# Patient Record
Sex: Female | Born: 1945 | Race: White | Hispanic: No | Marital: Married | Smoking: Former smoker
Health system: Southern US, Community
[De-identification: ages and names within clinical notes are randomized; demographics above are authoritative.]

## PROBLEM LIST (undated history)

## (undated) DIAGNOSIS — J449 Chronic obstructive pulmonary disease, unspecified: Secondary | ICD-10-CM

## (undated) DIAGNOSIS — E7800 Pure hypercholesterolemia, unspecified: Secondary | ICD-10-CM

## (undated) DIAGNOSIS — Z8719 Personal history of other diseases of the digestive system: Secondary | ICD-10-CM

## (undated) DIAGNOSIS — J302 Other seasonal allergic rhinitis: Secondary | ICD-10-CM

## (undated) DIAGNOSIS — H9319 Tinnitus, unspecified ear: Secondary | ICD-10-CM

## (undated) DIAGNOSIS — E039 Hypothyroidism, unspecified: Secondary | ICD-10-CM

## (undated) DIAGNOSIS — R069 Unspecified abnormalities of breathing: Secondary | ICD-10-CM

## (undated) DIAGNOSIS — I10 Essential (primary) hypertension: Secondary | ICD-10-CM

## (undated) DIAGNOSIS — R42 Dizziness and giddiness: Secondary | ICD-10-CM

## (undated) DIAGNOSIS — F419 Anxiety disorder, unspecified: Secondary | ICD-10-CM

## (undated) DIAGNOSIS — Z972 Presence of dental prosthetic device (complete) (partial): Secondary | ICD-10-CM

## (undated) DIAGNOSIS — J381 Polyp of vocal cord and larynx: Secondary | ICD-10-CM

## (undated) DIAGNOSIS — J439 Emphysema, unspecified: Secondary | ICD-10-CM

## (undated) DIAGNOSIS — K649 Unspecified hemorrhoids: Secondary | ICD-10-CM

## (undated) DIAGNOSIS — C3490 Malignant neoplasm of unspecified part of unspecified bronchus or lung: Secondary | ICD-10-CM

## (undated) DIAGNOSIS — I499 Cardiac arrhythmia, unspecified: Secondary | ICD-10-CM

## (undated) DIAGNOSIS — R002 Palpitations: Secondary | ICD-10-CM

## (undated) HISTORY — DX: Unspecified abnormalities of breathing: R06.9

## (undated) HISTORY — PX: OTHER SURGICAL HISTORY: SHX169

## (undated) HISTORY — DX: Malignant neoplasm of unspecified part of unspecified bronchus or lung: C34.90

## (undated) HISTORY — DX: Other seasonal allergic rhinitis: J30.2

## (undated) HISTORY — DX: Emphysema, unspecified: J43.9

## (undated) HISTORY — PX: ABDOMINAL HYSTERECTOMY: SHX81

---

## 2004-09-21 ENCOUNTER — Ambulatory Visit: Payer: Self-pay | Admitting: *Deleted

## 2005-11-04 ENCOUNTER — Ambulatory Visit: Payer: Self-pay | Admitting: Nurse Practitioner

## 2006-12-16 ENCOUNTER — Ambulatory Visit: Payer: Self-pay | Admitting: Nurse Practitioner

## 2008-01-09 ENCOUNTER — Ambulatory Visit: Payer: Self-pay | Admitting: Family Medicine

## 2008-01-23 ENCOUNTER — Ambulatory Visit: Payer: Self-pay | Admitting: Family Medicine

## 2009-01-21 ENCOUNTER — Ambulatory Visit: Payer: Self-pay | Admitting: Family Medicine

## 2010-02-05 ENCOUNTER — Ambulatory Visit: Payer: Self-pay | Admitting: Family Medicine

## 2011-02-08 ENCOUNTER — Other Ambulatory Visit: Payer: Self-pay | Admitting: Internal Medicine

## 2011-03-15 ENCOUNTER — Ambulatory Visit: Payer: Self-pay | Admitting: Internal Medicine

## 2012-03-15 ENCOUNTER — Ambulatory Visit: Payer: Self-pay | Admitting: Internal Medicine

## 2013-01-31 DIAGNOSIS — D485 Neoplasm of uncertain behavior of skin: Secondary | ICD-10-CM | POA: Diagnosis not present

## 2013-01-31 DIAGNOSIS — D239 Other benign neoplasm of skin, unspecified: Secondary | ICD-10-CM | POA: Diagnosis not present

## 2013-01-31 DIAGNOSIS — Z0189 Encounter for other specified special examinations: Secondary | ICD-10-CM | POA: Diagnosis not present

## 2013-01-31 DIAGNOSIS — L821 Other seborrheic keratosis: Secondary | ICD-10-CM | POA: Diagnosis not present

## 2013-01-31 DIAGNOSIS — D235 Other benign neoplasm of skin of trunk: Secondary | ICD-10-CM | POA: Diagnosis not present

## 2013-01-31 DIAGNOSIS — L819 Disorder of pigmentation, unspecified: Secondary | ICD-10-CM | POA: Diagnosis not present

## 2013-02-12 DIAGNOSIS — Z01419 Encounter for gynecological examination (general) (routine) without abnormal findings: Secondary | ICD-10-CM | POA: Diagnosis not present

## 2013-02-12 DIAGNOSIS — E785 Hyperlipidemia, unspecified: Secondary | ICD-10-CM | POA: Diagnosis not present

## 2013-02-12 DIAGNOSIS — I10 Essential (primary) hypertension: Secondary | ICD-10-CM | POA: Diagnosis not present

## 2013-03-16 ENCOUNTER — Ambulatory Visit: Payer: Self-pay | Admitting: Internal Medicine

## 2013-03-16 DIAGNOSIS — Z1231 Encounter for screening mammogram for malignant neoplasm of breast: Secondary | ICD-10-CM | POA: Diagnosis not present

## 2013-06-05 ENCOUNTER — Emergency Department: Payer: Self-pay | Admitting: Emergency Medicine

## 2013-06-05 DIAGNOSIS — S41109A Unspecified open wound of unspecified upper arm, initial encounter: Secondary | ICD-10-CM | POA: Diagnosis not present

## 2013-06-05 DIAGNOSIS — R079 Chest pain, unspecified: Secondary | ICD-10-CM | POA: Diagnosis not present

## 2013-06-05 DIAGNOSIS — Z043 Encounter for examination and observation following other accident: Secondary | ICD-10-CM | POA: Diagnosis not present

## 2013-06-05 DIAGNOSIS — S61409A Unspecified open wound of unspecified hand, initial encounter: Secondary | ICD-10-CM | POA: Diagnosis not present

## 2013-06-05 DIAGNOSIS — M7989 Other specified soft tissue disorders: Secondary | ICD-10-CM | POA: Diagnosis not present

## 2013-06-05 DIAGNOSIS — M79609 Pain in unspecified limb: Secondary | ICD-10-CM | POA: Diagnosis not present

## 2013-06-05 LAB — BASIC METABOLIC PANEL
Anion Gap: 9 (ref 7–16)
Co2: 26 mmol/L (ref 21–32)
EGFR (African American): >60
Osmolality: 263 (ref 275–301)
Potassium: 3 mmol/L — ABNORMAL LOW (ref 3.5–5.1)
Sodium: 131 mmol/L — ABNORMAL LOW (ref 136–145)

## 2013-06-05 LAB — CBC
HCT: 40 % (ref 35.0–47.0)
MCH: 32 pg (ref 26.0–34.0)
MCHC: 34.6 g/dL (ref 32.0–36.0)
Platelet: 303 10*3/uL (ref 150–440)
RBC: 4.33 10*6/uL (ref 3.80–5.20)
RDW: 14 % (ref 11.5–14.5)

## 2013-06-07 DIAGNOSIS — M25529 Pain in unspecified elbow: Secondary | ICD-10-CM | POA: Diagnosis not present

## 2013-06-14 DIAGNOSIS — S43499A Other sprain of unspecified shoulder joint, initial encounter: Secondary | ICD-10-CM | POA: Diagnosis not present

## 2013-06-14 DIAGNOSIS — Z23 Encounter for immunization: Secondary | ICD-10-CM | POA: Diagnosis not present

## 2013-06-14 DIAGNOSIS — M25519 Pain in unspecified shoulder: Secondary | ICD-10-CM | POA: Diagnosis not present

## 2013-06-14 DIAGNOSIS — IMO0002 Reserved for concepts with insufficient information to code with codable children: Secondary | ICD-10-CM | POA: Diagnosis not present

## 2013-07-13 DIAGNOSIS — IMO0002 Reserved for concepts with insufficient information to code with codable children: Secondary | ICD-10-CM | POA: Diagnosis not present

## 2014-01-24 DIAGNOSIS — R350 Frequency of micturition: Secondary | ICD-10-CM | POA: Diagnosis not present

## 2014-01-24 DIAGNOSIS — N3000 Acute cystitis without hematuria: Secondary | ICD-10-CM | POA: Diagnosis not present

## 2014-03-12 DIAGNOSIS — E785 Hyperlipidemia, unspecified: Secondary | ICD-10-CM | POA: Diagnosis not present

## 2014-03-12 DIAGNOSIS — I10 Essential (primary) hypertension: Secondary | ICD-10-CM | POA: Diagnosis not present

## 2014-03-12 DIAGNOSIS — J019 Acute sinusitis, unspecified: Secondary | ICD-10-CM | POA: Diagnosis not present

## 2014-04-19 ENCOUNTER — Ambulatory Visit: Payer: Self-pay | Admitting: Internal Medicine

## 2014-04-19 DIAGNOSIS — Z1231 Encounter for screening mammogram for malignant neoplasm of breast: Secondary | ICD-10-CM | POA: Diagnosis not present

## 2014-04-22 DIAGNOSIS — H9319 Tinnitus, unspecified ear: Secondary | ICD-10-CM | POA: Diagnosis not present

## 2014-04-22 DIAGNOSIS — H9120 Sudden idiopathic hearing loss, unspecified ear: Secondary | ICD-10-CM | POA: Diagnosis not present

## 2014-04-22 DIAGNOSIS — T65291A Toxic effect of other tobacco and nicotine, accidental (unintentional), initial encounter: Secondary | ICD-10-CM | POA: Diagnosis not present

## 2014-04-22 DIAGNOSIS — R490 Dysphonia: Secondary | ICD-10-CM | POA: Diagnosis not present

## 2014-04-22 DIAGNOSIS — H905 Unspecified sensorineural hearing loss: Secondary | ICD-10-CM | POA: Diagnosis not present

## 2014-04-22 DIAGNOSIS — J381 Polyp of vocal cord and larynx: Secondary | ICD-10-CM | POA: Diagnosis not present

## 2014-04-22 DIAGNOSIS — H6120 Impacted cerumen, unspecified ear: Secondary | ICD-10-CM | POA: Diagnosis not present

## 2014-04-23 DIAGNOSIS — F341 Dysthymic disorder: Secondary | ICD-10-CM | POA: Diagnosis not present

## 2014-04-23 DIAGNOSIS — E785 Hyperlipidemia, unspecified: Secondary | ICD-10-CM | POA: Diagnosis not present

## 2014-04-23 DIAGNOSIS — I10 Essential (primary) hypertension: Secondary | ICD-10-CM | POA: Diagnosis not present

## 2014-05-07 DIAGNOSIS — I10 Essential (primary) hypertension: Secondary | ICD-10-CM | POA: Diagnosis not present

## 2014-05-07 DIAGNOSIS — E785 Hyperlipidemia, unspecified: Secondary | ICD-10-CM | POA: Diagnosis not present

## 2014-05-07 DIAGNOSIS — F17200 Nicotine dependence, unspecified, uncomplicated: Secondary | ICD-10-CM | POA: Diagnosis not present

## 2014-05-13 DIAGNOSIS — R490 Dysphonia: Secondary | ICD-10-CM | POA: Diagnosis not present

## 2014-05-13 DIAGNOSIS — H905 Unspecified sensorineural hearing loss: Secondary | ICD-10-CM | POA: Diagnosis not present

## 2014-05-13 DIAGNOSIS — T65291A Toxic effect of other tobacco and nicotine, accidental (unintentional), initial encounter: Secondary | ICD-10-CM | POA: Diagnosis not present

## 2014-05-13 DIAGNOSIS — H9120 Sudden idiopathic hearing loss, unspecified ear: Secondary | ICD-10-CM | POA: Diagnosis not present

## 2014-05-30 DIAGNOSIS — H66003 Acute suppurative otitis media without spontaneous rupture of ear drum, bilateral: Secondary | ICD-10-CM | POA: Diagnosis not present

## 2014-05-30 DIAGNOSIS — F17200 Nicotine dependence, unspecified, uncomplicated: Secondary | ICD-10-CM | POA: Diagnosis not present

## 2014-05-30 DIAGNOSIS — J302 Other seasonal allergic rhinitis: Secondary | ICD-10-CM | POA: Diagnosis not present

## 2014-06-05 DIAGNOSIS — H903 Sensorineural hearing loss, bilateral: Secondary | ICD-10-CM | POA: Diagnosis not present

## 2014-06-05 DIAGNOSIS — R490 Dysphonia: Secondary | ICD-10-CM | POA: Diagnosis not present

## 2014-06-05 DIAGNOSIS — J309 Allergic rhinitis, unspecified: Secondary | ICD-10-CM | POA: Diagnosis not present

## 2014-06-05 DIAGNOSIS — H6982 Other specified disorders of Eustachian tube, left ear: Secondary | ICD-10-CM | POA: Diagnosis not present

## 2014-06-13 ENCOUNTER — Emergency Department: Payer: Self-pay | Admitting: Emergency Medicine

## 2014-06-13 DIAGNOSIS — Z720 Tobacco use: Secondary | ICD-10-CM | POA: Diagnosis not present

## 2014-06-13 DIAGNOSIS — I10 Essential (primary) hypertension: Secondary | ICD-10-CM | POA: Diagnosis not present

## 2014-06-13 DIAGNOSIS — H9312 Tinnitus, left ear: Secondary | ICD-10-CM | POA: Diagnosis not present

## 2014-06-13 DIAGNOSIS — H6502 Acute serous otitis media, left ear: Secondary | ICD-10-CM | POA: Diagnosis not present

## 2014-06-16 DIAGNOSIS — Z5181 Encounter for therapeutic drug level monitoring: Secondary | ICD-10-CM | POA: Diagnosis not present

## 2014-06-16 DIAGNOSIS — R450 Nervousness: Secondary | ICD-10-CM | POA: Diagnosis not present

## 2014-06-16 DIAGNOSIS — Z79899 Other long term (current) drug therapy: Secondary | ICD-10-CM | POA: Diagnosis not present

## 2014-06-16 DIAGNOSIS — H9313 Tinnitus, bilateral: Secondary | ICD-10-CM | POA: Diagnosis not present

## 2014-06-16 DIAGNOSIS — R000 Tachycardia, unspecified: Secondary | ICD-10-CM | POA: Diagnosis not present

## 2014-06-16 DIAGNOSIS — F419 Anxiety disorder, unspecified: Secondary | ICD-10-CM | POA: Diagnosis not present

## 2014-06-16 DIAGNOSIS — F17200 Nicotine dependence, unspecified, uncomplicated: Secondary | ICD-10-CM | POA: Diagnosis not present

## 2014-06-18 DIAGNOSIS — F17210 Nicotine dependence, cigarettes, uncomplicated: Secondary | ICD-10-CM | POA: Diagnosis not present

## 2014-06-18 DIAGNOSIS — R079 Chest pain, unspecified: Secondary | ICD-10-CM | POA: Diagnosis not present

## 2014-06-18 DIAGNOSIS — I10 Essential (primary) hypertension: Secondary | ICD-10-CM | POA: Diagnosis not present

## 2014-06-18 DIAGNOSIS — H9313 Tinnitus, bilateral: Secondary | ICD-10-CM | POA: Diagnosis not present

## 2014-06-18 DIAGNOSIS — H9319 Tinnitus, unspecified ear: Secondary | ICD-10-CM | POA: Diagnosis not present

## 2014-06-18 DIAGNOSIS — R0789 Other chest pain: Secondary | ICD-10-CM | POA: Diagnosis not present

## 2014-06-24 DIAGNOSIS — H9319 Tinnitus, unspecified ear: Secondary | ICD-10-CM | POA: Diagnosis not present

## 2014-07-01 DIAGNOSIS — H9319 Tinnitus, unspecified ear: Secondary | ICD-10-CM | POA: Diagnosis not present

## 2014-07-01 DIAGNOSIS — R079 Chest pain, unspecified: Secondary | ICD-10-CM | POA: Diagnosis not present

## 2014-07-01 DIAGNOSIS — I10 Essential (primary) hypertension: Secondary | ICD-10-CM | POA: Diagnosis not present

## 2014-07-01 DIAGNOSIS — I471 Supraventricular tachycardia: Secondary | ICD-10-CM | POA: Diagnosis not present

## 2014-07-02 DIAGNOSIS — Z23 Encounter for immunization: Secondary | ICD-10-CM | POA: Diagnosis not present

## 2014-07-15 DIAGNOSIS — F411 Generalized anxiety disorder: Secondary | ICD-10-CM | POA: Diagnosis not present

## 2014-07-15 DIAGNOSIS — H9312 Tinnitus, left ear: Secondary | ICD-10-CM | POA: Diagnosis not present

## 2014-07-24 DIAGNOSIS — I471 Supraventricular tachycardia: Secondary | ICD-10-CM | POA: Diagnosis not present

## 2014-07-24 DIAGNOSIS — I10 Essential (primary) hypertension: Secondary | ICD-10-CM | POA: Diagnosis not present

## 2014-07-24 DIAGNOSIS — H9319 Tinnitus, unspecified ear: Secondary | ICD-10-CM | POA: Diagnosis not present

## 2014-08-28 DIAGNOSIS — I471 Supraventricular tachycardia: Secondary | ICD-10-CM | POA: Diagnosis not present

## 2014-08-28 DIAGNOSIS — I10 Essential (primary) hypertension: Secondary | ICD-10-CM | POA: Diagnosis not present

## 2014-08-28 DIAGNOSIS — H9319 Tinnitus, unspecified ear: Secondary | ICD-10-CM | POA: Diagnosis not present

## 2014-09-11 DIAGNOSIS — F17200 Nicotine dependence, unspecified, uncomplicated: Secondary | ICD-10-CM | POA: Diagnosis not present

## 2014-09-11 DIAGNOSIS — R490 Dysphonia: Secondary | ICD-10-CM | POA: Diagnosis not present

## 2014-09-11 DIAGNOSIS — H903 Sensorineural hearing loss, bilateral: Secondary | ICD-10-CM | POA: Diagnosis not present

## 2015-04-02 DIAGNOSIS — F17210 Nicotine dependence, cigarettes, uncomplicated: Secondary | ICD-10-CM | POA: Diagnosis not present

## 2015-04-02 DIAGNOSIS — R490 Dysphonia: Secondary | ICD-10-CM | POA: Diagnosis not present

## 2015-04-07 DIAGNOSIS — I471 Supraventricular tachycardia: Secondary | ICD-10-CM | POA: Diagnosis not present

## 2015-04-07 DIAGNOSIS — H81319 Aural vertigo, unspecified ear: Secondary | ICD-10-CM | POA: Diagnosis not present

## 2015-04-07 DIAGNOSIS — E784 Other hyperlipidemia: Secondary | ICD-10-CM | POA: Diagnosis not present

## 2015-04-09 ENCOUNTER — Other Ambulatory Visit: Payer: Self-pay | Admitting: Internal Medicine

## 2015-04-09 DIAGNOSIS — Z1231 Encounter for screening mammogram for malignant neoplasm of breast: Secondary | ICD-10-CM

## 2015-04-21 ENCOUNTER — Other Ambulatory Visit: Payer: Self-pay | Admitting: Internal Medicine

## 2015-04-21 ENCOUNTER — Ambulatory Visit
Admission: RE | Admit: 2015-04-21 | Discharge: 2015-04-21 | Disposition: A | Discharge status: Home / Self Care | Payer: Medicare Other | Source: Ambulatory Visit | Attending: Internal Medicine | Admitting: Internal Medicine

## 2015-04-21 DIAGNOSIS — Z1231 Encounter for screening mammogram for malignant neoplasm of breast: Secondary | ICD-10-CM | POA: Insufficient documentation

## 2015-06-13 DIAGNOSIS — Z23 Encounter for immunization: Secondary | ICD-10-CM | POA: Diagnosis not present

## 2015-07-24 DIAGNOSIS — I10 Essential (primary) hypertension: Secondary | ICD-10-CM | POA: Diagnosis not present

## 2015-07-24 DIAGNOSIS — E784 Other hyperlipidemia: Secondary | ICD-10-CM | POA: Diagnosis not present

## 2015-07-24 DIAGNOSIS — R5381 Other malaise: Secondary | ICD-10-CM | POA: Diagnosis not present

## 2015-08-01 DIAGNOSIS — I471 Supraventricular tachycardia: Secondary | ICD-10-CM | POA: Diagnosis not present

## 2015-08-01 DIAGNOSIS — E784 Other hyperlipidemia: Secondary | ICD-10-CM | POA: Diagnosis not present

## 2015-08-01 DIAGNOSIS — I10 Essential (primary) hypertension: Secondary | ICD-10-CM | POA: Diagnosis not present

## 2015-10-13 DIAGNOSIS — R1310 Dysphagia, unspecified: Secondary | ICD-10-CM | POA: Diagnosis not present

## 2015-10-13 DIAGNOSIS — T65222A Toxic effect of tobacco cigarettes, intentional self-harm, initial encounter: Secondary | ICD-10-CM | POA: Diagnosis not present

## 2015-12-31 DIAGNOSIS — I471 Supraventricular tachycardia: Secondary | ICD-10-CM | POA: Diagnosis not present

## 2015-12-31 DIAGNOSIS — E784 Other hyperlipidemia: Secondary | ICD-10-CM | POA: Diagnosis not present

## 2016-01-01 ENCOUNTER — Telehealth: Payer: Self-pay | Admitting: Gastroenterology

## 2016-01-01 NOTE — Telephone Encounter (Signed)
colonoscopy

## 2016-01-08 ENCOUNTER — Other Ambulatory Visit: Payer: Self-pay

## 2016-01-08 MED ORDER — PEG 3350-KCL-NABCB-NACL-NASULF 236 G PO SOLR: 4000.0000 mL | Freq: Once | ORAL | Status: DC

## 2016-01-08 NOTE — Telephone Encounter (Signed)
Pt scheduled for a screening colonoscopy at [facility] on 01/22/16. Instructs/rx mailed. Please precert.

## 2016-01-08 NOTE — Telephone Encounter (Signed)
Gastroenterology Pre-Procedure Review  Request Date: 01/22/16 Requesting Physician: Dr. Lavera Guise  PATIENT REVIEW QUESTIONS: The patient responded to the following health history questions as indicated:    1. Are you having any GI issues? no 2. Do you have a personal history of Polyps? no 3. Do you have a family history of Colon Cancer or Polyps? no 4. Diabetes Mellitus? no 5. Joint replacements in the past 12 months?no 6. Major health problems in the past 3 months?no 7. Any artificial heart valves, MVP, or defibrillator?no    MEDICATIONS & ALLERGIES:    Patient reports the following regarding taking any anticoagulation/antiplatelet therapy:   Plavix, Coumadin, Eliquis, Xarelto, Lovenox, Pradaxa, Brilinta, or Effient? no Aspirin? no  Patient confirms/reports the following medications:  No current outpatient prescriptions on file.   No current facility-administered medications for this visit.    Patient confirms/reports the following allergies:  Allergies not on file  No orders of the defined types were placed in this encounter.    AUTHORIZATION INFORMATION Primary Insurance: 1D#: Group #:  Secondary Insurance: 1D#: Group #:  SCHEDULE INFORMATION: Date: 01/22/16 Time: Location: [facility]

## 2016-01-14 ENCOUNTER — Encounter: Payer: Self-pay | Admitting: *Deleted

## 2016-01-21 ENCOUNTER — Telehealth: Payer: Self-pay | Admitting: Gastroenterology

## 2016-01-21 NOTE — Telephone Encounter (Signed)
Patient has a question regarding taking medication the day of her colonoscopy which is tomorrow. Please call

## 2016-01-21 NOTE — Discharge Instructions (Signed)

## 2016-01-22 ENCOUNTER — Encounter
Admission: RE | Disposition: A | Discharge status: Home / Self Care | Payer: Self-pay | Source: Ambulatory Visit | Attending: Gastroenterology

## 2016-01-22 ENCOUNTER — Ambulatory Visit: Payer: Medicare Other | Admitting: Anesthesiology

## 2016-01-22 ENCOUNTER — Ambulatory Visit
Admission: RE | Admit: 2016-01-22 | Discharge: 2016-01-22 | Disposition: A | Discharge status: Home / Self Care | Payer: Medicare Other | Source: Ambulatory Visit | Attending: Gastroenterology | Admitting: Gastroenterology

## 2016-01-22 DIAGNOSIS — E039 Hypothyroidism, unspecified: Secondary | ICD-10-CM | POA: Insufficient documentation

## 2016-01-22 DIAGNOSIS — E7800 Pure hypercholesterolemia, unspecified: Secondary | ICD-10-CM | POA: Insufficient documentation

## 2016-01-22 DIAGNOSIS — Z79899 Other long term (current) drug therapy: Secondary | ICD-10-CM | POA: Insufficient documentation

## 2016-01-22 DIAGNOSIS — K648 Other hemorrhoids: Secondary | ICD-10-CM | POA: Insufficient documentation

## 2016-01-22 DIAGNOSIS — D120 Benign neoplasm of cecum: Secondary | ICD-10-CM | POA: Diagnosis not present

## 2016-01-22 DIAGNOSIS — K641 Second degree hemorrhoids: Secondary | ICD-10-CM | POA: Insufficient documentation

## 2016-01-22 DIAGNOSIS — D125 Benign neoplasm of sigmoid colon: Secondary | ICD-10-CM | POA: Diagnosis not present

## 2016-01-22 DIAGNOSIS — D122 Benign neoplasm of ascending colon: Secondary | ICD-10-CM | POA: Diagnosis not present

## 2016-01-22 DIAGNOSIS — I499 Cardiac arrhythmia, unspecified: Secondary | ICD-10-CM | POA: Insufficient documentation

## 2016-01-22 DIAGNOSIS — R002 Palpitations: Secondary | ICD-10-CM | POA: Diagnosis not present

## 2016-01-22 DIAGNOSIS — F419 Anxiety disorder, unspecified: Secondary | ICD-10-CM | POA: Diagnosis not present

## 2016-01-22 DIAGNOSIS — Z90710 Acquired absence of both cervix and uterus: Secondary | ICD-10-CM | POA: Diagnosis not present

## 2016-01-22 DIAGNOSIS — I10 Essential (primary) hypertension: Secondary | ICD-10-CM | POA: Diagnosis not present

## 2016-01-22 DIAGNOSIS — K635 Polyp of colon: Secondary | ICD-10-CM | POA: Diagnosis not present

## 2016-01-22 DIAGNOSIS — Z885 Allergy status to narcotic agent status: Secondary | ICD-10-CM | POA: Insufficient documentation

## 2016-01-22 DIAGNOSIS — K5730 Diverticulosis of large intestine without perforation or abscess without bleeding: Secondary | ICD-10-CM | POA: Diagnosis not present

## 2016-01-22 DIAGNOSIS — Z803 Family history of malignant neoplasm of breast: Secondary | ICD-10-CM | POA: Insufficient documentation

## 2016-01-22 DIAGNOSIS — Z1211 Encounter for screening for malignant neoplasm of colon: Secondary | ICD-10-CM | POA: Insufficient documentation

## 2016-01-22 DIAGNOSIS — F17210 Nicotine dependence, cigarettes, uncomplicated: Secondary | ICD-10-CM | POA: Insufficient documentation

## 2016-01-22 HISTORY — PX: POLYPECTOMY: SHX5525

## 2016-01-22 HISTORY — DX: Cardiac arrhythmia, unspecified: I49.9

## 2016-01-22 HISTORY — PX: COLONOSCOPY WITH PROPOFOL: SHX5780

## 2016-01-22 HISTORY — DX: Unspecified hemorrhoids: K64.9

## 2016-01-22 HISTORY — DX: Essential (primary) hypertension: I10

## 2016-01-22 HISTORY — DX: Dizziness and giddiness: R42

## 2016-01-22 HISTORY — DX: Presence of dental prosthetic device (complete) (partial): Z97.2

## 2016-01-22 HISTORY — DX: Pure hypercholesterolemia, unspecified: E78.00

## 2016-01-22 HISTORY — DX: Palpitations: R00.2

## 2016-01-22 HISTORY — DX: Hypothyroidism, unspecified: E03.9

## 2016-01-22 HISTORY — DX: Anxiety disorder, unspecified: F41.9

## 2016-01-22 SURGERY — COLONOSCOPY WITH PROPOFOL
Anesthesia: Monitor Anesthesia Care | Wound class: Contaminated

## 2016-01-22 MED ORDER — LIDOCAINE HCL (CARDIAC) 20 MG/ML IV SOLN
INTRAVENOUS | Status: DC | PRN
  Administered 2016-01-22: 40 mg via INTRAVENOUS

## 2016-01-22 MED ORDER — PROPOFOL 10 MG/ML IV BOLUS
INTRAVENOUS | Status: DC | PRN
  Administered 2016-01-22: 20 mg via INTRAVENOUS
  Administered 2016-01-22: 10 mg via INTRAVENOUS
  Administered 2016-01-22: 20 mg via INTRAVENOUS
  Administered 2016-01-22 (×2): 10 mg via INTRAVENOUS
  Administered 2016-01-22: 20 mg via INTRAVENOUS
  Administered 2016-01-22 (×3): 10 mg via INTRAVENOUS
  Administered 2016-01-22 (×3): 20 mg via INTRAVENOUS
  Administered 2016-01-22 (×2): 10 mg via INTRAVENOUS
  Administered 2016-01-22: 60 mg via INTRAVENOUS
  Administered 2016-01-22 (×2): 20 mg via INTRAVENOUS

## 2016-01-22 MED ORDER — LACTATED RINGERS IV SOLN
INTRAVENOUS | Status: DC
  Administered 2016-01-22 (×2): via INTRAVENOUS

## 2016-01-22 MED ORDER — STERILE WATER FOR IRRIGATION IR SOLN
Status: DC | PRN
  Administered 2016-01-22: 10:00:00

## 2016-01-22 SURGICAL SUPPLY — 23 items
CANISTER SUCT 1200ML W/VALVE (MISCELLANEOUS) ×4 IMPLANT
CLIP HMST 235XBRD CATH ROT (MISCELLANEOUS) ×6 IMPLANT
CLIP RESOLUTION 360 11X235 (MISCELLANEOUS) ×6
FCP ESCP3.2XJMB 240X2.8X (MISCELLANEOUS)
FORCEPS BIOP RAD 4 LRG CAP 4 (CUTTING FORCEPS) IMPLANT
FORCEPS BIOP RJ4 240 W/NDL (MISCELLANEOUS)
FORCEPS ESCP3.2XJMB 240X2.8X (MISCELLANEOUS) IMPLANT
GOWN CVR UNV OPN BCK APRN NK (MISCELLANEOUS) ×4 IMPLANT
GOWN ISOL THUMB LOOP REG UNIV (MISCELLANEOUS) ×4
INJECTOR VARIJECT VIN23 (MISCELLANEOUS) IMPLANT
KIT DEFENDO VALVE AND CONN (KITS) IMPLANT
KIT ENDO PROCEDURE OLY (KITS) ×4 IMPLANT
MARKER SPOT ENDO TATTOO 5ML (MISCELLANEOUS) IMPLANT
PAD GROUND ADULT SPLIT (MISCELLANEOUS) ×4 IMPLANT
PROBE APC STR FIRE (PROBE) IMPLANT
RETRIEVER NET ROTH 2.5X230 LF (MISCELLANEOUS) ×4 IMPLANT
SNARE SHORT THROW 13M SML OVAL (MISCELLANEOUS) ×4 IMPLANT
SNARE SHORT THROW 30M LRG OVAL (MISCELLANEOUS) IMPLANT
SNARE SNG USE RND 15MM (INSTRUMENTS) IMPLANT
SPOT EX ENDOSCOPIC TATTOO (MISCELLANEOUS)
TRAP ETRAP POLY (MISCELLANEOUS) ×4 IMPLANT
VARIJECT INJECTOR VIN23 (MISCELLANEOUS)
WATER STERILE IRR 250ML POUR (IV SOLUTION) ×4 IMPLANT

## 2016-01-22 NOTE — Transfer of Care (Signed)
Immediate Anesthesia Transfer of Care Note  Patient: Beth Mcmillan  Procedure(s) Performed: Procedure(s): COLONOSCOPY WITH PROPOFOL (N/A) POLYPECTOMY  Patient Location: PACU  Anesthesia Type: MAC  Level of Consciousness: awake, alert  and patient cooperative  Airway and Oxygen Therapy: Patient Spontanous Breathing and Patient connected to supplemental oxygen  Post-op Assessment: Post-op Vital signs reviewed, Patient's Cardiovascular Status Stable, Respiratory Function Stable, Patent Airway and No signs of Nausea or vomiting  Post-op Vital Signs: Reviewed and stable  Complications: No apparent anesthesia complications

## 2016-01-22 NOTE — Anesthesia Procedure Notes (Signed)
Procedure Name: MAC Performed by: Yorley Buch Pre-anesthesia Checklist: Patient identified, Emergency Drugs available, Suction available, Patient being monitored and Timeout performed Patient Re-evaluated:Patient Re-evaluated prior to inductionOxygen Delivery Method: Nasal cannula       

## 2016-01-22 NOTE — H&P (Signed)
[facility]  [address] [place], [place] [address] Phone: [phone] Fax : [phone]  Primary Care Physician:  No primary care provider on file. Primary Gastroenterologist:  Dr. Allen Norris  Pre-Procedure History & Physical: HPI:  Beth Mcmillan is a 70 y.o. female is here for a screening colonoscopy.   Past Medical History  Diagnosis Date  . Hypertension   . Dysrhythmia   . Palpitations     with anxiety  . Anxiety   . Hypothyroidism   . Vertigo     no episodes in several years  . Wears dentures     partial lower  . Hypercholesteremia   . Hemorrhoid     Past Surgical History  Procedure Laterality Date  . Abdominal hysterectomy      only had spinal    Prior to Admission medications   Medication Sig Start Date End Date Taking? Authorizing Provider  ALPRAZolam (XANAX) 0.25 MG tablet Take 0.25 mg by mouth 2 (two) times daily as needed for anxiety.   Yes Historical Provider, MD  Ascorbic Acid (VITAMIN C) 500 MG CHEW Chew by mouth.   Yes Historical Provider, MD  atorvastatin (LIPITOR) 10 MG tablet  06/17/14  Yes Historical Provider, MD  benazepril-hydrochlorthiazide (LOTENSIN HCT) 20-12.5 MG tablet Take 1 tablet by mouth daily.   Yes Historical Provider, MD  Cholecalciferol (VITAMIN D PO) Take by mouth daily.   Yes Historical Provider, MD  levothyroxine (SYNTHROID, LEVOTHROID) 50 MCG tablet Take 50 mcg by mouth daily before breakfast.   Yes Historical Provider, MD  metoprolol succinate (TOPROL-XL) 25 MG 24 hr tablet  07/01/14  Yes Historical Provider, MD  VITAMIN E PO Take by mouth daily.   Yes Historical Provider, MD  loratadine (CLARITIN) 10 MG tablet Take by mouth daily as needed. Reported on 01/22/2016    Historical Provider, MD  polyethylene glycol (GOLYTELY) 236 g solution Take 4,000 mLs by mouth once. Drink one 8 oz glass every 30 mins until stools are clear. 01/08/16   Lucilla Lame, MD    Allergies as of 01/08/2016 - Review Complete 01/08/2016    Allergen Reaction Noted  . Codeine Nausea Only 01/08/2016    Family History  Problem Relation Age of Onset  . Breast cancer Neg Hx     Social History   Social History  . Marital Status: Married    Spouse Name: N/A  . Number of Children: N/A  . Years of Education: N/A   Occupational History  . Not on file.   Social History Main Topics  . Smoking status: Current Every Day Smoker -- 0.50 packs/day for 50 years    Types: Cigarettes  . Smokeless tobacco: Not on file  . Alcohol Use: No  . Drug Use: Not on file  . Sexual Activity: Not on file   Other Topics Concern  . Not on file   Social History Narrative    Review of Systems: See HPI, otherwise negative ROS  Physical Exam: BP 148/70 mmHg  Pulse 104  Temp(Src) 98.2 F (36.8 C) (Temporal)  Ht '5\' 7"'$  (1.702 m)  Wt 140 lb (63.504 kg)  BMI 21.92 kg/m2  SpO2 100% General:   Alert,  pleasant and cooperative in NAD Head:  Normocephalic and atraumatic. Neck:  Supple; no masses or thyromegaly. Lungs:  Clear throughout to auscultation.    Heart:  Regular rate and rhythm. Abdomen:  Soft, nontender and nondistended. Normal bowel sounds, without guarding, and without rebound.   Neurologic:  Alert and  oriented x4;  grossly normal neurologically.  Impression/Plan: Beth Mcmillan is now here to undergo a screening colonoscopy.  Risks, benefits, and alternatives regarding colonoscopy have been reviewed with the patient.  Questions have been answered.  All parties agreeable.

## 2016-01-22 NOTE — Anesthesia Preprocedure Evaluation (Signed)
Anesthesia Evaluation  Patient identified by MRN, date of birth, ID band Patient awake    Reviewed: Allergy & Precautions, NPO status , Patient's Chart, lab work & pertinent test results  Airway Mallampati: II  TM Distance: >3 FB Neck ROM: Full    Dental no notable dental hx.    Pulmonary Current Smoker,    Pulmonary exam normal breath sounds clear to auscultation       Cardiovascular hypertension, negative cardio ROS Normal cardiovascular exam Rhythm:Regular Rate:Normal  Anxiety related palpitations.   Neuro/Psych negative neurological ROS  negative psych ROS   GI/Hepatic negative GI ROS, Neg liver ROS,   Endo/Other  Hypothyroidism   Renal/GU negative Renal ROS  negative genitourinary   Musculoskeletal negative musculoskeletal ROS (+)   Abdominal   Peds negative pediatric ROS (+)  Hematology negative hematology ROS (+)   Anesthesia Other Findings   Reproductive/Obstetrics negative OB ROS                             Anesthesia Physical Anesthesia Plan  ASA: II  Anesthesia Plan: MAC   Post-op Pain Management:    Induction: Intravenous  Airway Management Planned:   Additional Equipment:   Intra-op Plan:   Post-operative Plan: Extubation in OR  Informed Consent: I have reviewed the patients History and Physical, chart, labs and discussed the procedure including the risks, benefits and alternatives for the proposed anesthesia with the patient or authorized representative who has indicated his/her understanding and acceptance.   Dental advisory given  Plan Discussed with: CRNA  Anesthesia Plan Comments:         Anesthesia Quick Evaluation

## 2016-01-22 NOTE — Op Note (Signed)
[facility] Gastroenterology Patient Name: Beth Mcmillan Procedure Date: 01/22/2016 8:44 AM MRN: [id number] Account #: [id number] Date of Birth: [birth year] Admit Type: Outpatient Age: 70 Room: [facility] OR ROOM 01 Gender: Female Note Status: Finalized Procedure:            Colonoscopy Indications:          Screening for colorectal malignant neoplasm Providers:            Lucilla Lame, MD Referring MD:         Cletis Athens, MD (Referring MD) Medicines:            Propofol per Anesthesia Complications:        No immediate complications. Procedure:            Pre-Anesthesia Assessment:                       - Prior to the procedure, a History and Physical was                        performed, and patient medications and allergies were                        reviewed. The patient's tolerance of previous                        anesthesia was also reviewed. The risks and benefits of                        the procedure and the sedation options and risks were                        discussed with the patient. All questions were                        answered, and informed consent was obtained. Prior                        Anticoagulants: The patient has taken no previous                        anticoagulant or antiplatelet agents. ASA Grade                        Assessment: II - A patient with mild systemic disease.                        After reviewing the risks and benefits, the patient was                        deemed in satisfactory condition to undergo the                        procedure.                       After obtaining informed consent, the colonoscope was                        passed under direct vision. Throughout the procedure,  the patient's blood pressure, pulse, and oxygen                        saturations were monitored continuously. The Olympus                        CF-HQ190L Colonoscope (S#. [id number]) was introduced               through the anus and advanced to the the cecum,                        identified by appendiceal orifice and ileocecal valve.                        The colonoscopy was performed without difficulty. The                        patient tolerated the procedure well. The quality of                        the bowel preparation was excellent. Findings:      The perianal and digital rectal examinations were normal.      A 4 mm polyp was found in the cecum. The polyp was sessile. The polyp       was removed with a cold snare. Resection and retrieval were complete.      A 10 mm polyp was found in the ascending colon. The polyp was sessile.       The polyp was removed with a hot snare. Resection and retrieval were       complete. To prevent bleeding post-intervention, three hemostatic clips       were successfully placed (MR conditional). There was no bleeding at the       end of the procedure.      A 5 mm polyp was found in the sigmoid colon. The polyp was sessile. The       polyp was removed with a cold snare. Resection and retrieval were       complete.      Three sessile polyps were found in the sigmoid colon. The polyps were 4       to 7 mm in size. These polyps were removed with a cold snare. Resection       and retrieval were complete.      Non-bleeding internal hemorrhoids were found during retroflexion. The       hemorrhoids were Grade II (internal hemorrhoids that prolapse but reduce       spontaneously).      Multiple small-mouthed diverticula were found in the sigmoid colon. Impression:           - One 4 mm polyp in the cecum, removed with a cold                        snare. Resected and retrieved.                       - One 10 mm polyp in the ascending colon, removed with                        a hot snare. Resected and retrieved. Clips (MR  conditional) were placed.                       - One 5 mm polyp in the sigmoid colon, removed with a                         cold snare. Resected and retrieved.                       - Three 4 to 7 mm polyps in the sigmoid colon, removed                        with a cold snare. Resected and retrieved.                       - Non-bleeding internal hemorrhoids.                       - Diverticulosis in the sigmoid colon. Recommendation:       - Await pathology results.                       - Repeat colonoscopy in 3 years if polyp adenoma and 10                        years if hyperplastic Procedure Code(s):    --- Professional ---                       [phone], Colonoscopy, flexible; with removal of tumor(s),                        polyp(s), or other lesion(s) by snare technique Diagnosis Code(s):    --- Professional ---                       Z12.11, Encounter for screening for malignant neoplasm                        of colon                       D12.0, Benign neoplasm of cecum                       D12.2, Benign neoplasm of ascending colon CPT copyright 2016 American Medical Association. All rights reserved. The codes documented in this report are preliminary and upon coder review may  be revised to meet current compliance requirements. Lucilla Lame, MD 01/22/2016 10:11:00 AM This report has been signed electronically. Number of Addenda: 0 Note Initiated On: 01/22/2016 8:44 AM Scope Withdrawal Time: 0 hours 18 minutes 1 second  Total Procedure Duration: 0 hours 25 minutes 47 seconds       [facility]

## 2016-01-22 NOTE — Anesthesia Postprocedure Evaluation (Signed)
Anesthesia Post Note  Patient: Beth Mcmillan  Procedure(s) Performed: Procedure(s) (LRB): COLONOSCOPY WITH PROPOFOL (N/A) POLYPECTOMY  Patient location during evaluation: PACU Anesthesia Type: MAC Level of consciousness: awake and alert Pain management: pain level controlled Vital Signs Assessment: post-procedure vital signs reviewed and stable Respiratory status: spontaneous breathing, nonlabored ventilation, respiratory function stable and patient connected to nasal cannula oxygen Cardiovascular status: stable and blood pressure returned to baseline Anesthetic complications: no    Starasia Sinko C

## 2016-01-23 ENCOUNTER — Encounter: Payer: Self-pay | Admitting: Gastroenterology

## 2016-01-27 ENCOUNTER — Encounter: Payer: Self-pay | Admitting: Gastroenterology

## 2016-01-28 ENCOUNTER — Telehealth: Payer: Self-pay | Admitting: Gastroenterology

## 2016-01-28 NOTE — Telephone Encounter (Signed)
Patient waiting to get colonoscopy results

## 2016-01-28 NOTE — Telephone Encounter (Signed)
Pt notified of colonoscopy results.  

## 2016-03-01 DIAGNOSIS — I471 Supraventricular tachycardia: Secondary | ICD-10-CM | POA: Diagnosis not present

## 2016-03-01 DIAGNOSIS — I10 Essential (primary) hypertension: Secondary | ICD-10-CM | POA: Diagnosis not present

## 2016-03-01 DIAGNOSIS — E784 Other hyperlipidemia: Secondary | ICD-10-CM | POA: Diagnosis not present

## 2016-04-02 ENCOUNTER — Other Ambulatory Visit: Payer: Self-pay | Admitting: Internal Medicine

## 2016-04-02 DIAGNOSIS — Z1231 Encounter for screening mammogram for malignant neoplasm of breast: Secondary | ICD-10-CM

## 2016-04-12 DIAGNOSIS — R1310 Dysphagia, unspecified: Secondary | ICD-10-CM | POA: Diagnosis not present

## 2016-04-12 DIAGNOSIS — T65222A Toxic effect of tobacco cigarettes, intentional self-harm, initial encounter: Secondary | ICD-10-CM | POA: Diagnosis not present

## 2016-04-14 DIAGNOSIS — L821 Other seborrheic keratosis: Secondary | ICD-10-CM | POA: Diagnosis not present

## 2016-04-14 DIAGNOSIS — D485 Neoplasm of uncertain behavior of skin: Secondary | ICD-10-CM | POA: Diagnosis not present

## 2016-04-14 DIAGNOSIS — L280 Lichen simplex chronicus: Secondary | ICD-10-CM | POA: Diagnosis not present

## 2016-04-19 DIAGNOSIS — Z1283 Encounter for screening for malignant neoplasm of skin: Secondary | ICD-10-CM | POA: Diagnosis not present

## 2016-04-19 DIAGNOSIS — L819 Disorder of pigmentation, unspecified: Secondary | ICD-10-CM | POA: Diagnosis not present

## 2016-04-19 DIAGNOSIS — L280 Lichen simplex chronicus: Secondary | ICD-10-CM | POA: Diagnosis not present

## 2016-04-21 DIAGNOSIS — Z23 Encounter for immunization: Secondary | ICD-10-CM | POA: Diagnosis not present

## 2016-04-23 ENCOUNTER — Other Ambulatory Visit: Payer: Self-pay | Admitting: Internal Medicine

## 2016-04-23 ENCOUNTER — Ambulatory Visit
Admission: RE | Admit: 2016-04-23 | Discharge: 2016-04-23 | Disposition: A | Discharge status: Home / Self Care | Payer: Medicare Other | Source: Ambulatory Visit | Attending: Internal Medicine | Admitting: Internal Medicine

## 2016-04-23 DIAGNOSIS — Z1231 Encounter for screening mammogram for malignant neoplasm of breast: Secondary | ICD-10-CM

## 2016-05-19 DIAGNOSIS — L280 Lichen simplex chronicus: Secondary | ICD-10-CM | POA: Diagnosis not present

## 2016-08-26 DIAGNOSIS — I10 Essential (primary) hypertension: Secondary | ICD-10-CM | POA: Diagnosis not present

## 2016-08-26 DIAGNOSIS — I471 Supraventricular tachycardia: Secondary | ICD-10-CM | POA: Diagnosis not present

## 2016-08-26 DIAGNOSIS — E784 Other hyperlipidemia: Secondary | ICD-10-CM | POA: Diagnosis not present

## 2016-08-26 DIAGNOSIS — F17210 Nicotine dependence, cigarettes, uncomplicated: Secondary | ICD-10-CM | POA: Diagnosis not present

## 2016-10-13 DIAGNOSIS — F17200 Nicotine dependence, unspecified, uncomplicated: Secondary | ICD-10-CM | POA: Diagnosis not present

## 2016-10-13 DIAGNOSIS — R1310 Dysphagia, unspecified: Secondary | ICD-10-CM | POA: Diagnosis not present

## 2016-10-26 DIAGNOSIS — R5381 Other malaise: Secondary | ICD-10-CM | POA: Diagnosis not present

## 2016-10-26 DIAGNOSIS — E784 Other hyperlipidemia: Secondary | ICD-10-CM | POA: Diagnosis not present

## 2016-10-26 DIAGNOSIS — I10 Essential (primary) hypertension: Secondary | ICD-10-CM | POA: Diagnosis not present

## 2017-01-26 DIAGNOSIS — R309 Painful micturition, unspecified: Secondary | ICD-10-CM | POA: Diagnosis not present

## 2017-01-26 DIAGNOSIS — I10 Essential (primary) hypertension: Secondary | ICD-10-CM | POA: Diagnosis not present

## 2017-01-26 DIAGNOSIS — I471 Supraventricular tachycardia: Secondary | ICD-10-CM | POA: Diagnosis not present

## 2017-01-26 DIAGNOSIS — N3000 Acute cystitis without hematuria: Secondary | ICD-10-CM | POA: Diagnosis not present

## 2017-04-13 DIAGNOSIS — F17200 Nicotine dependence, unspecified, uncomplicated: Secondary | ICD-10-CM | POA: Diagnosis not present

## 2017-04-13 DIAGNOSIS — R1310 Dysphagia, unspecified: Secondary | ICD-10-CM | POA: Diagnosis not present

## 2017-04-18 ENCOUNTER — Other Ambulatory Visit: Payer: Self-pay | Admitting: Internal Medicine

## 2017-04-18 DIAGNOSIS — Z1231 Encounter for screening mammogram for malignant neoplasm of breast: Secondary | ICD-10-CM

## 2017-04-20 DIAGNOSIS — Z808 Family history of malignant neoplasm of other organs or systems: Secondary | ICD-10-CM | POA: Diagnosis not present

## 2017-04-20 DIAGNOSIS — L565 Disseminated superficial actinic porokeratosis (DSAP): Secondary | ICD-10-CM | POA: Diagnosis not present

## 2017-05-03 ENCOUNTER — Telehealth: Payer: Self-pay | Admitting: *Deleted

## 2017-05-03 NOTE — Telephone Encounter (Signed)
Received referral for low dose lung cancer screening CT scan. Message left at phone number listed in EMR for patient to call me back to facilitate scheduling scan.  

## 2017-05-04 ENCOUNTER — Ambulatory Visit
Admission: RE | Admit: 2017-05-04 | Discharge: 2017-05-04 | Disposition: A | Discharge status: Home / Self Care | Payer: Medicare Other | Source: Ambulatory Visit | Attending: Internal Medicine | Admitting: Internal Medicine

## 2017-05-04 DIAGNOSIS — Z1231 Encounter for screening mammogram for malignant neoplasm of breast: Secondary | ICD-10-CM | POA: Diagnosis not present

## 2017-05-18 ENCOUNTER — Telehealth: Payer: Self-pay | Admitting: *Deleted

## 2017-05-18 NOTE — Telephone Encounter (Signed)
Received referral for low dose lung cancer screening CT scan. Message left at phone number listed in EMR for patient to call me back to facilitate scheduling scan.  

## 2017-07-12 ENCOUNTER — Encounter: Payer: Self-pay | Admitting: *Deleted

## 2017-10-17 DIAGNOSIS — R1310 Dysphagia, unspecified: Secondary | ICD-10-CM | POA: Diagnosis not present

## 2017-10-17 DIAGNOSIS — F17200 Nicotine dependence, unspecified, uncomplicated: Secondary | ICD-10-CM | POA: Diagnosis not present

## 2017-11-22 DIAGNOSIS — R5381 Other malaise: Secondary | ICD-10-CM | POA: Diagnosis not present

## 2017-11-22 DIAGNOSIS — I10 Essential (primary) hypertension: Secondary | ICD-10-CM | POA: Diagnosis not present

## 2017-11-22 DIAGNOSIS — E7849 Other hyperlipidemia: Secondary | ICD-10-CM | POA: Diagnosis not present

## 2017-11-29 DIAGNOSIS — I10 Essential (primary) hypertension: Secondary | ICD-10-CM | POA: Diagnosis not present

## 2017-11-29 DIAGNOSIS — R309 Painful micturition, unspecified: Secondary | ICD-10-CM | POA: Diagnosis not present

## 2017-11-29 DIAGNOSIS — N390 Urinary tract infection, site not specified: Secondary | ICD-10-CM | POA: Diagnosis not present

## 2017-11-29 DIAGNOSIS — E785 Hyperlipidemia, unspecified: Secondary | ICD-10-CM | POA: Diagnosis not present

## 2017-12-16 DIAGNOSIS — R002 Palpitations: Secondary | ICD-10-CM | POA: Diagnosis not present

## 2017-12-16 DIAGNOSIS — I10 Essential (primary) hypertension: Secondary | ICD-10-CM | POA: Diagnosis not present

## 2017-12-16 DIAGNOSIS — I471 Supraventricular tachycardia: Secondary | ICD-10-CM | POA: Diagnosis not present

## 2017-12-16 DIAGNOSIS — E785 Hyperlipidemia, unspecified: Secondary | ICD-10-CM | POA: Diagnosis not present

## 2017-12-19 DIAGNOSIS — R002 Palpitations: Secondary | ICD-10-CM | POA: Diagnosis not present

## 2017-12-19 DIAGNOSIS — I10 Essential (primary) hypertension: Secondary | ICD-10-CM | POA: Diagnosis not present

## 2017-12-19 DIAGNOSIS — I471 Supraventricular tachycardia: Secondary | ICD-10-CM | POA: Diagnosis not present

## 2017-12-19 DIAGNOSIS — E785 Hyperlipidemia, unspecified: Secondary | ICD-10-CM | POA: Diagnosis not present

## 2017-12-20 DIAGNOSIS — I10 Essential (primary) hypertension: Secondary | ICD-10-CM | POA: Diagnosis not present

## 2017-12-20 DIAGNOSIS — R002 Palpitations: Secondary | ICD-10-CM | POA: Diagnosis not present

## 2017-12-20 DIAGNOSIS — I471 Supraventricular tachycardia: Secondary | ICD-10-CM | POA: Diagnosis not present

## 2017-12-20 DIAGNOSIS — E785 Hyperlipidemia, unspecified: Secondary | ICD-10-CM | POA: Diagnosis not present

## 2018-02-20 DIAGNOSIS — I471 Supraventricular tachycardia: Secondary | ICD-10-CM | POA: Diagnosis not present

## 2018-02-20 DIAGNOSIS — E785 Hyperlipidemia, unspecified: Secondary | ICD-10-CM | POA: Diagnosis not present

## 2018-02-20 DIAGNOSIS — R002 Palpitations: Secondary | ICD-10-CM | POA: Diagnosis not present

## 2018-02-20 DIAGNOSIS — I10 Essential (primary) hypertension: Secondary | ICD-10-CM | POA: Diagnosis not present

## 2018-03-27 DIAGNOSIS — I10 Essential (primary) hypertension: Secondary | ICD-10-CM | POA: Diagnosis not present

## 2018-03-27 DIAGNOSIS — R002 Palpitations: Secondary | ICD-10-CM | POA: Diagnosis not present

## 2018-03-27 DIAGNOSIS — I471 Supraventricular tachycardia: Secondary | ICD-10-CM | POA: Diagnosis not present

## 2018-03-27 DIAGNOSIS — E785 Hyperlipidemia, unspecified: Secondary | ICD-10-CM | POA: Diagnosis not present

## 2018-04-19 DIAGNOSIS — R490 Dysphonia: Secondary | ICD-10-CM | POA: Diagnosis not present

## 2018-04-20 ENCOUNTER — Other Ambulatory Visit: Payer: Self-pay | Admitting: Internal Medicine

## 2018-04-20 DIAGNOSIS — Z1231 Encounter for screening mammogram for malignant neoplasm of breast: Secondary | ICD-10-CM

## 2018-04-26 DIAGNOSIS — L578 Other skin changes due to chronic exposure to nonionizing radiation: Secondary | ICD-10-CM | POA: Diagnosis not present

## 2018-04-26 DIAGNOSIS — L821 Other seborrheic keratosis: Secondary | ICD-10-CM | POA: Diagnosis not present

## 2018-04-26 DIAGNOSIS — Z1283 Encounter for screening for malignant neoplasm of skin: Secondary | ICD-10-CM | POA: Diagnosis not present

## 2018-04-27 DIAGNOSIS — E785 Hyperlipidemia, unspecified: Secondary | ICD-10-CM | POA: Diagnosis not present

## 2018-04-27 DIAGNOSIS — I471 Supraventricular tachycardia: Secondary | ICD-10-CM | POA: Diagnosis not present

## 2018-04-27 DIAGNOSIS — R002 Palpitations: Secondary | ICD-10-CM | POA: Diagnosis not present

## 2018-04-27 DIAGNOSIS — I10 Essential (primary) hypertension: Secondary | ICD-10-CM | POA: Diagnosis not present

## 2018-05-08 ENCOUNTER — Ambulatory Visit
Admission: RE | Admit: 2018-05-08 | Discharge: 2018-05-08 | Disposition: A | Discharge status: Home / Self Care | Payer: Medicare Other | Source: Ambulatory Visit | Attending: Internal Medicine | Admitting: Internal Medicine

## 2018-05-08 DIAGNOSIS — Z1231 Encounter for screening mammogram for malignant neoplasm of breast: Secondary | ICD-10-CM | POA: Diagnosis not present

## 2018-06-17 DIAGNOSIS — Z23 Encounter for immunization: Secondary | ICD-10-CM | POA: Diagnosis not present

## 2018-07-27 ENCOUNTER — Other Ambulatory Visit: Payer: Self-pay | Admitting: Internal Medicine

## 2018-07-27 ENCOUNTER — Other Ambulatory Visit: Payer: Self-pay | Admitting: Nephrology

## 2018-07-27 DIAGNOSIS — I10 Essential (primary) hypertension: Secondary | ICD-10-CM | POA: Diagnosis not present

## 2018-07-27 DIAGNOSIS — E785 Hyperlipidemia, unspecified: Secondary | ICD-10-CM | POA: Diagnosis not present

## 2018-07-27 DIAGNOSIS — R002 Palpitations: Secondary | ICD-10-CM | POA: Diagnosis not present

## 2018-07-27 DIAGNOSIS — N3000 Acute cystitis without hematuria: Secondary | ICD-10-CM | POA: Diagnosis not present

## 2018-07-27 DIAGNOSIS — R102 Pelvic and perineal pain: Secondary | ICD-10-CM

## 2018-07-27 DIAGNOSIS — I471 Supraventricular tachycardia: Secondary | ICD-10-CM | POA: Diagnosis not present

## 2018-08-01 ENCOUNTER — Ambulatory Visit
Admission: RE | Admit: 2018-08-01 | Discharge: 2018-08-01 | Disposition: A | Discharge status: Home / Self Care | Payer: Medicare Other | Source: Ambulatory Visit | Attending: Internal Medicine | Admitting: Internal Medicine

## 2018-08-01 DIAGNOSIS — R102 Pelvic and perineal pain: Secondary | ICD-10-CM

## 2018-08-01 DIAGNOSIS — Z90710 Acquired absence of both cervix and uterus: Secondary | ICD-10-CM | POA: Diagnosis not present

## 2018-08-23 DIAGNOSIS — D696 Thrombocytopenia, unspecified: Secondary | ICD-10-CM

## 2018-08-23 HISTORY — DX: Thrombocytopenia, unspecified: D69.6

## 2018-10-18 DIAGNOSIS — R490 Dysphonia: Secondary | ICD-10-CM | POA: Diagnosis not present

## 2019-01-18 ENCOUNTER — Telehealth: Payer: Self-pay | Admitting: Gastroenterology

## 2019-01-18 NOTE — Telephone Encounter (Signed)
Patient called & wanted to schedule her colonoscopy then had second thoughts. Her las one was done on 01-22-2016 by Dr Allen Norris. She would  like to talk with the nurse before scheduling

## 2019-03-19 ENCOUNTER — Other Ambulatory Visit: Payer: Self-pay | Admitting: Internal Medicine

## 2019-03-19 ENCOUNTER — Telehealth: Payer: Self-pay | Admitting: Gastroenterology

## 2019-03-19 DIAGNOSIS — Z1231 Encounter for screening mammogram for malignant neoplasm of breast: Secondary | ICD-10-CM

## 2019-03-19 NOTE — Telephone Encounter (Signed)
Patient called & would like to schedule her 3 yr  f/u colonoscopy with Dr Allen Norris. She was due in June 2020.

## 2019-03-20 ENCOUNTER — Other Ambulatory Visit: Payer: Self-pay

## 2019-03-20 DIAGNOSIS — Z86010 Personal history of colonic polyps: Secondary | ICD-10-CM

## 2019-03-20 NOTE — Telephone Encounter (Signed)
Left vm for pt to return my call to schedule colonoscopy.

## 2019-03-20 NOTE — Telephone Encounter (Signed)
Pt scheduled for a colonoscopy at [facility] on 04/05/19.

## 2019-03-22 DIAGNOSIS — I10 Essential (primary) hypertension: Secondary | ICD-10-CM | POA: Diagnosis not present

## 2019-03-22 DIAGNOSIS — E7849 Other hyperlipidemia: Secondary | ICD-10-CM | POA: Diagnosis not present

## 2019-03-22 DIAGNOSIS — R5381 Other malaise: Secondary | ICD-10-CM | POA: Diagnosis not present

## 2019-03-26 DIAGNOSIS — I471 Supraventricular tachycardia: Secondary | ICD-10-CM | POA: Diagnosis not present

## 2019-03-26 DIAGNOSIS — R002 Palpitations: Secondary | ICD-10-CM | POA: Diagnosis not present

## 2019-03-26 DIAGNOSIS — N3000 Acute cystitis without hematuria: Secondary | ICD-10-CM | POA: Diagnosis not present

## 2019-03-26 DIAGNOSIS — I10 Essential (primary) hypertension: Secondary | ICD-10-CM | POA: Diagnosis not present

## 2019-03-29 ENCOUNTER — Other Ambulatory Visit: Payer: Self-pay

## 2019-03-29 ENCOUNTER — Other Ambulatory Visit
Admission: RE | Admit: 2019-03-29 | Discharge: 2019-03-29 | Disposition: A | Discharge status: Home / Self Care | Payer: Medicare Other | Source: Ambulatory Visit | Attending: Gastroenterology | Admitting: Gastroenterology

## 2019-03-29 DIAGNOSIS — D691 Qualitative platelet defects: Secondary | ICD-10-CM | POA: Diagnosis not present

## 2019-03-29 LAB — CBC WITH DIFFERENTIAL/PLATELET
Abs Immature Granulocytes: 0.03 10*3/uL (ref 0.00–0.07)
Basophils Absolute: 0 10*3/uL (ref 0.0–0.1)
Basophils Relative: 0 %
Eosinophils Absolute: 0.1 10*3/uL (ref 0.0–0.5)
Eosinophils Relative: 1 %
HCT: 42 % (ref 36.0–46.0)
Hemoglobin: 14.6 g/dL (ref 12.0–15.0)
Immature Granulocytes: 0 %
Lymphocytes Relative: 34 %
Lymphs Abs: 3 10*3/uL (ref 0.7–4.0)
MCH: 32.2 pg (ref 26.0–34.0)
MCHC: 34.8 g/dL (ref 30.0–36.0)
MCV: 92.5 fL (ref 80.0–100.0)
Monocytes Absolute: 0.7 10*3/uL (ref 0.1–1.0)
Monocytes Relative: 8 %
Neutro Abs: 5 10*3/uL (ref 1.7–7.7)
Neutrophils Relative %: 57 %
Platelets: 237 10*3/uL (ref 150–400)
RBC: 4.54 MIL/uL (ref 3.87–5.11)
RDW: 13 % (ref 11.5–15.5)
Smear Review: NORMAL
WBC Morphology: >10
WBC: 8.9 10*3/uL (ref 4.0–10.5)
nRBC: 0 % (ref 0.0–0.2)

## 2019-03-30 ENCOUNTER — Encounter: Payer: Self-pay | Admitting: Internal Medicine

## 2019-04-02 ENCOUNTER — Other Ambulatory Visit: Admission: RE | Admit: 2019-04-02 | Payer: Medicare Other | Source: Ambulatory Visit

## 2019-04-02 ENCOUNTER — Other Ambulatory Visit: Payer: Self-pay

## 2019-04-02 ENCOUNTER — Telehealth: Payer: Self-pay | Admitting: Gastroenterology

## 2019-04-02 DIAGNOSIS — D691 Qualitative platelet defects: Secondary | ICD-10-CM

## 2019-04-02 NOTE — Telephone Encounter (Signed)
Patient called & l/m on v/m asking for Ginger to call her 1st thing. She is scheduled for a colonoscopy for Thursday.

## 2019-04-02 NOTE — Telephone Encounter (Signed)
Pt is calling she has  a Colonoscopy scheduled for 04/05/19 and she is having her Covid testing this morning  And has no ride for the procedure due to her daughter being a Pharmacist, hospital . She would like to see if we can reschedule to next Thursday. She also had Labs drawn and ours came back normal but the ones her Family doctor orders did not she has to redraw them again and is unsure of where to have them redrawn. She would like a call soon

## 2019-04-02 NOTE — Telephone Encounter (Signed)
Spoke with pt regarding her colonoscopy scheduled for 04/05/19. Pt had to reschedule due to a ride issue. Rescheduled to 04/12/19.   Pt also has been requested by Dr. Lavera Guise to recheck CBC as their test showed her platelets to be 42 and [facility] lab showed them to be 237 on 03/29/19. Pt will either repeat at his office or go back to [facility] on 04/05/19 to retest.

## 2019-04-05 ENCOUNTER — Other Ambulatory Visit: Payer: Self-pay

## 2019-04-05 ENCOUNTER — Telehealth: Payer: Self-pay | Admitting: Gastroenterology

## 2019-04-05 ENCOUNTER — Other Ambulatory Visit
Admission: RE | Admit: 2019-04-05 | Discharge: 2019-04-05 | Disposition: A | Discharge status: Home / Self Care | Payer: Medicare Other | Source: Ambulatory Visit | Attending: Gastroenterology | Admitting: Gastroenterology

## 2019-04-05 DIAGNOSIS — D691 Qualitative platelet defects: Secondary | ICD-10-CM | POA: Insufficient documentation

## 2019-04-05 LAB — CBC WITH DIFFERENTIAL/PLATELET
Abs Immature Granulocytes: 0.03 10*3/uL (ref 0.00–0.07)
Basophils Absolute: 0.1 10*3/uL (ref 0.0–0.1)
Basophils Relative: 1 %
Eosinophils Absolute: 0.1 10*3/uL (ref 0.0–0.5)
Eosinophils Relative: 1 %
HCT: 41.2 % (ref 36.0–46.0)
Hemoglobin: 14.2 g/dL (ref 12.0–15.0)
Immature Granulocytes: 0 %
Lymphocytes Relative: 40 %
Lymphs Abs: 3.2 10*3/uL (ref 0.7–4.0)
MCH: 32.3 pg (ref 26.0–34.0)
MCHC: 34.5 g/dL (ref 30.0–36.0)
MCV: 93.8 fL (ref 80.0–100.0)
Monocytes Absolute: 0.7 10*3/uL (ref 0.1–1.0)
Monocytes Relative: 8 %
Neutro Abs: 4 10*3/uL (ref 1.7–7.7)
Neutrophils Relative %: 50 %
Platelets: 202 10*3/uL (ref 150–400)
RBC: 4.39 MIL/uL (ref 3.87–5.11)
RDW: 13 % (ref 11.5–15.5)
Smear Review: NORMAL
WBC Morphology: ABNORMAL
WBC: 8 10*3/uL (ref 4.0–10.5)
nRBC: 0 % (ref 0.0–0.2)

## 2019-04-05 NOTE — Telephone Encounter (Signed)
PATIENT CALLED & WOULD LIKE HER RESULTS.

## 2019-04-06 NOTE — Telephone Encounter (Signed)
Pt is calling again about her Lab results she has to do her Covid test Monday and really is wanting her results today please call pt

## 2019-04-09 ENCOUNTER — Other Ambulatory Visit
Admission: RE | Admit: 2019-04-09 | Discharge: 2019-04-09 | Disposition: A | Discharge status: Home / Self Care | Payer: Medicare Other | Source: Ambulatory Visit | Attending: Gastroenterology | Admitting: Gastroenterology

## 2019-04-09 ENCOUNTER — Other Ambulatory Visit: Payer: Self-pay

## 2019-04-09 DIAGNOSIS — Z20828 Contact with and (suspected) exposure to other viral communicable diseases: Secondary | ICD-10-CM | POA: Insufficient documentation

## 2019-04-09 DIAGNOSIS — Z01812 Encounter for preprocedural laboratory examination: Secondary | ICD-10-CM | POA: Diagnosis not present

## 2019-04-09 LAB — SARS CORONAVIRUS 2 (TAT 6-24 HRS): SARS Coronavirus 2: NEGATIVE

## 2019-04-09 NOTE — Telephone Encounter (Signed)
Pt notified of lab results

## 2019-04-10 NOTE — Anesthesia Preprocedure Evaluation (Addendum)
Anesthesia Evaluation  Patient identified by MRN, date of birth, ID band  Reviewed: reviewed documented beta blocker date and time   History of Anesthesia Complications Negative for: history of anesthetic complications  Airway Mallampati: III  TM Distance: >3 FB Neck ROM: Full    Dental  (+) Partial Lower   Pulmonary Current Smoker (1/2 ppd, smoked 2 cigarettes this AM)Patient did not abstain from smoking.,    breath sounds clear to auscultation       Cardiovascular hypertension, Pt. on medications and Pt. on home beta blockers + dysrhythmias (Palpitations related to anxiety)  Rhythm:Regular Rate:Normal   HLD   Neuro/Psych Anxiety    GI/Hepatic   Endo/Other  Hypothyroidism   Renal/GU      Musculoskeletal   Abdominal   Peds  Hematology   Anesthesia Other Findings   Reproductive/Obstetrics                           Anesthesia Physical Anesthesia Plan  ASA: III  Anesthesia Plan: General   Post-op Pain Management:    Induction: Intravenous  PONV Risk Score and Plan: 2 and TIVA  Airway Management Planned: Natural Airway  Additional Equipment:   Intra-op Plan:   Post-operative Plan:   Informed Consent: I have reviewed the patients History and Physical, chart, labs and discussed the procedure including the risks, benefits and alternatives for the proposed anesthesia with the patient or authorized representative who has indicated his/her understanding and acceptance.       Plan Discussed with: CRNA and Anesthesiologist  Anesthesia Plan Comments: (General anesthesia with propofol infusion and native airway PIV, standard monitors, recovery in PACU)       Anesthesia Quick Evaluation  Active Ambulatory Problems    Diagnosis Date Noted  . Special screening for malignant neoplasms, colon   . Benign neoplasm of cecum   . Benign neoplasm of ascending colon    Resolved  Ambulatory Problems    Diagnosis Date Noted  . No Resolved Ambulatory Problems   Past Medical History:  Diagnosis Date  . Anxiety   . Dysrhythmia   . Hemorrhoid   . Hypercholesteremia   . Hypertension   . Hypothyroidism   . Palpitations   . Vertigo   . Wears dentures    CBC    Component Value Date/Time   WBC 8.0 04/05/2019 1137   RBC 4.39 04/05/2019 1137   HGB 14.2 04/05/2019 1137   HGB 13.8 06/05/2013 1127   HCT 41.2 04/05/2019 1137   HCT 40.0 06/05/2013 1127   PLT 202 04/05/2019 1137   PLT 303 06/05/2013 1127   MCV 93.8 04/05/2019 1137   MCV 92 06/05/2013 1127   MCH 32.3 04/05/2019 1137   MCHC 34.5 04/05/2019 1137   RDW 13.0 04/05/2019 1137   RDW 14.0 06/05/2013 1127   LYMPHSABS 3.2 04/05/2019 1137   MONOABS 0.7 04/05/2019 1137   EOSABS 0.1 04/05/2019 1137   BASOSABS 0.1 04/05/2019 1137    CMP     Component Value Date/Time   NA 131 (L) 06/05/2013 1127   K 3.0 (L) 06/05/2013 1127   CL 96 (L) 06/05/2013 1127   CO2 26 06/05/2013 1127   GLUCOSE 127 (H) 06/05/2013 1127   BUN 10 06/05/2013 1127   CREATININE 0.94 06/05/2013 1127   CALCIUM 9.1 06/05/2013 1127   GFRNONAA >60 06/05/2013 1127   GFRAA >60 06/05/2013 1127

## 2019-04-11 ENCOUNTER — Telehealth: Payer: Self-pay | Admitting: Gastroenterology

## 2019-04-11 NOTE — Telephone Encounter (Signed)
Pt left vm to speak to Ginger

## 2019-04-11 NOTE — Telephone Encounter (Signed)
Spoke with pt and answered questions regarding her medications to take for tomorrow.

## 2019-04-11 NOTE — Discharge Instructions (Signed)

## 2019-04-12 ENCOUNTER — Ambulatory Visit: Payer: Medicare Other | Admitting: Anesthesiology

## 2019-04-12 ENCOUNTER — Ambulatory Visit
Admission: RE | Disposition: A | Discharge status: Home / Self Care | Payer: Self-pay | Source: Home / Self Care | Attending: Gastroenterology

## 2019-04-12 ENCOUNTER — Other Ambulatory Visit: Payer: Self-pay

## 2019-04-12 ENCOUNTER — Telehealth: Payer: Self-pay | Admitting: Gastroenterology

## 2019-04-12 ENCOUNTER — Ambulatory Visit
Admission: RE | Admit: 2019-04-12 | Discharge: 2019-04-12 | Disposition: A | Discharge status: Home / Self Care | Payer: Medicare Other | Attending: Gastroenterology | Admitting: Gastroenterology

## 2019-04-12 DIAGNOSIS — D123 Benign neoplasm of transverse colon: Secondary | ICD-10-CM | POA: Diagnosis not present

## 2019-04-12 DIAGNOSIS — F419 Anxiety disorder, unspecified: Secondary | ICD-10-CM | POA: Diagnosis not present

## 2019-04-12 DIAGNOSIS — E7800 Pure hypercholesterolemia, unspecified: Secondary | ICD-10-CM | POA: Diagnosis not present

## 2019-04-12 DIAGNOSIS — D125 Benign neoplasm of sigmoid colon: Secondary | ICD-10-CM | POA: Insufficient documentation

## 2019-04-12 DIAGNOSIS — Z09 Encounter for follow-up examination after completed treatment for conditions other than malignant neoplasm: Secondary | ICD-10-CM | POA: Diagnosis not present

## 2019-04-12 DIAGNOSIS — D124 Benign neoplasm of descending colon: Secondary | ICD-10-CM | POA: Insufficient documentation

## 2019-04-12 DIAGNOSIS — F17210 Nicotine dependence, cigarettes, uncomplicated: Secondary | ICD-10-CM | POA: Insufficient documentation

## 2019-04-12 DIAGNOSIS — Z860100 Personal history of colon polyps, unspecified: Secondary | ICD-10-CM

## 2019-04-12 DIAGNOSIS — K640 First degree hemorrhoids: Secondary | ICD-10-CM | POA: Diagnosis not present

## 2019-04-12 DIAGNOSIS — E785 Hyperlipidemia, unspecified: Secondary | ICD-10-CM | POA: Insufficient documentation

## 2019-04-12 DIAGNOSIS — Z79890 Hormone replacement therapy (postmenopausal): Secondary | ICD-10-CM | POA: Diagnosis not present

## 2019-04-12 DIAGNOSIS — E039 Hypothyroidism, unspecified: Secondary | ICD-10-CM | POA: Insufficient documentation

## 2019-04-12 DIAGNOSIS — D122 Benign neoplasm of ascending colon: Secondary | ICD-10-CM | POA: Insufficient documentation

## 2019-04-12 DIAGNOSIS — D120 Benign neoplasm of cecum: Secondary | ICD-10-CM | POA: Diagnosis not present

## 2019-04-12 DIAGNOSIS — Z8719 Personal history of other diseases of the digestive system: Secondary | ICD-10-CM | POA: Diagnosis not present

## 2019-04-12 DIAGNOSIS — K635 Polyp of colon: Secondary | ICD-10-CM | POA: Diagnosis not present

## 2019-04-12 DIAGNOSIS — I10 Essential (primary) hypertension: Secondary | ICD-10-CM | POA: Diagnosis not present

## 2019-04-12 DIAGNOSIS — Z79899 Other long term (current) drug therapy: Secondary | ICD-10-CM | POA: Insufficient documentation

## 2019-04-12 HISTORY — PX: COLONOSCOPY WITH PROPOFOL: SHX5780

## 2019-04-12 HISTORY — PX: POLYPECTOMY: SHX5525

## 2019-04-12 SURGERY — COLONOSCOPY WITH PROPOFOL
Anesthesia: General | Site: Rectum

## 2019-04-12 MED ORDER — OXYCODONE HCL 5 MG/5ML PO SOLN: 5.0000 mg | Freq: Once | ORAL | Status: DC | PRN

## 2019-04-12 MED ORDER — OXYCODONE HCL 5 MG PO TABS: 5.0000 mg | ORAL_TABLET | Freq: Once | ORAL | Status: DC | PRN

## 2019-04-12 MED ORDER — PROPOFOL 10 MG/ML IV BOLUS
INTRAVENOUS | Status: DC | PRN
  Administered 2019-04-12 (×2): 20 mg via INTRAVENOUS
  Administered 2019-04-12: 100 mg via INTRAVENOUS
  Administered 2019-04-12 (×6): 30 mg via INTRAVENOUS

## 2019-04-12 MED ORDER — STERILE WATER FOR IRRIGATION IR SOLN
Status: DC | PRN
  Administered 2019-04-12: 30 mL

## 2019-04-12 MED ORDER — GLYCOPYRROLATE 0.2 MG/ML IJ SOLN
INTRAMUSCULAR | Status: DC | PRN
  Administered 2019-04-12 (×2): 0.1 mg via INTRAVENOUS

## 2019-04-12 MED ORDER — HYDROMORPHONE HCL 1 MG/ML IJ SOLN: 0.2500 mg | INTRAMUSCULAR | Status: DC | PRN

## 2019-04-12 MED ORDER — LIDOCAINE HCL (CARDIAC) PF 100 MG/5ML IV SOSY
PREFILLED_SYRINGE | INTRAVENOUS | Status: DC | PRN
  Administered 2019-04-12: 30 mg via INTRAVENOUS

## 2019-04-12 MED ORDER — LACTATED RINGERS IV SOLN
INTRAVENOUS | Status: DC
  Administered 2019-04-12: 10:00:00 via INTRAVENOUS

## 2019-04-12 MED ORDER — PROMETHAZINE HCL 25 MG/ML IJ SOLN: 6.2500 mg | INTRAMUSCULAR | Status: DC | PRN

## 2019-04-12 SURGICAL SUPPLY — 7 items
CANISTER SUCT 1200ML W/VALVE (MISCELLANEOUS) ×3 IMPLANT
GOWN CVR UNV OPN BCK APRN NK (MISCELLANEOUS) ×2 IMPLANT
GOWN ISOL THUMB LOOP REG UNIV (MISCELLANEOUS) ×4
KIT ENDO PROCEDURE OLY (KITS) ×3 IMPLANT
SNARE SHORT THROW 13M SML OVAL (MISCELLANEOUS) ×2 IMPLANT
TRAP ETRAP POLY (MISCELLANEOUS) ×2 IMPLANT
WATER STERILE IRR 250ML POUR (IV SOLUTION) ×3 IMPLANT

## 2019-04-12 NOTE — Op Note (Signed)
[facility] Patient Name: Beth Mcmillan Procedure Date: 04/12/2019 9:45 AM MRN: [id number] Account #: [id number] Date of Birth: 06/25/1946 Admit Type: Outpatient Age: 73 Room: [facility] OR ROOM 01 Gender: Female Note Status: Finalized Procedure:            Colonoscopy Indications:          High risk colon cancer surveillance: Personal history                        of colonic polyps Providers:            Lucilla Lame MD, MD Referring MD:         Cletis Athens, MD (Referring MD) Medicines:            Propofol per Anesthesia Complications:        No immediate complications. Procedure:            Pre-Anesthesia Assessment:                       - Prior to the procedure, a History and Physical was                        performed, and patient medications and allergies were                        reviewed. The patient's tolerance of previous                        anesthesia was also reviewed. The risks and benefits of                        the procedure and the sedation options and risks were                        discussed with the patient. All questions were                        answered, and informed consent was obtained. Prior                        Anticoagulants: The patient has taken no previous                        anticoagulant or antiplatelet agents. ASA Grade                        Assessment: II - A patient with mild systemic disease.                        After reviewing the risks and benefits, the patient was                        deemed in satisfactory condition to undergo the                        procedure.                       After obtaining informed consent, the colonoscope was  passed under direct vision. Throughout the procedure,                        the patient's blood pressure, pulse, and oxygen                        saturations were monitored continuously. The was                        introduced  through the anus and advanced to the the                        cecum, identified by appendiceal orifice and ileocecal                        valve. The colonoscopy was performed without                        difficulty. The patient tolerated the procedure well.                        The quality of the bowel preparation was excellent. Findings:      The perianal and digital rectal examinations were normal.      A 4 mm polyp was found in the cecum. The polyp was sessile. The polyp       was removed with a cold snare. Resection and retrieval were complete.      Two sessile polyps were found in the ascending colon. The polyps were 3       to 4 mm in size. These polyps were removed with a cold snare. Resection       and retrieval were complete.      Two sessile polyps were found in the transverse colon. The polyps were 3       to 5 mm in size.      Two sessile polyps were found in the descending colon. The polyps were 3       to 5 mm in size. These polyps were removed with a cold snare. Resection       and retrieval were complete.      Four sessile polyps were found in the sigmoid colon. The polyps were 1       to 4 mm in size. These polyps were removed with a cold snare. Resection       and retrieval were complete.      Non-bleeding internal hemorrhoids were found during retroflexion. The       hemorrhoids were Grade II (internal hemorrhoids that prolapse but reduce       spontaneously). Impression:           - One 4 mm polyp in the cecum, removed with a cold                        snare. Resected and retrieved.                       - Two 3 to 4 mm polyps in the ascending colon, removed                        with a cold snare. Resected and retrieved.                       -  Two 3 to 5 mm polyps in the transverse colon.                       - Two 3 to 5 mm polyps in the descending colon, removed                        with a cold snare. Resected and retrieved.                       -  Four 1 to 4 mm polyps in the sigmoid colon, removed                        with a cold snare. Resected and retrieved.                       - Non-bleeding internal hemorrhoids. Recommendation:       - Discharge patient to home.                       - Resume previous diet.                       - Continue present medications.                       - Await pathology results.                       - Repeat colonoscopy in 3 years for surveillance. Procedure Code(s):    --- Professional ---                       [phone], Colonoscopy, flexible; with removal of tumor(s),                        polyp(s), or other lesion(s) by snare technique Diagnosis Code(s):    --- Professional ---                       Z86.010, Personal history of colonic polyps                       K63.5, Polyp of colon CPT copyright 2019 American Medical Association. All rights reserved. The codes documented in this report are preliminary and upon coder review may  be revised to meet current compliance requirements. Lucilla Lame MD, MD 04/12/2019 10:21:39 AM This report has been signed electronically. Number of Addenda: 0 Note Initiated On: 04/12/2019 9:45 AM Scope Withdrawal Time: 0 hours 7 minutes 13 seconds  Total Procedure Duration: 0 hours 21 minutes 30 seconds  Estimated Blood Loss: Estimated blood loss: none.      [facility]

## 2019-04-12 NOTE — Anesthesia Postprocedure Evaluation (Signed)
Anesthesia Post Note  Patient: Beth Mcmillan  Procedure(s) Performed: COLONOSCOPY WITH BIOPSY (N/A Rectum) POLYPECTOMY (N/A Rectum)  Patient location during evaluation: PACU Anesthesia Type: General Level of consciousness: awake and alert Pain management: pain level controlled Vital Signs Assessment: post-procedure vital signs reviewed and stable Respiratory status: spontaneous breathing, nonlabored ventilation, respiratory function stable and patient connected to nasal cannula oxygen Cardiovascular status: blood pressure returned to baseline and stable Postop Assessment: no apparent nausea or vomiting Anesthetic complications: no    Kason Benak A  Brina Umeda

## 2019-04-12 NOTE — Telephone Encounter (Signed)
Pt left vm for ginger to discuss something that was talked about yesterday

## 2019-04-12 NOTE — Transfer of Care (Signed)
Immediate Anesthesia Transfer of Care Note  Patient: Beth Mcmillan  Procedure(s) Performed: COLONOSCOPY WITH BIOPSY (N/A Rectum) POLYPECTOMY (N/A Rectum)  Patient Location: PACU  Anesthesia Type: General  Level of Consciousness: awake, alert  and patient cooperative  Airway and Oxygen Therapy: Patient Spontanous Breathing and Patient connected to supplemental oxygen  Post-op Assessment: Post-op Vital signs reviewed, Patient's Cardiovascular Status Stable, Respiratory Function Stable, Patent Airway and No signs of Nausea or vomiting  Post-op Vital Signs: Reviewed and stable  Complications: No apparent anesthesia complications

## 2019-04-12 NOTE — Anesthesia Procedure Notes (Signed)
Date/Time: 04/12/2019 9:55 AM Performed by: Cameron Ali, CRNA Pre-anesthesia Checklist: Patient identified, Emergency Drugs available, Suction available, Timeout performed and Patient being monitored Patient Re-evaluated:Patient Re-evaluated prior to induction Oxygen Delivery Method: Nasal cannula Placement Confirmation: positive ETCO2

## 2019-04-12 NOTE — Telephone Encounter (Signed)
Let the patient know that we were sending the test for her history of low platelets.  I am sorry but I am not an expert on blood morphology.  She can contact her primary care provider and may be will need to see a hematologist.

## 2019-04-12 NOTE — Telephone Encounter (Signed)
Pt forgot to ask you this morning about something on her recent CBC. She was wondering about the WBC-MORPHOLOGY- abnormal lymphocytes present. They were normal the week before. Please advise.

## 2019-04-12 NOTE — H&P (Signed)
Lucilla Lame, MD [address] [place], [place] [address] Phone:[phone] Fax : [phone]  Primary Care Physician:  Cletis Athens, MD Primary Gastroenterologist:  Dr. Allen Norris  Pre-Procedure History & Physical: HPI:  Beth Mcmillan is a 73 y.o. female is here for an colonoscopy.   Past Medical History:  Diagnosis Date  . Anxiety   . Dysrhythmia    tachycardia  . Hemorrhoid   . Hypercholesteremia   . Hypertension   . Hypothyroidism   . Palpitations    with anxiety  . Vertigo    no episodes in several years  . Wears dentures    partial lower    Past Surgical History:  Procedure Laterality Date  . ABDOMINAL HYSTERECTOMY     partial  . COLONOSCOPY WITH PROPOFOL N/A 01/22/2016   Procedure: COLONOSCOPY WITH PROPOFOL;  Surgeon: Lucilla Lame, MD;  Location: [facility];  Service: Endoscopy;  Laterality: N/A;  . POLYPECTOMY  01/22/2016   Procedure: POLYPECTOMY;  Surgeon: Lucilla Lame, MD;  Location: [facility];  Service: Endoscopy;;    Prior to Admission medications   Medication Sig Start Date End Date Taking? Authorizing Provider  Ascorbic Acid (VITAMIN C) 500 MG CHEW Chew by mouth.   Yes [provider]  atorvastatin (LIPITOR) 10 MG tablet  06/17/14  Yes [provider]  Cholecalciferol (VITAMIN D PO) Take by mouth daily.   Yes [provider]  Ergocalciferol (VITAMIN D2) 10 MCG (400 UNIT) TABS Take by mouth.   Yes [provider]  Homeopathic Products (FRANKINCENSE UPLIFTING) OIL Inhale into the lungs.   Yes [provider]  levothyroxine (SYNTHROID, LEVOTHROID) 50 MCG tablet Take 50 mcg by mouth daily before breakfast.   Yes [provider]  losartan-hydrochlorothiazide (HYZAAR) 100-25 MG tablet Take 1 tablet by mouth daily.   Yes [provider]  metoprolol succinate (TOPROL-XL) 25 MG 24 hr tablet 50 mg.  07/01/14  Yes [provider]  PARoxetine (PAXIL) 10 MG tablet Take  10 mg by mouth daily.   Yes [provider]  VITAMIN E PO Take by mouth daily.   Yes [provider]  ALPRAZolam (XANAX) 0.25 MG tablet Take 0.25 mg by mouth 2 (two) times daily as needed for anxiety.    [provider]  benazepril-hydrochlorthiazide (LOTENSIN HCT) 20-12.5 MG tablet Take 1 tablet by mouth daily.    [provider]  loratadine (CLARITIN) 10 MG tablet Take by mouth daily as needed. Reported on 01/22/2016    [provider]  polyethylene glycol (GOLYTELY) 236 g solution Take 4,000 mLs by mouth once. Drink one 8 oz glass every 30 mins until stools are clear. 01/08/16   Lucilla Lame, MD    Allergies as of 03/20/2019 - Review Complete 01/22/2016  Allergen Reaction Noted  . Codeine Nausea Only 01/08/2016  . Tape Rash 01/14/2016    Family History  Problem Relation Age of Onset  . Breast cancer Neg Hx     Social History   Socioeconomic History  . Marital status: Married    Spouse name: Not on file  . Number of children: Not on file  . Years of education: Not on file  . Highest education level: Not on file  Occupational History  . Not on file  Social Needs  . Financial resource strain: Not on file  . Food insecurity    Worry: Not on file    Inability: Not on file  . Transportation needs  Medical: Not on file    Non-medical: Not on file  Tobacco Use  . Smoking status: Current Every Day Smoker    Packs/day: 0.50    Years: 50.00    Pack years: 25.00    Types: Cigarettes  Substance and Sexual Activity  . Alcohol use: No  . Drug use: Not on file  . Sexual activity: Not on file  Lifestyle  . Physical activity    Days per week: Not on file    Minutes per session: Not on file  . Stress: Not on file  Relationships  . Social Herbalist on phone: Not on file    Gets together: Not on file    Attends religious service: Not on file    Active member of club or organization: Not on file    Attends meetings of clubs  or organizations: Not on file    Relationship status: Not on file  . Intimate partner violence    Fear of current or ex partner: Not on file    Emotionally abused: Not on file    Physically abused: Not on file    Forced sexual activity: Not on file  Other Topics Concern  . Not on file  Social History Narrative  . Not on file    Review of Systems: See HPI, otherwise negative ROS  Physical Exam: BP (!) 177/82   Pulse 68   Temp (!) 97.3 F (36.3 C) (Temporal)   Ht 5\' 7"  (1.702 m)   Wt 64 kg   SpO2 98%   BMI 22.08 kg/m  General:   Alert,  pleasant and cooperative in NAD Head:  Normocephalic and atraumatic. Neck:  Supple; no masses or thyromegaly. Lungs:  Clear throughout to auscultation.    Heart:  Regular rate and rhythm. Abdomen:  Soft, nontender and nondistended. Normal bowel sounds, without guarding, and without rebound.   Neurologic:  Alert and  oriented x4;  grossly normal neurologically.  Impression/Plan: Beth Mcmillan is here for an colonoscopy to be performed for history of colon polyps 01/22/2016  Risks, benefits, limitations, and alternatives regarding  colonoscopy have been reviewed with the patient.  Questions have been answered.  All parties agreeable.   Lucilla Lame, MD  04/12/2019, 9:48 AM

## 2019-04-13 ENCOUNTER — Encounter: Payer: Self-pay | Admitting: Gastroenterology

## 2019-04-16 ENCOUNTER — Encounter: Payer: Self-pay | Admitting: Gastroenterology

## 2019-04-16 NOTE — Telephone Encounter (Signed)
Pt notified to contact her PCP for this information.

## 2019-04-17 ENCOUNTER — Encounter: Payer: Self-pay | Admitting: Gastroenterology

## 2019-04-18 DIAGNOSIS — R490 Dysphonia: Secondary | ICD-10-CM | POA: Diagnosis not present

## 2019-05-01 DIAGNOSIS — Z808 Family history of malignant neoplasm of other organs or systems: Secondary | ICD-10-CM | POA: Diagnosis not present

## 2019-05-01 DIAGNOSIS — L578 Other skin changes due to chronic exposure to nonionizing radiation: Secondary | ICD-10-CM | POA: Diagnosis not present

## 2019-05-11 ENCOUNTER — Ambulatory Visit
Admission: RE | Admit: 2019-05-11 | Discharge: 2019-05-11 | Disposition: A | Discharge status: Home / Self Care | Payer: Medicare Other | Source: Ambulatory Visit | Attending: Internal Medicine | Admitting: Internal Medicine

## 2019-05-11 DIAGNOSIS — Z1231 Encounter for screening mammogram for malignant neoplasm of breast: Secondary | ICD-10-CM

## 2019-06-15 DIAGNOSIS — Z23 Encounter for immunization: Secondary | ICD-10-CM | POA: Diagnosis not present

## 2019-10-02 DIAGNOSIS — Z79899 Other long term (current) drug therapy: Secondary | ICD-10-CM | POA: Diagnosis not present

## 2019-10-02 DIAGNOSIS — E039 Hypothyroidism, unspecified: Secondary | ICD-10-CM | POA: Diagnosis not present

## 2019-10-02 DIAGNOSIS — F411 Generalized anxiety disorder: Secondary | ICD-10-CM | POA: Diagnosis present

## 2019-10-02 DIAGNOSIS — K635 Polyp of colon: Secondary | ICD-10-CM | POA: Insufficient documentation

## 2019-10-02 DIAGNOSIS — E782 Mixed hyperlipidemia: Secondary | ICD-10-CM | POA: Diagnosis not present

## 2019-10-02 DIAGNOSIS — F17200 Nicotine dependence, unspecified, uncomplicated: Secondary | ICD-10-CM | POA: Diagnosis not present

## 2019-10-02 DIAGNOSIS — J381 Polyp of vocal cord and larynx: Secondary | ICD-10-CM | POA: Insufficient documentation

## 2019-10-02 DIAGNOSIS — R7309 Other abnormal glucose: Secondary | ICD-10-CM | POA: Diagnosis not present

## 2019-10-02 DIAGNOSIS — J449 Chronic obstructive pulmonary disease, unspecified: Secondary | ICD-10-CM | POA: Diagnosis not present

## 2019-10-02 DIAGNOSIS — I10 Essential (primary) hypertension: Secondary | ICD-10-CM | POA: Diagnosis not present

## 2019-10-05 DIAGNOSIS — Z23 Encounter for immunization: Secondary | ICD-10-CM | POA: Diagnosis not present

## 2019-10-12 DIAGNOSIS — E782 Mixed hyperlipidemia: Secondary | ICD-10-CM | POA: Diagnosis not present

## 2019-10-12 DIAGNOSIS — I10 Essential (primary) hypertension: Secondary | ICD-10-CM | POA: Diagnosis not present

## 2019-10-12 DIAGNOSIS — Z79899 Other long term (current) drug therapy: Secondary | ICD-10-CM | POA: Diagnosis not present

## 2019-10-12 DIAGNOSIS — R7309 Other abnormal glucose: Secondary | ICD-10-CM | POA: Diagnosis not present

## 2019-10-12 DIAGNOSIS — E039 Hypothyroidism, unspecified: Secondary | ICD-10-CM | POA: Diagnosis not present

## 2019-10-17 DIAGNOSIS — F17200 Nicotine dependence, unspecified, uncomplicated: Secondary | ICD-10-CM | POA: Diagnosis not present

## 2019-10-17 DIAGNOSIS — R490 Dysphonia: Secondary | ICD-10-CM | POA: Diagnosis not present

## 2019-10-17 DIAGNOSIS — J381 Polyp of vocal cord and larynx: Secondary | ICD-10-CM | POA: Diagnosis not present

## 2019-11-02 DIAGNOSIS — Z23 Encounter for immunization: Secondary | ICD-10-CM | POA: Diagnosis not present

## 2019-11-15 DIAGNOSIS — F17200 Nicotine dependence, unspecified, uncomplicated: Secondary | ICD-10-CM | POA: Diagnosis not present

## 2019-11-15 DIAGNOSIS — I10 Essential (primary) hypertension: Secondary | ICD-10-CM | POA: Diagnosis not present

## 2019-12-27 DIAGNOSIS — Z720 Tobacco use: Secondary | ICD-10-CM | POA: Diagnosis not present

## 2019-12-27 DIAGNOSIS — Z0000 Encounter for general adult medical examination without abnormal findings: Secondary | ICD-10-CM | POA: Diagnosis not present

## 2019-12-27 DIAGNOSIS — E039 Hypothyroidism, unspecified: Secondary | ICD-10-CM | POA: Diagnosis not present

## 2019-12-27 DIAGNOSIS — E782 Mixed hyperlipidemia: Secondary | ICD-10-CM | POA: Diagnosis not present

## 2019-12-27 DIAGNOSIS — I10 Essential (primary) hypertension: Secondary | ICD-10-CM | POA: Diagnosis not present

## 2019-12-27 DIAGNOSIS — Z79899 Other long term (current) drug therapy: Secondary | ICD-10-CM | POA: Diagnosis not present

## 2019-12-27 DIAGNOSIS — R7309 Other abnormal glucose: Secondary | ICD-10-CM | POA: Diagnosis not present

## 2020-01-27 ENCOUNTER — Other Ambulatory Visit: Payer: Self-pay | Admitting: Internal Medicine

## 2020-04-04 ENCOUNTER — Other Ambulatory Visit: Payer: Self-pay | Admitting: Internal Medicine

## 2020-04-04 DIAGNOSIS — Z1231 Encounter for screening mammogram for malignant neoplasm of breast: Secondary | ICD-10-CM

## 2020-04-08 DIAGNOSIS — Z79899 Other long term (current) drug therapy: Secondary | ICD-10-CM | POA: Diagnosis not present

## 2020-04-08 DIAGNOSIS — E782 Mixed hyperlipidemia: Secondary | ICD-10-CM | POA: Diagnosis not present

## 2020-04-08 DIAGNOSIS — I10 Essential (primary) hypertension: Secondary | ICD-10-CM | POA: Diagnosis not present

## 2020-04-08 DIAGNOSIS — E039 Hypothyroidism, unspecified: Secondary | ICD-10-CM | POA: Diagnosis not present

## 2020-04-08 DIAGNOSIS — R7309 Other abnormal glucose: Secondary | ICD-10-CM | POA: Diagnosis not present

## 2020-04-15 DIAGNOSIS — I10 Essential (primary) hypertension: Secondary | ICD-10-CM | POA: Diagnosis not present

## 2020-04-15 DIAGNOSIS — E039 Hypothyroidism, unspecified: Secondary | ICD-10-CM | POA: Diagnosis not present

## 2020-04-15 DIAGNOSIS — E782 Mixed hyperlipidemia: Secondary | ICD-10-CM | POA: Diagnosis not present

## 2020-04-15 DIAGNOSIS — F411 Generalized anxiety disorder: Secondary | ICD-10-CM | POA: Diagnosis not present

## 2020-04-16 DIAGNOSIS — J381 Polyp of vocal cord and larynx: Secondary | ICD-10-CM | POA: Diagnosis not present

## 2020-04-16 DIAGNOSIS — R490 Dysphonia: Secondary | ICD-10-CM | POA: Diagnosis not present

## 2020-04-16 DIAGNOSIS — F17200 Nicotine dependence, unspecified, uncomplicated: Secondary | ICD-10-CM | POA: Diagnosis not present

## 2020-04-30 DIAGNOSIS — L578 Other skin changes due to chronic exposure to nonionizing radiation: Secondary | ICD-10-CM | POA: Diagnosis not present

## 2020-04-30 DIAGNOSIS — Z808 Family history of malignant neoplasm of other organs or systems: Secondary | ICD-10-CM | POA: Diagnosis not present

## 2020-05-12 ENCOUNTER — Ambulatory Visit
Admission: RE | Admit: 2020-05-12 | Discharge: 2020-05-12 | Disposition: A | Discharge status: Home / Self Care | Payer: Medicare Other | Source: Ambulatory Visit | Attending: Internal Medicine | Admitting: Internal Medicine

## 2020-05-12 ENCOUNTER — Other Ambulatory Visit: Payer: Self-pay

## 2020-05-12 DIAGNOSIS — Z1231 Encounter for screening mammogram for malignant neoplasm of breast: Secondary | ICD-10-CM | POA: Diagnosis not present

## 2020-06-17 DIAGNOSIS — Z23 Encounter for immunization: Secondary | ICD-10-CM | POA: Diagnosis not present

## 2020-07-14 DIAGNOSIS — Z23 Encounter for immunization: Secondary | ICD-10-CM | POA: Diagnosis not present

## 2020-07-15 DIAGNOSIS — I10 Essential (primary) hypertension: Secondary | ICD-10-CM | POA: Diagnosis not present

## 2020-07-15 DIAGNOSIS — E039 Hypothyroidism, unspecified: Secondary | ICD-10-CM | POA: Diagnosis not present

## 2020-07-15 DIAGNOSIS — R7309 Other abnormal glucose: Secondary | ICD-10-CM | POA: Diagnosis not present

## 2020-07-15 DIAGNOSIS — E782 Mixed hyperlipidemia: Secondary | ICD-10-CM | POA: Diagnosis not present

## 2020-10-21 ENCOUNTER — Other Ambulatory Visit: Payer: Self-pay | Admitting: Internal Medicine

## 2020-10-21 DIAGNOSIS — R918 Other nonspecific abnormal finding of lung field: Secondary | ICD-10-CM

## 2020-11-04 ENCOUNTER — Ambulatory Visit
Admission: RE | Admit: 2020-11-04 | Discharge: 2020-11-04 | Disposition: A | Discharge status: Home / Self Care | Payer: Medicare Other | Source: Ambulatory Visit | Attending: Internal Medicine | Admitting: Internal Medicine

## 2020-11-04 ENCOUNTER — Other Ambulatory Visit: Payer: Self-pay

## 2020-11-04 DIAGNOSIS — R918 Other nonspecific abnormal finding of lung field: Secondary | ICD-10-CM | POA: Insufficient documentation

## 2020-11-04 MED ORDER — IOHEXOL 300 MG/ML  SOLN
75.0000 mL | Freq: Once | INTRAMUSCULAR | Status: AC | PRN
  Administered 2020-11-04: 75 mL via INTRAVENOUS

## 2020-11-14 ENCOUNTER — Other Ambulatory Visit: Payer: Self-pay | Admitting: Pulmonary Disease

## 2020-11-14 ENCOUNTER — Other Ambulatory Visit (HOSPITAL_COMMUNITY): Payer: Self-pay | Admitting: Pulmonary Disease

## 2020-11-14 DIAGNOSIS — R911 Solitary pulmonary nodule: Secondary | ICD-10-CM

## 2020-11-19 ENCOUNTER — Encounter
Admission: RE | Admit: 2020-11-19 | Discharge: 2020-11-19 | Disposition: A | Discharge status: Home / Self Care | Payer: Medicare Other | Source: Ambulatory Visit | Attending: Pulmonary Disease | Admitting: Pulmonary Disease

## 2020-11-19 ENCOUNTER — Ambulatory Visit
Admission: RE | Admit: 2020-11-19 | Discharge: 2020-11-19 | Disposition: A | Discharge status: Home / Self Care | Payer: Medicare Other | Source: Ambulatory Visit | Attending: Pulmonary Disease | Admitting: Pulmonary Disease

## 2020-11-19 ENCOUNTER — Other Ambulatory Visit: Payer: Medicare Other

## 2020-11-19 ENCOUNTER — Other Ambulatory Visit: Payer: Self-pay

## 2020-11-19 DIAGNOSIS — R911 Solitary pulmonary nodule: Secondary | ICD-10-CM | POA: Insufficient documentation

## 2020-11-19 HISTORY — DX: Chronic obstructive pulmonary disease, unspecified: J44.9

## 2020-11-19 HISTORY — DX: Tinnitus, unspecified ear: H93.19

## 2020-11-19 HISTORY — DX: Polyp of vocal cord and larynx: J38.1

## 2020-11-19 HISTORY — DX: Personal history of other diseases of the digestive system: Z87.19

## 2020-11-19 HISTORY — DX: Essential (primary) hypertension: I10

## 2020-11-19 NOTE — Patient Instructions (Addendum)
Your procedure is scheduled on: Friday November 21, 2020 Report to the Registration Desk on the 1st floor of the [facility]. To find out your arrival time, please call [phone] between 1PM - 3PM on: Thursday November 20, 2020  REMEMBER: Instructions that are not followed completely may result in serious medical risk, up to and including death; or upon the discretion of your surgeon and anesthesiologist your surgery may need to be rescheduled.  Do not eat food after midnight the night before surgery.  No gum chewing, lozengers or hard candies.  You may however, drink CLEAR liquids up to 2 hours before you are scheduled to arrive for your surgery. Do not drink anything within 2 hours of your scheduled arrival time.  Clear liquids include: - water  - apple juice without pulp - black coffee or tea (Do NOT add milk or creamers to the coffee or tea) Do NOT drink anything that is not on this list.  Type 1 and Type 2 diabetics should only drink water.  TAKE THESE MEDICATIONS THE MORNING OF SURGERY WITH A SIP OF WATER: ALPRAZOLAM LEVOTHYROXINE  METOPROLOL LORATADINE  Use inhalers on the day of surgery   One week prior to surgery: Stop Anti-inflammatories (NSAIDS) such as Advil, Aleve, Ibuprofen, Motrin, Naproxen, Naprosyn and ASPIRIN OR Aspirin based products such as Excedrin, Goodys Powder, BC Powder.  USE TYLENOL IF NEEDED Stop ANY OVER THE COUNTER supplements until after surgery.  No Alcohol for 24 hours before or after surgery.  No Smoking including e-cigarettes for 24 hours prior to surgery.  No chewable tobacco products for at least 6 hours prior to surgery.  No nicotine patches on the day of surgery.  Do not use any "recreational" drugs for at least a week prior to your surgery.  Please be advised that the combination of cocaine and anesthesia may have negative outcomes, up to and including death. If you test positive for cocaine, your surgery will be cancelled.  On the  morning of surgery brush your teeth with toothpaste and water, you may rinse your mouth with mouthwash if you wish. Do not swallow any toothpaste or mouthwash.  Do not wear jewelry, make-up, hairpins, clips or nail polish.  Do not wear lotions, powders, or perfumes.   Do not shave body from the neck down 48 hours prior to surgery just in case you cut yourself which could leave a site for infection.  Also, freshly shaved skin may become irritated if using the CHG soap.  Contact lenses, hearing aids and dentures may not be worn into surgery.  Do not bring valuables to the hospital. [facility] is not responsible for any missing/lost belongings or valuables.   SHOWER MORNING OF SURGERY  Notify your doctor if there is any change in your medical condition (cold, fever, infection).  Wear comfortable clothing (specific to your surgery type) to the hospital.  After surgery, you can help prevent lung complications by doing breathing exercises.  Take deep breaths and cough every 1-2 hours. Your doctor may order a device called an Incentive Spirometer to help you take deep breaths. When coughing or sneezing, hold a pillow firmly against your incision with both hands. This is called "splinting." Doing this helps protect your incision. It also decreases belly discomfort.  If you are being discharged the day of surgery, you will not be allowed to drive home. You will need a responsible adult (18 years or older) to drive you home and stay with you that night.  Please call the [facility] at [phone] if you have any questions about these instructions.  Surgery Visitation Policy:  Patients undergoing a surgery or procedure may have one family member or support person with them as long as that person is not COVID-19 positive or experiencing its symptoms.  That person may remain in the waiting area during the procedure.  Inpatient Visitation:    Visiting hours are 7 a.m. to 8  p.m. Inpatients will be allowed two visitors daily. The visitors may change each day during the patient's stay. No visitors under the age of 52. Any visitor under the age of 28 must be accompanied by an adult. The visitor must pass COVID-19 screenings, use hand sanitizer when entering and exiting the patient's room and wear a mask at all times, including in the patient's room. Patients must also wear a mask when staff or their visitor are in the room. Masking is required regardless of vaccination status.

## 2020-11-20 ENCOUNTER — Other Ambulatory Visit
Admission: RE | Admit: 2020-11-20 | Discharge: 2020-11-20 | Disposition: A | Discharge status: Home / Self Care | Payer: Medicare Other | Source: Ambulatory Visit | Attending: Pulmonary Disease | Admitting: Pulmonary Disease

## 2020-11-20 DIAGNOSIS — Z20822 Contact with and (suspected) exposure to covid-19: Secondary | ICD-10-CM | POA: Insufficient documentation

## 2020-11-20 DIAGNOSIS — Z01818 Encounter for other preprocedural examination: Secondary | ICD-10-CM | POA: Insufficient documentation

## 2020-11-20 LAB — CBC
HCT: 39.7 % (ref 36.0–46.0)
Hemoglobin: 13.7 g/dL (ref 12.0–15.0)
MCH: 31.6 pg (ref 26.0–34.0)
MCHC: 34.5 g/dL (ref 30.0–36.0)
MCV: 91.7 fL (ref 80.0–100.0)
Platelets: 242 10*3/uL (ref 150–400)
RBC: 4.33 MIL/uL (ref 3.87–5.11)
RDW: 13 % (ref 11.5–15.5)
WBC: 9.1 10*3/uL (ref 4.0–10.5)
nRBC: 0 % (ref 0.0–0.2)

## 2020-11-20 LAB — PROTIME-INR
INR: 1 (ref 0.8–1.2)
Prothrombin Time: 13.1 seconds (ref 11.4–15.2)

## 2020-11-20 LAB — APTT: aPTT: 35 seconds (ref 24–36)

## 2020-11-20 MED ORDER — ORAL CARE MOUTH RINSE: 15.0000 mL | Freq: Once | OROMUCOSAL | Status: AC

## 2020-11-20 MED ORDER — FAMOTIDINE 20 MG PO TABS
20.0000 mg | ORAL_TABLET | Freq: Once | ORAL | Status: AC
  Administered 2020-11-21: 20 mg via ORAL

## 2020-11-20 MED ORDER — LACTATED RINGERS IV SOLN: INTRAVENOUS | Status: DC

## 2020-11-20 MED ORDER — CHLORHEXIDINE GLUCONATE 0.12 % MT SOLN
15.0000 mL | Freq: Once | OROMUCOSAL | Status: AC
  Administered 2020-11-21: 15 mL via OROMUCOSAL

## 2020-11-21 ENCOUNTER — Ambulatory Visit: Payer: Medicare Other | Admitting: Registered Nurse

## 2020-11-21 ENCOUNTER — Ambulatory Visit: Payer: Medicare Other

## 2020-11-21 ENCOUNTER — Encounter
Admission: RE | Disposition: A | Discharge status: Home / Self Care | Payer: Self-pay | Source: Home / Self Care | Attending: Pulmonary Disease

## 2020-11-21 ENCOUNTER — Encounter: Payer: Self-pay | Admitting: *Deleted

## 2020-11-21 ENCOUNTER — Other Ambulatory Visit: Payer: Self-pay

## 2020-11-21 ENCOUNTER — Ambulatory Visit
Admission: RE | Admit: 2020-11-21 | Discharge: 2020-11-21 | Disposition: A | Discharge status: Home / Self Care | Payer: Medicare Other | Attending: Pulmonary Disease | Admitting: Pulmonary Disease

## 2020-11-21 DIAGNOSIS — R911 Solitary pulmonary nodule: Secondary | ICD-10-CM | POA: Insufficient documentation

## 2020-11-21 DIAGNOSIS — Z881 Allergy status to other antibiotic agents status: Secondary | ICD-10-CM | POA: Diagnosis not present

## 2020-11-21 DIAGNOSIS — F17210 Nicotine dependence, cigarettes, uncomplicated: Secondary | ICD-10-CM | POA: Insufficient documentation

## 2020-11-21 DIAGNOSIS — Z888 Allergy status to other drugs, medicaments and biological substances status: Secondary | ICD-10-CM | POA: Insufficient documentation

## 2020-11-21 DIAGNOSIS — Z885 Allergy status to narcotic agent status: Secondary | ICD-10-CM | POA: Diagnosis not present

## 2020-11-21 DIAGNOSIS — Z880 Allergy status to penicillin: Secondary | ICD-10-CM | POA: Insufficient documentation

## 2020-11-21 HISTORY — PX: VIDEO BRONCHOSCOPY WITH ENDOBRONCHIAL NAVIGATION: SHX6175

## 2020-11-21 HISTORY — PX: VIDEO BRONCHOSCOPY WITH ENDOBRONCHIAL ULTRASOUND: SHX6177

## 2020-11-21 LAB — SARS CORONAVIRUS 2 (TAT 6-24 HRS): SARS Coronavirus 2: NEGATIVE

## 2020-11-21 SURGERY — VIDEO BRONCHOSCOPY WITH ENDOBRONCHIAL NAVIGATION
Anesthesia: General

## 2020-11-21 MED ORDER — FENTANYL CITRATE (PF) 100 MCG/2ML IJ SOLN
INTRAMUSCULAR | Status: DC | PRN
  Administered 2020-11-21 (×2): 50 ug via INTRAVENOUS

## 2020-11-21 MED ORDER — LIDOCAINE HCL (CARDIAC) PF 100 MG/5ML IV SOSY
PREFILLED_SYRINGE | INTRAVENOUS | Status: DC | PRN
  Administered 2020-11-21: 40 mg via INTRAVENOUS

## 2020-11-21 MED ORDER — ONDANSETRON HCL 4 MG/2ML IJ SOLN
INTRAMUSCULAR | Status: DC | PRN
  Administered 2020-11-21: 4 mg via INTRAVENOUS

## 2020-11-21 MED ORDER — SUGAMMADEX SODIUM 200 MG/2ML IV SOLN
INTRAVENOUS | Status: DC | PRN
  Administered 2020-11-21: 200 mg via INTRAVENOUS

## 2020-11-21 MED ORDER — PHENYLEPHRINE HCL 0.25 % NA SOLN
1.0000 | Freq: Four times a day (QID) | NASAL | Status: DC | PRN
  Filled 2020-11-21: qty 15

## 2020-11-21 MED ORDER — DEXAMETHASONE SODIUM PHOSPHATE 10 MG/ML IJ SOLN
INTRAMUSCULAR | Status: AC
  Filled 2020-11-21: qty 1

## 2020-11-21 MED ORDER — PROPOFOL 10 MG/ML IV BOLUS
INTRAVENOUS | Status: AC
  Filled 2020-11-21: qty 20

## 2020-11-21 MED ORDER — EPHEDRINE SULFATE 50 MG/ML IJ SOLN
INTRAMUSCULAR | Status: DC | PRN
  Administered 2020-11-21: 15 mg via INTRAVENOUS
  Administered 2020-11-21 (×2): 10 mg via INTRAVENOUS

## 2020-11-21 MED ORDER — DEXAMETHASONE SODIUM PHOSPHATE 10 MG/ML IJ SOLN
INTRAMUSCULAR | Status: DC | PRN
  Administered 2020-11-21: 10 mg via INTRAVENOUS

## 2020-11-21 MED ORDER — LIDOCAINE HCL URETHRAL/MUCOSAL 2 % EX GEL
1.0000 "application " | Freq: Once | CUTANEOUS | Status: DC
  Filled 2020-11-21: qty 5

## 2020-11-21 MED ORDER — LIDOCAINE HCL (PF) 2 % IJ SOLN
INTRAMUSCULAR | Status: AC
  Filled 2020-11-21: qty 5

## 2020-11-21 MED ORDER — LIDOCAINE HCL (PF) 1 % IJ SOLN
30.0000 mL | Freq: Once | INTRAMUSCULAR | Status: DC
  Filled 2020-11-21: qty 30

## 2020-11-21 MED ORDER — ONDANSETRON HCL 4 MG/2ML IJ SOLN
INTRAMUSCULAR | Status: AC
  Filled 2020-11-21: qty 2

## 2020-11-21 MED ORDER — FENTANYL CITRATE (PF) 100 MCG/2ML IJ SOLN
INTRAMUSCULAR | Status: AC
  Filled 2020-11-21: qty 2

## 2020-11-21 MED ORDER — EPHEDRINE 5 MG/ML INJ
INTRAVENOUS | Status: AC
  Filled 2020-11-21: qty 10

## 2020-11-21 MED ORDER — ROCURONIUM BROMIDE 10 MG/ML (PF) SYRINGE
PREFILLED_SYRINGE | INTRAVENOUS | Status: AC
  Filled 2020-11-21: qty 10

## 2020-11-21 MED ORDER — ROCURONIUM BROMIDE 100 MG/10ML IV SOLN
INTRAVENOUS | Status: DC | PRN
  Administered 2020-11-21: 10 mg via INTRAVENOUS
  Administered 2020-11-21: 40 mg via INTRAVENOUS
  Administered 2020-11-21: 10 mg via INTRAVENOUS

## 2020-11-21 MED ORDER — PROPOFOL 10 MG/ML IV BOLUS
INTRAVENOUS | Status: DC | PRN
  Administered 2020-11-21: 130 mg via INTRAVENOUS

## 2020-11-21 MED ORDER — BUTAMBEN-TETRACAINE-BENZOCAINE 2-2-14 % EX AERO
1.0000 | INHALATION_SPRAY | Freq: Once | CUTANEOUS | Status: DC
  Filled 2020-11-21: qty 20

## 2020-11-21 NOTE — H&P (Signed)
Pulmonary Medicine          Date: 11/21/2020,   MRN# [id number] Beth Mcmillan 06/20/1946     Admission                  Current       CHIEF COMPLAINT:   Left lung nodule suspicious for bronchogenic carcinoma   HISTORY OF PRESENT ILLNESS   Ms. Beth Mcmillan is a very pleasant 75 year old female with a history of dyslipidemia anxiety, essential hypertension, history of lifelong smoking with COPD.  She was seen in outpatient pulmonology and had full pulmonary function test with most recent forced expiratory volume at 1.28 L 53% predicted which was done November 11, 2020.  She does cough phlegm daily and has had significant fatigue with dyspnea on exertion.  Her COPD assessment test score was 18 during my evaluation in the office.  She is currently breathless with ambulation with an mMRC of 3.  Reviewed her CT chest with findings of lung nodule suspicious for bronchogenic carcinoma and made plans for additional tissue sampling for definitive diagnosis via bronchoscopic biopsy including electromagnetic navigational bronchoscopy and potential endobronchial ultrasound assisted lymph node biopsies.  Repeat CT super D with findings ofLobulated left lower lobe pulmonary nodule measures. 2.7 x 2.6 x 2.3 cmLobulated left lower lobe pulmonary nodule.  Patient is here today for tissue sampling via bronchoscopy and is agreeable to proceed.  She has not had any new findings.  She has not been on any anticoagulation, Plavix or aspirin the other antiplatelet agent.  She understands of the complications including bleeding, infection, pneumothorax and rarely death.      PAST MEDICAL HISTORY   Past Medical History:  Diagnosis Date  . Anxiety   . COPD (chronic obstructive pulmonary disease) (HCC)    EMPHYSEMA  . Dysrhythmia    tachycardia  . Hemorrhoid   . History of hiatal hernia    SMALL- 2022  . Hypercholesteremia   . Hypertension   . Hypothyroidism   . Larynx polyp   . Palpitations     with anxiety  . Platelets decreased ([place]) 2020  . Tinnitus   . Vertigo    no episodes in several years  . Wears dentures    partial lower  . White coat syndrome with hypertension      SURGICAL HISTORY   Past Surgical History:  Procedure Laterality Date  . ABDOMINAL HYSTERECTOMY     partial  . COLONOSCOPY WITH PROPOFOL N/A 01/22/2016   Procedure: COLONOSCOPY WITH PROPOFOL;  Surgeon: Lucilla Lame, MD;  Location: [facility];  Service: Endoscopy;  Laterality: N/A;  . COLONOSCOPY WITH PROPOFOL N/A 04/12/2019   Procedure: COLONOSCOPY WITH BIOPSY;  Surgeon: Lucilla Lame, MD;  Location: [facility];  Service: Endoscopy;  Laterality: N/A;  pt would like an early appt  . POLYPECTOMY  01/22/2016   Procedure: POLYPECTOMY;  Surgeon: Lucilla Lame, MD;  Location: [facility];  Service: Endoscopy;;  . POLYPECTOMY N/A 04/12/2019   Procedure: POLYPECTOMY;  Surgeon: Lucilla Lame, MD;  Location: [facility];  Service: Endoscopy;  Laterality: N/A;     FAMILY HISTORY   Family History  Problem Relation Age of Onset  . Breast cancer Neg Hx      SOCIAL HISTORY   Social History   Tobacco Use  . Smoking status: Current Every Day Smoker    Packs/day: 0.50    Years: 50.00    Pack years: 25.00    Types: Cigarettes  .  Smokeless tobacco: Never Used  Vaping Use  . Vaping Use: Some days  Substance Use Topics  . Alcohol use: No  . Drug use: Never     MEDICATIONS    Home Medication:    Current Medication:  Current Facility-Administered Medications:  .  chlorhexidine (PERIDEX) 0.12 % solution 15 mL, 15 mL, Mouth/Throat, Once **OR** MEDLINE mouth rinse, 15 mL, Mouth Rinse, Once, Piscitello, Precious Haws, MD .  famotidine (PEPCID) tablet 20 mg, 20 mg, Oral, Once, Honor Loh E, NP .  lactated ringers infusion, , Intravenous, Continuous, Piscitello, Precious Haws, MD    ALLERGIES   Augmentin [amoxicillin-pot clavulanate], Codeine, Levaquin [levofloxacin], and  Tape     REVIEW OF SYSTEMS    Review of Systems:  Gen:  Denies  fever, sweats, chills weigh loss  HEENT: Denies blurred vision, double vision, ear pain, eye pain, hearing loss, nose bleeds, sore throat Cardiac:  No dizziness, chest pain or heaviness, chest tightness,edema Resp:   Denies cough or sputum porduction, shortness of breath,wheezing, hemoptysis,  Gi: Denies swallowing difficulty, stomach pain, nausea or vomiting, diarrhea, constipation, bowel incontinence Gu:  Denies bladder incontinence, burning urine Ext:   Denies Joint pain, stiffness or swelling Skin: Denies  skin rash, easy bruising or bleeding or hives Endoc:  Denies polyuria, polydipsia , polyphagia or weight change Psych:   Denies depression, insomnia or hallucinations   Other:  All other systems negative   VS: There were no vitals taken for this visit.     PHYSICAL EXAM    GENERAL:NAD, no fevers, chills, no weakness no fatigue HEAD: Normocephalic, atraumatic.  EYES: Pupils equal, round, reactive to light. Extraocular muscles intact. No scleral icterus.  MOUTH: Moist mucosal membrane. Dentition intact. No abscess noted.  EAR, NOSE, THROAT: Clear without exudates. No external lesions.  NECK: Supple. No thyromegaly. No nodules. No JVD.  PULMONARY: Diffuse coarse rhonchi right sided +wheezes CARDIOVASCULAR: S1 and S2. Regular rate and rhythm. No murmurs, rubs, or gallops. No edema. Pedal pulses 2+ bilaterally.  GASTROINTESTINAL: Soft, nontender, nondistended. No masses. Positive bowel sounds. No hepatosplenomegaly.  MUSCULOSKELETAL: No swelling, clubbing, or edema. Range of motion full in all extremities.  NEUROLOGIC: Cranial nerves II through XII are intact. No gross focal neurological deficits. Sensation intact. Reflexes intact.  SKIN: No ulceration, lesions, rashes, or cyanosis. Skin warm and dry. Turgor intact.  PSYCHIATRIC: Mood, affect within normal limits. The patient is awake, alert and oriented x 3.  Insight, judgment intact.       IMAGING    CT CHEST WO CONTRAST  Result Date: 11/19/2020 CLINICAL DATA:  Left lower lobe lung nodule. EXAM: CT CHEST WITHOUT CONTRAST TECHNIQUE: Multidetector CT imaging of the chest was performed following the standard protocol without IV contrast. COMPARISON:  Chest CT 15 days ago 11/04/2020 FINDINGS: Cardiovascular: Aortic atherosclerosis. No aortic aneurysm. Heart is normal in size. There are coronary artery calcifications. No pericardial effusion. Mediastinum/Nodes: Hilar adenopathy is not accurately assessed on this noncontrast exam. There are scattered small mediastinal lymph nodes are not enlarged by size criteria, and stable. Tiny hiatal hernia. No thyroid nodule. Lungs/Pleura: Lobulated left lower lobe pulmonary nodule measures 2.7 x 2.6 x 2.3 cm, not significantly changed allowing for differences in caliper placement. Margins are microlobulated. Nodule is slightly central low-density. No internal air. Minimal adjacent linear opacities likely atelectasis. Additional scattered subsegmental linear atelectasis or scarring in the right middle lobe and lingula. No other pulmonary nodule. Mild emphysema. Mild biapical pleuroparenchymal scarring. Improved retained secretions in the trachea.  No pleural fluid. Upper Abdomen: Minimal adrenal thickening without focal nodule. No acute upper abdominal findings. Musculoskeletal: No focal bone lesion or acute osseous abnormality. No chest wall soft tissue abnormality. IMPRESSION: 1. Left lower lobe pulmonary nodule measuring 2.7 cm, not significantly changed in size over the past 15 days. This is suspicious for primary bronchogenic malignancy. Recommend further evaluation with PET/CT or tissue sampling. 2. No mediastinal adenopathy. Hilar adenopathy is not accurately assessed on this noncontrast exam. No hilar adenopathy is seen on recent contrast-enhanced exam. 3. Emphysema. 4. Coronary artery calcifications. Aortic  Atherosclerosis (ICD10-I70.0) and Emphysema (ICD10-J43.9). Electronically Signed   By: Keith Rake M.D.   On: 11/19/2020 20:19   CT CHEST W CONTRAST  Result Date: 11/05/2020 CLINICAL DATA:  Abnormal chest x-ray EXAM: CT CHEST WITH CONTRAST TECHNIQUE: Multidetector CT imaging of the chest was performed during intravenous contrast administration. CONTRAST:  43mL OMNIPAQUE IOHEXOL 300 MG/ML  SOLN COMPARISON:  None. FINDINGS: Cardiovascular: Normal heart size. No pericardial effusion. Coronary artery calcification. Thoracic aorta is normal in caliber with moderate calcified plaque. Mediastinum/Nodes: Central airways are patent. Focal layering density in the trachea likely reflects retained secretions. No enlarged lymph nodes. Lungs/Pleura: There is a 2.6 x 2.6 x 2.6 cm mass of the left lower lobe. Linear scarring/atelectasis at the lung bases and right middle lobe and lingula. Mild emphysema. Upper Abdomen: Small hiatal hernia.  No acute abnormality. Musculoskeletal: No focal sclerotic or destructive osseous lesion. IMPRESSION: 2.6 cm left lower lobe mass. Biopsy recommended. No enlarged nodes. Small hiatal hernia. Aortic Atherosclerosis (ICD10-I70.0) and Emphysema (ICD10-J43.9). These results will be called to the ordering clinician or representative by the Radiologist Assistant, and communication documented in the PACS or [employer]. Electronically Signed   By: Macy Mis M.D.   On: 11/05/2020 14:43      ASSESSMENT/PLAN   Lobulated left lower lobe pulmonary nodule measures 2.7 x 2.6 x 2.3 cm - patient here today for tissue sampling via bronchoscopy including electromagnetic navigational bronchoscopy and possible EBUS   -Reviewed risks/complications and benefits with patient, risks include infection, pneumothorax/pneumomediastinum which may require chest tube placement as well as overnight/prolonged hospitalization and possible mechanical ventilation. Other risks include bleeding and very  rarely death.  Patient understands risks and wishes to proceed.  Additional questions were answered, and patient is aware that post procedure patient will be going home with family and may experience cough with possible clots on expectoration as well as phlegm which may last few days as well as hoarseness of voice post intubation and mechanical ventilation. -Patient understands and wished to proceed.    Thank you for allowing me to participate in the care of this patient.   Patient/Family are satisfied with care plan and all questions have been answered.  This document was prepared using Dragon voice recognition software and may include unintentional dictation errors.     Ottie Glazier, M.D.  Division of [place]

## 2020-11-21 NOTE — Discharge Instructions (Signed)
Flexible Bronchoscopy  Flexible bronchoscopy is a procedure used to examine the passageways in the lungs. During the procedure, a thin, flexible tool with a camera (bronchoscope) is passed into the mouth or nose, down through the windpipe (trachea), and into the air tubes in the lungs (bronchi). This tool allows the health care provider to look inside the lungs and to take samples for testing, if needed. Tell a health care provider about:  Any allergies you have.  All medicines you are taking, including vitamins, herbs, eye drops, creams, and over-the-counter medicines.  Any problems you or family members have had with anesthetic medicines.  Any blood disorders you have.  Any surgeries you have had.  Any medical conditions you have.  Whether you are pregnant or may be pregnant. What are the risks? Generally, this is a safe procedure. However, problems may occur, including:  Infection.  Bleeding.  Damage to other structures or organs.  Allergic reactions to medicines.  Collapsed lung (pneumothorax).  Increased need for oxygen or difficulty breathing after the procedure. What happens before the procedure? Staying hydrated Follow instructions from your health care provider about hydration, which may include:  Up to 2 hours before the procedure - you may continue to drink clear liquids, such as water, clear fruit juice, black coffee, and plain tea.   Eating and drinking restrictions Follow instructions from your health care provider about eating and drinking, which may include:  8 hours before the procedure - stop eating heavy meals or foods, such as meat, fried foods, or fatty foods.  6 hours before the procedure - stop eating light meals or foods, such as toast or cereal.  6 hours before the procedure - stop drinking milk or drinks that contain milk.  2 hours before the procedure - stop drinking clear liquids. Medicines Ask your health care provider about:  Changing or  stopping your regular medicines. This is especially important if you are taking diabetes medicines or blood thinners.  Taking medicines such as aspirin and ibuprofen. These medicines can thin your blood. Do not take these medicines unless your health care provider tells you to take them.  Taking over-the-counter medicines, vitamins, herbs, and supplements. General instructions  You may be given antibiotic medicine to help lower the risk of infection.  Plan to have a responsible adult take you home from the hospital or clinic.  If you will be going home right after the procedure, plan to have a responsible adult care for you for the time you are told. This is important. What happens during the procedure?  An IV will be inserted into one of your veins.  You will be given a medicine (local anesthetic) to numb your mouth, nose, throat, and voice box (larynx). You may also be given one or more of the following: ? A medicine to help you relax (sedative). ? A medicine to control coughing. ? A medicine to dry up any fluids or secretions in your lungs.  A bronchoscope will be passed into your nose or mouth, and into your lungs. Your health care provider will examine your lungs.  Samples of airway secretions may be collected for testing.  If abnormal areas are seen in your airways, samples of tissue may be removed and checked under a microscope (biopsy).  If tissue samples are needed from the outer parts of the lung, a type of X-ray (fluoroscopy) may be used to guide the bronchoscope to these areas.  If bleeding occurs, you may be given medicine to  stop or decrease the bleeding. The procedure may vary among health care providers and hospitals. What can I expect after the procedure?  Your blood pressure, heart rate, breathing rate, and blood oxygen level will be monitored until you leave the hospital or clinic.  You may have a chest X-ray to check for signs of pneumothorax.  You willnot be  allowed to eat or drink anything for 2 hours after your procedure.  If a biopsy was taken, it is up to you to get the results of the test. Ask your health care provider, or the department that is doing the procedure, when your results will be ready.  You may have the following symptoms for 24-48 hours: ? A cough that is worse than it was before the procedure. ? A low-grade fever. ? A sore throat or hoarse voice. ? Some blood in the mucus from your lungs (sputum), if a biopsy was done. Follow these instructions at home: Eating and drinking  Do not eat or drink anything, including water, for 2 hours after your procedure, or until your numbing medicine has worn off. Having a numb throat increases your risk of burning yourself or choking.  Start eating soft foods and slowly drinking liquids after your numbness is gone and your cough and gag reflexes have returned.  You may return to your normal diet the day after the procedure. Driving  If you were given a sedative during the procedure, it can affect you for several hours. Do not drive or operate machinery until your health care provider says that it is safe.  Ask your health care provider if the medicine prescribed to you requires you to avoid driving or using machinery.  Return to your normal activities as told by your health care provider. Ask your health care provider what activities are safe for you. General instructions  Take over-the-counter and prescription medicines only as told by your health care provider.  Do not use any products that contain nicotine or tobacco. These products include cigarettes, chewing tobacco, and vaping devices, such as e-cigarettes. If you need help quitting, ask your health care provider.  Keep all follow-up visits. This is important.   Get help right away if:  You have shortness of breath that gets worse.  You become light-headed or feel like you might faint.  You have chest pain.  You cough up  more than a small amount of blood. These symptoms may represent a serious problem that is an emergency. Do not wait to see if the symptoms will go away. Get medical help right away. Call your local emergency services (911 in the [place] Do not drive yourself to the hospital. Summary  Flexible bronchoscopy is a procedure that allows your health care provider to look closely inside your lungs and to take testing samples if needed.  Risks of flexible bronchoscopy include bleeding, infection, and collapsed lung (pneumothorax).  Before the procedure, you will be given a medicine to numb your mouth, nose, throat, and voice box. Then, a bronchoscope will be passed into your nose or mouth, and into your lungs.  After the procedure, your blood pressure, heart rate, breathing rate, and blood oxygen level will be monitored until you leave the hospital or clinic. You may have a chest X-ray to check for signs of pneumothorax.  You will not be allowed to eat or drink anything for 2 hours after your procedure. This information is not intended to replace advice given to you by your health care provider.  Make sure you discuss any questions you have with your health care provider. Document Revised: 02/28/2020 Document Reviewed: 02/28/2020 Elsevier Patient Education  2021 [place]   1) The drugs that you were given will stay in your system until tomorrow so for the next 24 hours you should not:  A) Drive an automobile B) Make any legal decisions C) Drink any alcoholic beverage   2) You may resume regular meals tomorrow.  Today it is better to start with liquids and gradually work up to solid foods.  You may eat anything you prefer, but it is better to start with liquids, then soup and crackers, and gradually work up to solid foods.   3) Please notify your doctor immediately if you have any unusual bleeding, trouble breathing, redness and pain at the  surgery site, drainage, fever, or pain not relieved by medication.    4) Additional Instructions:        Please contact your physician with any problems or Same Day Surgery at [phone], Monday through Friday 6 am to 4 pm, or [place] at [facility] number at [phone].

## 2020-11-21 NOTE — Anesthesia Preprocedure Evaluation (Signed)
Anesthesia Evaluation  Patient identified by MRN, date of birth, ID band Patient awake    Reviewed: Allergy & Precautions, NPO status , Patient's Chart, lab work & pertinent test results  History of Anesthesia Complications Negative for: history of anesthetic complications  Airway Mallampati: II  TM Distance: >3 FB Neck ROM: Full    Dental  (+) Partial Lower   Pulmonary COPD, Current Smoker and Patient abstained from smoking.,    breath sounds clear to auscultation- rhonchi (-) wheezing      Cardiovascular hypertension, Pt. on medications (-) CAD, (-) Past MI, (-) Cardiac Stents and (-) CABG  Rhythm:Regular Rate:Normal - Systolic murmurs and - Diastolic murmurs    Neuro/Psych neg Seizures Anxiety negative neurological ROS     GI/Hepatic Neg liver ROS, hiatal hernia,   Endo/Other  neg diabetesHypothyroidism   Renal/GU negative Renal ROS     Musculoskeletal negative musculoskeletal ROS (+)   Abdominal (+) - obese,   Peds  Hematology negative hematology ROS (+)   Anesthesia Other Findings Past Medical History: No date: Anxiety No date: COPD (chronic obstructive pulmonary disease) (HCC)     Comment:  EMPHYSEMA No date: Dysrhythmia     Comment:  tachycardia No date: Hemorrhoid No date: History of hiatal hernia     Comment:  SMALL- 2022 No date: Hypercholesteremia No date: Hypertension No date: Hypothyroidism No date: Larynx polyp No date: Palpitations     Comment:  with anxiety 2020: Platelets decreased (HCC) No date: Tinnitus No date: Vertigo     Comment:  no episodes in several years No date: Wears dentures     Comment:  partial lower No date: White coat syndrome with hypertension   Reproductive/Obstetrics                             Anesthesia Physical Anesthesia Plan  ASA: III  Anesthesia Plan: General   Post-op Pain Management:    Induction: Intravenous  PONV Risk  Score and Plan: 1 and Ondansetron and Dexamethasone  Airway Management Planned: Oral ETT  Additional Equipment:   Intra-op Plan:   Post-operative Plan: Extubation in OR  Informed Consent: I have reviewed the patients History and Physical, chart, labs and discussed the procedure including the risks, benefits and alternatives for the proposed anesthesia with the patient or authorized representative who has indicated his/her understanding and acceptance.     Dental advisory given  Plan Discussed with: CRNA and Anesthesiologist  Anesthesia Plan Comments:        Anesthesia Quick Evaluation

## 2020-11-21 NOTE — Procedures (Signed)
ELECTROMAGNETIC NAVIGATIONAL BRONCHOSCOPY PROCEDURE NOTE  FIBEROPTIC BRONCHOSCOPY WITH BRONCHOALVEOLAR LAVAGE PROCEDURE NOTE     Flexible bronchoscopy was performed  by : Lanney Gins MD  assistance by : 1)Repiratory therapist  and 2)[employer] staff and 3) Anesthesia team and 4) Flouroscopy team and 5) [employer] supporting staff   Indication for the procedure was :  Pre-procedural H&P. The following assessment was performed on the day of the procedure prior to initiating sedation History:  Chest pain n Dyspnea y Hemoptysis n Cough y Fever n Other pertinent items n  Examination Vital signs -reviewed as per nursing documentation today Cardiac    Murmurs: n  Rubs : n  Gallop: n Lungs Wheezing: n Rales : n Rhonchi :y  Other pertinent findings: SOB/hypoxemia due to chronic lung disease   Pre-procedural assessment for Procedural Sedation included: Depth of sedation: As per anesthesia team  ASA Classification:  2 Mallampati airway assessment: 3    Medication list reviewed: y  The patient's interval history was taken and revealed: no new complaints The pre- procedure physical examination revealed: No new findings Refer to prior clinic note for details.  Informed Consent: Informed consent was obtained from:  patient after explanation of procedure and risks, benefits, as well as alternative procedures available.  Explanation of level of sedation and possible transfusion was also provided.    Procedural Preparation: Time out was performed and patient was identified by name and birthdate and procedure to be performed and side for sampling, if any, was specified. Pt was intubated by anesthesia.  The patient was appropriately draped.   Fiberoptic bronchoscopy with airway inspection and BAL Procedure findings:  Bronchoscope was inserted via ETT  without difficulty.  Posterior oropharynx, epiglottis, arytenoids, false cords and vocal cords were not visualized as these  were bypassed by endotracheal tube. The distal trachea was normal in circumference and appearance without mucosal, cartilaginous or branching abnormalities.  The main carina was mildly splayed . All right and left lobar airways were visualized to the Subsegmental level.  Sub- sub segmental carinae were identified in all the distal airways.   Secretions were visible in the following airways and appeared to be clear.  The mucosa was :normal  Airways were notable for:        exophytic lesions :n       extrinsic compression in the following distributions: n.       Friable mucosa: y       Neurosurgeon /pigmentation: n     Post procedure Diagnosis:   Thick phlegm throughout bilaterally consistent with chronic bronchitis     Electromagnetic Navigational Bronchoscopy Procedure Findings:  After appropriate CT-guided planning ENB scope was advanced via endotracheal tube and LG was advanced for registration.  Post appropriate planning and registration peripheral navigation was used to visualize target lesion.    LLL lesion was localized and had brushings x 6 as well as biopsy - this was done 5 times then scope was readjusted to confirm placement then another 5 biopsies then again scope placement reconfirmation with 5 additional biopsies.  All biopsies under direct visual with flouorscopy.   Post procedure diagnosis: -  Necrotic material with atypical features suggestive of malignancy       Endobronchial ultrasound assisted hilar and mediastinal lymph node biopsies procedure findings: The fiberoptic bronchoscope was removed and the EBUS scope was introduced. Examination began to evaluate for pathologically enlarged lymph nodes starting on the R side progressing to the L side.    Lymph node stations 11L  was visualized to be 0.6cm and appeared normal in shape and size this was not biopsied. Station 4L visualized under [place] to be 0.7cm normal shape with healthy appearing fatty core biopsy not  performed on this station, next station.  Subcarinal station 7 was visualized and appeared subcentimeter with normal shape not biopsied.  EBUS assisted LN biopsies not performed after direct measurement of lymph nodes via [place].   Post procedure diagnosis:  Normal Lymph node imaging.   Specimens obtained included:                   Cytology brushes : LLL  Broncho-alveolar lavage site:LLL  sent for cytology                              1m volume infused 319mvolume returned with bloodly  appearance *   Fluoroscopy Used: yes ;        Pictorial documentation attached: none             Immediate sampling complications included:none  Epinephrine none ml was used topically  The bronchoscopy was terminated due to completion of the planned procedure and the bronchoscope was removed.   Total dosage of Lidocaine was none mg Total fluoroscopy time was 1 minutes    Estimated Blood loss: expected 3cc.  Complications included: none   Disposition: home  Follow up with Dr. AlLanney Ginsn 5 days for result discussion.     FuOttie GlazierD  [facility]

## 2020-11-21 NOTE — Anesthesia Procedure Notes (Signed)
Procedure Name: Intubation Date/Time: 11/21/2020 12:27 PM Performed by: Jonna Clark, CRNA Pre-anesthesia Checklist: Patient identified, Patient being monitored, Timeout performed, Emergency Drugs available and Suction available Patient Re-evaluated:Patient Re-evaluated prior to induction Oxygen Delivery Method: Circle system utilized Preoxygenation: Pre-oxygenation with 100% oxygen Induction Type: IV induction Ventilation: Mask ventilation without difficulty Laryngoscope Size: 3 and McGraph Grade View: Grade I Tube type: Oral Tube size: 8.5 mm Number of attempts: 1 Airway Equipment and Method: Stylet Placement Confirmation: ETT inserted through vocal cords under direct vision,  positive ETCO2 and breath sounds checked- equal and bilateral Secured at: 22 cm Tube secured with: Tape Dental Injury: Teeth and Oropharynx as per pre-operative assessment

## 2020-11-21 NOTE — Transfer of Care (Signed)
Immediate Anesthesia Transfer of Care Note  Patient: Beth Mcmillan  Procedure(s) Performed: VIDEO BRONCHOSCOPY WITH ENDOBRONCHIAL NAVIGATION (N/A ) VIDEO BRONCHOSCOPY WITH ENDOBRONCHIAL ULTRASOUND (N/A )  Patient Location: PACU  Anesthesia Type:General  Level of Consciousness: drowsy and patient cooperative  Airway & Oxygen Therapy: Patient Spontanous Breathing and Patient connected to face mask oxygen  Post-op Assessment: Report given to RN and Post -op Vital signs reviewed and stable  Post vital signs: Reviewed and stable  Last Vitals:  Vitals Value Taken Time  BP 139/56 11/21/20 1415  Temp 36.3 C 11/21/20 1415  Pulse 70 11/21/20 1418  Resp 14 11/21/20 1418  SpO2 100 % 11/21/20 1418  Vitals shown include unvalidated device data.  Last Pain:  Vitals:   11/21/20 1415  PainSc: 0-No pain         Complications: No complications documented.

## 2020-11-24 ENCOUNTER — Encounter: Payer: Self-pay | Admitting: Pulmonary Disease

## 2020-11-24 LAB — CYTOLOGY - NON PAP

## 2020-11-24 LAB — SURGICAL PATHOLOGY

## 2020-11-24 NOTE — Anesthesia Postprocedure Evaluation (Signed)
Anesthesia Post Note  Patient: [name]  Procedure(s) Performed: VIDEO BRONCHOSCOPY WITH ENDOBRONCHIAL NAVIGATION (N/A ) VIDEO BRONCHOSCOPY WITH ENDOBRONCHIAL ULTRASOUND (N/A )  Patient location during evaluation: PACU Anesthesia Type: General Level of consciousness: awake and alert Pain management: pain level controlled Vital Signs Assessment: post-procedure vital signs reviewed and stable Respiratory status: spontaneous breathing, nonlabored ventilation, respiratory function stable and patient connected to nasal cannula oxygen Cardiovascular status: blood pressure returned to baseline and stable Postop Assessment: no apparent nausea or vomiting Anesthetic complications: no   No complications documented.   Last Vitals:  Vitals:   11/21/20 1446 11/21/20 1458  BP: (!) 134/54 124/60  Pulse: 77 69  Resp: 18 18  Temp: 36.7 C   SpO2: 93% 96%    Last Pain:  Vitals:   11/24/20 0856  PainSc: 0-No pain                 Martha Clan

## 2020-11-27 ENCOUNTER — Other Ambulatory Visit: Payer: Self-pay

## 2020-11-27 ENCOUNTER — Encounter
Admission: RE | Admit: 2020-11-27 | Discharge: 2020-11-27 | Disposition: A | Discharge status: Home / Self Care | Payer: Medicare Other | Source: Ambulatory Visit | Attending: Pulmonary Disease | Admitting: Pulmonary Disease

## 2020-11-27 DIAGNOSIS — I2510 Atherosclerotic heart disease of native coronary artery without angina pectoris: Secondary | ICD-10-CM | POA: Diagnosis not present

## 2020-11-27 DIAGNOSIS — R911 Solitary pulmonary nodule: Secondary | ICD-10-CM | POA: Diagnosis present

## 2020-11-27 DIAGNOSIS — I700 Atherosclerosis of aorta: Secondary | ICD-10-CM | POA: Diagnosis not present

## 2020-11-27 LAB — GLUCOSE, CAPILLARY: Glucose-Capillary: 85 mg/dL (ref 70–99)

## 2020-11-27 MED ORDER — FLUDEOXYGLUCOSE F - 18 (FDG) INJECTION
7.4100 | Freq: Once | INTRAVENOUS | Status: AC | PRN
  Administered 2020-11-27: 7.41 via INTRAVENOUS

## 2020-11-28 ENCOUNTER — Other Ambulatory Visit: Payer: Self-pay | Admitting: Radiology

## 2020-11-28 ENCOUNTER — Other Ambulatory Visit: Payer: Self-pay | Admitting: Pulmonary Disease

## 2020-11-28 DIAGNOSIS — R918 Other nonspecific abnormal finding of lung field: Secondary | ICD-10-CM

## 2020-11-28 DIAGNOSIS — R911 Solitary pulmonary nodule: Secondary | ICD-10-CM

## 2020-11-28 NOTE — Progress Notes (Signed)
I SPOKE WITH DR Wallene Dales FOR PROCEDURE AND I SPOKE WITH PATIENTS DAUGHTER PER PATIENTS REQUEST.  THEY ARE READY  TO PROCEED WITH CT GUIDED BIOPSY OF LUNG NODULE

## 2020-12-03 ENCOUNTER — Other Ambulatory Visit: Payer: Self-pay

## 2020-12-03 ENCOUNTER — Other Ambulatory Visit
Admission: RE | Admit: 2020-12-03 | Discharge: 2020-12-03 | Disposition: A | Discharge status: Home / Self Care | Payer: Medicare Other | Source: Ambulatory Visit | Attending: Pulmonary Disease | Admitting: Pulmonary Disease

## 2020-12-03 ENCOUNTER — Other Ambulatory Visit: Payer: Self-pay | Admitting: Radiology

## 2020-12-03 DIAGNOSIS — Z20822 Contact with and (suspected) exposure to covid-19: Secondary | ICD-10-CM | POA: Insufficient documentation

## 2020-12-03 DIAGNOSIS — Z01812 Encounter for preprocedural laboratory examination: Secondary | ICD-10-CM | POA: Diagnosis present

## 2020-12-03 LAB — SARS CORONAVIRUS 2 (TAT 6-24 HRS): SARS Coronavirus 2: NEGATIVE

## 2020-12-04 ENCOUNTER — Ambulatory Visit
Admission: RE | Admit: 2020-12-04 | Discharge: 2020-12-04 | Disposition: A | Discharge status: Home / Self Care | Payer: Medicare Other | Source: Ambulatory Visit | Attending: Pulmonary Disease | Admitting: Pulmonary Disease

## 2020-12-04 ENCOUNTER — Ambulatory Visit
Admission: RE | Admit: 2020-12-04 | Discharge: 2020-12-04 | Disposition: A | Discharge status: Home / Self Care | Payer: Medicare Other | Source: Ambulatory Visit | Attending: Interventional Radiology | Admitting: Interventional Radiology

## 2020-12-04 ENCOUNTER — Ambulatory Visit: Admission: RE | Admit: 2020-12-04 | Payer: Medicare Other | Source: Ambulatory Visit

## 2020-12-04 ENCOUNTER — Encounter: Payer: Self-pay | Admitting: *Deleted

## 2020-12-04 ENCOUNTER — Other Ambulatory Visit: Payer: Self-pay

## 2020-12-04 DIAGNOSIS — Z881 Allergy status to other antibiotic agents status: Secondary | ICD-10-CM | POA: Diagnosis not present

## 2020-12-04 DIAGNOSIS — E7800 Pure hypercholesterolemia, unspecified: Secondary | ICD-10-CM | POA: Diagnosis not present

## 2020-12-04 DIAGNOSIS — C3432 Malignant neoplasm of lower lobe, left bronchus or lung: Secondary | ICD-10-CM | POA: Diagnosis not present

## 2020-12-04 DIAGNOSIS — J439 Emphysema, unspecified: Secondary | ICD-10-CM | POA: Insufficient documentation

## 2020-12-04 DIAGNOSIS — R911 Solitary pulmonary nodule: Secondary | ICD-10-CM

## 2020-12-04 DIAGNOSIS — C7A8 Other malignant neuroendocrine tumors: Secondary | ICD-10-CM | POA: Diagnosis not present

## 2020-12-04 DIAGNOSIS — Z8719 Personal history of other diseases of the digestive system: Secondary | ICD-10-CM | POA: Diagnosis not present

## 2020-12-04 DIAGNOSIS — Z885 Allergy status to narcotic agent status: Secondary | ICD-10-CM | POA: Insufficient documentation

## 2020-12-04 DIAGNOSIS — Z90711 Acquired absence of uterus with remaining cervical stump: Secondary | ICD-10-CM | POA: Insufficient documentation

## 2020-12-04 DIAGNOSIS — Z87891 Personal history of nicotine dependence: Secondary | ICD-10-CM | POA: Diagnosis not present

## 2020-12-04 DIAGNOSIS — E039 Hypothyroidism, unspecified: Secondary | ICD-10-CM | POA: Insufficient documentation

## 2020-12-04 DIAGNOSIS — I10 Essential (primary) hypertension: Secondary | ICD-10-CM | POA: Insufficient documentation

## 2020-12-04 DIAGNOSIS — Z9889 Other specified postprocedural states: Secondary | ICD-10-CM

## 2020-12-04 DIAGNOSIS — Z79899 Other long term (current) drug therapy: Secondary | ICD-10-CM | POA: Insufficient documentation

## 2020-12-04 LAB — CBC
HCT: 37.2 % (ref 36.0–46.0)
Hemoglobin: 12.9 g/dL (ref 12.0–15.0)
MCH: 32.1 pg (ref 26.0–34.0)
MCHC: 34.7 g/dL (ref 30.0–36.0)
MCV: 92.5 fL (ref 80.0–100.0)
Platelets: 222 10*3/uL (ref 150–400)
RBC: 4.02 MIL/uL (ref 3.87–5.11)
RDW: 13 % (ref 11.5–15.5)
WBC: 9 10*3/uL (ref 4.0–10.5)
nRBC: 0 % (ref 0.0–0.2)

## 2020-12-04 LAB — PROTIME-INR
INR: 1 (ref 0.8–1.2)
Prothrombin Time: 13.4 seconds (ref 11.4–15.2)

## 2020-12-04 MED ORDER — FENTANYL CITRATE (PF) 100 MCG/2ML IJ SOLN
INTRAMUSCULAR | Status: AC | PRN
  Administered 2020-12-04: 50 ug via INTRAVENOUS

## 2020-12-04 MED ORDER — FENTANYL CITRATE (PF) 100 MCG/2ML IJ SOLN
INTRAMUSCULAR | Status: AC
  Filled 2020-12-04: qty 2

## 2020-12-04 MED ORDER — MIDAZOLAM HCL 2 MG/2ML IJ SOLN
INTRAMUSCULAR | Status: AC | PRN
  Administered 2020-12-04: 1 mg via INTRAVENOUS

## 2020-12-04 MED ORDER — MIDAZOLAM HCL 2 MG/2ML IJ SOLN
INTRAMUSCULAR | Status: AC
  Filled 2020-12-04: qty 2

## 2020-12-04 MED ORDER — SODIUM CHLORIDE 0.9 % IV SOLN: INTRAVENOUS | Status: DC

## 2020-12-04 NOTE — Progress Notes (Signed)
Patient clinically stable post Lung biopsy per DR Mir, tolerated well with only minimal bleeding. Vitals remained stable pre and post procedure. Sinus rhythm per monitor. RA sats 97%, and rr 20. Awake/alert and oriented post procedure. Daughter to bedside post  Procedure. Will be NPO until 1330, CXR then reevaluate prior to eating per Dr Dwaine Gale. Report given to [facility] in specials post procedure.

## 2020-12-04 NOTE — Consult Note (Signed)
Chief Complaint: Left lung mass  Referring Physician(s): Aleskerov,Fuad  Patient Status: [facility] - Out-pt  History of Present Illness: Beth Mcmillan is a 75 y.o. female with a history of dyslipidemia, anxiety, hypertension, lifelong smoking, COPD presents to interventional radiology for biopsy of left lower lobe pulmonary mass.  She feels well today and denies cough, SOB, chest pain, abdominal pain.  Past Medical History:  Diagnosis Date  . Anxiety   . COPD (chronic obstructive pulmonary disease) (HCC)    EMPHYSEMA  . Dysrhythmia    tachycardia  . Hemorrhoid   . History of hiatal hernia    SMALL- 2022  . Hypercholesteremia   . Hypertension   . Hypothyroidism   . Larynx polyp   . Palpitations    with anxiety  . Platelets decreased ([place]) 2020  . Tinnitus   . Vertigo    no episodes in several years  . Wears dentures    partial lower  . White coat syndrome with hypertension     Past Surgical History:  Procedure Laterality Date  . ABDOMINAL HYSTERECTOMY     partial  . COLONOSCOPY WITH PROPOFOL N/A 01/22/2016   Procedure: COLONOSCOPY WITH PROPOFOL;  Surgeon: Lucilla Lame, MD;  Location: [place];  Service: Endoscopy;  Laterality: N/A;  . COLONOSCOPY WITH PROPOFOL N/A 04/12/2019   Procedure: COLONOSCOPY WITH BIOPSY;  Surgeon: Lucilla Lame, MD;  Location: [place];  Service: Endoscopy;  Laterality: N/A;  pt would like an early appt  . POLYPECTOMY  01/22/2016   Procedure: POLYPECTOMY;  Surgeon: Lucilla Lame, MD;  Location: [place];  Service: Endoscopy;;  . POLYPECTOMY N/A 04/12/2019   Procedure: POLYPECTOMY;  Surgeon: Lucilla Lame, MD;  Location: [place];  Service: Endoscopy;  Laterality: N/A;  . VIDEO BRONCHOSCOPY WITH ENDOBRONCHIAL NAVIGATION N/A 11/21/2020   Procedure: VIDEO BRONCHOSCOPY WITH ENDOBRONCHIAL NAVIGATION;  Surgeon: Ottie Glazier, MD;  Location: [facility];  Service: Thoracic;  Laterality: N/A;  . VIDEO BRONCHOSCOPY  WITH ENDOBRONCHIAL ULTRASOUND N/A 11/21/2020   Procedure: VIDEO BRONCHOSCOPY WITH ENDOBRONCHIAL ULTRASOUND;  Surgeon: Ottie Glazier, MD;  Location: [facility];  Service: Thoracic;  Laterality: N/A;    Allergies: Augmentin [amoxicillin-pot clavulanate], Codeine, Levaquin [levofloxacin], and Tape  Medications: Prior to Admission medications   Medication Sig Start Date End Date Taking? Authorizing Provider  ALPRAZolam Duanne Moron) 0.5 MG tablet Take 0.5 mg by mouth 2 (two) times daily as needed for anxiety (before procedures).   Yes [provider]  amLODipine (NORVASC) 5 MG tablet Take 5 mg by mouth at bedtime.   Yes [provider]  Ascorbic Acid (VITAMIN C) 500 MG CHEW Chew 500 mg by mouth daily.   Yes [provider]  atorvastatin (LIPITOR) 10 MG tablet TAKE 1 TABLET BY MOUTH DAILY Patient taking differently: Take 10 mg by mouth at bedtime. 01/28/20  Yes Masoud, Viann Shove, MD  cholecalciferol (VITAMIN D3) 25 MCG (1000 UNIT) tablet Take 1,000 Units by mouth daily.   Yes [provider]  Fluticasone-Umeclidin-Vilant (TRELEGY ELLIPTA) 100-62.5-25 MCG/INH AEPB Inhale 1 puff into the lungs every morning.   Yes [provider]  Homeopathic Products (FRANKINCENSE UPLIFTING) OIL Take 2 drops by mouth at bedtime.   Yes [provider]  levothyroxine (SYNTHROID, LEVOTHROID) 50 MCG tablet Take 50 mcg by mouth daily before breakfast.   Yes [provider]  loratadine (CLARITIN) 10 MG tablet Take 10 mg by mouth daily. Reported on 01/22/2016   Yes [provider]  losartan-hydrochlorothiazide (HYZAAR) 100-25 MG tablet  Take 1 tablet by mouth daily.   Yes [provider]  metoprolol succinate (TOPROL-XL) 50 MG 24 hr tablet Take 50 mg by mouth daily. 07/01/14  Yes [provider]  PARoxetine (PAXIL) 10 MG tablet Take 5 mg by mouth every evening.   Yes [provider]  vitamin E 180 MG (400 UNITS) capsule Take 400 Units by mouth  daily.   Yes [provider]     Family History  Problem Relation Age of Onset  . Breast cancer Neg Hx     Social History   Socioeconomic History  . Marital status: Married    Spouse name: Not on file  . Number of children: Not on file  . Years of education: Not on file  . Highest education level: Not on file  Occupational History  . Not on file  Tobacco Use  . Smoking status: Former Smoker    Packs/day: 0.50    Years: 50.00    Pack years: 25.00    Types: Cigarettes    Quit date: 11/03/2020    Years since quitting: 0.0  . Smokeless tobacco: Never Used  Vaping Use  . Vaping Use: Some days  Substance and Sexual Activity  . Alcohol use: No  . Drug use: Never  . Sexual activity: Not on file  Other Topics Concern  . Not on file  Social History Narrative  . Not on file   Social Determinants of Health   Financial Resource Strain: Not on file  Food Insecurity: Not on file  Transportation Needs: Not on file  Physical Activity: Not on file  Stress: Not on file  Social Connections: Not on file    Review of Systems: A 12 point ROS discussed and pertinent positives are indicated in the HPI above.  All other systems are negative.  Review of Systems  Vital Signs: BP 139/64   Pulse (!) 59   Temp 98 F (36.7 C) (Oral)   Ht 5\' 7"  (1.702 m)   Wt 65.3 kg   SpO2 100%   BMI 22.55 kg/m   Physical Exam Constitutional:      Appearance: Normal appearance.  HENT:     Mouth/Throat:     Mouth: Mucous membranes are moist.     Pharynx: Oropharynx is clear.  Cardiovascular:     Rate and Rhythm: Normal rate and regular rhythm.     Heart sounds: Normal heart sounds.  Pulmonary:     Effort: Pulmonary effort is normal.     Breath sounds: Normal breath sounds.  Abdominal:     General: Bowel sounds are normal.     Palpations: Abdomen is soft.  Skin:    General: Skin is warm and dry.  Neurological:     Mental Status: She is alert and oriented to person, place, and  time.  Psychiatric:        Mood and Affect: Mood normal.     Imaging: CT CHEST WO CONTRAST  Result Date: 11/19/2020 CLINICAL DATA:  Left lower lobe lung nodule. EXAM: CT CHEST WITHOUT CONTRAST TECHNIQUE: Multidetector CT imaging of the chest was performed following the standard protocol without IV contrast. COMPARISON:  Chest CT 15 days ago 11/04/2020 FINDINGS: Cardiovascular: Aortic atherosclerosis. No aortic aneurysm. Heart is normal in size. There are coronary artery calcifications. No pericardial effusion. Mediastinum/Nodes: Hilar adenopathy is not accurately assessed on this noncontrast exam. There are scattered small mediastinal lymph nodes are not enlarged by size criteria, and stable. Tiny hiatal hernia. No thyroid  nodule. Lungs/Pleura: Lobulated left lower lobe pulmonary nodule measures 2.7 x 2.6 x 2.3 cm, not significantly changed allowing for differences in caliper placement. Margins are microlobulated. Nodule is slightly central low-density. No internal air. Minimal adjacent linear opacities likely atelectasis. Additional scattered subsegmental linear atelectasis or scarring in the right middle lobe and lingula. No other pulmonary nodule. Mild emphysema. Mild biapical pleuroparenchymal scarring. Improved retained secretions in the trachea. No pleural fluid. Upper Abdomen: Minimal adrenal thickening without focal nodule. No acute upper abdominal findings. Musculoskeletal: No focal bone lesion or acute osseous abnormality. No chest wall soft tissue abnormality. IMPRESSION: 1. Left lower lobe pulmonary nodule measuring 2.7 cm, not significantly changed in size over the past 15 days. This is suspicious for primary bronchogenic malignancy. Recommend further evaluation with PET/CT or tissue sampling. 2. No mediastinal adenopathy. Hilar adenopathy is not accurately assessed on this noncontrast exam. No hilar adenopathy is seen on recent contrast-enhanced exam. 3. Emphysema. 4. Coronary artery  calcifications. Aortic Atherosclerosis (ICD10-I70.0) and Emphysema (ICD10-J43.9). Electronically Signed   By: Keith Rake M.D.   On: 11/19/2020 20:19   CT CHEST W CONTRAST  Result Date: 11/05/2020 CLINICAL DATA:  Abnormal chest x-ray EXAM: CT CHEST WITH CONTRAST TECHNIQUE: Multidetector CT imaging of the chest was performed during intravenous contrast administration. CONTRAST:  44mL OMNIPAQUE IOHEXOL 300 MG/ML  SOLN COMPARISON:  None. FINDINGS: Cardiovascular: Normal heart size. No pericardial effusion. Coronary artery calcification. Thoracic aorta is normal in caliber with moderate calcified plaque. Mediastinum/Nodes: Central airways are patent. Focal layering density in the trachea likely reflects retained secretions. No enlarged lymph nodes. Lungs/Pleura: There is a 2.6 x 2.6 x 2.6 cm mass of the left lower lobe. Linear scarring/atelectasis at the lung bases and right middle lobe and lingula. Mild emphysema. Upper Abdomen: Small hiatal hernia.  No acute abnormality. Musculoskeletal: No focal sclerotic or destructive osseous lesion. IMPRESSION: 2.6 cm left lower lobe mass. Biopsy recommended. No enlarged nodes. Small hiatal hernia. Aortic Atherosclerosis (ICD10-I70.0) and Emphysema (ICD10-J43.9). These results will be called to the ordering clinician or representative by the Radiologist Assistant, and communication documented in the PACS or [employer]. Electronically Signed   By: Macy Mis M.D.   On: 11/05/2020 14:43   NM PET Image Initial (PI) Skull Base To Thigh  Result Date: 11/28/2020 CLINICAL DATA:  Initial treatment strategy for LEFT lower lobe nodule status post bronchoscopic biopsy in a 75 year old female. EXAM: NUCLEAR MEDICINE PET SKULL BASE TO THIGH TECHNIQUE: 7.41 mCi F-18 FDG was injected intravenously. Full-ring PET imaging was performed from the skull base to thigh after the radiotracer. CT data was obtained and used for attenuation correction and anatomic localization.  Fasting blood glucose: 85 mg/dl COMPARISON:  Prior chest CT dated November 19, 2020 FINDINGS: Mediastinal blood pool activity: SUV max 2.37 Liver activity: SUV max NA NECK: No hypermetabolic lymph nodes in the neck. Incidental CT findings: none CHEST: LEFT lung mass in the LEFT lower lobe measuring 3.0 x 2.0 cm on image 109 of series 3 with a maximum SUV of 9.7. Interval development of either bronchial dilation or cystic change since the prior study with small amount of surrounding stranding no mediastinal adenopathy. Basilar atelectasis. Incidental CT findings: Densely calcified atheromatous plaque in the thoracic aorta without aneurysmal dilation. Normal caliber central pulmonary vasculature. Normal heart size without substantial pericardial effusion. Signs of coronary artery calcification as before. Esophagus mildly patulous. ABDOMEN/PELVIS: No abnormal hypermetabolic activity within the liver, pancreas, adrenal glands, or spleen. No hypermetabolic lymph nodes in the  abdomen or pelvis. Incidental CT findings: Liver, gallbladder, spleen, pancreas, adrenal glands and kidneys without acute process. Stomach and small bowel unremarkable on noncontrast CT. Normal appendix. Stool fills much of the colon. Aortic atherosclerosis without aneurysm. Post hysterectomy. SKELETON: RIGHT iliac with hypermetabolic focus on image 094 of series 3 with a maximum SUV of 11.1. Only subtle variable sclerosis without definable lesion in this location. Area measures approximately 2.5 cm greatest dimension no additional hypermetabolic focus in the visualized skeleton. Incidental CT findings: none IMPRESSION: LEFT lower lobe mass slightly larger than on recent imaging, potentially related to post biopsy changes or technical factors, with hypermetabolic features, suspicious for bronchogenic neoplasm. Small cystic focus adjacent to this mass increased bronchial dilation or post biopsy changes. Superimposed inflammation is also considered.  Correlate with any respiratory symptoms. RIGHT iliac lesion suspicious for metastasis with marked hypermetabolic activity. No additional signs of increased metabolic activity to suggest additional sites of disease. Aortic atherosclerosis. Calcified coronary artery disease. Aortic Atherosclerosis (ICD10-I70.0). Electronically Signed   By: Zetta Bills M.D.   On: 11/28/2020 17:08   DG C-Arm 1-60 Min-No Report  Result Date: 11/21/2020 Fluoroscopy was utilized by the requesting physician.  No radiographic interpretation.    Labs:  CBC: Recent Labs    11/20/20 1249 12/04/20 1029  WBC 9.1 9.0  HGB 13.7 12.9  HCT 39.7 37.2  PLT 242 222    COAGS: Recent Labs    11/20/20 1249 12/04/20 1029  INR 1.0 1.0  APTT 35  --     BMP: No results for input(s): NA, K, CL, CO2, GLUCOSE, BUN, CALCIUM, CREATININE, GFRNONAA, GFRAA in the last 8760 hours.  Invalid input(s): CMP  LIVER FUNCTION TESTS: No results for input(s): BILITOT, AST, ALT, ALKPHOS, PROT, ALBUMIN in the last 8760 hours.  TUMOR MARKERS: No results for input(s): AFPTM, CEA, CA199, CHROMGRNA in the last 8760 hours.  Assessment and Plan:  76 year old woman with history of COPD and LLL lung mass presents to IR for CT guided biopsy.    Risks and benefits of CT guided lung nodule biopsy was discussed with the patient including, but not limited to bleeding, hemoptysis, respiratory failure requiring intubation, infection, pneumothorax requiring chest tube placement, stroke from air embolism or even death.  We also discussed that she may have to spend one more nights in the hospital if she does need a chest tube for treatment of pneumothorax.  All of the patient's questions were answered and the patient is agreeable to proceed.  Consent signed and in chart.  Thank you for this interesting consult.  I greatly enjoyed meeting Beth Mcmillan and look forward to participating in their care.  A copy of this report was sent to the  requesting provider on this date.  Electronically Signed: Paula Libra Ruford Dudzinski, MD 12/04/2020, 11:25 AM   I spent a total of  30 Minutes   in face to face in clinical consultation, greater than 50% of which was counseling/coordinating care for biopsy of LLL lung mass.

## 2020-12-04 NOTE — OR Nursing (Signed)
MD made aware of BP. Order to increase fluid to 150mL/hr.

## 2020-12-04 NOTE — Discharge Instructions (Signed)
Moderate Conscious Sedation, Adult, Care After This sheet gives you information about how to care for yourself after your procedure. Your health care provider may also give you more specific instructions. If you have problems or questions, contact your health care provider. What can I expect after the procedure? After the procedure, it is common to have:  Sleepiness for several hours.  Impaired judgment for several hours.  Difficulty with balance.  Vomiting if you eat too soon. Follow these instructions at home: For the time period you were told by your health care provider:  Rest.  Do not participate in activities where you could fall or become injured.  Do not drive or use machinery.  Do not drink alcohol.  Do not take sleeping pills or medicines that cause drowsiness.  Do not make important decisions or sign legal documents.  Do not take care of children on your own.      Eating and drinking  Follow the diet recommended by your health care provider.  Drink enough fluid to keep your urine pale yellow.  If you vomit: ? Drink water, juice, or soup when you can drink without vomiting. ? Make sure you have little or no nausea before eating solid foods.   General instructions  Take over-the-counter and prescription medicines only as told by your health care provider.  Have a responsible adult stay with you for the time you are told. It is important to have someone help care for you until you are awake and alert.  Do not smoke.  Keep all follow-up visits as told by your health care provider. This is important. Contact a health care provider if:  You are still sleepy or having trouble with balance after 24 hours.  You feel light-headed.  You keep feeling nauseous or you keep vomiting.  You develop a rash.  You have a fever.  You have redness or swelling around the IV site. Get help right away if:  You have trouble breathing.  You have new-onset confusion at  home. Summary  After the procedure, it is common to feel sleepy, have impaired judgment, or feel nauseous if you eat too soon.  Rest after you get home. Know the things you should not do after the procedure.  Follow the diet recommended by your health care provider and drink enough fluid to keep your urine pale yellow.  Get help right away if you have trouble breathing or new-onset confusion at home. This information is not intended to replace advice given to you by your health care provider. Make sure you discuss any questions you have with your health care provider. Document Revised: 12/07/2019 Document Reviewed: 07/05/2019 Elsevier Patient Education  2021 [employer]. Lung Biopsy, Care After This information will help you take care of yourself after your procedure. Your health care provider may also give you more specific instructions depending on the type of biopsy you had. If you have problems or questions, contact your health care provider. What can I expect after the procedure? After the procedure, it is common to have:  A cough.  A sore throat.  Pain where a needle or incision was used to collect a biopsy sample (biopsy site). Follow these instructions at home: Medicines  Take over-the-counter and prescription medicines only as told by your health care provider.  Talk to your health care provider before you take any medicines that contain aspirin or NSAIDS, such as ibuprofen. These medicines can increase your risk of bleeding.  Ask your health care  provider if the medicine prescribed to you: ? Requires you to avoid driving or using machinery. ? Can cause constipation. You may need to take these actions to prevent or treat constipation:  Drink enough fluid to keep your urine pale yellow.  Take over-the-counter or prescription medicines.  Eat foods that are high in fiber, such as beans, whole grains, fresh fruits and vegetables.  Limit foods that are high in fat and  processed sugars, such as fried or sweet foods. Biopsy site care  If you had a needle or open biopsy, follow instructions from your health care provider about how to take care of your biopsy site. Make sure you: ? Wash your hands with soap and water for at least 20 seconds before and after you change your bandage (dressing). If soap and water are not available, use hand sanitizer. ? Change your dressing as told by your health care provider. ? Leave stitches (sutures), skin glue, or adhesive strips in place for as long as you are told. If adhesive strip edges start to loosen and curl up, you may trim the loose edges. Do not remove adhesive strips completely unless your health care provider tells you to do that.  Do not take baths, swim, or use a hot tub until your health care provider approves. Ask your health care provider if you may take showers. You may only be allowed to take sponge baths.  Check your biopsy site every day for signs of infection. Check for: ? Redness, swelling, or more pain. ? Fluid or blood. ? Warmth. ? Pus or a bad smell.   General instructions  Do not drink alcohol if your health care provider tells you not to drink.  If you were given a sedative during the procedure, it can affect you for several hours. Do not drive or operate machinery until your health care provider says that it is safe.  Return to your normal activities as told by your health care provider. Ask your health care provider what activities are safe for you.  It is up to you to get the results of your procedure. Ask your health care provider, or the department that is doing the procedure, when your results will be ready.  Keep all follow-up visits as told by your health care provider. This is important. Contact a health care provider if:  You have a fever.  You have redness, swelling, or more pain around your biopsy site.  You have fluid or blood coming from your biopsy site.  Your biopsy site  feels warm to the touch.  You have pus or a bad smell coming from your biopsy site.  You have pain that does not get better with medicine. Get help right away if:  You cough up blood.  You have trouble breathing.  You have chest pain.  You lose consciousness. These symptoms may represent a serious problem that is an emergency. Do not wait to see if the symptoms will go away. Get medical help right away. Call your local emergency services (911 in the [place] Do not drive yourself to the hospital. Summary  It is common to have some pain where a needle or incision was used to collect a biopsy sample (biopsy site).  Return to your normal activities as told by your health care provider. Ask your health care provider what activities are safe for you.  Take over-the-counter and prescription medicines only as told by your health care provider.  Report any unusual symptoms to your health  care provider. This information is not intended to replace advice given to you by your health care provider. Make sure you discuss any questions you have with your health care provider. Document Revised: 07/21/2019 Document Reviewed: 07/21/2019 Elsevier Patient Education  2021 [employer].

## 2020-12-04 NOTE — Progress Notes (Signed)
  Oncology Nurse Navigator Documentation  Navigator Location: [facility] (12/04/20 1500) Referral Date to RadOnc/MedOnc: 12/04/20 (12/04/20 1500) )Navigator Encounter Type: Telephone (12/04/20 1500) Telephone: Appt Confirmation/Clarification;Outgoing Call (12/04/20 1500) Abnormal Finding Date: 11/05/20 (12/04/20 1500)                   Treatment Phase: Abnormal Scans (12/04/20 1500) Barriers/Navigation Needs: Coordination of Care (12/04/20 1500)   Interventions: Coordination of Care (12/04/20 1500)   Coordination of Care: Appts (12/04/20 1500)       phone call made to pt's daughter to review upcoming new pt appts. All appts reviewed with pt's daughter. Contact info given and instructed to call with any questions or needs. Pt's daughter verbalized understanding. Nothing further needed at this time.           Time Spent with Patient: 30 (12/04/20 1500)

## 2020-12-04 NOTE — Procedures (Signed)
Interventional Radiology Procedure Note  Procedure: Lung mass biopsy  Indication: LLL lung mass  Findings: Please refer to procedural dictation for full description.  Complications: None  EBL: < 10 mL  Miachel Roux, MD [phone]

## 2020-12-08 ENCOUNTER — Other Ambulatory Visit: Payer: Self-pay | Admitting: Anatomic Pathology & Clinical Pathology

## 2020-12-08 LAB — SURGICAL PATHOLOGY

## 2020-12-08 NOTE — Progress Notes (Signed)
dt ?

## 2020-12-11 ENCOUNTER — Other Ambulatory Visit: Payer: Medicare Other

## 2020-12-11 ENCOUNTER — Other Ambulatory Visit: Payer: Self-pay | Admitting: *Deleted

## 2020-12-11 DIAGNOSIS — C3490 Malignant neoplasm of unspecified part of unspecified bronchus or lung: Secondary | ICD-10-CM

## 2020-12-11 DIAGNOSIS — R948 Abnormal results of function studies of other organs and systems: Secondary | ICD-10-CM

## 2020-12-11 NOTE — Progress Notes (Signed)
Tumor Board Documentation  Beth Mcmillan was presented by Dr Janese Banks at our Tumor Board on 12/11/2020, which included representatives from medical oncology,radiation oncology,internal medicine,navigation,pathology,radiology,surgical,pharmacy,genetics,research,pulmonology.  Beth Mcmillan currently presents as a new patient,for [facility],for new positive pathology with history of the following treatments: surgical intervention(s).  Additionally, we reviewed previous medical and familial history, history of present illness, and recent lab results along with all available histopathologic and imaging studies. The tumor board considered available treatment options and made the following recommendations: Additional screening,Biopsy (Brain scan) Surgery vs Chemo radiation  depending on biopsy results  The following procedures/referrals were also placed: No orders of the defined types were placed in this encounter.   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  Tumor board is a meeting of clinicians from various specialty areas who evaluate and discuss patients for whom a multidisciplinary approach is being considered. Final determinations in the plan of care are those of the provider(s). The responsibility for follow up of recommendations given during tumor board is that of the provider.   Today's extended care, comprehensive team conference, Beth Mcmillan was not present for the discussion and was not examined.   Multidisciplinary Tumor Board is a multidisciplinary case peer review process.  Decisions discussed in the Multidisciplinary Tumor Board reflect the opinions of the specialists present at the conference without having examined the patient.  Ultimately, treatment and diagnostic decisions rest with the primary provider(s) and the  patient.

## 2020-12-12 ENCOUNTER — Inpatient Hospital Stay: Payer: Medicare Other | Attending: Oncology | Admitting: Oncology

## 2020-12-12 ENCOUNTER — Ambulatory Visit: Payer: Medicare Other | Admitting: Radiation Oncology

## 2020-12-12 ENCOUNTER — Inpatient Hospital Stay: Payer: Medicare Other

## 2020-12-12 ENCOUNTER — Other Ambulatory Visit: Payer: Self-pay

## 2020-12-12 ENCOUNTER — Other Ambulatory Visit: Payer: Self-pay | Admitting: *Deleted

## 2020-12-12 ENCOUNTER — Encounter: Payer: Self-pay | Admitting: Oncology

## 2020-12-12 ENCOUNTER — Telehealth: Payer: Self-pay | Admitting: *Deleted

## 2020-12-12 DIAGNOSIS — C3490 Malignant neoplasm of unspecified part of unspecified bronchus or lung: Secondary | ICD-10-CM

## 2020-12-12 DIAGNOSIS — D120 Benign neoplasm of cecum: Secondary | ICD-10-CM | POA: Insufficient documentation

## 2020-12-12 DIAGNOSIS — J479 Bronchiectasis, uncomplicated: Secondary | ICD-10-CM | POA: Insufficient documentation

## 2020-12-12 DIAGNOSIS — Z87891 Personal history of nicotine dependence: Secondary | ICD-10-CM | POA: Insufficient documentation

## 2020-12-12 DIAGNOSIS — R5383 Other fatigue: Secondary | ICD-10-CM | POA: Diagnosis not present

## 2020-12-12 DIAGNOSIS — R0989 Other specified symptoms and signs involving the circulatory and respiratory systems: Secondary | ICD-10-CM | POA: Diagnosis not present

## 2020-12-12 DIAGNOSIS — Z881 Allergy status to other antibiotic agents status: Secondary | ICD-10-CM | POA: Diagnosis not present

## 2020-12-12 DIAGNOSIS — Z79899 Other long term (current) drug therapy: Secondary | ICD-10-CM | POA: Insufficient documentation

## 2020-12-12 DIAGNOSIS — Z7189 Other specified counseling: Secondary | ICD-10-CM

## 2020-12-12 DIAGNOSIS — Z8719 Personal history of other diseases of the digestive system: Secondary | ICD-10-CM | POA: Diagnosis not present

## 2020-12-12 DIAGNOSIS — Z880 Allergy status to penicillin: Secondary | ICD-10-CM | POA: Diagnosis not present

## 2020-12-12 DIAGNOSIS — C3432 Malignant neoplasm of lower lobe, left bronchus or lung: Secondary | ICD-10-CM | POA: Insufficient documentation

## 2020-12-12 DIAGNOSIS — J439 Emphysema, unspecified: Secondary | ICD-10-CM | POA: Insufficient documentation

## 2020-12-12 DIAGNOSIS — Z885 Allergy status to narcotic agent status: Secondary | ICD-10-CM | POA: Diagnosis not present

## 2020-12-12 DIAGNOSIS — I700 Atherosclerosis of aorta: Secondary | ICD-10-CM | POA: Insufficient documentation

## 2020-12-12 DIAGNOSIS — Z888 Allergy status to other drugs, medicaments and biological substances status: Secondary | ICD-10-CM | POA: Diagnosis not present

## 2020-12-12 DIAGNOSIS — I2510 Atherosclerotic heart disease of native coronary artery without angina pectoris: Secondary | ICD-10-CM | POA: Insufficient documentation

## 2020-12-12 DIAGNOSIS — D122 Benign neoplasm of ascending colon: Secondary | ICD-10-CM | POA: Diagnosis not present

## 2020-12-12 DIAGNOSIS — K449 Diaphragmatic hernia without obstruction or gangrene: Secondary | ICD-10-CM | POA: Insufficient documentation

## 2020-12-12 NOTE — Patient Instructions (Signed)
Atezolizumab injection What is this medicine? ATEZOLIZUMAB (a te zoe LIZ ue mab) is a monoclonal antibody. It is used to treat bladder cancer (urothelial cancer), liver cancer, lung cancer, and melanoma. This medicine may be used for other purposes; ask your health care provider or pharmacist if you have questions. COMMON BRAND NAME(S): Tecentriq What should I tell my health care provider before I take this medicine? They need to know if you have any of these conditions:  autoimmune diseases like Crohn's disease, ulcerative colitis, or lupus  have had or planning to have an allogeneic stem cell transplant (uses someone else's stem cells)  history of organ transplant  history of radiation to the chest  nervous system problems like myasthenia gravis or Guillain-Barre syndrome  an unusual or allergic reaction to atezolizumab, other medicines, foods, dyes, or preservatives  pregnant or trying to get pregnant  breast-feeding How should I use this medicine? This medicine is for infusion into a vein. It is given by a health care professional in a hospital or clinic setting. A special MedGuide will be given to you before each treatment. Be sure to read this information carefully each time. Talk to your pediatrician regarding the use of this medicine in children. Special care may be needed. Overdosage: If you think you have taken too much of this medicine contact a poison control center or emergency room at once. NOTE: This medicine is only for you. Do not share this medicine with others. What if I miss a dose? It is important not to miss your dose. Call your doctor or health care professional if you are unable to keep an appointment. What may interact with this medicine? Interactions have not been studied. This list may not describe all possible interactions. Give your health care provider a list of all the medicines, herbs, non-prescription drugs, or dietary supplements you use. Also tell  them if you smoke, drink alcohol, or use illegal drugs. Some items may interact with your medicine. What should I watch for while using this medicine? Your condition will be monitored carefully while you are receiving this medicine. You may need blood work done while you are taking this medicine. Do not become pregnant while taking this medicine or for at least 5 months after stopping it. Women should inform their doctor if they wish to become pregnant or think they might be pregnant. There is a potential for serious side effects to an unborn child. Talk to your health care professional or pharmacist for more information. Do not breast-feed an infant while taking this medicine or for at least 5 months after the last dose. What side effects may I notice from receiving this medicine? Side effects that you should report to your doctor or health care professional as soon as possible:  allergic reactions like skin rash, itching or hives, swelling of the face, lips, or tongue  black, tarry stools  bloody or watery diarrhea  breathing problems  changes in vision  chest pain or chest tightness  chills  facial flushing  fever  headache  signs and symptoms of high blood sugar such as dizziness; dry mouth; dry skin; fruity breath; nausea; stomach pain; increased hunger or thirst; increased urination  signs and symptoms of liver injury like dark yellow or brown urine; general ill feeling or flu-like symptoms; light-colored stools; loss of appetite; nausea; right upper belly pain; unusually weak or tired; yellowing of the eyes or skin  stomach pain  trouble passing urine or change in the amount of   urine Side effects that usually do not require medical attention (report to your doctor or health care professional if they continue or are bothersome):  bone pain  cough  diarrhea  joint pain  muscle pain  muscle weakness  swelling of arms or legs  tiredness  weight loss This list  may not describe all possible side effects. Call your doctor for medical advice about side effects. You may report side effects to FDA at 1-800-FDA-1088. Where should I keep my medicine? This drug is given in a hospital or clinic and will not be stored at home. NOTE: This sheet is a summary. It may not cover all possible information. If you have questions about this medicine, talk to your doctor, pharmacist, or health care provider.  2021 [employer]/Gold Standard (2020-05-08 13:59:34) Etoposide, VP-16 injection What is this medicine? ETOPOSIDE, VP-16 (e toe POE side) is a chemotherapy drug. It is used to treat testicular cancer, lung cancer, and other cancers. This medicine may be used for other purposes; ask your health care provider or pharmacist if you have questions. COMMON BRAND NAME(S): Etopophos, Toposar, VePesid What should I tell my health care provider before I take this medicine? They need to know if you have any of these conditions:  infection  kidney disease  liver disease  low blood counts, like low white cell, platelet, or red cell counts  an unusual or allergic reaction to etoposide, other medicines, foods, dyes, or preservatives  pregnant or trying to get pregnant  breast-feeding How should I use this medicine? This medicine is for infusion into a vein. It is administered in a hospital or clinic by a specially trained health care professional. Talk to your pediatrician regarding the use of this medicine in children. Special care may be needed. Overdosage: If you think you have taken too much of this medicine contact a poison control center or emergency room at once. NOTE: This medicine is only for you. Do not share this medicine with others. What if I miss a dose? It is important not to miss your dose. Call your doctor or health care professional if you are unable to keep an appointment. What may interact with this medicine? This medicine may interact with the  following medications:  warfarin This list may not describe all possible interactions. Give your health care provider a list of all the medicines, herbs, non-prescription drugs, or dietary supplements you use. Also tell them if you smoke, drink alcohol, or use illegal drugs. Some items may interact with your medicine. What should I watch for while using this medicine? Visit your doctor for checks on your progress. This drug may make you feel generally unwell. This is not uncommon, as chemotherapy can affect healthy cells as well as cancer cells. Report any side effects. Continue your course of treatment even though you feel ill unless your doctor tells you to stop. In some cases, you may be given additional medicines to help with side effects. Follow all directions for their use. Call your doctor or health care professional for advice if you get a fever, chills or sore throat, or other symptoms of a cold or flu. Do not treat yourself. This drug decreases your body's ability to fight infections. Try to avoid being around people who are sick. This medicine may increase your risk to bruise or bleed. Call your doctor or health care professional if you notice any unusual bleeding. Talk to your doctor about your risk of cancer. You may be more at risk for   certain types of cancers if you take this medicine. Do not become pregnant while taking this medicine or for at least 6 months after stopping it. Women should inform their doctor if they wish to become pregnant or think they might be pregnant. Women of child-bearing potential will need to have a negative pregnancy test before starting this medicine. There is a potential for serious side effects to an unborn child. Talk to your health care professional or pharmacist for more information. Do not breast-feed an infant while taking this medicine. Men must use a latex condom during sexual contact with a woman while taking this medicine and for at least 4 months  after stopping it. A latex condom is needed even if you have had a vasectomy. Contact your doctor right away if your partner becomes pregnant. Do not donate sperm while taking this medicine and for at least 4 months after you stop taking this medicine. Men should inform their doctors if they wish to father a child. This medicine may lower sperm counts. What side effects may I notice from receiving this medicine? Side effects that you should report to your doctor or health care professional as soon as possible:  allergic reactions like skin rash, itching or hives, swelling of the face, lips, or tongue  low blood counts - this medicine may decrease the number of white blood cells, red blood cells, and platelets. You may be at increased risk for infections and bleeding  nausea, vomiting  redness, blistering, peeling or loosening of the skin, including inside the mouth  signs and symptoms of infection like fever; chills; cough; sore throat; pain or trouble passing urine  signs and symptoms of low red blood cells or anemia such as unusually weak or tired; feeling faint or lightheaded; falls; breathing problems  unusual bruising or bleeding Side effects that usually do not require medical attention (report to your doctor or health care professional if they continue or are bothersome):  changes in taste  diarrhea  hair loss  loss of appetite  mouth sores This list may not describe all possible side effects. Call your doctor for medical advice about side effects. You may report side effects to FDA at 1-800-FDA-1088. Where should I keep my medicine? This drug is given in a hospital or clinic and will not be stored at home. NOTE: This sheet is a summary. It may not cover all possible information. If you have questions about this medicine, talk to your doctor, pharmacist, or health care provider.  2021 [employer]/Gold Standard (2018-10-04 16:57:15) Carboplatin injection What is this  medicine? CARBOPLATIN (KAR boe pla tin) is a chemotherapy drug. It targets fast dividing cells, like cancer cells, and causes these cells to die. This medicine is used to treat ovarian cancer and many other cancers. This medicine may be used for other purposes; ask your health care provider or pharmacist if you have questions. COMMON BRAND NAME(S): Paraplatin What should I tell my health care provider before I take this medicine? They need to know if you have any of these conditions:  blood disorders  hearing problems  kidney disease  recent or ongoing radiation therapy  an unusual or allergic reaction to carboplatin, cisplatin, other chemotherapy, other medicines, foods, dyes, or preservatives  pregnant or trying to get pregnant  breast-feeding How should I use this medicine? This drug is usually given as an infusion into a vein. It is administered in a hospital or clinic by a specially trained health care professional. Talk to your   pediatrician regarding the use of this medicine in children. Special care may be needed. Overdosage: If you think you have taken too much of this medicine contact a poison control center or emergency room at once. NOTE: This medicine is only for you. Do not share this medicine with others. What if I miss a dose? It is important not to miss a dose. Call your doctor or health care professional if you are unable to keep an appointment. What may interact with this medicine?  medicines for seizures  medicines to increase blood counts like filgrastim, pegfilgrastim, sargramostim  some antibiotics like amikacin, gentamicin, neomycin, streptomycin, tobramycin  vaccines Talk to your doctor or health care professional before taking any of these medicines:  acetaminophen  aspirin  ibuprofen  ketoprofen  naproxen This list may not describe all possible interactions. Give your health care provider a list of all the medicines, herbs, non-prescription  drugs, or dietary supplements you use. Also tell them if you smoke, drink alcohol, or use illegal drugs. Some items may interact with your medicine. What should I watch for while using this medicine? Your condition will be monitored carefully while you are receiving this medicine. You will need important blood work done while you are taking this medicine. This drug may make you feel generally unwell. This is not uncommon, as chemotherapy can affect healthy cells as well as cancer cells. Report any side effects. Continue your course of treatment even though you feel ill unless your doctor tells you to stop. In some cases, you may be given additional medicines to help with side effects. Follow all directions for their use. Call your doctor or health care professional for advice if you get a fever, chills or sore throat, or other symptoms of a cold or flu. Do not treat yourself. This drug decreases your body's ability to fight infections. Try to avoid being around people who are sick. This medicine may increase your risk to bruise or bleed. Call your doctor or health care professional if you notice any unusual bleeding. Be careful brushing and flossing your teeth or using a toothpick because you may get an infection or bleed more easily. If you have any dental work done, tell your dentist you are receiving this medicine. Avoid taking products that contain aspirin, acetaminophen, ibuprofen, naproxen, or ketoprofen unless instructed by your doctor. These medicines may hide a fever. Do not become pregnant while taking this medicine. Women should inform their doctor if they wish to become pregnant or think they might be pregnant. There is a potential for serious side effects to an unborn child. Talk to your health care professional or pharmacist for more information. Do not breast-feed an infant while taking this medicine. What side effects may I notice from receiving this medicine? Side effects that you should  report to your doctor or health care professional as soon as possible:  allergic reactions like skin rash, itching or hives, swelling of the face, lips, or tongue  signs of infection - fever or chills, cough, sore throat, pain or difficulty passing urine  signs of decreased platelets or bleeding - bruising, pinpoint red spots on the skin, black, tarry stools, nosebleeds  signs of decreased red blood cells - unusually weak or tired, fainting spells, lightheadedness  breathing problems  changes in hearing  changes in vision  chest pain  high blood pressure  low blood counts - This drug may decrease the number of white blood cells, red blood cells and platelets. You may be   at increased risk for infections and bleeding.  nausea and vomiting  pain, swelling, redness or irritation at the injection site  pain, tingling, numbness in the hands or feet  problems with balance, talking, walking  trouble passing urine or change in the amount of urine Side effects that usually do not require medical attention (report to your doctor or health care professional if they continue or are bothersome):  hair loss  loss of appetite  metallic taste in the mouth or changes in taste This list may not describe all possible side effects. Call your doctor for medical advice about side effects. You may report side effects to FDA at 1-800-FDA-1088. Where should I keep my medicine? This drug is given in a hospital or clinic and will not be stored at home. NOTE: This sheet is a summary. It may not cover all possible information. If you have questions about this medicine, talk to your doctor, pharmacist, or health care provider.  2021 [employer]/Gold Standard (2007-11-14 14:38:05)  

## 2020-12-12 NOTE — Progress Notes (Signed)
Pt here to go over lung cancer and make plans for how to take care of pt. Pt has white coat syndrome and nervous today . She is with her daughter

## 2020-12-13 ENCOUNTER — Encounter: Payer: Self-pay | Admitting: Oncology

## 2020-12-13 DIAGNOSIS — Z7189 Other specified counseling: Secondary | ICD-10-CM | POA: Insufficient documentation

## 2020-12-13 DIAGNOSIS — C3490 Malignant neoplasm of unspecified part of unspecified bronchus or lung: Secondary | ICD-10-CM | POA: Insufficient documentation

## 2020-12-13 NOTE — Progress Notes (Signed)
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 Lanney Gins  Date of visit: 12/13/20   History of presenting illness- Patient is a 75 year old female with extensive smoking history and currently smokes half a pack per day.  She underwent CT chest with contrast in March 2022 following an abnormal x-ray which showed a 2.6 cm mass in the left lower lobe.  This was followed by a PET CT scan which showed a 3.0 x 2.0 cm mass in the left lower lobe of the lung with an SUV of 9.7.  No evidence of locoregional adenopathy.  No evidence of intra-abdominal disease with patient was noted to have a hypermetabolic focus in the right iliac bone with an SUV of 11.1 which was suspicious for metastatic disease.  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  Cells were positive for TTF-1, CD56 and chromogranin.  Patient is here with her daughter today.  She lives with her husband who was also been recently diagnosed with prostate cancer.  She is active and independent of her ADLs.  She denies any specific complaints at this time.  She has mild baseline fatigue.  Denies any unintentional weight loss  ECOG PS- 1  Pain scale- 0   Review of systems- Review of Systems  Constitutional: Positive for malaise/fatigue. Negative for chills, fever and weight loss.  HENT: Negative for congestion, ear discharge and nosebleeds.   Eyes: Negative for blurred vision.  Respiratory: Negative for cough, hemoptysis, sputum production, shortness of breath and wheezing.    Cardiovascular: Negative for chest pain, palpitations, orthopnea and claudication.  Gastrointestinal: Negative for abdominal pain, blood in stool, constipation, diarrhea, heartburn, melena, nausea and vomiting.  Genitourinary: Negative for dysuria, flank pain, frequency, hematuria and urgency.  Musculoskeletal: Negative for back pain, joint pain and myalgias.  Skin: Negative for rash.  Neurological: Negative for dizziness, tingling, focal weakness, seizures, weakness and headaches.  Endo/Heme/Allergies: Does not bruise/bleed easily.  Psychiatric/Behavioral: Negative for depression and suicidal ideas. The patient does not have insomnia.     Allergies  Allergen Reactions  . Augmentin [Amoxicillin-Pot Clavulanate]     "messes up my blood cells"  . Codeine Nausea Only  . Levaquin [Levofloxacin]     Affected lab results   . Tape Rash    Some bandaids cause rash sometimes    Patient Active Problem List   Diagnosis Date Noted  . Goals of care, counseling/discussion 12/13/2020  . Small cell lung cancer in adult [facility]) 12/13/2020  . Personal history of colonic polyps   . Polyp of transverse colon   . Special screening for malignant neoplasms, colon   . Benign neoplasm of cecum   . Benign neoplasm of ascending colon      Past Medical History:  Diagnosis Date  . Anxiety   . Breathing problem    low breathing function 53%  . COPD (chronic obstructive pulmonary disease) (HCC)    EMPHYSEMA  . Dysrhythmia    tachycardia  . Emphysema of lung ([place])   . Hemorrhoid   . History of hiatal hernia    SMALL- 2022  . Hypercholesteremia   . Hypertension   .  Hypothyroidism   . Larynx polyp   . Palpitations    with anxiety  . Platelets decreased ([place]) 2020  . Seasonal allergies   . Tinnitus   . Vertigo    no episodes in several years  . Wears dentures    partial lower  . White coat syndrome with hypertension      Past Surgical History:  Procedure Laterality Date  . ABDOMINAL  HYSTERECTOMY     partial  . COLONOSCOPY WITH PROPOFOL N/A 01/22/2016   Procedure: COLONOSCOPY WITH PROPOFOL;  Surgeon: Lucilla Lame, MD;  Location: [place];  Service: Endoscopy;  Laterality: N/A;  . COLONOSCOPY WITH PROPOFOL N/A 04/12/2019   Procedure: COLONOSCOPY WITH BIOPSY;  Surgeon: Lucilla Lame, MD;  Location: [place];  Service: Endoscopy;  Laterality: N/A;  pt would like an early appt  . ct guided lung bx    . POLYPECTOMY  01/22/2016   Procedure: POLYPECTOMY;  Surgeon: Lucilla Lame, MD;  Location: [place];  Service: Endoscopy;;  . POLYPECTOMY N/A 04/12/2019   Procedure: POLYPECTOMY;  Surgeon: Lucilla Lame, MD;  Location: [place];  Service: Endoscopy;  Laterality: N/A;  . VIDEO BRONCHOSCOPY WITH ENDOBRONCHIAL NAVIGATION N/A 11/21/2020   Procedure: VIDEO BRONCHOSCOPY WITH ENDOBRONCHIAL NAVIGATION;  Surgeon: Ottie Glazier, MD;  Location: [facility];  Service: Thoracic;  Laterality: N/A;  . VIDEO BRONCHOSCOPY WITH ENDOBRONCHIAL ULTRASOUND N/A 11/21/2020   Procedure: VIDEO BRONCHOSCOPY WITH ENDOBRONCHIAL ULTRASOUND;  Surgeon: Ottie Glazier, MD;  Location: [facility];  Service: Thoracic;  Laterality: N/A;    Social History   Socioeconomic History  . Marital status: Married    Spouse name: Not on file  . Number of children: Not on file  . Years of education: Not on file  . Highest education level: Not on file  Occupational History  . Not on file  Tobacco Use  . Smoking status: Former Smoker    Packs/day: 0.50    Years: 50.00    Pack years: 25.00    Types: Cigarettes    Quit date: 11/03/2020    Years since quitting: 0.1  . Smokeless tobacco: Never Used  Vaping Use  . Vaping Use: Some days  Substance and Sexual Activity  . Alcohol use: No  . Drug use: Never  . Sexual activity: Not on file  Other Topics Concern  . Not on file  Social History Narrative  . Not on file   Social Determinants of Health   Financial Resource Strain: Not on file   Food Insecurity: Not on file  Transportation Needs: Not on file  Physical Activity: Not on file  Stress: Not on file  Social Connections: Not on file  Intimate Partner Violence: Not on file     Family History  Problem Relation Age of Onset  . Breast cancer Neg Hx      Current Outpatient Medications:  .  ALPRAZolam (XANAX) 0.5 MG tablet, Take 0.5 mg by mouth 2 (two) times daily as needed for anxiety (before procedures)., Disp: , Rfl:  .  amLODipine (NORVASC) 5 MG tablet, Take 5 mg by mouth at bedtime., Disp: , Rfl:  .  Ascorbic Acid (VITAMIN C) 500 MG CHEW, Chew 500 mg by mouth daily., Disp: , Rfl:  .  atorvastatin (LIPITOR) 10 MG tablet, TAKE 1 TABLET BY MOUTH DAILY (Patient taking differently: Take 10 mg by mouth at bedtime.), Disp: 90 tablet, Rfl: 0 .  cholecalciferol (VITAMIN D3) 25 MCG (1000 UNIT) tablet, Take 1,000 Units by mouth daily.,  Disp: , Rfl:  .  Homeopathic Products (FRANKINCENSE UPLIFTING) OIL, Take 2 drops by mouth at bedtime., Disp: , Rfl:  .  levothyroxine (SYNTHROID, LEVOTHROID) 50 MCG tablet, Take 50 mcg by mouth daily before breakfast., Disp: , Rfl:  .  loratadine (CLARITIN) 10 MG tablet, Take 10 mg by mouth daily. Reported on 01/22/2016, Disp: , Rfl:  .  losartan-hydrochlorothiazide (HYZAAR) 100-25 MG tablet, Take 1 tablet by mouth daily., Disp: , Rfl:  .  metoprolol succinate (TOPROL-XL) 50 MG 24 hr tablet, Take 50 mg by mouth daily., Disp: , Rfl:  .  PARoxetine (PAXIL) 10 MG tablet, Take 5 mg by mouth every evening., Disp: , Rfl:  .  vitamin E 180 MG (400 UNITS) capsule, Take 400 Units by mouth daily., Disp: , Rfl:    Physical exam:  Physical Exam Constitutional:      General: She is not in acute distress. Cardiovascular:     Rate and Rhythm: Normal rate and regular rhythm.     Heart sounds: Normal heart sounds.  Pulmonary:     Effort: Pulmonary effort is normal.     Breath sounds: Normal breath sounds.  Abdominal:     General: Bowel sounds are normal.      Palpations: Abdomen is soft.  Skin:    General: Skin is warm and dry.  Neurological:     Mental Status: She is alert and oriented to person, place, and time.        CMP Latest Ref Rng & Units 06/05/2013  Glucose 65 - 99 mg/dL 127(H)  BUN 7 - 18 mg/dL 10  Creatinine 0.60 - 1.30 mg/dL 0.94  Sodium 136 - 145 mmol/L 131(L)  Potassium 3.5 - 5.1 mmol/L 3.0(L)  Chloride 98 - 107 mmol/L 96(L)  CO2 21 - 32 mmol/L 26  Calcium 8.5 - 10.1 mg/dL 9.1   CBC Latest Ref Rng & Units 12/04/2020  WBC 4.0 - 10.5 K/uL 9.0  Hemoglobin 12.0 - 15.0 g/dL 12.9  Hematocrit 36.0 - 46.0 % 37.2  Platelets 150 - 400 K/uL 222    No images are attached to the encounter.  CT CHEST WO CONTRAST  Result Date: 11/19/2020 CLINICAL DATA:  Left lower lobe lung nodule. EXAM: CT CHEST WITHOUT CONTRAST TECHNIQUE: Multidetector CT imaging of the chest was performed following the standard protocol without IV contrast. COMPARISON:  Chest CT 15 days ago 11/04/2020 FINDINGS: Cardiovascular: Aortic atherosclerosis. No aortic aneurysm. Heart is normal in size. There are coronary artery calcifications. No pericardial effusion. Mediastinum/Nodes: Hilar adenopathy is not accurately assessed on this noncontrast exam. There are scattered small mediastinal lymph nodes are not enlarged by size criteria, and stable. Tiny hiatal hernia. No thyroid nodule. Lungs/Pleura: Lobulated left lower lobe pulmonary nodule measures 2.7 x 2.6 x 2.3 cm, not significantly changed allowing for differences in caliper placement. Margins are microlobulated. Nodule is slightly central low-density. No internal air. Minimal adjacent linear opacities likely atelectasis. Additional scattered subsegmental linear atelectasis or scarring in the right middle lobe and lingula. No other pulmonary nodule. Mild emphysema. Mild biapical pleuroparenchymal scarring. Improved retained secretions in the trachea. No pleural fluid. Upper Abdomen: Minimal adrenal thickening  without focal nodule. No acute upper abdominal findings. Musculoskeletal: No focal bone lesion or acute osseous abnormality. No chest wall soft tissue abnormality. IMPRESSION: 1. Left lower lobe pulmonary nodule measuring 2.7 cm, not significantly changed in size over the past 15 days. This is suspicious for primary bronchogenic malignancy. Recommend further evaluation with PET/CT or tissue sampling. 2.  No mediastinal adenopathy. Hilar adenopathy is not accurately assessed on this noncontrast exam. No hilar adenopathy is seen on recent contrast-enhanced exam. 3. Emphysema. 4. Coronary artery calcifications. Aortic Atherosclerosis (ICD10-I70.0) and Emphysema (ICD10-J43.9). Electronically Signed   By: Keith Rake M.D.   On: 11/19/2020 20:19   NM PET Image Initial (PI) Skull Base To Thigh  Result Date: 11/28/2020 CLINICAL DATA:  Initial treatment strategy for LEFT lower lobe nodule status post bronchoscopic biopsy in a 75 year old female. EXAM: NUCLEAR MEDICINE PET SKULL BASE TO THIGH TECHNIQUE: 7.41 mCi F-18 FDG was injected intravenously. Full-ring PET imaging was performed from the skull base to thigh after the radiotracer. CT data was obtained and used for attenuation correction and anatomic localization. Fasting blood glucose: 85 mg/dl COMPARISON:  Prior chest CT dated November 19, 2020 FINDINGS: Mediastinal blood pool activity: SUV max 2.37 Liver activity: SUV max NA NECK: No hypermetabolic lymph nodes in the neck. Incidental CT findings: none CHEST: LEFT lung mass in the LEFT lower lobe measuring 3.0 x 2.0 cm on image 109 of series 3 with a maximum SUV of 9.7. Interval development of either bronchial dilation or cystic change since the prior study with small amount of surrounding stranding no mediastinal adenopathy. Basilar atelectasis. Incidental CT findings: Densely calcified atheromatous plaque in the thoracic aorta without aneurysmal dilation. Normal caliber central pulmonary vasculature. Normal heart  size without substantial pericardial effusion. Signs of coronary artery calcification as before. Esophagus mildly patulous. ABDOMEN/PELVIS: No abnormal hypermetabolic activity within the liver, pancreas, adrenal glands, or spleen. No hypermetabolic lymph nodes in the abdomen or pelvis. Incidental CT findings: Liver, gallbladder, spleen, pancreas, adrenal glands and kidneys without acute process. Stomach and small bowel unremarkable on noncontrast CT. Normal appendix. Stool fills much of the colon. Aortic atherosclerosis without aneurysm. Post hysterectomy. SKELETON: RIGHT iliac with hypermetabolic focus on image 381 of series 3 with a maximum SUV of 11.1. Only subtle variable sclerosis without definable lesion in this location. Area measures approximately 2.5 cm greatest dimension no additional hypermetabolic focus in the visualized skeleton. Incidental CT findings: none IMPRESSION: LEFT lower lobe mass slightly larger than on recent imaging, potentially related to post biopsy changes or technical factors, with hypermetabolic features, suspicious for bronchogenic neoplasm. Small cystic focus adjacent to this mass increased bronchial dilation or post biopsy changes. Superimposed inflammation is also considered. Correlate with any respiratory symptoms. RIGHT iliac lesion suspicious for metastasis with marked hypermetabolic activity. No additional signs of increased metabolic activity to suggest additional sites of disease. Aortic atherosclerosis. Calcified coronary artery disease. Aortic Atherosclerosis (ICD10-I70.0). Electronically Signed   By: Zetta Bills M.D.   On: 11/28/2020 17:08   CT BIOPSY  Result Date: 12/04/2020 INDICATION: 75 year old woman with FDG avid left lower lobe lung mass presents to interventional radiology for CT-guided biopsy. EXAM: CT-guided biopsy of left lower lobe mass MEDICATIONS: None. ANESTHESIA/SEDATION: Moderate (conscious) sedation was employed during this procedure. A total of  Versed 1 mg and Fentanyl 50 mcg was administered intravenously. Moderate Sedation Time: 19 minutes. The patient's level of consciousness and vital signs were monitored continuously by radiology nursing throughout the procedure under my direct supervision. COMPLICATIONS: None immediate. PROCEDURE: Informed written consent was obtained from the patient after a thorough discussion of the procedural risks, benefits and alternatives. All questions were addressed. Maximal Sterile Barrier Technique was utilized including caps, mask, sterile gowns, sterile gloves, sterile drape, hand hygiene and skin antiseptic. A timeout was performed prior to the initiation of the procedure. Patient position supine on the  CT table. Left lateral chest wall skin prepped and draped in usual sterile fashion. Following local lidocaine administration, 17 gauge introducer needle was advanced into the left lower lobe lung mass utilizing CT guidance. Three cores were obtained from the left lower lobe lung mass and sent to pathology in formalin. Needle removed and hemostasis achieved with manual compression. IMPRESSION: CT-guided biopsy of left lower lobe lung mass as above. Electronically Signed   By: Miachel Roux M.D.   On: 12/04/2020 14:10   DG Chest Port 1 View  Result Date: 12/04/2020 CLINICAL DATA:  75 year old woman status post left lower lobe lung mass biopsy. EXAM: PORTABLE CHEST 1 VIEW COMPARISON:  Chest radiograph 06/05/2013 FINDINGS: Cardiomediastinal silhouette and pulmonary vasculature are within normal limits. Left lower lobe lung mass again seen. Haziness around the mass likely due to prior lesional hemorrhage. No pneumothorax identified. Atherosclerotic changes of the thoracic aorta again seen. IMPRESSION: No pneumothorax status post left lower lobe lung mass biopsy. Electronically Signed   By: Miachel Roux M.D.   On: 12/04/2020 14:12   DG C-Arm 1-60 Min-No Report  Result Date: 11/21/2020 Fluoroscopy was utilized by the  requesting physician.  No radiographic interpretation.    Assessment and plan- Patient is a 75 y.o. 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  We have discussed her case at tumor board.  I also shown the images to the patient and her daughter.  Patient noted to have a 3 cm left lower lobe lung mass which was biopsy-proven small cell lung cancer.  There was no evidence of local regional adenopathy.  However she was noted to have a hypermetabolic right iliac bone lesion which is concerning for metastatic disease.  At this time I would like to proceed with a CT-guided biopsy of the right iliac bone.  PET CT scan findings are highly concerning for metastatic disease.  I explained to the patient and daughter that even if right iliac bone biopsy comes back negative for malignancy given that this is small cell lung cancer I would not recommend upfront surgery at this time.  Patient has not had a pathological mediastinal staging and there is often upstaging noted at the time of surgery for small cell lung cancer.  Patient would require adjuvant chemotherapy after small cell lung cancer surgery anyways and if there is nodal involvement she would need radiation as well.  If right iliac bone biopsy comes back negative for malignancy I would favor concurrent chemoradiation with carbo etoposide and close monitoring of the right iliac bone lesion with palliative radiation to that area as well.  If right iliac bone biopsy comes back positive for small cell lung cancer this makes it extensive stage disease and I would recommend upfront combination chemo immunotherapy with carbo etoposide given IV every [redacted] weeks along with Tecentriq for 4 cycles followed by maintenance Tecentriq until progression or toxicity.  Etoposide will be given on 3 consecutive days.  Discussed risks and benefits of chemotherapy including all but not limited to nausea,  vomiting, low blood counts, risk of infections and hospitalizations.  Risk of infusion reaction associated with carboplatin.  Discussed risks and benefits of immunotherapy including all but not limited to autoimmune side effect such as colitis thyroiditis endocrinopathies, need to monitor renal and kidney functions and pneumonitis.  Treatment will be given with a palliative intent.  Patient understands and agrees to proceed as planned.  Untreated small cell lung cancer has  a poor prognosis of less than 6 months.  Overall survival in stage IV lung cancer despite treatment is ~ 1.5 - 2 years.  Patient is willing to proceed with port placement and chemo teach for [place] etoposide Tecentriq.  I will tentatively see her back in 10 days to start first cycle of chemo immunotherapy.  We will also obtain baseline MRI brain with and without contrast to complete her staging work-up   Thank you for this kind referral and the opportunity to participate in the care of this patient   Visit Diagnosis 1. Small cell lung cancer in adult [facility])   2. Goals of care, counseling/discussion     Dr. Randa Evens, MD, MPH [facility] at [facility] [phone] 12/13/2020 5:08 PM

## 2020-12-15 NOTE — Progress Notes (Signed)
Patient on schedule for Bone Lesion biopsy followed by [facility] placement 12/18/2020, called and spoke with Lexine Baton /daughter on phone with pre procedure instructions given. Made aware to be here @ 0830, NPO after MN prior to procedure as well as driver post procedures/discharge, stated understanding.

## 2020-12-16 ENCOUNTER — Encounter: Payer: Self-pay | Admitting: Oncology

## 2020-12-16 ENCOUNTER — Inpatient Hospital Stay: Payer: Medicare Other

## 2020-12-16 ENCOUNTER — Other Ambulatory Visit: Payer: Self-pay

## 2020-12-16 ENCOUNTER — Other Ambulatory Visit: Payer: Self-pay | Admitting: *Deleted

## 2020-12-16 ENCOUNTER — Ambulatory Visit: Payer: Medicare Other | Admitting: Radiation Oncology

## 2020-12-16 MED ORDER — LIDOCAINE-PRILOCAINE 2.5-2.5 % EX CREA: 1.0000 "application " | TOPICAL_CREAM | CUTANEOUS | 1 refills | Status: DC | PRN

## 2020-12-17 ENCOUNTER — Encounter: Payer: Self-pay | Admitting: Oncology

## 2020-12-17 ENCOUNTER — Other Ambulatory Visit: Payer: Self-pay | Admitting: *Deleted

## 2020-12-17 ENCOUNTER — Other Ambulatory Visit: Payer: Self-pay | Admitting: Oncology

## 2020-12-17 ENCOUNTER — Other Ambulatory Visit: Payer: Self-pay | Admitting: Radiology

## 2020-12-17 MED ORDER — LORAZEPAM 0.5 MG PO TABS: 0.5000 mg | ORAL_TABLET | Freq: Four times a day (QID) | ORAL | 0 refills | Status: DC | PRN

## 2020-12-17 MED ORDER — PROCHLORPERAZINE MALEATE 10 MG PO TABS: 10.0000 mg | ORAL_TABLET | Freq: Four times a day (QID) | ORAL | 1 refills | Status: DC | PRN

## 2020-12-17 NOTE — Telephone Encounter (Signed)
Ok to send prescription for ativan 0.5 mg Q6 prn 120 tab no refills

## 2020-12-17 NOTE — H&P (Signed)
Chief Complaint: Patient was seen in consultation today for right iliac bone lesion biopsy and port-a-catheter placement.   Referring Physician(s): Sindy Guadeloupe  Supervising Physician: Sandi Mariscal  Patient Status: [facility] - Out-pt  History of Present Illness: Beth Mcmillan is a 75 y.o. female with a medical history significant for anxiety, HTN, COPD and long-term tobacco use. She is followed by Pulmonology for COPD and had an abnormal chest x-ray that prompted a CT scan on 11/05/20. This demonstrated a 2.6 cm left lower lobe mass and on 11/21/20 she underwent a bronchoscopy with biopsy. Pathology showed only benign findings and additional imaging was ordered.  NM PET 11/27/20 IMPRESSION: 1. LEFT lower lobe mass slightly larger than on recent imaging, potentially related to post biopsy changes or technical factors, with hypermetabolic features, suspicious for bronchogenic neoplasm. 2. Small cystic focus adjacent to this mass increased bronchial dilation or post biopsy changes. Superimposed inflammation is also considered. Correlate with any respiratory symptoms. 3. RIGHT iliac lesion suspicious for metastasis with marked hypermetabolic activity. 4. No additional signs of increased metabolic activity to suggest additional sites of disease. 5. Aortic atherosclerosis. 6. Calcified coronary artery disease. 7. Aortic Atherosclerosis (ICD10-I70.0).  She was then referred to IR for a CT-guided biopsy of the left lower lobe mass. 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   Interventional Radiology has now been asked to evaluate this patient for an image-guided right iliac bone lesion biopsy to evaluate for metastatic disease (case reviewed and procedure approved by Dr. Serafina Royals) and an image-guided port-a-catheter placement for chemotherapy.  Past Medical History:  Diagnosis Date  . Anxiety   .  Breathing problem    low breathing function 53%  . COPD (chronic obstructive pulmonary disease) (HCC)    EMPHYSEMA  . Dysrhythmia    tachycardia  . Emphysema of lung ([place])   . Hemorrhoid   . History of hiatal hernia    SMALL- 2022  . Hypercholesteremia   . Hypertension   . Hypothyroidism   . Larynx polyp   . Palpitations    with anxiety  . Platelets decreased ([place]) 2020  . Seasonal allergies   . Tinnitus   . Vertigo    no episodes in several years  . Wears dentures    partial lower  . White coat syndrome with hypertension     Past Surgical History:  Procedure Laterality Date  . ABDOMINAL HYSTERECTOMY     partial  . COLONOSCOPY WITH PROPOFOL N/A 01/22/2016   Procedure: COLONOSCOPY WITH PROPOFOL;  Surgeon: Lucilla Lame, MD;  Location: [place];  Service: Endoscopy;  Laterality: N/A;  . COLONOSCOPY WITH PROPOFOL N/A 04/12/2019   Procedure: COLONOSCOPY WITH BIOPSY;  Surgeon: Lucilla Lame, MD;  Location: [place];  Service: Endoscopy;  Laterality: N/A;  pt would like an early appt  . ct guided lung bx    . POLYPECTOMY  01/22/2016   Procedure: POLYPECTOMY;  Surgeon: Lucilla Lame, MD;  Location: [place];  Service: Endoscopy;;  . POLYPECTOMY N/A 04/12/2019   Procedure: POLYPECTOMY;  Surgeon: Lucilla Lame, MD;  Location: [place];  Service: Endoscopy;  Laterality: N/A;  . VIDEO BRONCHOSCOPY WITH ENDOBRONCHIAL NAVIGATION N/A 11/21/2020   Procedure: VIDEO BRONCHOSCOPY WITH ENDOBRONCHIAL NAVIGATION;  Surgeon: Ottie Glazier, MD;  Location: [facility];  Service: Thoracic;  Laterality: N/A;  . VIDEO BRONCHOSCOPY WITH ENDOBRONCHIAL ULTRASOUND N/A 11/21/2020   Procedure: VIDEO BRONCHOSCOPY WITH ENDOBRONCHIAL ULTRASOUND;  Surgeon:  Ottie Glazier, MD;  Location: [facility];  Service: Thoracic;  Laterality: N/A;    Allergies: Augmentin [amoxicillin-pot clavulanate], Codeine, Levaquin [levofloxacin], and Tape  Medications: Prior to Admission medications    Medication Sig Start Date End Date Taking? Authorizing Provider  ALPRAZolam Duanne Moron) 0.5 MG tablet Take 0.5 mg by mouth 2 (two) times daily as needed for anxiety (before procedures).    [provider]  amLODipine (NORVASC) 5 MG tablet Take 5 mg by mouth at bedtime.    [provider]  Ascorbic Acid (VITAMIN C) 500 MG CHEW Chew 500 mg by mouth daily.    [provider]  atorvastatin (LIPITOR) 10 MG tablet TAKE 1 TABLET BY MOUTH DAILY Patient taking differently: Take 10 mg by mouth at bedtime. 01/28/20   Cletis Athens, MD  cholecalciferol (VITAMIN D3) 25 MCG (1000 UNIT) tablet Take 1,000 Units by mouth daily.    [provider]  Homeopathic Products (FRANKINCENSE UPLIFTING) OIL Take 2 drops by mouth at bedtime.    [provider]  levothyroxine (SYNTHROID, LEVOTHROID) 50 MCG tablet Take 50 mcg by mouth daily before breakfast.    [provider]  lidocaine-prilocaine (EMLA) cream Apply 1 application topically as needed. Apply small amount over port site at least 1 hour before each treatment and cover with saran wrap to protect clothing 12/16/20   Sindy Guadeloupe, MD  loratadine (CLARITIN) 10 MG tablet Take 10 mg by mouth daily. Reported on 01/22/2016    [provider]  LORazepam (ATIVAN) 0.5 MG tablet Take 1 tablet (0.5 mg total) by mouth every 6 (six) hours as needed for anxiety. 12/17/20   Sindy Guadeloupe, MD  losartan-hydrochlorothiazide (HYZAAR) 100-25 MG tablet Take 1 tablet by mouth daily.    [provider]  metoprolol succinate (TOPROL-XL) 50 MG 24 hr tablet Take 50 mg by mouth daily. 07/01/14   [provider]  PARoxetine (PAXIL) 10 MG tablet Take 5 mg by mouth every evening.    [provider]  prochlorperazine (COMPAZINE) 10 MG tablet Take 1 tablet (10 mg total) by mouth every 6 (six) hours as needed for nausea or vomiting. 12/17/20   Sindy Guadeloupe, MD  vitamin E 180 MG (400 UNITS) capsule Take 400 Units by  mouth daily.    [provider]     Family History  Problem Relation Age of Onset  . Breast cancer Neg Hx     Social History   Socioeconomic History  . Marital status: Married    Spouse name: Not on file  . Number of children: Not on file  . Years of education: Not on file  . Highest education level: Not on file  Occupational History  . Not on file  Tobacco Use  . Smoking status: Former Smoker    Packs/day: 0.50    Years: 50.00    Pack years: 25.00    Types: Cigarettes    Quit date: 11/03/2020    Years since quitting: 0.1  . Smokeless tobacco: Never Used  Vaping Use  . Vaping Use: Some days  Substance and Sexual Activity  . Alcohol use: No  . Drug use: Never  . Sexual activity: Not on file  Other Topics Concern  . Not on file  Social History Narrative  . Not on file   Social Determinants of Health   Financial Resource Strain: Not on file  Food Insecurity: Not on file  Transportation Needs: Not on file  Physical Activity: Not on file  Stress: Not on file  Social Connections: Not on file    Review of Systems: A 12 point ROS discussed and pertinent positives are indicated in the HPI above.  All other systems are negative.  Review of Systems  Constitutional: Negative for appetite change and fatigue.  Respiratory: Negative for cough and shortness of breath.   Cardiovascular: Negative for chest pain and leg swelling.  Gastrointestinal: Positive for nausea. Negative for abdominal pain, diarrhea and vomiting.  Musculoskeletal: Positive for arthralgias. Negative for back pain.       Right hip/leg pain; intermittent  Neurological: Negative for dizziness and headaches.    Vital Signs: BP (!) 136/59   Pulse (!) 56   Temp 98.6 F (37 C) (Oral)   Resp 17   Ht 5\' 7"  (1.702 m)   Wt 144 lb (65.3 kg)   SpO2 100%   BMI 22.55 kg/m   Physical Exam Constitutional:      General: She is not in acute distress.    Appearance: She is not ill-appearing.  HENT:      Mouth/Throat:     Mouth: Mucous membranes are moist.     Pharynx: Oropharynx is clear.  Cardiovascular:     Rate and Rhythm: Normal rate and regular rhythm.     Pulses: Normal pulses.     Heart sounds: Normal heart sounds.  Pulmonary:     Effort: Pulmonary effort is normal.     Breath sounds: Normal breath sounds.  Abdominal:     General: Bowel sounds are normal.     Palpations: Abdomen is soft.     Tenderness: There is no abdominal tenderness.  Musculoskeletal:     Right lower leg: No edema.     Left lower leg: No edema.  Skin:    General: Skin is warm and dry.  Neurological:     Mental Status: She is alert and oriented to person, place, and time.     Imaging: CT CHEST WO CONTRAST  Result Date: 11/19/2020 CLINICAL DATA:  Left lower lobe lung nodule. EXAM: CT CHEST WITHOUT CONTRAST TECHNIQUE: Multidetector CT imaging of the chest was performed following the standard protocol without IV contrast. COMPARISON:  Chest CT 15 days ago 11/04/2020 FINDINGS: Cardiovascular: Aortic atherosclerosis. No aortic aneurysm. Heart is normal in size. There are coronary artery calcifications. No pericardial effusion. Mediastinum/Nodes: Hilar adenopathy is not accurately assessed on this noncontrast exam. There are scattered small mediastinal lymph nodes are not enlarged by size criteria, and stable. Tiny hiatal hernia. No thyroid nodule. Lungs/Pleura: Lobulated left lower lobe pulmonary nodule measures 2.7 x 2.6 x 2.3 cm, not significantly changed allowing for differences in caliper placement. Margins are microlobulated. Nodule is slightly central low-density. No internal air. Minimal adjacent linear opacities likely atelectasis. Additional scattered subsegmental linear atelectasis or scarring in the right middle lobe and lingula. No other pulmonary nodule. Mild emphysema. Mild biapical pleuroparenchymal scarring. Improved retained secretions in the trachea. No pleural fluid. Upper Abdomen: Minimal  adrenal thickening without focal nodule. No acute upper abdominal findings. Musculoskeletal: No focal bone lesion or acute osseous abnormality. No chest wall soft tissue abnormality. IMPRESSION: 1. Left lower lobe pulmonary nodule measuring 2.7 cm, not significantly changed in size over the past 15 days. This is suspicious for primary bronchogenic malignancy. Recommend further evaluation with PET/CT or tissue sampling. 2. No mediastinal adenopathy. Hilar adenopathy is not accurately assessed on this noncontrast exam. No hilar adenopathy is seen on recent contrast-enhanced exam. 3. Emphysema. 4. Coronary artery calcifications. Aortic  Atherosclerosis (ICD10-I70.0) and Emphysema (ICD10-J43.9). Electronically Signed   By: Keith Rake M.D.   On: 11/19/2020 20:19   NM PET Image Initial (PI) Skull Base To Thigh  Result Date: 11/28/2020 CLINICAL DATA:  Initial treatment strategy for LEFT lower lobe nodule status post bronchoscopic biopsy in a 75 year old female. EXAM: NUCLEAR MEDICINE PET SKULL BASE TO THIGH TECHNIQUE: 7.41 mCi F-18 FDG was injected intravenously. Full-ring PET imaging was performed from the skull base to thigh after the radiotracer. CT data was obtained and used for attenuation correction and anatomic localization. Fasting blood glucose: 85 mg/dl COMPARISON:  Prior chest CT dated November 19, 2020 FINDINGS: Mediastinal blood pool activity: SUV max 2.37 Liver activity: SUV max NA NECK: No hypermetabolic lymph nodes in the neck. Incidental CT findings: none CHEST: LEFT lung mass in the LEFT lower lobe measuring 3.0 x 2.0 cm on image 109 of series 3 with a maximum SUV of 9.7. Interval development of either bronchial dilation or cystic change since the prior study with small amount of surrounding stranding no mediastinal adenopathy. Basilar atelectasis. Incidental CT findings: Densely calcified atheromatous plaque in the thoracic aorta without aneurysmal dilation. Normal caliber central pulmonary  vasculature. Normal heart size without substantial pericardial effusion. Signs of coronary artery calcification as before. Esophagus mildly patulous. ABDOMEN/PELVIS: No abnormal hypermetabolic activity within the liver, pancreas, adrenal glands, or spleen. No hypermetabolic lymph nodes in the abdomen or pelvis. Incidental CT findings: Liver, gallbladder, spleen, pancreas, adrenal glands and kidneys without acute process. Stomach and small bowel unremarkable on noncontrast CT. Normal appendix. Stool fills much of the colon. Aortic atherosclerosis without aneurysm. Post hysterectomy. SKELETON: RIGHT iliac with hypermetabolic focus on image 867 of series 3 with a maximum SUV of 11.1. Only subtle variable sclerosis without definable lesion in this location. Area measures approximately 2.5 cm greatest dimension no additional hypermetabolic focus in the visualized skeleton. Incidental CT findings: none IMPRESSION: LEFT lower lobe mass slightly larger than on recent imaging, potentially related to post biopsy changes or technical factors, with hypermetabolic features, suspicious for bronchogenic neoplasm. Small cystic focus adjacent to this mass increased bronchial dilation or post biopsy changes. Superimposed inflammation is also considered. Correlate with any respiratory symptoms. RIGHT iliac lesion suspicious for metastasis with marked hypermetabolic activity. No additional signs of increased metabolic activity to suggest additional sites of disease. Aortic atherosclerosis. Calcified coronary artery disease. Aortic Atherosclerosis (ICD10-I70.0). Electronically Signed   By: Zetta Bills M.D.   On: 11/28/2020 17:08   CT BIOPSY  Result Date: 12/04/2020 INDICATION: 75 year old woman with FDG avid left lower lobe lung mass presents to interventional radiology for CT-guided biopsy. EXAM: CT-guided biopsy of left lower lobe mass MEDICATIONS: None. ANESTHESIA/SEDATION: Moderate (conscious) sedation was employed during  this procedure. A total of Versed 1 mg and Fentanyl 50 mcg was administered intravenously. Moderate Sedation Time: 19 minutes. The patient's level of consciousness and vital signs were monitored continuously by radiology nursing throughout the procedure under my direct supervision. COMPLICATIONS: None immediate. PROCEDURE: Informed written consent was obtained from the patient after a thorough discussion of the procedural risks, benefits and alternatives. All questions were addressed. Maximal Sterile Barrier Technique was utilized including caps, mask, sterile gowns, sterile gloves, sterile drape, hand hygiene and skin antiseptic. A timeout was performed prior to the initiation of the procedure. Patient position supine on the CT table. Left lateral chest wall skin prepped and draped in usual sterile fashion. Following local lidocaine administration, 17 gauge introducer needle was advanced into the left lower lobe  lung mass utilizing CT guidance. Three cores were obtained from the left lower lobe lung mass and sent to pathology in formalin. Needle removed and hemostasis achieved with manual compression. IMPRESSION: CT-guided biopsy of left lower lobe lung mass as above. Electronically Signed   By: Miachel Roux M.D.   On: 12/04/2020 14:10   DG Chest Port 1 View  Result Date: 12/04/2020 CLINICAL DATA:  75 year old woman status post left lower lobe lung mass biopsy. EXAM: PORTABLE CHEST 1 VIEW COMPARISON:  Chest radiograph 06/05/2013 FINDINGS: Cardiomediastinal silhouette and pulmonary vasculature are within normal limits. Left lower lobe lung mass again seen. Haziness around the mass likely due to prior lesional hemorrhage. No pneumothorax identified. Atherosclerotic changes of the thoracic aorta again seen. IMPRESSION: No pneumothorax status post left lower lobe lung mass biopsy. Electronically Signed   By: Miachel Roux M.D.   On: 12/04/2020 14:12   DG C-Arm 1-60 Min-No Report  Result Date:  11/21/2020 Fluoroscopy was utilized by the requesting physician.  No radiographic interpretation.    Labs:  CBC: Recent Labs    11/20/20 1249 12/04/20 1029 12/18/20 0913  WBC 9.1 9.0 8.3  HGB 13.7 12.9 13.1  HCT 39.7 37.2 36.7  PLT 242 222 274    COAGS: Recent Labs    11/20/20 1249 12/04/20 1029  INR 1.0 1.0  APTT 35  --     BMP: No results for input(s): NA, K, CL, CO2, GLUCOSE, BUN, CALCIUM, CREATININE, GFRNONAA, GFRAA in the last 8760 hours.  Invalid input(s): CMP  LIVER FUNCTION TESTS: No results for input(s): BILITOT, AST, ALT, ALKPHOS, PROT, ALBUMIN in the last 8760 hours.  TUMOR MARKERS: No results for input(s): AFPTM, CEA, CA199, CHROMGRNA in the last 8760 hours.  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  Risks and benefits of a right iliac bone lesion biopsy were discussed with the patient and/or patient's family including, but not limited to bleeding, infection, damage to adjacent structures or low yield requiring additional tests.  Risks and benefits of image-guided port-a-catheter placement were discussed with the patient including, but not limited to bleeding, infection, pneumothorax, or fibrin sheath development and need for additional procedures.  All of the patient's questions were answered, patient is agreeable to proceed.  Consent signed and in chart.   All of the questions were answered and there is agreement to proceed. She has been NPO. Labs and vitals have been reviewed.   Consent signed and in chart.   Thank you for this interesting consult.  I greatly enjoyed meeting Beth Mcmillan and look forward to participating in their care.  A copy of this report was sent to  the requesting provider on this date.  Electronically Signed: Soyla Dryer, AGACNP-BC [phone] 12/18/2020, 9:21 AM   I spent a total of  30 Minutes   in face to face in clinical consultation, greater than 50% of which was counseling/coordinating care for bone lesion biopsy and port-a-catheter placement.

## 2020-12-18 ENCOUNTER — Ambulatory Visit: Payer: Medicare Other

## 2020-12-18 ENCOUNTER — Other Ambulatory Visit: Payer: Self-pay

## 2020-12-18 ENCOUNTER — Other Ambulatory Visit: Payer: Self-pay | Admitting: Oncology

## 2020-12-18 ENCOUNTER — Ambulatory Visit
Admission: RE | Admit: 2020-12-18 | Discharge: 2020-12-18 | Disposition: A | Discharge status: Home / Self Care | Payer: Medicare Other | Source: Ambulatory Visit | Attending: Oncology | Admitting: Oncology

## 2020-12-18 DIAGNOSIS — Z881 Allergy status to other antibiotic agents status: Secondary | ICD-10-CM | POA: Diagnosis not present

## 2020-12-18 DIAGNOSIS — Z79899 Other long term (current) drug therapy: Secondary | ICD-10-CM | POA: Diagnosis not present

## 2020-12-18 DIAGNOSIS — Z888 Allergy status to other drugs, medicaments and biological substances status: Secondary | ICD-10-CM | POA: Insufficient documentation

## 2020-12-18 DIAGNOSIS — Z885 Allergy status to narcotic agent status: Secondary | ICD-10-CM | POA: Diagnosis not present

## 2020-12-18 DIAGNOSIS — C7951 Secondary malignant neoplasm of bone: Secondary | ICD-10-CM | POA: Diagnosis not present

## 2020-12-18 DIAGNOSIS — R948 Abnormal results of function studies of other organs and systems: Secondary | ICD-10-CM

## 2020-12-18 DIAGNOSIS — C3490 Malignant neoplasm of unspecified part of unspecified bronchus or lung: Secondary | ICD-10-CM | POA: Insufficient documentation

## 2020-12-18 DIAGNOSIS — Z87891 Personal history of nicotine dependence: Secondary | ICD-10-CM | POA: Diagnosis not present

## 2020-12-18 DIAGNOSIS — Z79890 Hormone replacement therapy (postmenopausal): Secondary | ICD-10-CM | POA: Diagnosis not present

## 2020-12-18 HISTORY — PX: IR IMAGING GUIDED PORT INSERTION: IMG5740

## 2020-12-18 LAB — CBC
HCT: 36.7 % (ref 36.0–46.0)
Hemoglobin: 13.1 g/dL (ref 12.0–15.0)
MCH: 31.7 pg (ref 26.0–34.0)
MCHC: 35.7 g/dL (ref 30.0–36.0)
MCV: 88.9 fL (ref 80.0–100.0)
Platelets: 274 10*3/uL (ref 150–400)
RBC: 4.13 MIL/uL (ref 3.87–5.11)
RDW: 12.5 % (ref 11.5–15.5)
WBC: 8.3 10*3/uL (ref 4.0–10.5)
nRBC: 0 % (ref 0.0–0.2)

## 2020-12-18 LAB — PROTIME-INR
INR: 0.9 (ref 0.8–1.2)
Prothrombin Time: 12.4 seconds (ref 11.4–15.2)

## 2020-12-18 MED ORDER — ONDANSETRON HCL 8 MG PO TABS: 8.0000 mg | ORAL_TABLET | Freq: Two times a day (BID) | ORAL | 1 refills | Status: DC | PRN

## 2020-12-18 MED ORDER — FENTANYL CITRATE (PF) 100 MCG/2ML IJ SOLN
INTRAMUSCULAR | Status: AC | PRN
  Administered 2020-12-18: 50 ug via INTRAVENOUS

## 2020-12-18 MED ORDER — FENTANYL CITRATE (PF) 100 MCG/2ML IJ SOLN
INTRAMUSCULAR | Status: AC
  Filled 2020-12-18: qty 2

## 2020-12-18 MED ORDER — MIDAZOLAM HCL 5 MG/5ML IJ SOLN
INTRAMUSCULAR | Status: AC
  Filled 2020-12-18: qty 5

## 2020-12-18 MED ORDER — MIDAZOLAM HCL 2 MG/2ML IJ SOLN
INTRAMUSCULAR | Status: AC
  Filled 2020-12-18: qty 2

## 2020-12-18 MED ORDER — MIDAZOLAM HCL 2 MG/2ML IJ SOLN
INTRAMUSCULAR | Status: AC | PRN
  Administered 2020-12-18: 1 mg via INTRAVENOUS

## 2020-12-18 MED ORDER — HEPARIN SOD (PORK) LOCK FLUSH 100 UNIT/ML IV SOLN
INTRAVENOUS | Status: AC
  Filled 2020-12-18: qty 5

## 2020-12-18 MED ORDER — SODIUM CHLORIDE 0.9 % IV SOLN: INTRAVENOUS | Status: DC

## 2020-12-18 MED ORDER — LIDOCAINE-PRILOCAINE 2.5-2.5 % EX CREA: TOPICAL_CREAM | CUTANEOUS | 3 refills | Status: DC

## 2020-12-18 MED ORDER — PROCHLORPERAZINE MALEATE 10 MG PO TABS: 10.0000 mg | ORAL_TABLET | Freq: Four times a day (QID) | ORAL | 1 refills | Status: DC | PRN

## 2020-12-18 MED ORDER — MIDAZOLAM HCL 5 MG/5ML IJ SOLN
INTRAMUSCULAR | Status: AC | PRN
  Administered 2020-12-18: 1 mg via INTRAVENOUS

## 2020-12-18 NOTE — Procedures (Signed)
Pre Procedure Dx: Poor venous access Post Procedural Dx: Same  Successful placement of right IJ approach port-a-cath with tip at the superior caval atrial junction. The catheter is ready for immediate use.  Estimated Blood Loss: Minimal  Complications: None immediate.  Jay Willodene Stallings, MD Pager #: [phone]   

## 2020-12-18 NOTE — Progress Notes (Addendum)
Patient transferred to [place] lab for port placement.

## 2020-12-18 NOTE — Progress Notes (Signed)
START ON PATHWAY REGIMEN - Small Cell Lung     Cycles 1 through 4, every 21 days:     Atezolizumab      Carboplatin      Etoposide    Cycles 5 and beyond, every 21 days:     Atezolizumab   **Always confirm dose/schedule in your pharmacy ordering system**  Patient Characteristics: Newly Diagnosed, Preoperative or Nonsurgical Candidate (Clinical Staging), First Line, Extensive Stage Therapeutic Status: Newly Diagnosed, Preoperative or Nonsurgical Candidate (Clinical Staging) AJCC T Category: cT2 AJCC N Category: cN0 AJCC M Category: cM1 AJCC 8 Stage Grouping: IV Stage Classification: Extensive  Intent of Therapy: Non-Curative / Palliative Intent, Discussed with Patient

## 2020-12-18 NOTE — Procedures (Signed)
Pre-procedure Diagnosis: Hypermetabolic right iliac lesion Post-procedure Diagnosis: Same  Technically successful CT guided biopsy of lytic lesion involving the right ilium.   Complications: None Immediate EBL: None  Signed: Sandi Mariscal Pager: [phone] 12/18/2020, 10:50 AM

## 2020-12-19 ENCOUNTER — Encounter: Payer: Self-pay | Admitting: Oncology

## 2020-12-19 ENCOUNTER — Telehealth: Payer: Self-pay | Admitting: *Deleted

## 2020-12-19 LAB — SURGICAL PATHOLOGY

## 2020-12-19 NOTE — Telephone Encounter (Signed)
Beth Mcmillan called reporting that patient had port inserted yesterday and now she has a broad red band under the insertion site and downward. She is asking what to do. Please advise

## 2020-12-19 NOTE — Telephone Encounter (Signed)
Call returned to patient and advised per NP L Zenia Resides - Please let her know that I suspect the redness is post procedural inflammation. If she develops pain or symptoms worsen, please let us know.

## 2020-12-19 NOTE — Telephone Encounter (Signed)
I called pt to tell her about emla cream and it is under review with insurance and we will not get the decision til  Monday. That is when pt suppose to come in for chemo. I did check with her pahrmacy and they do have lidocaine cream 5% over the counter that will work just as good since she we will not have answer to the approval before Monday. She says that she can pay for it. I told her that if she wanted to try using good [facility]. A couple of out pt's has used it for emla cream and had to pay about 20 dollars for it. She has options. And with her port she is welcome to ice pack for 10-15 min but making sure she has wash cloth with it so the ice pack is not directly on skin.  She says it is not even sure. I told her that is great.

## 2020-12-19 NOTE — Telephone Encounter (Signed)
Can you call her?

## 2020-12-20 ENCOUNTER — Other Ambulatory Visit: Payer: Self-pay | Admitting: *Deleted

## 2020-12-20 DIAGNOSIS — C3490 Malignant neoplasm of unspecified part of unspecified bronchus or lung: Secondary | ICD-10-CM

## 2020-12-22 ENCOUNTER — Other Ambulatory Visit: Payer: Self-pay

## 2020-12-22 ENCOUNTER — Inpatient Hospital Stay (HOSPITAL_BASED_OUTPATIENT_CLINIC_OR_DEPARTMENT_OTHER): Payer: Medicare Other | Admitting: Oncology

## 2020-12-22 ENCOUNTER — Encounter: Payer: Self-pay | Admitting: Oncology

## 2020-12-22 ENCOUNTER — Inpatient Hospital Stay: Payer: Medicare Other

## 2020-12-22 ENCOUNTER — Encounter: Payer: Self-pay | Admitting: *Deleted

## 2020-12-22 VITALS — BP 152/62 | HR 72 | Temp 98.5°F | Resp 16 | Ht 67.0 in | Wt 144.8 lb

## 2020-12-22 DIAGNOSIS — Z8719 Personal history of other diseases of the digestive system: Secondary | ICD-10-CM | POA: Insufficient documentation

## 2020-12-22 DIAGNOSIS — I700 Atherosclerosis of aorta: Secondary | ICD-10-CM | POA: Insufficient documentation

## 2020-12-22 DIAGNOSIS — Z298 Encounter for other specified prophylactic measures: Secondary | ICD-10-CM | POA: Insufficient documentation

## 2020-12-22 DIAGNOSIS — Z8349 Family history of other endocrine, nutritional and metabolic diseases: Secondary | ICD-10-CM | POA: Insufficient documentation

## 2020-12-22 DIAGNOSIS — I2510 Atherosclerotic heart disease of native coronary artery without angina pectoris: Secondary | ICD-10-CM | POA: Insufficient documentation

## 2020-12-22 DIAGNOSIS — R630 Anorexia: Secondary | ICD-10-CM | POA: Insufficient documentation

## 2020-12-22 DIAGNOSIS — E871 Hypo-osmolality and hyponatremia: Secondary | ICD-10-CM | POA: Insufficient documentation

## 2020-12-22 DIAGNOSIS — Z885 Allergy status to narcotic agent status: Secondary | ICD-10-CM | POA: Insufficient documentation

## 2020-12-22 DIAGNOSIS — Z79899 Other long term (current) drug therapy: Secondary | ICD-10-CM | POA: Insufficient documentation

## 2020-12-22 DIAGNOSIS — Z881 Allergy status to other antibiotic agents status: Secondary | ICD-10-CM | POA: Insufficient documentation

## 2020-12-22 DIAGNOSIS — C3490 Malignant neoplasm of unspecified part of unspecified bronchus or lung: Secondary | ICD-10-CM | POA: Diagnosis not present

## 2020-12-22 DIAGNOSIS — E876 Hypokalemia: Secondary | ICD-10-CM | POA: Insufficient documentation

## 2020-12-22 DIAGNOSIS — Z5112 Encounter for antineoplastic immunotherapy: Secondary | ICD-10-CM | POA: Insufficient documentation

## 2020-12-22 DIAGNOSIS — Z87891 Personal history of nicotine dependence: Secondary | ICD-10-CM | POA: Insufficient documentation

## 2020-12-22 DIAGNOSIS — Z801 Family history of malignant neoplasm of trachea, bronchus and lung: Secondary | ICD-10-CM | POA: Insufficient documentation

## 2020-12-22 DIAGNOSIS — C7931 Secondary malignant neoplasm of brain: Secondary | ICD-10-CM | POA: Insufficient documentation

## 2020-12-22 DIAGNOSIS — Z5111 Encounter for antineoplastic chemotherapy: Secondary | ICD-10-CM | POA: Insufficient documentation

## 2020-12-22 DIAGNOSIS — Z818 Family history of other mental and behavioral disorders: Secondary | ICD-10-CM | POA: Insufficient documentation

## 2020-12-22 DIAGNOSIS — Z8049 Family history of malignant neoplasm of other genital organs: Secondary | ICD-10-CM | POA: Insufficient documentation

## 2020-12-22 DIAGNOSIS — D61810 Antineoplastic chemotherapy induced pancytopenia: Secondary | ICD-10-CM | POA: Diagnosis not present

## 2020-12-22 DIAGNOSIS — C3432 Malignant neoplasm of lower lobe, left bronchus or lung: Secondary | ICD-10-CM | POA: Insufficient documentation

## 2020-12-22 DIAGNOSIS — Z95828 Presence of other vascular implants and grafts: Secondary | ICD-10-CM

## 2020-12-22 DIAGNOSIS — R634 Abnormal weight loss: Secondary | ICD-10-CM | POA: Insufficient documentation

## 2020-12-22 DIAGNOSIS — Z880 Allergy status to penicillin: Secondary | ICD-10-CM | POA: Insufficient documentation

## 2020-12-22 DIAGNOSIS — Z888 Allergy status to other drugs, medicaments and biological substances status: Secondary | ICD-10-CM | POA: Insufficient documentation

## 2020-12-22 DIAGNOSIS — C7951 Secondary malignant neoplasm of bone: Secondary | ICD-10-CM | POA: Insufficient documentation

## 2020-12-22 DIAGNOSIS — Z8249 Family history of ischemic heart disease and other diseases of the circulatory system: Secondary | ICD-10-CM | POA: Insufficient documentation

## 2020-12-22 DIAGNOSIS — R5383 Other fatigue: Secondary | ICD-10-CM | POA: Insufficient documentation

## 2020-12-22 DIAGNOSIS — G939 Disorder of brain, unspecified: Secondary | ICD-10-CM | POA: Insufficient documentation

## 2020-12-22 DIAGNOSIS — Z510 Encounter for antineoplastic radiation therapy: Secondary | ICD-10-CM | POA: Diagnosis present

## 2020-12-22 LAB — COMPREHENSIVE METABOLIC PANEL
ALT: 18 U/L (ref 0–44)
AST: 27 U/L (ref 15–41)
Albumin: 4 g/dL (ref 3.5–5.0)
Alkaline Phosphatase: 65 U/L (ref 38–126)
Anion gap: 13 (ref 5–15)
BUN: 10 mg/dL (ref 8–23)
CO2: 28 mmol/L (ref 22–32)
Calcium: 9.6 mg/dL (ref 8.9–10.3)
Chloride: 88 mmol/L — ABNORMAL LOW (ref 98–111)
Creatinine, Ser: 0.78 mg/dL (ref 0.44–1.00)
GFR, Estimated: >60 mL/min (ref >60)
Glucose, Bld: 108 mg/dL — ABNORMAL HIGH (ref 70–99)
Potassium: 3.1 mmol/L — ABNORMAL LOW (ref 3.5–5.1)
Sodium: 129 mmol/L — ABNORMAL LOW (ref 135–145)
Total Bilirubin: 0.9 mg/dL (ref 0.3–1.2)
Total Protein: 6.9 g/dL (ref 6.5–8.1)

## 2020-12-22 LAB — CBC WITH DIFFERENTIAL/PLATELET
Abs Immature Granulocytes: 0.05 10*3/uL (ref 0.00–0.07)
Basophils Absolute: 0.1 10*3/uL (ref 0.0–0.1)
Basophils Relative: 1 %
Eosinophils Absolute: 0.1 10*3/uL (ref 0.0–0.5)
Eosinophils Relative: 1 %
HCT: 34.4 % — ABNORMAL LOW (ref 36.0–46.0)
Hemoglobin: 12.2 g/dL (ref 12.0–15.0)
Immature Granulocytes: 1 %
Lymphocytes Relative: 20 %
Lymphs Abs: 1.8 10*3/uL (ref 0.7–4.0)
MCH: 32.4 pg (ref 26.0–34.0)
MCHC: 35.5 g/dL (ref 30.0–36.0)
MCV: 91.5 fL (ref 80.0–100.0)
Monocytes Absolute: 0.9 10*3/uL (ref 0.1–1.0)
Monocytes Relative: 10 %
Neutro Abs: 6.2 10*3/uL (ref 1.7–7.7)
Neutrophils Relative %: 67 %
Platelets: 246 10*3/uL (ref 150–400)
RBC: 3.76 MIL/uL — ABNORMAL LOW (ref 3.87–5.11)
RDW: 12.9 % (ref 11.5–15.5)
WBC: 9.1 10*3/uL (ref 4.0–10.5)
nRBC: 0 % (ref 0.0–0.2)

## 2020-12-22 LAB — HEPATITIS B CORE ANTIBODY, TOTAL: Hep B Core Total Ab: NONREACTIVE

## 2020-12-22 LAB — TSH: TSH: 1.602 u[IU]/mL (ref 0.350–4.500)

## 2020-12-22 LAB — HEPATITIS B SURFACE ANTIGEN: Hepatitis B Surface Ag: NONREACTIVE

## 2020-12-22 MED ORDER — SODIUM CHLORIDE 0.9 % IV SOLN
10.0000 mg | Freq: Once | INTRAVENOUS | Status: AC
  Administered 2020-12-22: 10 mg via INTRAVENOUS
  Filled 2020-12-22: qty 10

## 2020-12-22 MED ORDER — PALONOSETRON HCL INJECTION 0.25 MG/5ML
0.2500 mg | Freq: Once | INTRAVENOUS | Status: AC
  Administered 2020-12-22: 0.25 mg via INTRAVENOUS
  Filled 2020-12-22: qty 5

## 2020-12-22 MED ORDER — HEPARIN SOD (PORK) LOCK FLUSH 100 UNIT/ML IV SOLN
500.0000 [IU] | Freq: Once | INTRAVENOUS | Status: DC | PRN
  Filled 2020-12-22: qty 5

## 2020-12-22 MED ORDER — SODIUM CHLORIDE 0.9 % IV SOLN
Freq: Once | INTRAVENOUS | Status: AC
  Filled 2020-12-22: qty 250

## 2020-12-22 MED ORDER — SODIUM CHLORIDE 0.9 % IV SOLN
1200.0000 mg | Freq: Once | INTRAVENOUS | Status: AC
  Administered 2020-12-22: 1200 mg via INTRAVENOUS
  Filled 2020-12-22: qty 20

## 2020-12-22 MED ORDER — POTASSIUM CHLORIDE IN NACL 20-0.9 MEQ/L-% IV SOLN
Freq: Once | INTRAVENOUS | Status: AC
  Filled 2020-12-22: qty 1000

## 2020-12-22 MED ORDER — HEPARIN SOD (PORK) LOCK FLUSH 100 UNIT/ML IV SOLN
INTRAVENOUS | Status: AC
  Filled 2020-12-22: qty 5

## 2020-12-22 MED ORDER — SODIUM CHLORIDE 0.9 % IV SOLN
383.5000 mg | Freq: Once | INTRAVENOUS | Status: AC
  Administered 2020-12-22: 380 mg via INTRAVENOUS
  Filled 2020-12-22: qty 38

## 2020-12-22 MED ORDER — SODIUM CHLORIDE 0.9% FLUSH
10.0000 mL | Freq: Once | INTRAVENOUS | Status: AC
Start: 2020-12-22 — End: 2020-12-22
  Administered 2020-12-22: 10 mL via INTRAVENOUS
  Filled 2020-12-22: qty 10

## 2020-12-22 MED ORDER — SODIUM CHLORIDE 0.9 % IV SOLN
150.0000 mg | Freq: Once | INTRAVENOUS | Status: AC
  Administered 2020-12-22: 150 mg via INTRAVENOUS
  Filled 2020-12-22: qty 150

## 2020-12-22 MED ORDER — HEPARIN SOD (PORK) LOCK FLUSH 100 UNIT/ML IV SOLN
500.0000 [IU] | Freq: Once | INTRAVENOUS | Status: AC
  Administered 2020-12-22: 500 [IU] via INTRAVENOUS
  Filled 2020-12-22: qty 5

## 2020-12-22 MED ORDER — SODIUM CHLORIDE 0.9 % IV SOLN
100.0000 mg/m2 | Freq: Once | INTRAVENOUS | Status: AC
  Administered 2020-12-22: 180 mg via INTRAVENOUS
  Filled 2020-12-22: qty 9

## 2020-12-22 NOTE — Progress Notes (Signed)
  Oncology Nurse Navigator Documentation  Navigator Location: [facility] Onc (12/22/20 1000)   )Navigator Encounter Type: Follow-up Appt;Treatment (12/22/20 1000)     Confirmed Diagnosis Date: 12/08/20 (12/22/20 1000)             Treatment Initiated Date: 12/22/20 (12/22/20 1000) Patient Visit Type: MedOnc (12/22/20 1000) Treatment Phase: First Chemo Tx (12/22/20 1000) Barriers/Navigation Needs: Coordination of Care (12/22/20 1000)   Interventions: Coordination of Care (12/22/20 1000)   Coordination of Care: Appts;Chemo (12/22/20 1000)        Acuity: Level 2-Minimal Needs (1-2 Barriers Identified) (12/22/20 1000)    met with patient and her daughter during follow up visit with Dr. Janese Banks to start chemotherapy. All questions answered during visit. Per Dr. Janese Banks, pt will need dental clearance prior to starting biphosphonate treatment. Letter faxed to pt's dentist to obtain clearance. Contact info given to pt and her daughter. Instructed to call with any further questions or needs. Pt verbalized understanding. Nothing further needed at this time.     Time Spent with Patient: 60 (12/22/20 1000)

## 2020-12-22 NOTE — Progress Notes (Signed)
Pt here for new treatment. No concerns at this time

## 2020-12-22 NOTE — Progress Notes (Signed)
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  She underwent CT chest with contrast in March 2022 following an abnormal x-ray which showed a 2.6 cm mass in the left lower lobe.  This was followed by a PET CT scan which showed a 3.0 x 2.0 cm mass in the left lower lobe of the lung with an SUV of 9.7.  No evidence of locoregional adenopathy.  No evidence of intra-abdominal disease with patient was noted to have a hypermetabolic focus in the right iliac bone with an SUV of 11.1 which was suspicious for metastatic disease.  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  Cells were positive for TTF-1, CD56 and chromogranin.  Right iliac bone biopsy was done and results were consistent with small cell lung cancer   Interval history-patient is anxious about starting treatment today but is overall feeling well.  She has baseline fatigue but denies any new complaints at this time  ECOG PS- 1 Pain scale- 0   Review of systems- Review of Systems  Constitutional: Positive for malaise/fatigue. Negative for chills, fever and weight loss.  HENT: Negative for congestion, ear discharge and nosebleeds.   Eyes: Negative for blurred vision.  Respiratory: Negative for cough, hemoptysis, sputum production, shortness of breath  and wheezing.   Cardiovascular: Negative for chest pain, palpitations, orthopnea and claudication.  Gastrointestinal: Negative for abdominal pain, blood in stool, constipation, diarrhea, heartburn, melena, nausea and vomiting.  Genitourinary: Negative for dysuria, flank pain, frequency, hematuria and urgency.  Musculoskeletal: Negative for back pain, joint pain and myalgias.  Skin: Negative for rash.  Neurological: Negative for dizziness, tingling, focal weakness, seizures, weakness and headaches.  Endo/Heme/Allergies: Does not bruise/bleed easily.  Psychiatric/Behavioral: Negative for depression and suicidal ideas. The patient does not have insomnia.       Allergies  Allergen Reactions  . Augmentin [Amoxicillin-Pot Clavulanate]     "messes up my blood cells"  . Codeine Nausea Only  . Levaquin [Levofloxacin]     Affected lab results   . Tape Rash    Some bandaids cause rash sometimes     Past Medical History:  Diagnosis Date  . Anxiety   . Breathing problem    low breathing function 53%  . COPD (chronic obstructive pulmonary disease) (HCC)    EMPHYSEMA  . Dysrhythmia    tachycardia  . Emphysema of lung ([place])   . Hemorrhoid   . History of hiatal hernia    SMALL- 2022  . Hypercholesteremia   . Hypertension   . Hypothyroidism   . Larynx polyp   . Lung cancer ([place])   . Palpitations    with anxiety  . Platelets decreased ([place]) 2020  . Seasonal allergies   . Tinnitus   . Vertigo    no episodes in several years  . Wears dentures    partial lower  . White coat syndrome with hypertension  Past Surgical History:  Procedure Laterality Date  . ABDOMINAL HYSTERECTOMY     partial  . COLONOSCOPY WITH PROPOFOL N/A 01/22/2016   Procedure: COLONOSCOPY WITH PROPOFOL;  Surgeon: Lucilla Lame, MD;  Location: [place];  Service: Endoscopy;  Laterality: N/A;  . COLONOSCOPY WITH PROPOFOL N/A 04/12/2019   Procedure: COLONOSCOPY WITH BIOPSY;  Surgeon: Lucilla Lame, MD;   Location: [place];  Service: Endoscopy;  Laterality: N/A;  pt would like an early appt  . ct guided lung bx    . IR IMAGING GUIDED PORT INSERTION  12/18/2020  . POLYPECTOMY  01/22/2016   Procedure: POLYPECTOMY;  Surgeon: Lucilla Lame, MD;  Location: [place];  Service: Endoscopy;;  . POLYPECTOMY N/A 04/12/2019   Procedure: POLYPECTOMY;  Surgeon: Lucilla Lame, MD;  Location: [place];  Service: Endoscopy;  Laterality: N/A;  . VIDEO BRONCHOSCOPY WITH ENDOBRONCHIAL NAVIGATION N/A 11/21/2020   Procedure: VIDEO BRONCHOSCOPY WITH ENDOBRONCHIAL NAVIGATION;  Surgeon: Ottie Glazier, MD;  Location: [facility];  Service: Thoracic;  Laterality: N/A;  . VIDEO BRONCHOSCOPY WITH ENDOBRONCHIAL ULTRASOUND N/A 11/21/2020   Procedure: VIDEO BRONCHOSCOPY WITH ENDOBRONCHIAL ULTRASOUND;  Surgeon: Ottie Glazier, MD;  Location: [facility];  Service: Thoracic;  Laterality: N/A;    Social History   Socioeconomic History  . Marital status: Married    Spouse name: Not on file  . Number of children: Not on file  . Years of education: Not on file  . Highest education level: Not on file  Occupational History  . Not on file  Tobacco Use  . Smoking status: Former Smoker    Packs/day: 0.50    Years: 50.00    Pack years: 25.00    Types: Cigarettes    Quit date: 11/03/2020    Years since quitting: 0.1  . Smokeless tobacco: Never Used  Vaping Use  . Vaping Use: Some days  Substance and Sexual Activity  . Alcohol use: No  . Drug use: Never  . Sexual activity: Not on file  Other Topics Concern  . Not on file  Social History Narrative  . Not on file   Social Determinants of Health   Financial Resource Strain: Not on file  Food Insecurity: Not on file  Transportation Needs: Not on file  Physical Activity: Not on file  Stress: Not on file  Social Connections: Not on file  Intimate Partner Violence: Not on file    Family History  Problem Relation Age of Onset  . Alzheimer's  disease Mother   . Heart attack Mother   . High Cholesterol Mother   . Hypertension Mother   . Heart block Mother   . Lung cancer Father   . Uterine cancer Sister   . Hypertension Sister   . Heart block Sister   . Anxiety disorder Sister   . Breast cancer Neg Hx      Current Outpatient Medications:  .  amLODipine (NORVASC) 5 MG tablet, Take 5 mg by mouth at bedtime., Disp: , Rfl:  .  atorvastatin (LIPITOR) 10 MG tablet, TAKE 1 TABLET BY MOUTH DAILY (Patient taking differently: Take 10 mg by mouth at bedtime.), Disp: 90 tablet, Rfl: 0 .  levothyroxine (SYNTHROID, LEVOTHROID) 50 MCG tablet, Take 50 mcg by mouth daily before breakfast., Disp: , Rfl:  .  lidocaine-prilocaine (EMLA) cream, Apply to affected area once, Disp: 30 g, Rfl: 3 .  loratadine (CLARITIN) 10 MG tablet, Take 10 mg by mouth daily. Reported on 01/22/2016, Disp: , Rfl:  .  LORazepam (ATIVAN) 0.5 MG tablet, Take 1 tablet (0.5 mg total) by mouth every 6 (six) hours as needed for anxiety., Disp: 120 tablet, Rfl: 0 .  losartan-hydrochlorothiazide (HYZAAR) 100-25 MG tablet, Take 1 tablet by mouth daily., Disp: , Rfl:  .  metoprolol succinate (TOPROL-XL) 50 MG 24 hr tablet, Take 50 mg by mouth daily., Disp: , Rfl:  .  PARoxetine (PAXIL) 10 MG tablet, Take 5 mg by mouth every evening., Disp: , Rfl:  .  ondansetron (ZOFRAN) 8 MG tablet, Take 1 tablet (8 mg total) by mouth 2 (two) times daily as needed for refractory nausea / vomiting. Start on day 3 after carboplatin chemo. (Patient not taking: Reported on 12/22/2020), Disp: 30 tablet, Rfl: 1 .  prochlorperazine (COMPAZINE) 10 MG tablet, Take 1 tablet (10 mg total) by mouth every 6 (six) hours as needed (Nausea or vomiting). (Patient not taking: Reported on 12/22/2020), Disp: 30 tablet, Rfl: 1 No current facility-administered medications for this visit.  Facility-Administered Medications Ordered in Other Visits:  .  heparin lock flush 100 unit/mL, 500 Units, Intracatheter, Once PRN, Sindy Guadeloupe, MD  Physical exam:  Vitals:   12/22/20 0939  BP: (!) 152/62  Pulse: 72  Resp: 16  Temp: 98.5 F (36.9 C)  TempSrc: Oral  Weight: 144 lb 12.8 oz (65.7 kg)  Height: _0  (1.702 m)   Physical Exam Constitutional:      General: She is not in acute distress. Cardiovascular:     Rate and Rhythm: Normal rate and regular rhythm.     Heart sounds: Normal heart sounds.  Pulmonary:     Effort: Pulmonary effort is normal.     Breath sounds: Normal breath sounds.  Abdominal:     General: Bowel sounds are normal.     Palpations: Abdomen is soft.  Skin:    General: Skin is warm and dry.  Neurological:     Mental Status: She is alert and oriented to person, place, and time.      CMP Latest Ref Rng & Units 12/22/2020  Glucose 70 - 99 mg/dL 108(H)  BUN 8 - 23 mg/dL 10  Creatinine 0.44 - 1.00 mg/dL 0.78  Sodium 135 - 145 mmol/L 129(L)  Potassium 3.5 - 5.1 mmol/L 3.1(L)  Chloride 98 - 111 mmol/L 88(L)  CO2 22 - 32 mmol/L 28  Calcium 8.9 - 10.3 mg/dL 9.6  Total Protein 6.5 - 8.1 g/dL 6.9  Total Bilirubin 0.3 - 1.2 mg/dL 0.9  Alkaline Phos 38 - 126 U/L 65  AST 15 - 41 U/L 27  ALT 0 - 44 U/L 18   CBC Latest Ref Rng & Units 12/22/2020  WBC 4.0 - 10.5 K/uL 9.1  Hemoglobin 12.0 - 15.0 g/dL 12.2  Hematocrit 36.0 - 46.0 % 34.4(L)  Platelets 150 - 400 K/uL 246    No images are attached to the encounter.  NM PET Image Initial (PI) Skull Base To Thigh  Result Date: 11/28/2020 CLINICAL DATA:  Initial treatment strategy for LEFT lower lobe nodule status post bronchoscopic biopsy in a 75 year old female. EXAM: NUCLEAR MEDICINE PET SKULL BASE TO THIGH TECHNIQUE: 7.41 mCi F-18 FDG was injected intravenously. Full-ring PET imaging was performed from the skull base to thigh after the radiotracer. CT data was obtained and used for attenuation correction and anatomic localization. Fasting blood glucose: 85 mg/dl COMPARISON:  Prior chest CT dated November 19, 2020 FINDINGS: Mediastinal blood  pool activity: SUV max 2.37 Liver activity: SUV max NA NECK: No hypermetabolic  lymph nodes in the neck. Incidental CT findings: none CHEST: LEFT lung mass in the LEFT lower lobe measuring 3.0 x 2.0 cm on image 109 of series 3 with a maximum SUV of 9.7. Interval development of either bronchial dilation or cystic change since the prior study with small amount of surrounding stranding no mediastinal adenopathy. Basilar atelectasis. Incidental CT findings: Densely calcified atheromatous plaque in the thoracic aorta without aneurysmal dilation. Normal caliber central pulmonary vasculature. Normal heart size without substantial pericardial effusion. Signs of coronary artery calcification as before. Esophagus mildly patulous. ABDOMEN/PELVIS: No abnormal hypermetabolic activity within the liver, pancreas, adrenal glands, or spleen. No hypermetabolic lymph nodes in the abdomen or pelvis. Incidental CT findings: Liver, gallbladder, spleen, pancreas, adrenal glands and kidneys without acute process. Stomach and small bowel unremarkable on noncontrast CT. Normal appendix. Stool fills much of the colon. Aortic atherosclerosis without aneurysm. Post hysterectomy. SKELETON: RIGHT iliac with hypermetabolic focus on image 338 of series 3 with a maximum SUV of 11.1. Only subtle variable sclerosis without definable lesion in this location. Area measures approximately 2.5 cm greatest dimension no additional hypermetabolic focus in the visualized skeleton. Incidental CT findings: none IMPRESSION: LEFT lower lobe mass slightly larger than on recent imaging, potentially related to post biopsy changes or technical factors, with hypermetabolic features, suspicious for bronchogenic neoplasm. Small cystic focus adjacent to this mass increased bronchial dilation or post biopsy changes. Superimposed inflammation is also considered. Correlate with any respiratory symptoms. RIGHT iliac lesion suspicious for metastasis with marked hypermetabolic  activity. No additional signs of increased metabolic activity to suggest additional sites of disease. Aortic atherosclerosis. Calcified coronary artery disease. Aortic Atherosclerosis (ICD10-I70.0). Electronically Signed   By: Zetta Bills M.D.   On: 11/28/2020 17:08   CT Biopsy  Result Date: 12/18/2020 INDICATION: Recent diagnosis of lung cancer, now with hypermetabolic lesion involving the right ilium. Please performed CT-guided biopsy for tissue diagnostic purposes. EXAM: CT GUIDED BIOPSY OF HYPERMETABOLIC RIGHT ILIAC LESION. MEDICATIONS: None COMPARISON:  PET-CT-11/27/2020 ANESTHESIA/SEDATION: Fentanyl 50 mcg IV; Versed 1 mg IV Sedation time: 10 minutes; The patient was continuously monitored during the procedure by the interventional radiology nurse under my direct supervision. COMPLICATIONS: None immediate. PROCEDURE: Informed consent was obtained from the patient following an explanation of the procedure, risks, benefits and alternatives. The patient understands, agrees and consents for the procedure. All questions were addressed. A time out was performed prior to the initiation of the procedure. The patient was positioned prone on the CT table and a limited CT was performed for procedural planning demonstrating an ill-defined approximately 2.5 x 2.0 cm lytic lesion involving the right ilium adjacent to the anterior aspect of the right SI joint (image 18, series 2), correlating with the hypermetabolic lesion seen on preceding PET-CT image 213, series 603. The procedure was planned. The operative site was prepped and draped in the usual sterile fashion. Appropriate trajectory was confirmed with a 22 gauge spinal needle after the adjacent tissues were anesthetized with 1% Lidocaine with epinephrine. Next, an 11 gauge coaxial bone biopsy needle was advanced into the right ilium adjacent to the posterior aspect of the ill-defined lytic lesion. Needle position was confirmed with CT imaging (image 8, series  5). Initially, 2 bone biopsies were obtained with the inner 13 gauge biopsy device. Again, appropriate position was confirmed with CT imaging (image 8, series 6). Next, a final bone biopsy was obtained with the 11 gauge outer bone biopsy device. The needle was removed and superficial hemostasis was obtained with  manual compression. A dressing was applied. The patient tolerated the procedure well without immediate post procedural complication. IMPRESSION: Technically successful CT guided core biopsy of hypermetabolic right iliac bone lesion. Electronically Signed   By: Sandi Mariscal M.D.   On: 12/18/2020 11:07   CT BIOPSY  Result Date: 12/04/2020 INDICATION: 75 year old woman with FDG avid left lower lobe lung mass presents to interventional radiology for CT-guided biopsy. EXAM: CT-guided biopsy of left lower lobe mass MEDICATIONS: None. ANESTHESIA/SEDATION: Moderate (conscious) sedation was employed during this procedure. A total of Versed 1 mg and Fentanyl 50 mcg was administered intravenously. Moderate Sedation Time: 19 minutes. The patient's level of consciousness and vital signs were monitored continuously by radiology nursing throughout the procedure under my direct supervision. COMPLICATIONS: None immediate. PROCEDURE: Informed written consent was obtained from the patient after a thorough discussion of the procedural risks, benefits and alternatives. All questions were addressed. Maximal Sterile Barrier Technique was utilized including caps, mask, sterile gowns, sterile gloves, sterile drape, hand hygiene and skin antiseptic. A timeout was performed prior to the initiation of the procedure. Patient position supine on the CT table. Left lateral chest wall skin prepped and draped in usual sterile fashion. Following local lidocaine administration, 17 gauge introducer needle was advanced into the left lower lobe lung mass utilizing CT guidance. Three cores were obtained from the left lower lobe lung mass and  sent to pathology in formalin. Needle removed and hemostasis achieved with manual compression. IMPRESSION: CT-guided biopsy of left lower lobe lung mass as above. Electronically Signed   By: Miachel Roux M.D.   On: 12/04/2020 14:10   DG Chest Port 1 View  Result Date: 12/04/2020 CLINICAL DATA:  75 year old woman status post left lower lobe lung mass biopsy. EXAM: PORTABLE CHEST 1 VIEW COMPARISON:  Chest radiograph 06/05/2013 FINDINGS: Cardiomediastinal silhouette and pulmonary vasculature are within normal limits. Left lower lobe lung mass again seen. Haziness around the mass likely due to prior lesional hemorrhage. No pneumothorax identified. Atherosclerotic changes of the thoracic aorta again seen. IMPRESSION: No pneumothorax status post left lower lobe lung mass biopsy. Electronically Signed   By: Miachel Roux M.D.   On: 12/04/2020 14:12   IR IMAGING GUIDED PORT INSERTION  Result Date: 12/18/2020 INDICATION: Poor venous access. In need of durable intravenous access for chemotherapy administration. EXAM: IMPLANTED PORT A CATH PLACEMENT WITH ULTRASOUND AND FLUOROSCOPIC GUIDANCE COMPARISON:  PET-CT-11/27/2020 MEDICATIONS: None ANESTHESIA/SEDATION: Moderate (conscious) sedation was employed during this procedure. A total of Versed 1 mg and Fentanyl 50 mcg was administered intravenously. Moderate Sedation Time: 22 minutes. The patient's level of consciousness and vital signs were monitored continuously by radiology nursing throughout the procedure under my direct supervision. CONTRAST:  None FLUOROSCOPY TIME:  1 minute (3.6 mGy) COMPLICATIONS: None immediate. PROCEDURE: The procedure, risks, benefits, and alternatives were explained to the patient. Questions regarding the procedure were encouraged and answered. The patient understands and consents to the procedure. The right neck and chest were prepped with chlorhexidine in a sterile fashion, and a sterile drape was applied covering the operative field.  Maximum barrier sterile technique with sterile gowns and gloves were used for the procedure. A timeout was performed prior to the initiation of the procedure. Local anesthesia was provided with 1% lidocaine with epinephrine. After creating a small venotomy incision, a micropuncture kit was utilized to access the internal jugular vein. Real-time ultrasound guidance was utilized for vascular access including the acquisition of a permanent ultrasound image documenting patency of the accessed vessel. The  microwire was utilized to measure appropriate catheter length. A subcutaneous port pocket was then created along the upper chest wall utilizing a combination of sharp and blunt dissection. The pocket was irrigated with sterile saline. A single lumen "standard sized" power injectable port was chosen for placement. The 8 Fr catheter was tunneled from the port pocket site to the venotomy incision. The port was placed in the pocket. The external catheter was trimmed to appropriate length. At the venotomy, an 8 Fr peel-away sheath was placed over a guidewire under fluoroscopic guidance. The catheter was then placed through the sheath and the sheath was removed. Final catheter positioning was confirmed and documented with a fluoroscopic spot radiograph. The port was accessed with a Huber needle, aspirated and flushed with heparinized saline. The venotomy site was closed with an interrupted 4-0 Vicryl suture. The port pocket incision was closed with interrupted 2-0 Vicryl suture. Dermabond and Steri-strips were applied to both incisions. Dressings were applied. The patient tolerated the procedure well without immediate post procedural complication. FINDINGS: After catheter placement, the tip lies within the superior cavoatrial junction. The catheter aspirates and flushes normally and is ready for immediate use. IMPRESSION: Successful placement of a right internal jugular approach power injectable Port-A-Cath. The catheter is  ready for immediate use. Electronically Signed   By: Sandi Mariscal M.D.   On: 12/18/2020 14:14     Assessment and plan- Patient is a 75 y.o. female with newly diagnosed extensive stage small cell lung cancer with isolated bone metastases to the right iliac bone.  She is here for on treatment assessment prior to cycle 1 of carbo etoposide Tecentriq chemotherapy  Discussed the results of the bone biopsy with the patient which is consistent with small cell lung cancer.  This unfortunately to stage IV disease and surgery would not be recommended at this time.  Plan to proceed with cycle 1 of carbo etoposide Tecentriq chemotherapy today as planned with etoposide tomorrow and day after.  Patient will receive 1 for Neulasta on day 3.  Discussed risks and benefits of chemotherapy including all but not limited to nausea, vomiting, low blood counts, risk of infections and hospitalizations.  Risk of peripheral neuropathy associated with carboplatin.  Patient understands and agrees to proceed as planned.  Hyponatremia: Will receive 1 L of IV fluids today along with 20 mEq of IV potassium.  Repeat labs and see me in 10 days for possible fluids  I will also see her back in 3 weeks for cycle 2.  Plan to repeat scans after 2 cycles.  Bone metastases: Discussed the role for bisphosphonates to reduce the risk of future fractures.  She will ideally need dental clearance given the risk of osteonecrosis of the jaw associated with bisphosphonates.  We will tentatively plan to give her Zometa or Delton See based on what her insurance approves after getting dental clearance.  Cancer Staging Small cell lung cancer in adult [facility]) Staging form: Lung, AJCC 8th Edition - Clinical stage from 12/22/2020: Stage IV (cT2, cN0, cM1) - Signed by Sindy Guadeloupe, MD on 12/22/2020     Visit Diagnosis 1. Small cell lung cancer in adult [facility])   2. Encounter for antineoplastic chemotherapy   3. Encounter for antineoplastic immunotherapy       Dr. Randa Evens, MD, MPH [facility] at [facility] [phone] 12/22/2020 4:36 PM

## 2020-12-22 NOTE — Patient Instructions (Signed)
[facility]  Discharge Instructions: Thank you for choosing [facility] to provide your oncology and hematology care.  If you have a lab appointment with the [facility], please go directly to the [facility] and check in at the registration area.  Wear comfortable clothing and clothing appropriate for easy access to any Portacath or PICC line.    Carboplatin injection What is this medicine? CARBOPLATIN (KAR boe pla tin) is a chemotherapy drug. It targets fast dividing cells, like cancer cells, and causes these cells to die. This medicine is used to treat ovarian cancer and many other cancers. This medicine may be used for other purposes; ask your health care provider or pharmacist if you have questions. COMMON BRAND NAME(S): Paraplatin What should I tell my health care provider before I take this medicine? They need to know if you have any of these conditions:  blood disorders  hearing problems  kidney disease  recent or ongoing radiation therapy  an unusual or allergic reaction to carboplatin, cisplatin, other chemotherapy, other medicines, foods, dyes, or preservatives  pregnant or trying to get pregnant  breast-feeding How should I use this medicine? This drug is usually given as an infusion into a vein. It is administered in a hospital or clinic by a specially trained health care professional. Talk to your pediatrician regarding the use of this medicine in children. Special care may be needed. Overdosage: If you think you have taken too much of this medicine contact a poison control center or emergency room at once. NOTE: This medicine is only for you. Do not share this medicine with others. What if I miss a dose? It is important not to miss a dose. Call your doctor or health care professional if you are unable to keep an appointment. What may interact with this medicine?  medicines for seizures  medicines to increase  blood counts like filgrastim, pegfilgrastim, sargramostim  some antibiotics like amikacin, gentamicin, neomycin, streptomycin, tobramycin  vaccines Talk to your doctor or health care professional before taking any of these medicines:  acetaminophen  aspirin  ibuprofen  ketoprofen  naproxen This list may not describe all possible interactions. Give your health care provider a list of all the medicines, herbs, non-prescription drugs, or dietary supplements you use. Also tell them if you smoke, drink alcohol, or use illegal drugs. Some items may interact with your medicine. What should I watch for while using this medicine? Your condition will be monitored carefully while you are receiving this medicine. You will need important blood work done while you are taking this medicine. This drug may make you feel generally unwell. This is not uncommon, as chemotherapy can affect healthy cells as well as cancer cells. Report any side effects. Continue your course of treatment even though you feel ill unless your doctor tells you to stop. In some cases, you may be given additional medicines to help with side effects. Follow all directions for their use. Call your doctor or health care professional for advice if you get a fever, chills or sore throat, or other symptoms of a cold or flu. Do not treat yourself. This drug decreases your body's ability to fight infections. Try to avoid being around people who are sick. This medicine may increase your risk to bruise or bleed. Call your doctor or health care professional if you notice any unusual bleeding. Be careful brushing and flossing your teeth or using a toothpick because you may get an infection or  bleed more easily. If you have any dental work done, tell your dentist you are receiving this medicine. Avoid taking products that contain aspirin, acetaminophen, ibuprofen, naproxen, or ketoprofen unless instructed by your doctor. These medicines may hide a  fever. Do not become pregnant while taking this medicine. Women should inform their doctor if they wish to become pregnant or think they might be pregnant. There is a potential for serious side effects to an unborn child. Talk to your health care professional or pharmacist for more information. Do not breast-feed an infant while taking this medicine. What side effects may I notice from receiving this medicine? Side effects that you should report to your doctor or health care professional as soon as possible:  allergic reactions like skin rash, itching or hives, swelling of the face, lips, or tongue  signs of infection - fever or chills, cough, sore throat, pain or difficulty passing urine  signs of decreased platelets or bleeding - bruising, pinpoint red spots on the skin, black, tarry stools, nosebleeds  signs of decreased red blood cells - unusually weak or tired, fainting spells, lightheadedness  breathing problems  changes in hearing  changes in vision  chest pain  high blood pressure  low blood counts - This drug may decrease the number of white blood cells, red blood cells and platelets. You may be at increased risk for infections and bleeding.  nausea and vomiting  pain, swelling, redness or irritation at the injection site  pain, tingling, numbness in the hands or feet  problems with balance, talking, walking  trouble passing urine or change in the amount of urine Side effects that usually do not require medical attention (report to your doctor or health care professional if they continue or are bothersome):  hair loss  loss of appetite  metallic taste in the mouth or changes in taste This list may not describe all possible side effects. Call your doctor for medical advice about side effects. You may report side effects to FDA at 1-800-FDA-1088. Where should I keep my medicine? This drug is given in a hospital or clinic and will not be stored at home. NOTE: This  sheet is a summary. It may not cover all possible information. If you have questions about this medicine, talk to your doctor, pharmacist, or health care provider.  2021 Elsevier/Gold Standard (2007-11-14 14:38:05)                                                    Etoposide, VP-16 injection What is this medicine? ETOPOSIDE, VP-16 (e toe POE side) is a chemotherapy drug. It is used to treat testicular cancer, lung cancer, and other cancers. This medicine may be used for other purposes; ask your health care provider or pharmacist if you have questions. COMMON BRAND NAME(S): Etopophos, Toposar, VePesid What should I tell my health care provider before I take this medicine? They need to know if you have any of these conditions:  infection  kidney disease  liver disease  low blood counts, like low white cell, platelet, or red cell counts  an unusual or allergic reaction to etoposide, other medicines, foods, dyes, or preservatives  pregnant or trying to get pregnant  breast-feeding How should I use this medicine? This medicine is for infusion into a vein. It is administered in a hospital or clinic by  a specially trained health care professional. Talk to your pediatrician regarding the use of this medicine in children. Special care may be needed. Overdosage: If you think you have taken too much of this medicine contact a poison control center or emergency room at once. NOTE: This medicine is only for you. Do not share this medicine with others. What if I miss a dose? It is important not to miss your dose. Call your doctor or health care professional if you are unable to keep an appointment. What may interact with this medicine? This medicine may interact with the following medications:  warfarin This list may not describe all possible interactions. Give your health care provider a list of all the medicines, herbs, non-prescription drugs, or dietary supplements you use. Also tell them if  you smoke, drink alcohol, or use illegal drugs. Some items may interact with your medicine. What should I watch for while using this medicine? Visit your doctor for checks on your progress. This drug may make you feel generally unwell. This is not uncommon, as chemotherapy can affect healthy cells as well as cancer cells. Report any side effects. Continue your course of treatment even though you feel ill unless your doctor tells you to stop. In some cases, you may be given additional medicines to help with side effects. Follow all directions for their use. Call your doctor or health care professional for advice if you get a fever, chills or sore throat, or other symptoms of a cold or flu. Do not treat yourself. This drug decreases your body's ability to fight infections. Try to avoid being around people who are sick. This medicine may increase your risk to bruise or bleed. Call your doctor or health care professional if you notice any unusual bleeding. Talk to your doctor about your risk of cancer. You may be more at risk for certain types of cancers if you take this medicine. Do not become pregnant while taking this medicine or for at least 6 months after stopping it. Women should inform their doctor if they wish to become pregnant or think they might be pregnant. Women of child-bearing potential will need to have a negative pregnancy test before starting this medicine. There is a potential for serious side effects to an unborn child. Talk to your health care professional or pharmacist for more information. Do not breast-feed an infant while taking this medicine. Men must use a latex condom during sexual contact with a woman while taking this medicine and for at least 4 months after stopping it. A latex condom is needed even if you have had a vasectomy. Contact your doctor right away if your partner becomes pregnant. Do not donate sperm while taking this medicine and for at least 4 months after you stop  taking this medicine. Men should inform their doctors if they wish to father a child. This medicine may lower sperm counts. What side effects may I notice from receiving this medicine? Side effects that you should report to your doctor or health care professional as soon as possible:  allergic reactions like skin rash, itching or hives, swelling of the face, lips, or tongue  low blood counts - this medicine may decrease the number of white blood cells, red blood cells, and platelets. You may be at increased risk for infections and bleeding  nausea, vomiting  redness, blistering, peeling or loosening of the skin, including inside the mouth  signs and symptoms of infection like fever; chills; cough; sore throat; pain or trouble  passing urine  signs and symptoms of low red blood cells or anemia such as unusually weak or tired; feeling faint or lightheaded; falls; breathing problems  unusual bruising or bleeding Side effects that usually do not require medical attention (report to your doctor or health care professional if they continue or are bothersome):  changes in taste  diarrhea  hair loss  loss of appetite  mouth sores This list may not describe all possible side effects. Call your doctor for medical advice about side effects. You may report side effects to FDA at 1-800-FDA-1088. Where should I keep my medicine? This drug is given in a hospital or clinic and will not be stored at home. NOTE: This sheet is a summary. It may not cover all possible information. If you have questions about this medicine, talk to your doctor, pharmacist, or health care provider.  2021 Elsevier/Gold Standard (2018-10-04 16:57:15)  Atezolizumab injection What is this medicine? ATEZOLIZUMAB (a te zoe LIZ ue mab) is a monoclonal antibody. It is used to treat bladder cancer (urothelial cancer), liver cancer, lung cancer, and melanoma. This medicine may be used for other purposes; ask your health care  provider or pharmacist if you have questions. COMMON BRAND NAME(S): Tecentriq What should I tell my health care provider before I take this medicine? They need to know if you have any of these conditions:  autoimmune diseases like Crohn's disease, ulcerative colitis, or lupus  have had or planning to have an allogeneic stem cell transplant (uses someone else's stem cells)  history of organ transplant  history of radiation to the chest  nervous system problems like myasthenia gravis or Guillain-Barre syndrome  an unusual or allergic reaction to atezolizumab, other medicines, foods, dyes, or preservatives  pregnant or trying to get pregnant  breast-feeding How should I use this medicine? This medicine is for infusion into a vein. It is given by a health care professional in a hospital or clinic setting. A special MedGuide will be given to you before each treatment. Be sure to read this information carefully each time. Talk to your pediatrician regarding the use of this medicine in children. Special care may be needed. Overdosage: If you think you have taken too much of this medicine contact a poison control center or emergency room at once. NOTE: This medicine is only for you. Do not share this medicine with others. What if I miss a dose? It is important not to miss your dose. Call your doctor or health care professional if you are unable to keep an appointment. What may interact with this medicine? Interactions have not been studied. This list may not describe all possible interactions. Give your health care provider a list of all the medicines, herbs, non-prescription drugs, or dietary supplements you use. Also tell them if you smoke, drink alcohol, or use illegal drugs. Some items may interact with your medicine. What should I watch for while using this medicine? Your condition will be monitored carefully while you are receiving this medicine. You may need blood work done while you  are taking this medicine. Do not become pregnant while taking this medicine or for at least 5 months after stopping it. Women should inform their doctor if they wish to become pregnant or think they might be pregnant. There is a potential for serious side effects to an unborn child. Talk to your health care professional or pharmacist for more information. Do not breast-feed an infant while taking this medicine or for at least 5 months  after the last dose. What side effects may I notice from receiving this medicine? Side effects that you should report to your doctor or health care professional as soon as possible:  allergic reactions like skin rash, itching or hives, swelling of the face, lips, or tongue  black, tarry stools  bloody or watery diarrhea  breathing problems  changes in vision  chest pain or chest tightness  chills  facial flushing  fever  headache  signs and symptoms of high blood sugar such as dizziness; dry mouth; dry skin; fruity breath; nausea; stomach pain; increased hunger or thirst; increased urination  signs and symptoms of liver injury like dark yellow or brown urine; general ill feeling or flu-like symptoms; light-colored stools; loss of appetite; nausea; right upper belly pain; unusually weak or tired; yellowing of the eyes or skin  stomach pain  trouble passing urine or change in the amount of urine Side effects that usually do not require medical attention (report to your doctor or health care professional if they continue or are bothersome):  bone pain  cough  diarrhea  joint pain  muscle pain  muscle weakness  swelling of arms or legs  tiredness  weight loss This list may not describe all possible side effects. Call your doctor for medical advice about side effects. You may report side effects to FDA at 1-800-FDA-1088. Where should I keep my medicine? This drug is given in a hospital or clinic and will not be stored at home. NOTE: This  sheet is a summary. It may not cover all possible information. If you have questions about this medicine, talk to your doctor, pharmacist, or health care provider.  2021 Elsevier/Gold Standard (2020-05-08 13:59:34)   We strive to give you quality time with your provider. You may need to reschedule your appointment if you arrive late (15 or more minutes).  Arriving late affects you and other patients whose appointments are after yours.  Also, if you miss three or more appointments without notifying the office, you may be dismissed from the clinic at the provider's discretion.      For prescription refill requests, have your pharmacy contact our office and allow 72 hours for refills to be completed.    Today you received the following chemotherapy and/or immunotherapy agents: Tecentriq, Etoposide, Carboplatin   To help prevent nausea and vomiting after your treatment, we encourage you to take your nausea medication as directed.  BELOW ARE SYMPTOMS THAT SHOULD BE REPORTED IMMEDIATELY: . *FEVER GREATER THAN 100.4 F (38 C) OR HIGHER . *CHILLS OR SWEATING . *NAUSEA AND VOMITING THAT IS NOT CONTROLLED WITH YOUR NAUSEA MEDICATION . *UNUSUAL SHORTNESS OF BREATH . *UNUSUAL BRUISING OR BLEEDING . *URINARY PROBLEMS (pain or burning when urinating, or frequent urination) . *BOWEL PROBLEMS (unusual diarrhea, constipation, pain near the anus) . TENDERNESS IN MOUTH AND THROAT WITH OR WITHOUT PRESENCE OF ULCERS (sore throat, sores in mouth, or a toothache) . UNUSUAL RASH, SWELLING OR PAIN  . UNUSUAL VAGINAL DISCHARGE OR ITCHING   Items with * indicate a potential emergency and should be followed up as soon as possible or go to the Emergency Department if any problems should occur.  Please show the CHEMOTHERAPY ALERT CARD or IMMUNOTHERAPY ALERT CARD at check-in to the Emergency Department and triage nurse.  Should you have questions after your visit or need to cancel or reschedule your appointment,  please contact [facility]  [phone] and follow the prompts.  Office hours are 8:00  a.m. to 4:30 p.m. Monday - Friday. Please note that voicemails left after 4:00 p.m. may not be returned until the following business day.  We are closed weekends and major holidays. You have access to a nurse at all times for urgent questions. Please call the main number to the clinic [phone] and follow the prompts.  For any non-urgent questions, you may also contact your provider using MyChart. We now offer e-Visits for anyone 68 and older to request care online for non-urgent symptoms. For details visit [email or web address].   Also download the MyChart app! Go to the app store, search "MyChart", open the app, select [facility], and log in with your MyChart username and password.  Due to Covid, a mask is required upon entering the hospital/clinic. If you do not have a mask, one will be given to you upon arrival. For doctor visits, patients may have 1 support person aged 63 or older with them. For treatment visits, patients cannot have anyone with them due to current Covid guidelines and our immunocompromised population.

## 2020-12-23 ENCOUNTER — Other Ambulatory Visit: Payer: Self-pay

## 2020-12-23 ENCOUNTER — Telehealth: Payer: Self-pay

## 2020-12-23 ENCOUNTER — Inpatient Hospital Stay: Payer: Medicare Other

## 2020-12-23 ENCOUNTER — Encounter: Payer: Self-pay | Admitting: *Deleted

## 2020-12-23 DIAGNOSIS — Z510 Encounter for antineoplastic radiation therapy: Secondary | ICD-10-CM | POA: Diagnosis not present

## 2020-12-23 DIAGNOSIS — C3490 Malignant neoplasm of unspecified part of unspecified bronchus or lung: Secondary | ICD-10-CM

## 2020-12-23 LAB — T4: T4, Total: 8.8 ug/dL (ref 4.5–12.0)

## 2020-12-23 MED ORDER — HEPARIN SOD (PORK) LOCK FLUSH 100 UNIT/ML IV SOLN
500.0000 [IU] | Freq: Once | INTRAVENOUS | Status: AC | PRN
  Administered 2020-12-23: 500 [IU]
  Filled 2020-12-23: qty 5

## 2020-12-23 MED ORDER — SODIUM CHLORIDE 0.9% FLUSH
10.0000 mL | INTRAVENOUS | Status: DC | PRN
  Filled 2020-12-23: qty 10

## 2020-12-23 MED ORDER — HEPARIN SOD (PORK) LOCK FLUSH 100 UNIT/ML IV SOLN
INTRAVENOUS | Status: AC
  Filled 2020-12-23: qty 5

## 2020-12-23 MED ORDER — SODIUM CHLORIDE 0.9 % IV SOLN
10.0000 mg | Freq: Once | INTRAVENOUS | Status: AC
  Administered 2020-12-23: 10 mg via INTRAVENOUS
  Filled 2020-12-23: qty 10

## 2020-12-23 MED ORDER — SODIUM CHLORIDE 0.9 % IV SOLN
Freq: Once | INTRAVENOUS | Status: AC
  Filled 2020-12-23: qty 250

## 2020-12-23 MED ORDER — SODIUM CHLORIDE 0.9 % IV SOLN
100.0000 mg/m2 | Freq: Once | INTRAVENOUS | Status: AC
  Administered 2020-12-23: 180 mg via INTRAVENOUS
  Filled 2020-12-23: qty 9

## 2020-12-23 NOTE — Patient Instructions (Signed)
[facility]  Discharge Instructions: Thank you for choosing [facility] to provide your oncology and hematology care.  If you have a lab appointment with the [facility], please go directly to the [facility] and check in at the registration area.  Wear comfortable clothing and clothing appropriate for easy access to any Portacath or PICC line.   We strive to give you quality time with your provider. You may need to reschedule your appointment if you arrive late (15 or more minutes).  Arriving late affects you and other patients whose appointments are after yours.  Also, if you miss three or more appointments without notifying the office, you may be dismissed from the clinic at the provider's discretion.      For prescription refill requests, have your pharmacy contact our office and allow 72 hours for refills to be completed.    Today you received the following chemotherapy and/or immunotherapy agents - etoposide      To help prevent nausea and vomiting after your treatment, we encourage you to take your nausea medication as directed.  BELOW ARE SYMPTOMS THAT SHOULD BE REPORTED IMMEDIATELY: . *FEVER GREATER THAN 100.4 F (38 C) OR HIGHER . *CHILLS OR SWEATING . *NAUSEA AND VOMITING THAT IS NOT CONTROLLED WITH YOUR NAUSEA MEDICATION . *UNUSUAL SHORTNESS OF BREATH . *UNUSUAL BRUISING OR BLEEDING . *URINARY PROBLEMS (pain or burning when urinating, or frequent urination) . *BOWEL PROBLEMS (unusual diarrhea, constipation, pain near the anus) . TENDERNESS IN MOUTH AND THROAT WITH OR WITHOUT PRESENCE OF ULCERS (sore throat, sores in mouth, or a toothache) . UNUSUAL RASH, SWELLING OR PAIN  . UNUSUAL VAGINAL DISCHARGE OR ITCHING   Items with * indicate a potential emergency and should be followed up as soon as possible or go to the Emergency Department if any problems should occur.  Please show the CHEMOTHERAPY ALERT CARD or IMMUNOTHERAPY  ALERT CARD at check-in to the Emergency Department and triage nurse.  Should you have questions after your visit or need to cancel or reschedule your appointment, please contact [facility]  [phone] and follow the prompts.  Office hours are 8:00 a.m. to 4:30 p.m. Monday - Friday. Please note that voicemails left after 4:00 p.m. may not be returned until the following business day.  We are closed weekends and major holidays. You have access to a nurse at all times for urgent questions. Please call the main number to the clinic [phone] and follow the prompts.  For any non-urgent questions, you may also contact your provider using MyChart. We now offer e-Visits for anyone 14 and older to request care online for non-urgent symptoms. For details visit [email or web address].   Also download the MyChart app! Go to the app store, search "MyChart", open the app, select , and log in with your MyChart username and password.  Due to Covid, a mask is required upon entering the hospital/clinic. If you do not have a mask, one will be given to you upon arrival. For doctor visits, patients may have 1 support person aged 23 or older with them. For treatment visits, patients cannot have anyone with them due to current Covid guidelines and our immunocompromised population.   Etoposide, VP-16 injection What is this medicine? ETOPOSIDE, VP-16 (e toe POE side) is a chemotherapy drug. It is used to treat testicular cancer, lung cancer, and other cancers. This medicine may be used for other purposes; ask your health care provider or pharmacist  if you have questions. COMMON BRAND NAME(S): Etopophos, Toposar, VePesid What should I tell my health care provider before I take this medicine? They need to know if you have any of these conditions:  infection  kidney disease  liver disease  low blood counts, like low white cell, platelet, or red cell counts  an  unusual or allergic reaction to etoposide, other medicines, foods, dyes, or preservatives  pregnant or trying to get pregnant  breast-feeding How should I use this medicine? This medicine is for infusion into a vein. It is administered in a hospital or clinic by a specially trained health care professional. Talk to your pediatrician regarding the use of this medicine in children. Special care may be needed. Overdosage: If you think you have taken too much of this medicine contact a poison control center or emergency room at once. NOTE: This medicine is only for you. Do not share this medicine with others. What if I miss a dose? It is important not to miss your dose. Call your doctor or health care professional if you are unable to keep an appointment. What may interact with this medicine? This medicine may interact with the following medications:  warfarin This list may not describe all possible interactions. Give your health care provider a list of all the medicines, herbs, non-prescription drugs, or dietary supplements you use. Also tell them if you smoke, drink alcohol, or use illegal drugs. Some items may interact with your medicine. What should I watch for while using this medicine? Visit your doctor for checks on your progress. This drug may make you feel generally unwell. This is not uncommon, as chemotherapy can affect healthy cells as well as cancer cells. Report any side effects. Continue your course of treatment even though you feel ill unless your doctor tells you to stop. In some cases, you may be given additional medicines to help with side effects. Follow all directions for their use. Call your doctor or health care professional for advice if you get a fever, chills or sore throat, or other symptoms of a cold or flu. Do not treat yourself. This drug decreases your body's ability to fight infections. Try to avoid being around people who are sick. This medicine may increase your  risk to bruise or bleed. Call your doctor or health care professional if you notice any unusual bleeding. Talk to your doctor about your risk of cancer. You may be more at risk for certain types of cancers if you take this medicine. Do not become pregnant while taking this medicine or for at least 6 months after stopping it. Women should inform their doctor if they wish to become pregnant or think they might be pregnant. Women of child-bearing potential will need to have a negative pregnancy test before starting this medicine. There is a potential for serious side effects to an unborn child. Talk to your health care professional or pharmacist for more information. Do not breast-feed an infant while taking this medicine. Men must use a latex condom during sexual contact with a woman while taking this medicine and for at least 4 months after stopping it. A latex condom is needed even if you have had a vasectomy. Contact your doctor right away if your partner becomes pregnant. Do not donate sperm while taking this medicine and for at least 4 months after you stop taking this medicine. Men should inform their doctors if they wish to father a child. This medicine may lower sperm counts. What side effects  may I notice from receiving this medicine? Side effects that you should report to your doctor or health care professional as soon as possible:  allergic reactions like skin rash, itching or hives, swelling of the face, lips, or tongue  low blood counts - this medicine may decrease the number of white blood cells, red blood cells, and platelets. You may be at increased risk for infections and bleeding  nausea, vomiting  redness, blistering, peeling or loosening of the skin, including inside the mouth  signs and symptoms of infection like fever; chills; cough; sore throat; pain or trouble passing urine  signs and symptoms of low red blood cells or anemia such as unusually weak or tired; feeling faint or  lightheaded; falls; breathing problems  unusual bruising or bleeding Side effects that usually do not require medical attention (report to your doctor or health care professional if they continue or are bothersome):  changes in taste  diarrhea  hair loss  loss of appetite  mouth sores This list may not describe all possible side effects. Call your doctor for medical advice about side effects. You may report side effects to FDA at 1-800-FDA-1088. Where should I keep my medicine? This drug is given in a hospital or clinic and will not be stored at home. NOTE: This sheet is a summary. It may not cover all possible information. If you have questions about this medicine, talk to your doctor, pharmacist, or health care provider.  2021 Elsevier/Gold Standard (2018-10-04 16:57:15)

## 2020-12-23 NOTE — Telephone Encounter (Signed)
Telephone call to patient for follow up after receiving first infusion.   No answer but left message stating we were calling to check on them.  Encouraged patient to call for any questions or concerns.   

## 2020-12-24 ENCOUNTER — Ambulatory Visit
Admission: RE | Admit: 2020-12-24 | Discharge: 2020-12-24 | Disposition: A | Discharge status: Home / Self Care | Payer: Medicare Other | Source: Ambulatory Visit | Attending: Oncology | Admitting: Oncology

## 2020-12-24 ENCOUNTER — Other Ambulatory Visit: Payer: Self-pay

## 2020-12-24 ENCOUNTER — Inpatient Hospital Stay: Payer: Medicare Other

## 2020-12-24 ENCOUNTER — Encounter: Payer: Self-pay | Admitting: *Deleted

## 2020-12-24 VITALS — BP 146/73 | HR 80 | Temp 98.9°F | Resp 16

## 2020-12-24 DIAGNOSIS — Z510 Encounter for antineoplastic radiation therapy: Secondary | ICD-10-CM | POA: Diagnosis not present

## 2020-12-24 DIAGNOSIS — C3490 Malignant neoplasm of unspecified part of unspecified bronchus or lung: Secondary | ICD-10-CM | POA: Diagnosis present

## 2020-12-24 LAB — HEPATITIS B SURFACE ANTIBODY, QUANTITATIVE: Hep B S AB Quant (Post): <3.1 m[IU]/mL — ABNORMAL LOW (ref >9.9)

## 2020-12-24 MED ORDER — SODIUM CHLORIDE 0.9 % IV SOLN
Freq: Once | INTRAVENOUS | Status: AC
  Filled 2020-12-24: qty 250

## 2020-12-24 MED ORDER — HEPARIN SOD (PORK) LOCK FLUSH 100 UNIT/ML IV SOLN
500.0000 [IU] | Freq: Once | INTRAVENOUS | Status: AC | PRN
  Administered 2020-12-24: 500 [IU]
  Filled 2020-12-24: qty 5

## 2020-12-24 MED ORDER — DEXAMETHASONE SODIUM PHOSPHATE 100 MG/10ML IJ SOLN
10.0000 mg | Freq: Once | INTRAMUSCULAR | Status: AC
  Administered 2020-12-24: 10 mg via INTRAVENOUS
  Filled 2020-12-24: qty 10

## 2020-12-24 MED ORDER — HEPARIN SOD (PORK) LOCK FLUSH 100 UNIT/ML IV SOLN
INTRAVENOUS | Status: AC
  Filled 2020-12-24: qty 5

## 2020-12-24 MED ORDER — SODIUM CHLORIDE 0.9 % IV SOLN
100.0000 mg/m2 | Freq: Once | INTRAVENOUS | Status: AC
  Administered 2020-12-24: 180 mg via INTRAVENOUS
  Filled 2020-12-24: qty 9

## 2020-12-24 MED ORDER — GADOBUTROL 1 MMOL/ML IV SOLN
6.0000 mL | Freq: Once | INTRAVENOUS | Status: AC | PRN
  Administered 2020-12-24: 6 mL via INTRAVENOUS

## 2020-12-24 MED ORDER — PEGFILGRASTIM 6 MG/0.6ML ~~LOC~~ PSKT
6.0000 mg | PREFILLED_SYRINGE | Freq: Once | SUBCUTANEOUS | Status: AC
  Administered 2020-12-24: 6 mg via SUBCUTANEOUS
  Filled 2020-12-24: qty 0.6

## 2020-12-24 MED ORDER — SODIUM CHLORIDE 0.9% FLUSH
10.0000 mL | INTRAVENOUS | Status: DC | PRN
  Administered 2020-12-24: 10 mL
  Filled 2020-12-24: qty 10

## 2020-12-24 NOTE — Patient Instructions (Signed)
[facility]  Discharge Instructions: Thank you for choosing [facility] to provide your oncology and hematology care.  If you have a lab appointment with the [facility], please go directly to the [facility] and check in at the registration area.  Wear comfortable clothing and clothing appropriate for easy access to any Portacath or PICC line.   We strive to give you quality time with your provider. You may need to reschedule your appointment if you arrive late (15 or more minutes).  Arriving late affects you and other patients whose appointments are after yours.  Also, if you miss three or more appointments without notifying the office, you may be dismissed from the clinic at the provider's discretion.      For prescription refill requests, have your pharmacy contact our office and allow 72 hours for refills to be completed.    Today you received the following chemotherapy and/or immunotherapy agents Etoposide  Pegfilgrastim injection What is this medicine? PEGFILGRASTIM (PEG fil gra stim) is a long-acting granulocyte colony-stimulating factor that stimulates the growth of neutrophils, a type of white blood cell important in the body's fight against infection. It is used to reduce the incidence of fever and infection in patients with certain types of cancer who are receiving chemotherapy that affects the bone marrow, and to increase survival after being exposed to high doses of radiation. This medicine may be used for other purposes; ask your health care provider or pharmacist if you have questions. COMMON BRAND NAME(S): Rexene Edison, Ziextenzo What should I tell my health care provider before I take this medicine? They need to know if you have any of these conditions:  kidney disease  latex allergy  ongoing radiation therapy  sickle cell disease  skin reactions to acrylic adhesives (On-Body Injector  only)  an unusual or allergic reaction to pegfilgrastim, filgrastim, other medicines, foods, dyes, or preservatives  pregnant or trying to get pregnant  breast-feeding How should I use this medicine? This medicine is for injection under the skin. If you get this medicine at home, you will be taught how to prepare and give the pre-filled syringe or how to use the On-body Injector. Refer to the patient Instructions for Use for detailed instructions. Use exactly as directed. Tell your healthcare provider immediately if you suspect that the On-body Injector may not have performed as intended or if you suspect the use of the On-body Injector resulted in a missed or partial dose. It is important that you put your used needles and syringes in a special sharps container. Do not put them in a trash can. If you do not have a sharps container, call your pharmacist or healthcare provider to get one. Talk to your pediatrician regarding the use of this medicine in children. While this drug may be prescribed for selected conditions, precautions do apply. Overdosage: If you think you have taken too much of this medicine contact a poison control center or emergency room at once. NOTE: This medicine is only for you. Do not share this medicine with others. What if I miss a dose? It is important not to miss your dose. Call your doctor or health care professional if you miss your dose. If you miss a dose due to an On-body Injector failure or leakage, a new dose should be administered as soon as possible using a single prefilled syringe for manual use. What may interact with this medicine? Interactions have not been studied. This  list may not describe all possible interactions. Give your health care provider a list of all the medicines, herbs, non-prescription drugs, or dietary supplements you use. Also tell them if you smoke, drink alcohol, or use illegal drugs. Some items may interact with your medicine. What should I  watch for while using this medicine? Your condition will be monitored carefully while you are receiving this medicine. You may need blood work done while you are taking this medicine. Talk to your health care provider about your risk of cancer. You may be more at risk for certain types of cancer if you take this medicine. If you are going to need a MRI, CT scan, or other procedure, tell your doctor that you are using this medicine (On-Body Injector only). What side effects may I notice from receiving this medicine? Side effects that you should report to your doctor or health care professional as soon as possible:  allergic reactions (skin rash, itching or hives, swelling of the face, lips, or tongue)  back pain  dizziness  fever  pain, redness, or irritation at site where injected  pinpoint red spots on the skin  red or dark-brown urine  shortness of breath or breathing problems  stomach or side pain, or pain at the shoulder  swelling  tiredness  trouble passing urine or change in the amount of urine  unusual bruising or bleeding Side effects that usually do not require medical attention (report to your doctor or health care professional if they continue or are bothersome):  bone pain  muscle pain This list may not describe all possible side effects. Call your doctor for medical advice about side effects. You may report side effects to FDA at 1-800-FDA-1088. Where should I keep my medicine? Keep out of the reach of children. If you are using this medicine at home, you will be instructed on how to store it. Throw away any unused medicine after the expiration date on the label. NOTE: This sheet is a summary. It may not cover all possible information. If you have questions about this medicine, talk to your doctor, pharmacist, or health care provider.  2021 Elsevier/Gold Standard (2019-08-31 13:20:51)  Etoposide, VP-16 injection What is this medicine? ETOPOSIDE, VP-16 (e  toe POE side) is a chemotherapy drug. It is used to treat testicular cancer, lung cancer, and other cancers. This medicine may be used for other purposes; ask your health care provider or pharmacist if you have questions. COMMON BRAND NAME(S): Etopophos, Toposar, VePesid What should I tell my health care provider before I take this medicine? They need to know if you have any of these conditions:  infection  kidney disease  liver disease  low blood counts, like low white cell, platelet, or red cell counts  an unusual or allergic reaction to etoposide, other medicines, foods, dyes, or preservatives  pregnant or trying to get pregnant  breast-feeding How should I use this medicine? This medicine is for infusion into a vein. It is administered in a hospital or clinic by a specially trained health care professional. Talk to your pediatrician regarding the use of this medicine in children. Special care may be needed. Overdosage: If you think you have taken too much of this medicine contact a poison control center or emergency room at once. NOTE: This medicine is only for you. Do not share this medicine with others. What if I miss a dose? It is important not to miss your dose. Call your doctor or health care professional if you  are unable to keep an appointment. What may interact with this medicine? This medicine may interact with the following medications:  warfarin This list may not describe all possible interactions. Give your health care provider a list of all the medicines, herbs, non-prescription drugs, or dietary supplements you use. Also tell them if you smoke, drink alcohol, or use illegal drugs. Some items may interact with your medicine. What should I watch for while using this medicine? Visit your doctor for checks on your progress. This drug may make you feel generally unwell. This is not uncommon, as chemotherapy can affect healthy cells as well as cancer cells. Report any side  effects. Continue your course of treatment even though you feel ill unless your doctor tells you to stop. In some cases, you may be given additional medicines to help with side effects. Follow all directions for their use. Call your doctor or health care professional for advice if you get a fever, chills or sore throat, or other symptoms of a cold or flu. Do not treat yourself. This drug decreases your body's ability to fight infections. Try to avoid being around people who are sick. This medicine may increase your risk to bruise or bleed. Call your doctor or health care professional if you notice any unusual bleeding. Talk to your doctor about your risk of cancer. You may be more at risk for certain types of cancers if you take this medicine. Do not become pregnant while taking this medicine or for at least 6 months after stopping it. Women should inform their doctor if they wish to become pregnant or think they might be pregnant. Women of child-bearing potential will need to have a negative pregnancy test before starting this medicine. There is a potential for serious side effects to an unborn child. Talk to your health care professional or pharmacist for more information. Do not breast-feed an infant while taking this medicine. Men must use a latex condom during sexual contact with a woman while taking this medicine and for at least 4 months after stopping it. A latex condom is needed even if you have had a vasectomy. Contact your doctor right away if your partner becomes pregnant. Do not donate sperm while taking this medicine and for at least 4 months after you stop taking this medicine. Men should inform their doctors if they wish to father a child. This medicine may lower sperm counts. What side effects may I notice from receiving this medicine? Side effects that you should report to your doctor or health care professional as soon as possible:  allergic reactions like skin rash, itching or hives,  swelling of the face, lips, or tongue  low blood counts - this medicine may decrease the number of white blood cells, red blood cells, and platelets. You may be at increased risk for infections and bleeding  nausea, vomiting  redness, blistering, peeling or loosening of the skin, including inside the mouth  signs and symptoms of infection like fever; chills; cough; sore throat; pain or trouble passing urine  signs and symptoms of low red blood cells or anemia such as unusually weak or tired; feeling faint or lightheaded; falls; breathing problems  unusual bruising or bleeding Side effects that usually do not require medical attention (report to your doctor or health care professional if they continue or are bothersome):  changes in taste  diarrhea  hair loss  loss of appetite  mouth sores This list may not describe all possible side effects. Call your doctor  for medical advice about side effects. You may report side effects to FDA at 1-800-FDA-1088. Where should I keep my medicine? This drug is given in a hospital or clinic and will not be stored at home. NOTE: This sheet is a summary. It may not cover all possible information. If you have questions about this medicine, talk to your doctor, pharmacist, or health care provider.  2021 Elsevier/Gold Standard (2018-10-04 16:57:15)       To help prevent nausea and vomiting after your treatment, we encourage you to take your nausea medication as directed.  BELOW ARE SYMPTOMS THAT SHOULD BE REPORTED IMMEDIATELY: . *FEVER GREATER THAN 100.4 F (38 C) OR HIGHER . *CHILLS OR SWEATING . *NAUSEA AND VOMITING THAT IS NOT CONTROLLED WITH YOUR NAUSEA MEDICATION . *UNUSUAL SHORTNESS OF BREATH . *UNUSUAL BRUISING OR BLEEDING . *URINARY PROBLEMS (pain or burning when urinating, or frequent urination) . *BOWEL PROBLEMS (unusual diarrhea, constipation, pain near the anus) . TENDERNESS IN MOUTH AND THROAT WITH OR WITHOUT PRESENCE OF ULCERS (sore  throat, sores in mouth, or a toothache) . UNUSUAL RASH, SWELLING OR PAIN  . UNUSUAL VAGINAL DISCHARGE OR ITCHING   Items with * indicate a potential emergency and should be followed up as soon as possible or go to the Emergency Department if any problems should occur.  Please show the CHEMOTHERAPY ALERT CARD or IMMUNOTHERAPY ALERT CARD at check-in to the Emergency Department and triage nurse.  Should you have questions after your visit or need to cancel or reschedule your appointment, please contact [facility]  [phone] and follow the prompts.  Office hours are 8:00 a.m. to 4:30 p.m. Monday - Friday. Please note that voicemails left after 4:00 p.m. may not be returned until the following business day.  We are closed weekends and major holidays. You have access to a nurse at all times for urgent questions. Please call the main number to the clinic [phone] and follow the prompts.  For any non-urgent questions, you may also contact your provider using MyChart. We now offer e-Visits for anyone 14 and older to request care online for non-urgent symptoms. For details visit [email or web address].   Also download the MyChart app! Go to the app store, search "MyChart", open the app, select [facility], and log in with your MyChart username and password.  Due to Covid, a mask is required upon entering the hospital/clinic. If you do not have a mask, one will be given to you upon arrival. For doctor visits, patients may have 1 support person aged 13 or older with them. For treatment visits, patients cannot have anyone with them due to current Covid guidelines and our immunocompromised population.

## 2020-12-25 ENCOUNTER — Encounter: Payer: Self-pay | Admitting: Oncology

## 2020-12-25 ENCOUNTER — Ambulatory Visit
Admission: RE | Admit: 2020-12-25 | Discharge: 2020-12-25 | Disposition: A | Discharge status: Home / Self Care | Payer: Medicare Other | Source: Ambulatory Visit | Attending: Radiation Oncology | Admitting: Radiation Oncology

## 2020-12-25 ENCOUNTER — Encounter: Payer: Self-pay | Admitting: Radiation Oncology

## 2020-12-25 ENCOUNTER — Other Ambulatory Visit: Payer: Self-pay | Admitting: *Deleted

## 2020-12-25 DIAGNOSIS — C7951 Secondary malignant neoplasm of bone: Secondary | ICD-10-CM | POA: Insufficient documentation

## 2020-12-25 DIAGNOSIS — C7931 Secondary malignant neoplasm of brain: Secondary | ICD-10-CM | POA: Diagnosis not present

## 2020-12-25 DIAGNOSIS — C3490 Malignant neoplasm of unspecified part of unspecified bronchus or lung: Secondary | ICD-10-CM | POA: Diagnosis not present

## 2020-12-25 DIAGNOSIS — Z510 Encounter for antineoplastic radiation therapy: Secondary | ICD-10-CM | POA: Insufficient documentation

## 2020-12-25 DIAGNOSIS — C3432 Malignant neoplasm of lower lobe, left bronchus or lung: Secondary | ICD-10-CM | POA: Insufficient documentation

## 2020-12-25 DIAGNOSIS — Z79899 Other long term (current) drug therapy: Secondary | ICD-10-CM | POA: Diagnosis not present

## 2020-12-25 MED ORDER — LANSOPRAZOLE 30 MG PO CPDR: 30.0000 mg | DELAYED_RELEASE_CAPSULE | Freq: Every day | ORAL | 0 refills | Status: DC

## 2020-12-25 MED ORDER — DEXAMETHASONE 4 MG PO TABS: 4.0000 mg | ORAL_TABLET | Freq: Every day | ORAL | 0 refills | Status: DC

## 2020-12-25 NOTE — Consult Note (Signed)
NEW PATIENT EVALUATION  Name: Beth Mcmillan  MRN: [id number]  Date:   12/25/2020     DOB: 1946/02/11   This 75 y.o. 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  REFERRING PHYSICIAN: Idelle Crouch, MD  CHIEF COMPLAINT:  Chief Complaint  Patient presents with  . Cancer    Initial consultation    DIAGNOSIS: The encounter diagnosis was Brain metastasis ([place]).   PREVIOUS INVESTIGATIONS:  PET/CT and CT scans reviewed MRI of brain reviewed Clinical notes reviewed Pathology report reviewed  HPI: Patient is a 75 year old female who presents with an abnormal chest x-ray.  CT scan confirmed a 2.6 cm mass in the left lower lobe.  This area was hypermetabolic on PET CT scan and there was evidence of hypomanic focus in the right iliac bone consistent with malignancy.  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  CT-guided biopsy of her lung mass was also positive for small cell undifferentiated carcinoma.  She had a brain MRI scan which was positive for infratentorial and supratentorial met metastatic disease with mild edema.  She is scheduled to startcarbo etoposide Tecentriq chemotherapy.  She is fairly asymptomatic does complain of right hip pain on ambulation.  She specifically [place] cough hemoptysis or chest tightness or any neurologic complaints.  Visual fields within normal range no focal neurologic deficits are reported.  She is now referred to ration collagen for consideration of treatment.  PLANNED TREATMENT REGIMEN: Whole brain and right iliac bone palliative treatment  PAST MEDICAL HISTORY:  has a past medical history of Anxiety, Breathing problem, COPD (chronic obstructive pulmonary disease) ([place]), Dysrhythmia, Emphysema of lung ([place]), Hemorrhoid, History of hiatal hernia, Hypercholesteremia, Hypertension,  Hypothyroidism, Larynx polyp, Lung cancer ([place]), Palpitations, Platelets decreased ([place]) (2020), Seasonal allergies, Tinnitus, Vertigo, Wears dentures, and White coat syndrome with hypertension.    PAST SURGICAL HISTORY:  Past Surgical History:  Procedure Laterality Date  . ABDOMINAL HYSTERECTOMY     partial  . COLONOSCOPY WITH PROPOFOL N/A 01/22/2016   Procedure: COLONOSCOPY WITH PROPOFOL;  Surgeon: Lucilla Lame, MD;  Location: [facility];  Service: Endoscopy;  Laterality: N/A;  . COLONOSCOPY WITH PROPOFOL N/A 04/12/2019   Procedure: COLONOSCOPY WITH BIOPSY;  Surgeon: Lucilla Lame, MD;  Location: [facility];  Service: Endoscopy;  Laterality: N/A;  pt would like an early appt  . ct guided lung bx    . IR IMAGING GUIDED PORT INSERTION  12/18/2020  . POLYPECTOMY  01/22/2016   Procedure: POLYPECTOMY;  Surgeon: Lucilla Lame, MD;  Location: [facility];  Service: Endoscopy;;  . POLYPECTOMY N/A 04/12/2019   Procedure: POLYPECTOMY;  Surgeon: Lucilla Lame, MD;  Location: [facility];  Service: Endoscopy;  Laterality: N/A;  . VIDEO BRONCHOSCOPY WITH ENDOBRONCHIAL NAVIGATION N/A 11/21/2020   Procedure: VIDEO BRONCHOSCOPY WITH ENDOBRONCHIAL NAVIGATION;  Surgeon: Ottie Glazier, MD;  Location: [facility];  Service: Thoracic;  Laterality: N/A;  . VIDEO BRONCHOSCOPY WITH ENDOBRONCHIAL ULTRASOUND N/A 11/21/2020   Procedure: VIDEO BRONCHOSCOPY WITH ENDOBRONCHIAL ULTRASOUND;  Surgeon: Ottie Glazier, MD;  Location: [facility];  Service: Thoracic;  Laterality: N/A;    FAMILY HISTORY: family history includes Alzheimer's disease in her mother; Anxiety disorder in her sister; Heart attack in her mother; Heart block in her mother and sister; High Cholesterol in her mother; Hypertension in her mother and sister; Lung cancer in her father; Uterine  cancer in her sister.  SOCIAL HISTORY:  reports that she quit smoking about 7 weeks ago. Her smoking use included cigarettes. She has a 25.00 pack-year  smoking history. She has never used smokeless tobacco. She reports that she does not drink alcohol and does not use drugs.  ALLERGIES: Augmentin [amoxicillin-pot clavulanate], Codeine, Levaquin [levofloxacin], and Tape  MEDICATIONS:  Current Outpatient Medications  Medication Sig Dispense Refill  . albuterol (VENTOLIN HFA) 108 (90 Base) MCG/ACT inhaler Inhale 2 puffs into the lungs 2 (two) times daily.    Marland Kitchen amLODipine (NORVASC) 5 MG tablet Take 5 mg by mouth at bedtime.    Marland Kitchen atorvastatin (LIPITOR) 10 MG tablet TAKE 1 TABLET BY MOUTH DAILY (Patient taking differently: Take 10 mg by mouth at bedtime.) 90 tablet 0  . levothyroxine (SYNTHROID, LEVOTHROID) 50 MCG tablet Take 50 mcg by mouth daily before breakfast.    . lidocaine-prilocaine (EMLA) cream Apply to affected area once 30 g 3  . loratadine (CLARITIN) 10 MG tablet Take 10 mg by mouth daily. Reported on 01/22/2016    . LORazepam (ATIVAN) 0.5 MG tablet Take 1 tablet (0.5 mg total) by mouth every 6 (six) hours as needed for anxiety. 120 tablet 0  . losartan-hydrochlorothiazide (HYZAAR) 100-25 MG tablet Take 1 tablet by mouth daily.    . metoprolol succinate (TOPROL-XL) 50 MG 24 hr tablet Take 50 mg by mouth daily.    Marland Kitchen PARoxetine (PAXIL) 10 MG tablet Take 5 mg by mouth every evening.    Marland Kitchen dexamethasone (DECADRON) 4 MG tablet Take 1 tablet (4 mg total) by mouth daily. 30 tablet 0  . lansoprazole (PREVACID) 30 MG capsule Take 1 capsule (30 mg total) by mouth daily. 30 capsule 0  . ondansetron (ZOFRAN) 8 MG tablet Take 1 tablet (8 mg total) by mouth 2 (two) times daily as needed for refractory nausea / vomiting. Start on day 3 after carboplatin chemo. (Patient not taking: No sig reported) 30 tablet 1  . prochlorperazine (COMPAZINE) 10 MG tablet Take 1 tablet (10 mg total) by mouth every 6 (six) hours as needed (Nausea or vomiting). (Patient not taking: No sig reported) 30 tablet 1   No current facility-administered medications for this encounter.     ECOG PERFORMANCE STATUS:  0 - Asymptomatic  REVIEW OF SYSTEMS: Patient denies any weight loss, fatigue, weakness, fever, chills or night sweats. Patient denies any loss of vision, blurred vision. Patient denies any ringing  of the ears or hearing loss. No irregular heartbeat. Patient denies heart murmur or history of fainting. Patient denies any chest pain or pain radiating to her upper extremities. Patient denies any shortness of breath, difficulty breathing at night, cough or hemoptysis. Patient denies any swelling in the lower legs. Patient denies any nausea vomiting, vomiting of blood, or coffee ground material in the vomitus. Patient denies any stomach pain. Patient states has had normal bowel movements no significant constipation or diarrhea. Patient denies any dysuria, hematuria or significant nocturia. Patient denies any problems walking, swelling in the joints or loss of balance. Patient denies any skin changes, loss of hair or loss of weight. Patient denies any excessive worrying or anxiety or significant depression. Patient denies any problems with insomnia. Patient denies excessive thirst, polyuria, polydipsia. Patient denies any swollen glands, patient denies easy bruising or easy bleeding. Patient denies any recent infections, allergies or URI. Patient "s visual fields have not changed significantly in recent time.   PHYSICAL EXAM: BP (!) (P) 160/81 (BP Location: Left Arm,  Patient Position: Sitting)   Pulse (!) (P) 102   Temp (!) (P) 96.5 F (35.8 C) (Tympanic)   Resp (P) 16   Wt (P) 147 lb 11.2 oz (67 kg)   BMI (P) 23.13 kg/m  Well-developed well-nourished patient in NAD. HEENT reveals PERLA, EOMI, discs not visualized.  Oral cavity is clear. No oral mucosal lesions are identified. Neck is clear without evidence of cervical or supraclavicular adenopathy. Lungs are clear to A&P. Cardiac examination is essentially unremarkable with regular rate and rhythm without murmur rub or  thrill. Abdomen is benign with no organomegaly or masses noted. Motor sensory and DTR levels are equal and symmetric in the upper and lower extremities. Cranial nerves II through XII are grossly intact. Proprioception is intact. No peripheral adenopathy or edema is identified. No motor or sensory levels are noted. Crude visual fields are within normal range.  LABORATORY DATA: Pathology report reviewed compatible with above-stated findings    RADIOLOGY RESULTS: MRI of brain CT and PET CT scans all reviewed compatible with above-stated findings   IMPRESSION: Extensive stage small cell lung cancer with brain and bone metastasis in 75 year old female  PLAN: At this time I would like to go ahead with radiation therapy to her whole brain.  Would plan on delivering 30 Gray in 10 fractions.  I believe the same time without more undue side effects can treat her right iliac bone lesion again 30 Gray in 10 fractions for palliation.  Risks and benefits of both treatments including skin reaction fatigue loss of hair possible some slight diarrhea from her pelvic field all were described in detail to the patient and her daughter.  I am starting the patient on 4 mg of Decadron once a day.  I am also putting her on Prevacid to protect her stomach.  I have simulated her today and will start her treatments first thing next week.  Anticipate 2 weeks of treatment and then will turn her back over to medical oncologist to initiate for systemic treatment.  Patient and daughter both comprehend my recommendations well.  I would like to take this opportunity to thank you for allowing me to participate in the care of your patient.Noreene Filbert, MD

## 2020-12-26 ENCOUNTER — Telehealth: Payer: Self-pay | Admitting: Adult Health

## 2020-12-26 DIAGNOSIS — C3490 Malignant neoplasm of unspecified part of unspecified bronchus or lung: Secondary | ICD-10-CM | POA: Diagnosis present

## 2020-12-26 DIAGNOSIS — Z510 Encounter for antineoplastic radiation therapy: Secondary | ICD-10-CM | POA: Diagnosis not present

## 2020-12-26 NOTE — Telephone Encounter (Signed)
Attempted to call Lexine Baton to review Evusheld as requested by patient.  I called [phone].  Nikki did not answer and I could not leave a voice mail because her voice mail was full.  Mychart message sent to patient.    Wilber Bihari, NP

## 2020-12-29 ENCOUNTER — Other Ambulatory Visit: Payer: Self-pay | Admitting: *Deleted

## 2020-12-29 ENCOUNTER — Ambulatory Visit
Admission: RE | Admit: 2020-12-29 | Discharge: 2020-12-29 | Disposition: A | Discharge status: Home / Self Care | Payer: Medicare Other | Source: Ambulatory Visit | Attending: Radiation Oncology | Admitting: Radiation Oncology

## 2020-12-29 DIAGNOSIS — C3490 Malignant neoplasm of unspecified part of unspecified bronchus or lung: Secondary | ICD-10-CM

## 2020-12-29 DIAGNOSIS — Z510 Encounter for antineoplastic radiation therapy: Secondary | ICD-10-CM | POA: Diagnosis not present

## 2020-12-30 ENCOUNTER — Telehealth: Payer: Self-pay | Admitting: Oncology

## 2020-12-30 ENCOUNTER — Ambulatory Visit
Admission: RE | Admit: 2020-12-30 | Discharge: 2020-12-30 | Disposition: A | Discharge status: Home / Self Care | Payer: Medicare Other | Source: Ambulatory Visit | Attending: Radiation Oncology | Admitting: Radiation Oncology

## 2020-12-30 ENCOUNTER — Telehealth: Payer: Self-pay | Admitting: *Deleted

## 2020-12-30 DIAGNOSIS — Z510 Encounter for antineoplastic radiation therapy: Secondary | ICD-10-CM | POA: Diagnosis not present

## 2020-12-30 NOTE — Telephone Encounter (Signed)
Push it out by 2 weeks and have her see covering NP/ MD at that time for cycle 2

## 2020-12-30 NOTE — Telephone Encounter (Signed)
Pt's daughter is asking if chemo dates on 5/23, 5/24, and 5/25 need to be rescheduled due to pt taking oral steroids for brain radiation. Pt will receive last radiation on 5/20 but will be on steroid taper at the time of her next chemo.   Please advise.

## 2020-12-30 NOTE — Telephone Encounter (Signed)
Left VM with patient's daughter to notify her of appointments for cycle 2 of chemo rescheduled to 2 weeks out.

## 2020-12-31 ENCOUNTER — Ambulatory Visit
Admission: RE | Admit: 2020-12-31 | Discharge: 2020-12-31 | Disposition: A | Discharge status: Home / Self Care | Payer: Medicare Other | Source: Ambulatory Visit | Attending: Radiation Oncology | Admitting: Radiation Oncology

## 2020-12-31 ENCOUNTER — Other Ambulatory Visit: Payer: Self-pay | Admitting: Adult Health

## 2020-12-31 DIAGNOSIS — C3490 Malignant neoplasm of unspecified part of unspecified bronchus or lung: Secondary | ICD-10-CM

## 2020-12-31 DIAGNOSIS — Z510 Encounter for antineoplastic radiation therapy: Secondary | ICD-10-CM | POA: Diagnosis not present

## 2020-12-31 NOTE — Progress Notes (Signed)
I connected by phone with Beth Mcmillan on 12/31/2020, 12:42 PM to discuss the potential use of a new treatment, tixagevimab/cilgavimab, for pre-exposure prophylaxis for prevention of coronavirus disease 2019 (COVID-19) caused by the SARS-CoV-2 virus.  This patient is a 75 y.o. female that meets the FDA criteria for Emergency Use Authorization of tixagevimab/cilgavimab for pre-exposure prophylaxis of COVID-19 disease. Pt meets following criteria:  Age >12 yr and weight > 40kg  Not currently infected with SARS-CoV-2 and has no known recent exposure to an individual infected with SARS-CoV-2 AND o Who has moderate to severe immune compromise due to a medical condition or receipt of immunosuppressive medications or treatments and may not mount an adequate immune response to COVID-19 vaccination or  o Vaccination with any available COVID-19 vaccine, according to the approved or authorized schedule, is not recommended due to a history of severe adverse reaction (e.g., severe allergic reaction) to a COVID-19 vaccine(s) and/or COVID-19 vaccine component(s).  o Patient meets the following definition of mod-severe immune compromised status: 6. Other actively treated hematologic malignancies or severe congenital immunodeficiency syndromes  I have spoken and communicated the following to the patient or parent/caregiver regarding COVID monoclonal antibody treatment:  1. FDA has authorized the emergency use of tixagevimab/cilgavimab for the pre-exposure prophylaxis of COVID-19 in patients with moderate-severe immunocompromised status, who meet above EUA criteria.  2. The significant known and potential risks and benefits of COVID monoclonal antibody, and the extent to which such potential risks and benefits are unknown.  3. Information on available alternative treatments and the risks and benefits of those alternatives, including clinical trials.  4. The patient or parent/caregiver has the option to accept or  refuse COVID monoclonal antibody treatment.  After reviewing this information with the patient, agree to receive tixagevimab/cilgavimab.  This is already scheduled for tomorrow.Scot Dock, NP, 12/31/2020, 12:42 PM

## 2021-01-01 ENCOUNTER — Inpatient Hospital Stay: Payer: Medicare Other

## 2021-01-01 ENCOUNTER — Other Ambulatory Visit: Payer: Self-pay

## 2021-01-01 ENCOUNTER — Encounter: Payer: Self-pay | Admitting: Oncology

## 2021-01-01 ENCOUNTER — Encounter: Payer: Self-pay | Admitting: *Deleted

## 2021-01-01 ENCOUNTER — Other Ambulatory Visit: Payer: Self-pay | Admitting: *Deleted

## 2021-01-01 ENCOUNTER — Inpatient Hospital Stay (HOSPITAL_BASED_OUTPATIENT_CLINIC_OR_DEPARTMENT_OTHER): Payer: Medicare Other | Admitting: Oncology

## 2021-01-01 ENCOUNTER — Ambulatory Visit
Admission: RE | Admit: 2021-01-01 | Discharge: 2021-01-01 | Disposition: A | Discharge status: Home / Self Care | Payer: Medicare Other | Source: Ambulatory Visit | Attending: Radiation Oncology | Admitting: Radiation Oncology

## 2021-01-01 ENCOUNTER — Inpatient Hospital Stay: Payer: Medicare Other | Admitting: Oncology

## 2021-01-01 VITALS — BP 146/69 | HR 78 | Temp 97.2°F | Resp 18 | Wt 145.8 lb

## 2021-01-01 VITALS — BP 125/61 | HR 71 | Resp 18

## 2021-01-01 DIAGNOSIS — C7951 Secondary malignant neoplasm of bone: Secondary | ICD-10-CM | POA: Diagnosis not present

## 2021-01-01 DIAGNOSIS — Z885 Allergy status to narcotic agent status: Secondary | ICD-10-CM | POA: Diagnosis not present

## 2021-01-01 DIAGNOSIS — C7931 Secondary malignant neoplasm of brain: Secondary | ICD-10-CM | POA: Diagnosis present

## 2021-01-01 DIAGNOSIS — I700 Atherosclerosis of aorta: Secondary | ICD-10-CM | POA: Diagnosis not present

## 2021-01-01 DIAGNOSIS — Z5112 Encounter for antineoplastic immunotherapy: Secondary | ICD-10-CM | POA: Diagnosis not present

## 2021-01-01 DIAGNOSIS — R5383 Other fatigue: Secondary | ICD-10-CM | POA: Diagnosis not present

## 2021-01-01 DIAGNOSIS — Z8719 Personal history of other diseases of the digestive system: Secondary | ICD-10-CM | POA: Diagnosis not present

## 2021-01-01 DIAGNOSIS — Z95828 Presence of other vascular implants and grafts: Secondary | ICD-10-CM

## 2021-01-01 DIAGNOSIS — E871 Hypo-osmolality and hyponatremia: Secondary | ICD-10-CM | POA: Diagnosis not present

## 2021-01-01 DIAGNOSIS — R630 Anorexia: Secondary | ICD-10-CM | POA: Diagnosis not present

## 2021-01-01 DIAGNOSIS — Z5111 Encounter for antineoplastic chemotherapy: Secondary | ICD-10-CM | POA: Diagnosis not present

## 2021-01-01 DIAGNOSIS — Z79899 Other long term (current) drug therapy: Secondary | ICD-10-CM | POA: Diagnosis not present

## 2021-01-01 DIAGNOSIS — E876 Hypokalemia: Secondary | ICD-10-CM

## 2021-01-01 DIAGNOSIS — I2510 Atherosclerotic heart disease of native coronary artery without angina pectoris: Secondary | ICD-10-CM | POA: Diagnosis not present

## 2021-01-01 DIAGNOSIS — Z87891 Personal history of nicotine dependence: Secondary | ICD-10-CM | POA: Diagnosis not present

## 2021-01-01 DIAGNOSIS — Z510 Encounter for antineoplastic radiation therapy: Secondary | ICD-10-CM | POA: Diagnosis present

## 2021-01-01 DIAGNOSIS — C3432 Malignant neoplasm of lower lobe, left bronchus or lung: Secondary | ICD-10-CM | POA: Diagnosis not present

## 2021-01-01 DIAGNOSIS — D61810 Antineoplastic chemotherapy induced pancytopenia: Secondary | ICD-10-CM | POA: Diagnosis not present

## 2021-01-01 DIAGNOSIS — R634 Abnormal weight loss: Secondary | ICD-10-CM | POA: Diagnosis not present

## 2021-01-01 DIAGNOSIS — Z880 Allergy status to penicillin: Secondary | ICD-10-CM | POA: Diagnosis not present

## 2021-01-01 DIAGNOSIS — Z298 Encounter for other specified prophylactic measures: Secondary | ICD-10-CM | POA: Diagnosis not present

## 2021-01-01 DIAGNOSIS — G939 Disorder of brain, unspecified: Secondary | ICD-10-CM | POA: Diagnosis not present

## 2021-01-01 DIAGNOSIS — C3490 Malignant neoplasm of unspecified part of unspecified bronchus or lung: Secondary | ICD-10-CM

## 2021-01-01 DIAGNOSIS — Z881 Allergy status to other antibiotic agents status: Secondary | ICD-10-CM | POA: Diagnosis not present

## 2021-01-01 LAB — COMPREHENSIVE METABOLIC PANEL
ALT: 20 U/L (ref 0–44)
AST: 16 U/L (ref 15–41)
Albumin: 3.7 g/dL (ref 3.5–5.0)
Alkaline Phosphatase: 69 U/L (ref 38–126)
Anion gap: 10 (ref 5–15)
BUN: 19 mg/dL (ref 8–23)
CO2: 28 mmol/L (ref 22–32)
Calcium: 8.9 mg/dL (ref 8.9–10.3)
Chloride: 89 mmol/L — ABNORMAL LOW (ref 98–111)
Creatinine, Ser: 0.77 mg/dL (ref 0.44–1.00)
GFR, Estimated: >60 mL/min (ref >60)
Glucose, Bld: 105 mg/dL — ABNORMAL HIGH (ref 70–99)
Potassium: 3 mmol/L — ABNORMAL LOW (ref 3.5–5.1)
Sodium: 127 mmol/L — ABNORMAL LOW (ref 135–145)
Total Bilirubin: 1 mg/dL (ref 0.3–1.2)
Total Protein: 6.3 g/dL — ABNORMAL LOW (ref 6.5–8.1)

## 2021-01-01 LAB — CBC WITH DIFFERENTIAL/PLATELET
Abs Immature Granulocytes: 0 10*3/uL (ref 0.00–0.07)
Band Neutrophils: 0 %
Basophils Absolute: 0 10*3/uL (ref 0.0–0.1)
Basophils Relative: 0 %
Blasts: 0 %
Eosinophils Absolute: 0 10*3/uL (ref 0.0–0.5)
Eosinophils Relative: 1 %
HCT: 29 % — ABNORMAL LOW (ref 36.0–46.0)
Hemoglobin: 10.3 g/dL — ABNORMAL LOW (ref 12.0–15.0)
Lymphocytes Relative: 61 %
Lymphs Abs: 0.5 10*3/uL — ABNORMAL LOW (ref 0.7–4.0)
MCH: 31.8 pg (ref 26.0–34.0)
MCHC: 35.5 g/dL (ref 30.0–36.0)
MCV: 89.5 fL (ref 80.0–100.0)
Metamyelocytes Relative: 0 %
Monocytes Absolute: 0.2 10*3/uL (ref 0.1–1.0)
Monocytes Relative: 23 %
Myelocytes: 0 %
Neutro Abs: 0.1 10*3/uL — CL (ref 1.7–7.7)
Neutrophils Relative %: 15 %
Other: 0 %
Platelets: 66 10*3/uL — ABNORMAL LOW (ref 150–400)
Promyelocytes Relative: 0 %
RBC: 3.24 MIL/uL — ABNORMAL LOW (ref 3.87–5.11)
RDW: 12.4 % (ref 11.5–15.5)
WBC: 0.8 10*3/uL — CL (ref 4.0–10.5)
nRBC: 0 % (ref 0.0–0.2)
nRBC: 0 /100 WBC

## 2021-01-01 LAB — TSH: TSH: 0.235 u[IU]/mL — ABNORMAL LOW (ref 0.350–4.500)

## 2021-01-01 MED ORDER — TIXAGEVIMAB (PART OF EVUSHELD) INJECTION
300.0000 mg | Freq: Once | INTRAMUSCULAR | Status: AC
  Administered 2021-01-01: 300 mg via INTRAMUSCULAR
  Filled 2021-01-01: qty 3

## 2021-01-01 MED ORDER — POTASSIUM CHLORIDE CRYS ER 20 MEQ PO TBCR: 20.0000 meq | EXTENDED_RELEASE_TABLET | Freq: Every day | ORAL | 0 refills | Status: DC

## 2021-01-01 MED ORDER — HEPARIN SOD (PORK) LOCK FLUSH 100 UNIT/ML IV SOLN
500.0000 [IU] | Freq: Once | INTRAVENOUS | Status: AC
Start: 2021-01-01 — End: 2021-01-01
  Administered 2021-01-01: 500 [IU] via INTRAVENOUS
  Filled 2021-01-01: qty 5

## 2021-01-01 MED ORDER — SODIUM CHLORIDE 0.9% FLUSH
10.0000 mL | Freq: Once | INTRAVENOUS | Status: AC
Start: 2021-01-01 — End: 2021-01-01
  Administered 2021-01-01: 10 mL via INTRAVENOUS
  Filled 2021-01-01: qty 10

## 2021-01-01 MED ORDER — HEPARIN SOD (PORK) LOCK FLUSH 100 UNIT/ML IV SOLN
INTRAVENOUS | Status: AC
  Filled 2021-01-01: qty 5

## 2021-01-01 MED ORDER — CILGAVIMAB (PART OF EVUSHELD) INJECTION
300.0000 mg | Freq: Once | INTRAMUSCULAR | Status: AC
  Administered 2021-01-01: 300 mg via INTRAMUSCULAR
  Filled 2021-01-01: qty 3

## 2021-01-01 MED ORDER — POTASSIUM CHLORIDE IN NACL 20-0.9 MEQ/L-% IV SOLN
Freq: Once | INTRAVENOUS | Status: AC
  Administered 2021-01-01: 1000 mL via INTRAVENOUS
  Filled 2021-01-01: qty 1000

## 2021-01-01 NOTE — Patient Instructions (Signed)
[facility]    Discharge Instructions:  Thank you for choosing [facility] to provide your oncology and hematology care.  If you have a lab appointment with the [facility], please go directly to the [facility] and check in at the registration area.  Wear comfortable clothing and clothing appropriate for easy access to any Portacath or PICC line.   We strive to give you quality time with your provider. You may need to reschedule your appointment if you arrive late (15 or more minutes).  Arriving late affects you and other patients whose appointments are after yours.  Also, if you miss three or more appointments without notifying the office, you may be dismissed from the clinic at the provider's discretion.      For prescription refill requests, have your pharmacy contact our office and allow 72 hours for refills to be completed.    Today you received the following chemotherapy and/or immunotherapy agents: EVUSHELD. Today you also received the following treatment: Potassium Chloride.   To help prevent nausea and vomiting after your treatment, we encourage you to take your nausea medication as directed.  BELOW ARE SYMPTOMS THAT SHOULD BE REPORTED IMMEDIATELY: . *FEVER GREATER THAN 100.4 F (38 C) OR HIGHER . *CHILLS OR SWEATING . *NAUSEA AND VOMITING THAT IS NOT CONTROLLED WITH YOUR NAUSEA MEDICATION . *UNUSUAL SHORTNESS OF BREATH . *UNUSUAL BRUISING OR BLEEDING . *URINARY PROBLEMS (pain or burning when urinating, or frequent urination) . *BOWEL PROBLEMS (unusual diarrhea, constipation, pain near the anus) . TENDERNESS IN MOUTH AND THROAT WITH OR WITHOUT PRESENCE OF ULCERS (sore throat, sores in mouth, or a toothache) . UNUSUAL RASH, SWELLING OR PAIN  . UNUSUAL VAGINAL DISCHARGE OR ITCHING   Items with * indicate a potential emergency and should be followed up as soon as possible or go to the Emergency Department if any problems  should occur.  Please show the CHEMOTHERAPY ALERT CARD or IMMUNOTHERAPY ALERT CARD at check-in to the Emergency Department and triage nurse.  Should you have questions after your visit or need to cancel or reschedule your appointment, please contact [facility]  [phone] and follow the prompts.  Office hours are 8:00 a.m. to 4:30 p.m. Monday - Friday. Please note that voicemails left after 4:00 p.m. may not be returned until the following business day.  We are closed weekends and major holidays. You have access to a nurse at all times for urgent questions. Please call the main number to the clinic [phone] and follow the prompts.  For any non-urgent questions, you may also contact your provider using MyChart. We now offer e-Visits for anyone 61 and older to request care online for non-urgent symptoms. For details visit [email or web address].   Also download the MyChart app! Go to the app store, search "MyChart", open the app, select [facility], and log in with your MyChart username and password.  Due to Covid, a mask is required upon entering the hospital/clinic. If you do not have a mask, one will be given to you upon arrival. For doctor visits, patients may have 1 support person aged 73 or older with them. For treatment visits, patients cannot have anyone with them due to current Covid guidelines and our immunocompromised population.   Potassium chloride injection  What is this medicine? POTASSIUM CHLORIDE (poe TASS i um KLOOR ide) is a potassium supplement used to prevent and to treat low potassium. Potassium is important for the heart, muscles, and  nerves. Too much or too little potassium in the body can cause serious problems. This medicine may be used for other purposes; ask your health care provider or pharmacist if you have questions. COMMON BRAND NAME(S): PROAMP What should I tell my health care provider before I take this medicine? They need  to know if you have any of these conditions:  Addison disease  dehydration  diabetes (high blood sugar)  heart disease  high levels of potassium in the blood  irregular heartbeat or rhythm  kidney disease  large areas of burned skin  an unusual or allergic reaction to potassium, other medicines, foods, dyes, or preservatives  pregnant or trying to get pregnant  breast-feeding How should I use this medicine? This medicine is injected into a vein. It is given by a health care provider in a hospital or clinic setting. Talk to your health care provider about the use of this medicine in children. Special care may be needed. Overdosage: If you think you have taken too much of this medicine contact a poison control center or emergency room at once. NOTE: This medicine is only for you. Do not share this medicine with others. What if I miss a dose? This does not apply. This medicine is not for regular use. What may interact with this medicine? Do not take this medicine with any of the following medications:  certain diuretics such as spironolactone, triamterene  eplerenone  sodium polystyrene sulfonate This medicine may also interact with the following medications:  certain medicines for blood pressure or heart disease like lisinopril, losartan, quinapril, valsartan  medicines that lower your chance of fighting infection such as cyclosporine, tacrolimus  NSAIDs, medicines for pain and inflammation, like ibuprofen or naproxen  other potassium supplements  salt substitutes This list may not describe all possible interactions. Give your health care provider a list of all the medicines, herbs, non-prescription drugs, or dietary supplements you use. Also tell them if you smoke, drink alcohol, or use illegal drugs. Some items may interact with your medicine. What should I watch for while using this medicine? Visit your health care provider for regular checks on your progress. Tell  your health care provider if your symptoms do not start to get better or if they get worse. You may need blood work while you are taking this medicine. Avoid salt substitutes unless you are told otherwise by your health care provider. What side effects may I notice from receiving this medicine? Side effects that you should report to your doctor or health care professional as soon as possible:  allergic reactions (skin rash, itching, hives, swelling of the face, lips, tongue, or throat)  confusion  high potassium levels (muscle weakness, fast or irregular heartbeat)  low blood pressure (dizziness, feeling faint or lightheaded, blurry vision)  pain, tingling, or numbness in lips, hands, or feet  pain, redness, or irritation at site where injected  trouble breathing Side effects that usually do not require medical attention (report to your doctor or health care professional if they continue or are bothersome):  diarrhea  nausea, vomiting  passing gas  stomach pain This list may not describe all possible side effects. Call your doctor for medical advice about side effects. You may report side effects to FDA at 1-800-FDA-1088. Where should I keep my medicine? This medicine is given in a hospital or clinic. It will not be stored at home. NOTE: This sheet is a summary. It may not cover all possible information. If you have questions  about this medicine, talk to your doctor, pharmacist, or health care provider.  2021 Elsevier/Gold Standard (2019-06-07 18:18:09)

## 2021-01-01 NOTE — Progress Notes (Signed)
Pt in for follow up and treatment today.  

## 2021-01-01 NOTE — Progress Notes (Signed)
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 She underwent CT chest with contrast in March 2022 following an abnormal x-ray which showed a 2.6 cm mass in the left lower lobe. This was followed by a PET CT scan which showed a 3.0 x 2.0 cm mass in the left lower lobe of the lung with an SUV of 9.7. No evidence of locoregional adenopathy. No evidence of intra-abdominal disease with patient was noted to have a hypermetabolic focus in the right iliac bone with an SUV of 11.1 which was suspicious for metastatic disease. 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 Cells were positive for TTF-1, CD56 and chromogranin.  Right iliac bone biopsy was done and results were consistent with small cell lung cancer  Patient received 1 dose of carbo etoposide Tecentriq chemotherapy and following that she had an MRI brain which showedAt least 3 distinct 0.5 to 1 cm lesions in the right parieto-occipital region as well as cerebellum with mild associated edema.  Plan is therefore to hold off on chemotherapy until she completes whole brain radiation and steroid taper  Interval history-patient tolerated treatment well and did not have any significant nausea or vomiting.  ECOG PS- 1 Pain scale- 0 Opioid associated constipation-  no  Review of systems- Review of Systems  Constitutional: Positive for malaise/fatigue. Negative for chills, fever and weight loss.  HENT: Negative for congestion, ear discharge and nosebleeds.   Eyes: Negative for blurred vision.  Respiratory: Negative for cough, hemoptysis, sputum production, shortness of breath and wheezing.   Cardiovascular: Negative for chest pain, palpitations, orthopnea and claudication.  Gastrointestinal: Negative for abdominal pain, blood in stool, constipation, diarrhea, heartburn, melena, nausea and vomiting.  Genitourinary: Negative for dysuria, flank pain, frequency, hematuria and urgency.  Musculoskeletal: Negative for back pain, joint pain and myalgias.  Skin: Negative for rash.  Neurological: Negative for dizziness, tingling, focal weakness, seizures, weakness and headaches.  Endo/Heme/Allergies: Does not bruise/bleed easily.  Psychiatric/Behavioral: Negative for depression and suicidal ideas. The patient is nervous/anxious. The patient does not have insomnia.       Allergies  Allergen Reactions  . Augmentin [Amoxicillin-Pot Clavulanate]     "messes up my blood cells"  . Codeine Nausea Only  . Levaquin [Levofloxacin]     Affected lab results   . Tape Rash    Some bandaids cause rash sometimes     Past Medical History:  Diagnosis Date  . Anxiety   . Breathing problem    low breathing function 53%  . COPD (chronic obstructive pulmonary disease) (HCC)    EMPHYSEMA  . Dysrhythmia    tachycardia  . Emphysema of lung ([place])   . Hemorrhoid   . History of hiatal hernia    SMALL- 2022  . Hypercholesteremia   . Hypertension   . Hypothyroidism   . Larynx polyp   . Lung cancer ([place])   . Palpitations  with anxiety  . Platelets decreased ([place]) 2020  . Seasonal allergies   . Tinnitus   . Vertigo    no episodes in several years  . Wears dentures    partial lower  . White coat syndrome with hypertension      Past Surgical History:   Procedure Laterality Date  . ABDOMINAL HYSTERECTOMY     partial  . COLONOSCOPY WITH PROPOFOL N/A 01/22/2016   Procedure: COLONOSCOPY WITH PROPOFOL;  Surgeon: Lucilla Lame, MD;  Location: [place];  Service: Endoscopy;  Laterality: N/A;  . COLONOSCOPY WITH PROPOFOL N/A 04/12/2019   Procedure: COLONOSCOPY WITH BIOPSY;  Surgeon: Lucilla Lame, MD;  Location: [place];  Service: Endoscopy;  Laterality: N/A;  pt would like an early appt  . ct guided lung bx    . IR IMAGING GUIDED PORT INSERTION  12/18/2020  . POLYPECTOMY  01/22/2016   Procedure: POLYPECTOMY;  Surgeon: Lucilla Lame, MD;  Location: [place];  Service: Endoscopy;;  . POLYPECTOMY N/A 04/12/2019   Procedure: POLYPECTOMY;  Surgeon: Lucilla Lame, MD;  Location: [place];  Service: Endoscopy;  Laterality: N/A;  . VIDEO BRONCHOSCOPY WITH ENDOBRONCHIAL NAVIGATION N/A 11/21/2020   Procedure: VIDEO BRONCHOSCOPY WITH ENDOBRONCHIAL NAVIGATION;  Surgeon: Ottie Glazier, MD;  Location: [facility];  Service: Thoracic;  Laterality: N/A;  . VIDEO BRONCHOSCOPY WITH ENDOBRONCHIAL ULTRASOUND N/A 11/21/2020   Procedure: VIDEO BRONCHOSCOPY WITH ENDOBRONCHIAL ULTRASOUND;  Surgeon: Ottie Glazier, MD;  Location: [facility];  Service: Thoracic;  Laterality: N/A;    Social History   Socioeconomic History  . Marital status: Married    Spouse name: Not on file  . Number of children: Not on file  . Years of education: Not on file  . Highest education level: Not on file  Occupational History  . Not on file  Tobacco Use  . Smoking status: Former Smoker    Packs/day: 0.50    Years: 50.00    Pack years: 25.00    Types: Cigarettes    Quit date: 11/03/2020    Years since quitting: 0.1  . Smokeless tobacco: Never Used  Vaping Use  . Vaping Use: Some days  Substance and Sexual Activity  . Alcohol use: No  . Drug use: Never  . Sexual activity: Not on file  Other Topics Concern  . Not on file  Social History Narrative   . Not on file   Social Determinants of Health   Financial Resource Strain: Not on file  Food Insecurity: Not on file  Transportation Needs: Not on file  Physical Activity: Not on file  Stress: Not on file  Social Connections: Not on file  Intimate Partner Violence: Not on file    Family History  Problem Relation Age of Onset  . Alzheimer's disease Mother   . Heart attack Mother   . High Cholesterol Mother   . Hypertension Mother   . Heart block Mother   . Lung cancer Father   . Uterine cancer Sister   . Hypertension Sister   . Heart block Sister   . Anxiety disorder Sister   . Breast cancer Neg Hx      Current Outpatient Medications:  .  albuterol (VENTOLIN HFA) 108 (90 Base) MCG/ACT inhaler, Inhale 2 puffs into the lungs 2 (two) times daily., Disp: , Rfl:  .  amLODipine (NORVASC) 5 MG tablet, Take 5 mg by mouth at bedtime., Disp: , Rfl:  .  atorvastatin (LIPITOR) 10 MG tablet, TAKE 1 TABLET BY MOUTH  DAILY (Patient taking differently: Take 10 mg by mouth at bedtime.), Disp: 90 tablet, Rfl: 0 .  dexamethasone (DECADRON) 4 MG tablet, Take 1 tablet (4 mg total) by mouth daily., Disp: 30 tablet, Rfl: 0 .  lansoprazole (PREVACID) 30 MG capsule, Take 1 capsule (30 mg total) by mouth daily., Disp: 30 capsule, Rfl: 0 .  levothyroxine (SYNTHROID, LEVOTHROID) 50 MCG tablet, Take 50 mcg by mouth daily before breakfast., Disp: , Rfl:  .  lidocaine-prilocaine (EMLA) cream, Apply to affected area once, Disp: 30 g, Rfl: 3 .  loratadine (CLARITIN) 10 MG tablet, Take 10 mg by mouth daily. Reported on 01/22/2016, Disp: , Rfl:  .  LORazepam (ATIVAN) 0.5 MG tablet, Take 1 tablet (0.5 mg total) by mouth every 6 (six) hours as needed for anxiety., Disp: 120 tablet, Rfl: 0 .  losartan-hydrochlorothiazide (HYZAAR) 100-25 MG tablet, Take 1 tablet by mouth daily., Disp: , Rfl:  .  metoprolol succinate (TOPROL-XL) 50 MG 24 hr tablet, Take 50 mg by mouth daily., Disp: , Rfl:  .  ondansetron (ZOFRAN) 8  MG tablet, Take 1 tablet (8 mg total) by mouth 2 (two) times daily as needed for refractory nausea / vomiting. Start on day 3 after carboplatin chemo. (Patient not taking: Reported on 01/01/2021), Disp: 30 tablet, Rfl: 1 .  PARoxetine (PAXIL) 10 MG tablet, Take 5 mg by mouth every evening. (Patient not taking: Reported on 01/01/2021), Disp: , Rfl:  .  potassium chloride SA (KLOR-CON) 20 MEQ tablet, Take 1 tablet (20 mEq total) by mouth daily., Disp: 14 tablet, Rfl: 0 .  prochlorperazine (COMPAZINE) 10 MG tablet, Take 1 tablet (10 mg total) by mouth every 6 (six) hours as needed (Nausea or vomiting). (Patient not taking: Reported on 01/01/2021), Disp: 30 tablet, Rfl: 1  Physical exam:  Vitals:   01/01/21 1034  BP: (!) 146/69  Pulse: 78  Resp: 18  Temp: (!) 97.2 F (36.2 C)  TempSrc: Tympanic  SpO2: 100%  Weight: 145 lb 12.8 oz (66.1 kg)   Physical Exam Constitutional:      General: She is not in acute distress. Cardiovascular:     Rate and Rhythm: Normal rate and regular rhythm.     Heart sounds: Normal heart sounds.  Pulmonary:     Effort: Pulmonary effort is normal.     Breath sounds: Normal breath sounds.  Abdominal:     General: Bowel sounds are normal.     Palpations: Abdomen is soft.  Skin:    General: Skin is warm and dry.  Neurological:     Mental Status: She is alert and oriented to person, place, and time.      CMP Latest Ref Rng & Units 01/01/2021  Glucose 70 - 99 mg/dL 105(H)  BUN 8 - 23 mg/dL 19  Creatinine 0.44 - 1.00 mg/dL 0.77  Sodium 135 - 145 mmol/L 127(L)  Potassium 3.5 - 5.1 mmol/L 3.0(L)  Chloride 98 - 111 mmol/L 89(L)  CO2 22 - 32 mmol/L 28  Calcium 8.9 - 10.3 mg/dL 8.9  Total Protein 6.5 - 8.1 g/dL 6.3(L)  Total Bilirubin 0.3 - 1.2 mg/dL 1.0  Alkaline Phos 38 - 126 U/L 69  AST 15 - 41 U/L 16  ALT 0 - 44 U/L 20   CBC Latest Ref Rng & Units 01/01/2021  WBC 4.0 - 10.5 K/uL 0.8(LL)  Hemoglobin 12.0 - 15.0 g/dL 10.3(L)  Hematocrit 36.0 - 46.0 %  29.0(L)  Platelets 150 - 400 K/uL 66(L)    No  images are attached to the encounter.  MR Brain W Wo Contrast  Addendum Date: 12/25/2020   ADDENDUM REPORT: 12/25/2020 08:54 ADDENDUM: CORRECTION: The left paramidline cerebellar lesion is 5 mm, not 5 cm. Addendum was discussed with Dr. Janese Banks at 5:30 a.m. on 12/25/2020 via telephone. Electronically Signed   By: Margaretha Sheffield MD   On: 12/25/2020 08:54   Result Date: 12/25/2020 CLINICAL DATA:  Small cell lung cancer staging. EXAM: MRI HEAD WITHOUT AND WITH CONTRAST TECHNIQUE: Multiplanar, multiecho pulse sequences of the brain and surrounding structures were obtained without and with intravenous contrast. CONTRAST:  64m GADAVIST GADOBUTROL 1 MMOL/ML IV SOLN COMPARISON:  None. FINDINGS: Brain: No acute infarct. No hydrocephalus. No midline shift. Basal cisterns are patent. No extra-axial fluid collection. No acute hemorrhage. Multiple heterogeneous, cystic and peripherally enhancing lesions that are compatible with metastatic disease, including: - 1 cm lesion in the lateral right cerebellum (series 18, image 52). - 5 cm lesion in the left paramidline cerebellum (series 18, image 59). - 1 cm lesion in the right parieto-occipital region along the parieto-occipital sulcus (series 18, image 109). Mild edema associated with these lesions, most conspicuous with the right parieto-occipital lesion. No substantial mass effect. Additional mild T2/FLAIR hyperintensities within the white matter and pons are nonenhancing and likely relate to mild for age chronic microvascular ischemic disease. Vascular: Major arterial flow voids are maintained at the skull base. Skull and upper cervical spine: Normal marrow signal. Sinuses/Orbits: Clear sinuses.  Unremarkable orbits. Other: No mastoid effusions. IMPRESSION: Positive for infratentorial and supratentorial metastatic disease with lesions detailed above. Mild associated edema without substantial mass effect. Electronically Signed:  By: FMargaretha SheffieldMD On: 12/25/2020 08:18   CT Biopsy  Result Date: 12/18/2020 INDICATION: Recent diagnosis of lung cancer, now with hypermetabolic lesion involving the right ilium. Please performed CT-guided biopsy for tissue diagnostic purposes. EXAM: CT GUIDED BIOPSY OF HYPERMETABOLIC RIGHT ILIAC LESION. MEDICATIONS: None COMPARISON:  PET-CT-11/27/2020 ANESTHESIA/SEDATION: Fentanyl 50 mcg IV; Versed 1 mg IV Sedation time: 10 minutes; The patient was continuously monitored during the procedure by the interventional radiology nurse under my direct supervision. COMPLICATIONS: None immediate. PROCEDURE: Informed consent was obtained from the patient following an explanation of the procedure, risks, benefits and alternatives. The patient understands, agrees and consents for the procedure. All questions were addressed. A time out was performed prior to the initiation of the procedure. The patient was positioned prone on the CT table and a limited CT was performed for procedural planning demonstrating an ill-defined approximately 2.5 x 2.0 cm lytic lesion involving the right ilium adjacent to the anterior aspect of the right SI joint (image 18, series 2), correlating with the hypermetabolic lesion seen on preceding PET-CT image 213, series 603. The procedure was planned. The operative site was prepped and draped in the usual sterile fashion. Appropriate trajectory was confirmed with a 22 gauge spinal needle after the adjacent tissues were anesthetized with 1% Lidocaine with epinephrine. Next, an 11 gauge coaxial bone biopsy needle was advanced into the right ilium adjacent to the posterior aspect of the ill-defined lytic lesion. Needle position was confirmed with CT imaging (image 8, series 5). Initially, 2 bone biopsies were obtained with the inner 13 gauge biopsy device. Again, appropriate position was confirmed with CT imaging (image 8, series 6). Next, a final bone biopsy was obtained with the 11 gauge outer  bone biopsy device. The needle was removed and superficial hemostasis was obtained with manual compression. A dressing was applied. The patient tolerated  the procedure well without immediate post procedural complication. IMPRESSION: Technically successful CT guided core biopsy of hypermetabolic right iliac bone lesion. Electronically Signed   By: Sandi Mariscal M.D.   On: 12/18/2020 11:07   CT BIOPSY  Result Date: 12/04/2020 INDICATION: 75 year old woman with FDG avid left lower lobe lung mass presents to interventional radiology for CT-guided biopsy. EXAM: CT-guided biopsy of left lower lobe mass MEDICATIONS: None. ANESTHESIA/SEDATION: Moderate (conscious) sedation was employed during this procedure. A total of Versed 1 mg and Fentanyl 50 mcg was administered intravenously. Moderate Sedation Time: 19 minutes. The patient's level of consciousness and vital signs were monitored continuously by radiology nursing throughout the procedure under my direct supervision. COMPLICATIONS: None immediate. PROCEDURE: Informed written consent was obtained from the patient after a thorough discussion of the procedural risks, benefits and alternatives. All questions were addressed. Maximal Sterile Barrier Technique was utilized including caps, mask, sterile gowns, sterile gloves, sterile drape, hand hygiene and skin antiseptic. A timeout was performed prior to the initiation of the procedure. Patient position supine on the CT table. Left lateral chest wall skin prepped and draped in usual sterile fashion. Following local lidocaine administration, 17 gauge introducer needle was advanced into the left lower lobe lung mass utilizing CT guidance. Three cores were obtained from the left lower lobe lung mass and sent to pathology in formalin. Needle removed and hemostasis achieved with manual compression. IMPRESSION: CT-guided biopsy of left lower lobe lung mass as above. Electronically Signed   By: Miachel Roux M.D.   On: 12/04/2020  14:10   DG Chest Port 1 View  Result Date: 12/04/2020 CLINICAL DATA:  75 year old woman status post left lower lobe lung mass biopsy. EXAM: PORTABLE CHEST 1 VIEW COMPARISON:  Chest radiograph 06/05/2013 FINDINGS: Cardiomediastinal silhouette and pulmonary vasculature are within normal limits. Left lower lobe lung mass again seen. Haziness around the mass likely due to prior lesional hemorrhage. No pneumothorax identified. Atherosclerotic changes of the thoracic aorta again seen. IMPRESSION: No pneumothorax status post left lower lobe lung mass biopsy. Electronically Signed   By: Miachel Roux M.D.   On: 12/04/2020 14:12   IR IMAGING GUIDED PORT INSERTION  Result Date: 12/18/2020 INDICATION: Poor venous access. In need of durable intravenous access for chemotherapy administration. EXAM: IMPLANTED PORT A CATH PLACEMENT WITH ULTRASOUND AND FLUOROSCOPIC GUIDANCE COMPARISON:  PET-CT-11/27/2020 MEDICATIONS: None ANESTHESIA/SEDATION: Moderate (conscious) sedation was employed during this procedure. A total of Versed 1 mg and Fentanyl 50 mcg was administered intravenously. Moderate Sedation Time: 22 minutes. The patient's level of consciousness and vital signs were monitored continuously by radiology nursing throughout the procedure under my direct supervision. CONTRAST:  None FLUOROSCOPY TIME:  1 minute (3.6 mGy) COMPLICATIONS: None immediate. PROCEDURE: The procedure, risks, benefits, and alternatives were explained to the patient. Questions regarding the procedure were encouraged and answered. The patient understands and consents to the procedure. The right neck and chest were prepped with chlorhexidine in a sterile fashion, and a sterile drape was applied covering the operative field. Maximum barrier sterile technique with sterile gowns and gloves were used for the procedure. A timeout was performed prior to the initiation of the procedure. Local anesthesia was provided with 1% lidocaine with epinephrine. After  creating a small venotomy incision, a micropuncture kit was utilized to access the internal jugular vein. Real-time ultrasound guidance was utilized for vascular access including the acquisition of a permanent ultrasound image documenting patency of the accessed vessel. The microwire was utilized to measure appropriate catheter length. A  subcutaneous port pocket was then created along the upper chest wall utilizing a combination of sharp and blunt dissection. The pocket was irrigated with sterile saline. A single lumen "standard sized" power injectable port was chosen for placement. The 8 Fr catheter was tunneled from the port pocket site to the venotomy incision. The port was placed in the pocket. The external catheter was trimmed to appropriate length. At the venotomy, an 8 Fr peel-away sheath was placed over a guidewire under fluoroscopic guidance. The catheter was then placed through the sheath and the sheath was removed. Final catheter positioning was confirmed and documented with a fluoroscopic spot radiograph. The port was accessed with a Huber needle, aspirated and flushed with heparinized saline. The venotomy site was closed with an interrupted 4-0 Vicryl suture. The port pocket incision was closed with interrupted 2-0 Vicryl suture. Dermabond and Steri-strips were applied to both incisions. Dressings were applied. The patient tolerated the procedure well without immediate post procedural complication. FINDINGS: After catheter placement, the tip lies within the superior cavoatrial junction. The catheter aspirates and flushes normally and is ready for immediate use. IMPRESSION: Successful placement of a right internal jugular approach power injectable Port-A-Cath. The catheter is ready for immediate use. Electronically Signed   By: Sandi Mariscal M.D.   On: 12/18/2020 14:14     Assessment and plan- Patient is a 75 y.o. female with extensive stage small cell lung cancer or brain metastases here for toxicity  check after cycle 1 of carbo etoposide Tecentriq chemotherapy  Patient is tolerated cycle 1 of carbo etoposide Tecentriq chemotherapy today.  Given that she is going through whole brain radiation treatment and is on a steroid taper we will plan to push out her chemotherapy By 1 more week and she will start treatment with cycle 2 on 01/20/2021 when she will see covering MD/NP on that day.   Bone metastases: She has a dental extraction coming up the week after radiation is done.  I will plan to start Zometa with cycle 3 of carbo etoposide Tecentriq chemotherapy  Hyponatremia/hypokalemia: She will receive 1 L of IV fluids today along with 20 mEq of IV potassium.  We have also sent a prescription for oral potassium.  We will plan to give her IV fluids on a weekly basis as well.  I have asked her to increase the intake of electrolytes and sodium in her diet.  Of note her hyponatremia has been chronic even prior to the diagnosis of lung cancer   Visit Diagnosis 1. Antineoplastic chemotherapy induced pancytopenia (CODE) ([place])   2. Hyponatremia   3. Hypokalemia      Dr. Randa Evens, MD, MPH [facility] [phone] 01/01/2021 4:55 PM

## 2021-01-02 ENCOUNTER — Ambulatory Visit
Admission: RE | Admit: 2021-01-02 | Discharge: 2021-01-02 | Disposition: A | Discharge status: Home / Self Care | Payer: Medicare Other | Source: Ambulatory Visit | Attending: Radiation Oncology | Admitting: Radiation Oncology

## 2021-01-02 DIAGNOSIS — Z510 Encounter for antineoplastic radiation therapy: Secondary | ICD-10-CM | POA: Diagnosis not present

## 2021-01-02 LAB — T4: T4, Total: 8.3 ug/dL (ref 4.5–12.0)

## 2021-01-05 ENCOUNTER — Ambulatory Visit
Admission: RE | Admit: 2021-01-05 | Discharge: 2021-01-05 | Disposition: A | Discharge status: Home / Self Care | Payer: Medicare Other | Source: Ambulatory Visit | Attending: Radiation Oncology | Admitting: Radiation Oncology

## 2021-01-05 ENCOUNTER — Other Ambulatory Visit: Payer: Self-pay | Admitting: Adult Health

## 2021-01-05 DIAGNOSIS — Z510 Encounter for antineoplastic radiation therapy: Secondary | ICD-10-CM | POA: Diagnosis not present

## 2021-01-06 ENCOUNTER — Ambulatory Visit
Admission: RE | Admit: 2021-01-06 | Discharge: 2021-01-06 | Disposition: A | Discharge status: Home / Self Care | Payer: Medicare Other | Source: Ambulatory Visit | Attending: Radiation Oncology | Admitting: Radiation Oncology

## 2021-01-06 ENCOUNTER — Inpatient Hospital Stay: Payer: Medicare Other

## 2021-01-06 VITALS — BP 138/58 | HR 62 | Temp 97.8°F | Resp 18

## 2021-01-06 DIAGNOSIS — Z510 Encounter for antineoplastic radiation therapy: Secondary | ICD-10-CM | POA: Diagnosis not present

## 2021-01-06 DIAGNOSIS — C3490 Malignant neoplasm of unspecified part of unspecified bronchus or lung: Secondary | ICD-10-CM

## 2021-01-06 DIAGNOSIS — E860 Dehydration: Secondary | ICD-10-CM

## 2021-01-06 LAB — CBC WITH DIFFERENTIAL/PLATELET
Abs Immature Granulocytes: 16.4 10*3/uL — ABNORMAL HIGH (ref 0.00–0.07)
Band Neutrophils: 1 %
Basophils Absolute: 0 10*3/uL (ref 0.0–0.1)
Basophils Relative: 0 %
Blasts: 1 %
Eosinophils Absolute: 0 10*3/uL (ref 0.0–0.5)
Eosinophils Relative: 0 %
HCT: 28.5 % — ABNORMAL LOW (ref 36.0–46.0)
Hemoglobin: 10.2 g/dL — ABNORMAL LOW (ref 12.0–15.0)
Lymphocytes Relative: 7 %
Lymphs Abs: 4.3 10*3/uL — ABNORMAL HIGH (ref 0.7–4.0)
MCH: 32.5 pg (ref 26.0–34.0)
MCHC: 35.8 g/dL (ref 30.0–36.0)
MCV: 90.8 fL (ref 80.0–100.0)
Metamyelocytes Relative: 6 %
Monocytes Absolute: 4.3 10*3/uL — ABNORMAL HIGH (ref 0.1–1.0)
Monocytes Relative: 7 %
Myelocytes: 16 %
Neutro Abs: 35.3 10*3/uL — ABNORMAL HIGH (ref 1.7–7.7)
Neutrophils Relative %: 57 %
Platelets: 168 10*3/uL (ref 150–400)
Promyelocytes Relative: 5 %
RBC: 3.14 MIL/uL — ABNORMAL LOW (ref 3.87–5.11)
RDW: 13.1 % (ref 11.5–15.5)
Smear Review: NORMAL
WBC: 60.9 10*3/uL (ref 4.0–10.5)
nRBC: 0.1 % (ref 0.0–0.2)

## 2021-01-06 LAB — COMPREHENSIVE METABOLIC PANEL
ALT: 19 U/L (ref 0–44)
AST: 22 U/L (ref 15–41)
Albumin: 3.7 g/dL (ref 3.5–5.0)
Alkaline Phosphatase: 124 U/L (ref 38–126)
Anion gap: 13 (ref 5–15)
BUN: 15 mg/dL (ref 8–23)
CO2: 22 mmol/L (ref 22–32)
Calcium: 8.8 mg/dL — ABNORMAL LOW (ref 8.9–10.3)
Chloride: 86 mmol/L — ABNORMAL LOW (ref 98–111)
Creatinine, Ser: 0.88 mg/dL (ref 0.44–1.00)
GFR, Estimated: >60 mL/min (ref >60)
Glucose, Bld: 104 mg/dL — ABNORMAL HIGH (ref 70–99)
Potassium: 4.4 mmol/L (ref 3.5–5.1)
Sodium: 121 mmol/L — ABNORMAL LOW (ref 135–145)
Total Bilirubin: 0.7 mg/dL (ref 0.3–1.2)
Total Protein: 6.5 g/dL (ref 6.5–8.1)

## 2021-01-06 MED ORDER — SODIUM CHLORIDE 0.9 % IV SOLN
Freq: Once | INTRAVENOUS | Status: AC
  Filled 2021-01-06: qty 250

## 2021-01-06 MED ORDER — SODIUM CHLORIDE 0.9% FLUSH
10.0000 mL | INTRAVENOUS | Status: DC | PRN
  Administered 2021-01-06: 10 mL via INTRAVENOUS
  Filled 2021-01-06: qty 10

## 2021-01-06 MED ORDER — HEPARIN SOD (PORK) LOCK FLUSH 100 UNIT/ML IV SOLN
500.0000 [IU] | Freq: Once | INTRAVENOUS | Status: AC
  Administered 2021-01-06: 500 [IU] via INTRAVENOUS
  Filled 2021-01-06: qty 5

## 2021-01-06 NOTE — Progress Notes (Signed)
Reviewed Lab results with patient. 1 liter of NS over 1 hour was given today. Discharged from fluid clinic. Pt going for her radiation treatment now.

## 2021-01-07 ENCOUNTER — Ambulatory Visit
Admission: RE | Admit: 2021-01-07 | Discharge: 2021-01-07 | Disposition: A | Discharge status: Home / Self Care | Payer: Medicare Other | Source: Ambulatory Visit | Attending: Radiation Oncology | Admitting: Radiation Oncology

## 2021-01-07 DIAGNOSIS — Z510 Encounter for antineoplastic radiation therapy: Secondary | ICD-10-CM | POA: Diagnosis not present

## 2021-01-08 ENCOUNTER — Ambulatory Visit
Admission: RE | Admit: 2021-01-08 | Discharge: 2021-01-08 | Disposition: A | Discharge status: Home / Self Care | Payer: Medicare Other | Source: Ambulatory Visit | Attending: Radiation Oncology | Admitting: Radiation Oncology

## 2021-01-08 DIAGNOSIS — Z510 Encounter for antineoplastic radiation therapy: Secondary | ICD-10-CM | POA: Diagnosis not present

## 2021-01-09 ENCOUNTER — Ambulatory Visit
Admission: RE | Admit: 2021-01-09 | Discharge: 2021-01-09 | Disposition: A | Discharge status: Home / Self Care | Payer: Medicare Other | Source: Ambulatory Visit | Attending: Radiation Oncology | Admitting: Radiation Oncology

## 2021-01-09 DIAGNOSIS — Z510 Encounter for antineoplastic radiation therapy: Secondary | ICD-10-CM | POA: Diagnosis not present

## 2021-01-12 ENCOUNTER — Other Ambulatory Visit: Payer: Medicare Other

## 2021-01-12 ENCOUNTER — Ambulatory Visit: Payer: Medicare Other

## 2021-01-12 ENCOUNTER — Ambulatory Visit: Payer: Medicare Other | Admitting: Oncology

## 2021-01-13 ENCOUNTER — Ambulatory Visit: Payer: Medicare Other

## 2021-01-13 ENCOUNTER — Encounter: Payer: Self-pay | Admitting: Oncology

## 2021-01-14 ENCOUNTER — Ambulatory Visit: Payer: Medicare Other

## 2021-01-15 ENCOUNTER — Ambulatory Visit: Payer: Medicare Other | Admitting: Internal Medicine

## 2021-01-15 ENCOUNTER — Ambulatory Visit: Payer: Medicare Other

## 2021-01-15 ENCOUNTER — Other Ambulatory Visit: Payer: Medicare Other

## 2021-01-19 ENCOUNTER — Other Ambulatory Visit: Payer: Self-pay | Admitting: Radiation Oncology

## 2021-01-20 ENCOUNTER — Inpatient Hospital Stay: Payer: Medicare Other

## 2021-01-20 ENCOUNTER — Other Ambulatory Visit: Payer: Self-pay

## 2021-01-20 ENCOUNTER — Inpatient Hospital Stay (HOSPITAL_BASED_OUTPATIENT_CLINIC_OR_DEPARTMENT_OTHER): Payer: Medicare Other | Admitting: Oncology

## 2021-01-20 ENCOUNTER — Telehealth: Payer: Self-pay

## 2021-01-20 ENCOUNTER — Encounter: Payer: Self-pay | Admitting: Oncology

## 2021-01-20 VITALS — BP 115/61 | HR 62 | Temp 99.5°F | Resp 18 | Wt 139.0 lb

## 2021-01-20 DIAGNOSIS — C3490 Malignant neoplasm of unspecified part of unspecified bronchus or lung: Secondary | ICD-10-CM

## 2021-01-20 DIAGNOSIS — C7951 Secondary malignant neoplasm of bone: Secondary | ICD-10-CM | POA: Diagnosis not present

## 2021-01-20 DIAGNOSIS — Z5111 Encounter for antineoplastic chemotherapy: Secondary | ICD-10-CM

## 2021-01-20 DIAGNOSIS — D61810 Antineoplastic chemotherapy induced pancytopenia: Secondary | ICD-10-CM

## 2021-01-20 DIAGNOSIS — E871 Hypo-osmolality and hyponatremia: Secondary | ICD-10-CM | POA: Diagnosis not present

## 2021-01-20 DIAGNOSIS — E876 Hypokalemia: Secondary | ICD-10-CM

## 2021-01-20 DIAGNOSIS — Z510 Encounter for antineoplastic radiation therapy: Secondary | ICD-10-CM | POA: Diagnosis not present

## 2021-01-20 LAB — CBC WITH DIFFERENTIAL/PLATELET
Abs Immature Granulocytes: 0.51 10*3/uL — ABNORMAL HIGH (ref 0.00–0.07)
Basophils Absolute: 0 10*3/uL (ref 0.0–0.1)
Basophils Relative: 0 %
Eosinophils Absolute: 0 10*3/uL (ref 0.0–0.5)
Eosinophils Relative: 0 %
HCT: 28.1 % — ABNORMAL LOW (ref 36.0–46.0)
Hemoglobin: 10.4 g/dL — ABNORMAL LOW (ref 12.0–15.0)
Immature Granulocytes: 5 %
Lymphocytes Relative: 15 %
Lymphs Abs: 1.7 10*3/uL (ref 0.7–4.0)
MCH: 32.5 pg (ref 26.0–34.0)
MCHC: 37 g/dL — ABNORMAL HIGH (ref 30.0–36.0)
MCV: 87.8 fL (ref 80.0–100.0)
Monocytes Absolute: 1.2 10*3/uL — ABNORMAL HIGH (ref 0.1–1.0)
Monocytes Relative: 11 %
Neutro Abs: 7.8 10*3/uL — ABNORMAL HIGH (ref 1.7–7.7)
Neutrophils Relative %: 69 %
Platelets: 150 10*3/uL (ref 150–400)
RBC: 3.2 MIL/uL — ABNORMAL LOW (ref 3.87–5.11)
RDW: 13.5 % (ref 11.5–15.5)
WBC: 11.2 10*3/uL — ABNORMAL HIGH (ref 4.0–10.5)
nRBC: 0 % (ref 0.0–0.2)

## 2021-01-20 LAB — COMPREHENSIVE METABOLIC PANEL
ALT: 17 U/L (ref 0–44)
AST: 17 U/L (ref 15–41)
Albumin: 3.5 g/dL (ref 3.5–5.0)
Alkaline Phosphatase: 56 U/L (ref 38–126)
Anion gap: 11 (ref 5–15)
BUN: 21 mg/dL (ref 8–23)
CO2: 26 mmol/L (ref 22–32)
Calcium: 8.7 mg/dL — ABNORMAL LOW (ref 8.9–10.3)
Chloride: 83 mmol/L — ABNORMAL LOW (ref 98–111)
Creatinine, Ser: 1.06 mg/dL — ABNORMAL HIGH (ref 0.44–1.00)
GFR, Estimated: 55 mL/min — ABNORMAL LOW (ref >60)
Glucose, Bld: 126 mg/dL — ABNORMAL HIGH (ref 70–99)
Potassium: 2.8 mmol/L — ABNORMAL LOW (ref 3.5–5.1)
Sodium: 120 mmol/L — ABNORMAL LOW (ref 135–145)
Total Bilirubin: 1.2 mg/dL (ref 0.3–1.2)
Total Protein: 6 g/dL — ABNORMAL LOW (ref 6.5–8.1)

## 2021-01-20 LAB — MAGNESIUM: Magnesium: 1.5 mg/dL — ABNORMAL LOW (ref 1.7–2.4)

## 2021-01-20 LAB — TSH: TSH: 0.149 u[IU]/mL — ABNORMAL LOW (ref 0.350–4.500)

## 2021-01-20 MED ORDER — SODIUM CHLORIDE 0.9 % IV SOLN
100.0000 mg/m2 | Freq: Once | INTRAVENOUS | Status: AC
  Administered 2021-01-20: 180 mg via INTRAVENOUS
  Filled 2021-01-20: qty 9

## 2021-01-20 MED ORDER — SODIUM CHLORIDE 0.9 % IV SOLN
Freq: Once | INTRAVENOUS | Status: AC
  Filled 2021-01-20: qty 250

## 2021-01-20 MED ORDER — SODIUM CHLORIDE 0.9 % IV SOLN
10.0000 mg | Freq: Once | INTRAVENOUS | Status: AC
  Administered 2021-01-20: 10 mg via INTRAVENOUS
  Filled 2021-01-20: qty 10

## 2021-01-20 MED ORDER — PALONOSETRON HCL INJECTION 0.25 MG/5ML
0.2500 mg | Freq: Once | INTRAVENOUS | Status: AC
Start: 2021-01-20 — End: 2021-01-20
  Administered 2021-01-20: 0.25 mg via INTRAVENOUS
  Filled 2021-01-20: qty 5

## 2021-01-20 MED ORDER — MAGNESIUM CHLORIDE-CALCIUM 64-106 MG PO TBEC: 1.0000 | DELAYED_RELEASE_TABLET | Freq: Every day | ORAL | 0 refills | Status: DC

## 2021-01-20 MED ORDER — HEPARIN SOD (PORK) LOCK FLUSH 100 UNIT/ML IV SOLN
INTRAVENOUS | Status: AC
  Filled 2021-01-20: qty 5

## 2021-01-20 MED ORDER — SODIUM CHLORIDE 0.9 % IV SOLN
380.0000 mg | Freq: Once | INTRAVENOUS | Status: AC
  Administered 2021-01-20: 380 mg via INTRAVENOUS
  Filled 2021-01-20: qty 38

## 2021-01-20 MED ORDER — SODIUM CHLORIDE 0.9 % IV SOLN
1200.0000 mg | Freq: Once | INTRAVENOUS | Status: AC
  Administered 2021-01-20: 1200 mg via INTRAVENOUS
  Filled 2021-01-20: qty 20

## 2021-01-20 MED ORDER — POTASSIUM CHLORIDE IN NACL 20-0.9 MEQ/L-% IV SOLN
Freq: Once | INTRAVENOUS | Status: AC
Start: 2021-01-20 — End: 2021-01-20
  Filled 2021-01-20: qty 1000

## 2021-01-20 MED ORDER — SODIUM CHLORIDE 0.9% FLUSH
10.0000 mL | Freq: Once | INTRAVENOUS | Status: AC
Start: 2021-01-20 — End: 2021-01-20
  Administered 2021-01-20: 10 mL via INTRAVENOUS
  Filled 2021-01-20: qty 10

## 2021-01-20 MED ORDER — POTASSIUM CHLORIDE CRYS ER 20 MEQ PO TBCR: 20.0000 meq | EXTENDED_RELEASE_TABLET | Freq: Every day | ORAL | 0 refills | Status: DC

## 2021-01-20 MED ORDER — HEPARIN SOD (PORK) LOCK FLUSH 100 UNIT/ML IV SOLN
500.0000 [IU] | Freq: Once | INTRAVENOUS | Status: AC
  Administered 2021-01-20: 500 [IU] via INTRAVENOUS
  Filled 2021-01-20: qty 5

## 2021-01-20 MED ORDER — SODIUM CHLORIDE 1 G PO TABS: 1.0000 g | ORAL_TABLET | Freq: Three times a day (TID) | ORAL | 0 refills | Status: DC

## 2021-01-20 MED ORDER — SODIUM CHLORIDE 0.9 % IV SOLN
150.0000 mg | Freq: Once | INTRAVENOUS | Status: AC
  Administered 2021-01-20: 150 mg via INTRAVENOUS
  Filled 2021-01-20: qty 150

## 2021-01-20 NOTE — Telephone Encounter (Signed)
Per secure chat from Dr. Tasia Catchings: Magnesium came back at 1.5, please let patient know that I will send her prescription of magnesium supplementation.  Added magnesium to next week's blood work and also add possible IV magnesium.  Thank you  Patient has been notified and will pick up tomorrow.

## 2021-01-20 NOTE — Progress Notes (Signed)
Patient here for follow up. Pt reports that she completed radiation on 5/20, but continued taking Dexamethasone until yesterday and she does not know if this will interfere with chemo. Pt also reports having very little appetite due to metalic taste in mouth and is wondering if Marinol will help. Pt has had 6 pound weight loss since last visit in March.

## 2021-01-20 NOTE — Progress Notes (Signed)
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 She underwent CT chest with contrast in March 2022 following an abnormal x-ray which showed a 2.6 cm mass in the left lower lobe. This was followed by a PET CT scan which showed a 3.0 x 2.0 cm mass in the left lower lobe of the lung with an SUV of 9.7. No evidence of locoregional adenopathy. No evidence of intra-abdominal disease with patient was noted to have a hypermetabolic focus in the right iliac bone with an SUV of 11.1 which was suspicious for metastatic disease. 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 Cells were positive for TTF-1, CD56 and chromogranin.  Right iliac bone biopsy was done and results were consistent with small cell lung cancer  Patient received 1 dose of carbo etoposide Tecentriq chemotherapy and following that she had an MRI brain which showedAt least 3 distinct 0.5 to 1 cm lesions in the right parieto-occipital region as well as cerebellum with mild associated edema.  Plan is therefore to hold off on chemotherapy until she completes whole brain radiation and steroid taper   INTERVAL HISTORY Beth Mcmillan is a 75 y.o. female who has above history reviewed by me today presents for  follow up visit for management of small cell lung cancer Patient follows up with Dr.Rao who is off today, I am covering her to see this patient. 12/29/20-01/09/2021 whole brain Radiation Patient recently had a dental extraction and was recommended by dentist and not to start a bisphosphonate until June. She reports tolerating radiation well.  Patient has been on dexamethasone 4 mg daily and took last dose yesterday. Patient says that she may have misunderstood the instruction of taking just 3 days after she finished the last dose of radiation. No nausea vomiting diarrhea Some fatigue. Weight loss 6 pounds since last visit in March 2022. Daughter wants to see if Marinol may help patient's appetite.   ECOG PS- 1 Pain scale- 0 Opioid associated constipation- no  Review of systems- Review of Systems  Constitutional: Positive for malaise/fatigue and weight loss. Negative for chills and fever.  HENT: Negative for congestion, ear discharge and nosebleeds.   Eyes: Negative for blurred vision.  Respiratory: Negative for cough, hemoptysis, sputum production, shortness of breath and wheezing.   Cardiovascular: Negative for chest pain, palpitations, orthopnea and claudication.  Gastrointestinal: Negative for abdominal pain, blood in stool, constipation, diarrhea, heartburn, melena, nausea and vomiting.  Genitourinary: Negative for dysuria, flank pain, frequency, hematuria and urgency.  Musculoskeletal: Negative for back pain, joint pain and myalgias.  Skin: Negative for rash.  Neurological: Negative for dizziness, tingling, focal weakness, seizures, weakness and headaches.  Endo/Heme/Allergies: Does not bruise/bleed easily.  Psychiatric/Behavioral: Negative for depression and suicidal ideas. The patient is not nervous/anxious and does not have insomnia.       Allergies  Allergen Reactions  .  Augmentin [Amoxicillin-Pot Clavulanate]     "messes up my blood cells"  . Codeine Nausea Only  . Levaquin  [Levofloxacin]     Affected lab results   . Tape Rash    Some bandaids cause rash sometimes     Past Medical History:  Diagnosis Date  . Anxiety   . Breathing problem    low breathing function 53%  . COPD (chronic obstructive pulmonary disease) (HCC)    EMPHYSEMA  . Dysrhythmia    tachycardia  . Emphysema of lung ([place])   . Hemorrhoid   . History of hiatal hernia    SMALL- 2022  . Hypercholesteremia   . Hypertension   . Hypothyroidism   . Larynx polyp   . Lung cancer ([place])   . Palpitations    with anxiety  . Platelets decreased ([place]) 2020  . Seasonal allergies   . Tinnitus   . Vertigo    no episodes in several years  . Wears dentures    partial lower  . White coat syndrome with hypertension      Past Surgical History:  Procedure Laterality Date  . ABDOMINAL HYSTERECTOMY     partial  . COLONOSCOPY WITH PROPOFOL N/A 01/22/2016   Procedure: COLONOSCOPY WITH PROPOFOL;  Surgeon: Lucilla Lame, MD;  Location: [place];  Service: Endoscopy;  Laterality: N/A;  . COLONOSCOPY WITH PROPOFOL N/A 04/12/2019   Procedure: COLONOSCOPY WITH BIOPSY;  Surgeon: Lucilla Lame, MD;  Location: [place];  Service: Endoscopy;  Laterality: N/A;  pt would like an early appt  . ct guided lung bx    . IR IMAGING GUIDED PORT INSERTION  12/18/2020  . POLYPECTOMY  01/22/2016   Procedure: POLYPECTOMY;  Surgeon: Lucilla Lame, MD;  Location: [place];  Service: Endoscopy;;  . POLYPECTOMY N/A 04/12/2019   Procedure: POLYPECTOMY;  Surgeon: Lucilla Lame, MD;  Location: [place];  Service: Endoscopy;  Laterality: N/A;  . VIDEO BRONCHOSCOPY WITH ENDOBRONCHIAL NAVIGATION N/A 11/21/2020   Procedure: VIDEO BRONCHOSCOPY WITH ENDOBRONCHIAL NAVIGATION;  Surgeon: Ottie Glazier, MD;  Location: [facility];  Service: Thoracic;  Laterality: N/A;  . VIDEO BRONCHOSCOPY WITH ENDOBRONCHIAL ULTRASOUND N/A 11/21/2020   Procedure: VIDEO BRONCHOSCOPY WITH ENDOBRONCHIAL ULTRASOUND;  Surgeon:  Ottie Glazier, MD;  Location: [facility];  Service: Thoracic;  Laterality: N/A;    Social History   Socioeconomic History  . Marital status: Married    Spouse name: Not on file  . Number of children: Not on file  . Years of education: Not on file  . Highest education level: Not on file  Occupational History  . Not on file  Tobacco Use  . Smoking status: Former Smoker    Packs/day: 0.50    Years: 50.00    Pack years: 25.00    Types: Cigarettes    Quit date: 11/03/2020    Years since quitting: 0.2  . Smokeless tobacco: Never Used  Vaping Use  . Vaping Use: Some days  Substance and Sexual Activity  . Alcohol use: No  . Drug use: Never  . Sexual activity: Not on file  Other Topics Concern  . Not on file  Social History Narrative  . Not on file   Social Determinants of Health   Financial Resource Strain: Not on file  Food Insecurity: Not on file  Transportation Needs: Not on file  Physical Activity: Not on file  Stress: Not on file  Social Connections: Not on file  Intimate Partner Violence: Not on file  Family History  Problem Relation Age of Onset  . Alzheimer's disease Mother   . Heart attack Mother   . High Cholesterol Mother   . Hypertension Mother   . Heart block Mother   . Lung cancer Father   . Uterine cancer Sister   . Hypertension Sister   . Heart block Sister   . Anxiety disorder Sister   . Breast cancer Neg Hx      Current Outpatient Medications:  .  albuterol (VENTOLIN HFA) 108 (90 Base) MCG/ACT inhaler, Inhale 2 puffs into the lungs 2 (two) times daily., Disp: , Rfl:  .  amLODipine (NORVASC) 5 MG tablet, Take 5 mg by mouth at bedtime., Disp: , Rfl:  .  atorvastatin (LIPITOR) 10 MG tablet, TAKE 1 TABLET BY MOUTH DAILY (Patient taking differently: Take 10 mg by mouth at bedtime.), Disp: 90 tablet, Rfl: 0 .  levothyroxine (SYNTHROID, LEVOTHROID) 50 MCG tablet, Take 50 mcg by mouth daily before breakfast., Disp: , Rfl:  .  lidocaine-prilocaine  (EMLA) cream, Apply to affected area once, Disp: 30 g, Rfl: 3 .  loratadine (CLARITIN) 10 MG tablet, Take 10 mg by mouth daily. Reported on 01/22/2016, Disp: , Rfl:  .  LORazepam (ATIVAN) 0.5 MG tablet, Take 1 tablet (0.5 mg total) by mouth every 6 (six) hours as needed for anxiety., Disp: 120 tablet, Rfl: 0 .  losartan-hydrochlorothiazide (HYZAAR) 100-25 MG tablet, Take 1 tablet by mouth daily., Disp: , Rfl:  .  metoprolol succinate (TOPROL-XL) 50 MG 24 hr tablet, Take 50 mg by mouth daily., Disp: , Rfl:  .  ondansetron (ZOFRAN) 8 MG tablet, Take 1 tablet (8 mg total) by mouth 2 (two) times daily as needed for refractory nausea / vomiting. Start on day 3 after carboplatin chemo., Disp: 30 tablet, Rfl: 1 .  PARoxetine (PAXIL) 10 MG tablet, Take 5 mg by mouth every evening., Disp: , Rfl:  .  prochlorperazine (COMPAZINE) 10 MG tablet, Take 1 tablet (10 mg total) by mouth every 6 (six) hours as needed (Nausea or vomiting)., Disp: 30 tablet, Rfl: 1 .  sodium chloride 1 g tablet, Take 1 tablet (1 g total) by mouth 3 (three) times daily., Disp: 42 tablet, Rfl: 0 .  dexamethasone (DECADRON) 4 MG tablet, Take 1 tablet (4 mg total) by mouth daily. (Patient not taking: Reported on 01/20/2021), Disp: 30 tablet, Rfl: 0 .  lansoprazole (PREVACID) 30 MG capsule, Take 1 capsule (30 mg total) by mouth daily. (Patient not taking: Reported on 01/20/2021), Disp: 30 capsule, Rfl: 0 .  potassium chloride SA (KLOR-CON) 20 MEQ tablet, Take 1 tablet (20 mEq total) by mouth daily., Disp: 28 tablet, Rfl: 0  Physical exam:  Vitals:   01/20/21 0842  BP: 115/61  Pulse: 62  Resp: 18  Temp: 99.5 F (37.5 C)  SpO2: 100%  Weight: 139 lb (63 kg)   Physical Exam Constitutional:      General: She is not in acute distress. Cardiovascular:     Rate and Rhythm: Normal rate and regular rhythm.     Heart sounds: Normal heart sounds.  Pulmonary:     Effort: Pulmonary effort is normal.     Breath sounds: Normal breath sounds.   Abdominal:     General: Bowel sounds are normal.     Palpations: Abdomen is soft.  Skin:    General: Skin is warm and dry.  Neurological:     Mental Status: She is alert and oriented to person, place, and time.  CMP Latest Ref Rng & Units 01/20/2021  Glucose 70 - 99 mg/dL 126(H)  BUN 8 - 23 mg/dL 21  Creatinine 0.44 - 1.00 mg/dL 1.06(H)  Sodium 135 - 145 mmol/L 120(L)  Potassium 3.5 - 5.1 mmol/L 2.8(L)  Chloride 98 - 111 mmol/L 83(L)  CO2 22 - 32 mmol/L 26  Calcium 8.9 - 10.3 mg/dL 8.7(L)  Total Protein 6.5 - 8.1 g/dL 6.0(L)  Total Bilirubin 0.3 - 1.2 mg/dL 1.2  Alkaline Phos 38 - 126 U/L 56  AST 15 - 41 U/L 17  ALT 0 - 44 U/L 17   CBC Latest Ref Rng & Units 01/20/2021  WBC 4.0 - 10.5 K/uL 11.2(H)  Hemoglobin 12.0 - 15.0 g/dL 10.4(L)  Hematocrit 36.0 - 46.0 % 28.1(L)  Platelets 150 - 400 K/uL 150    No images are attached to the encounter.  MR Brain W Wo Contrast  Addendum Date: 12/25/2020   ADDENDUM REPORT: 12/25/2020 08:54 ADDENDUM: CORRECTION: The left paramidline cerebellar lesion is 5 mm, not 5 cm. Addendum was discussed with Dr. Janese Banks at 5:30 a.m. on 12/25/2020 via telephone. Electronically Signed   By: Margaretha Sheffield MD   On: 12/25/2020 08:54   Result Date: 12/25/2020 CLINICAL DATA:  Small cell lung cancer staging. EXAM: MRI HEAD WITHOUT AND WITH CONTRAST TECHNIQUE: Multiplanar, multiecho pulse sequences of the brain and surrounding structures were obtained without and with intravenous contrast. CONTRAST:  19mL GADAVIST GADOBUTROL 1 MMOL/ML IV SOLN COMPARISON:  None. FINDINGS: Brain: No acute infarct. No hydrocephalus. No midline shift. Basal cisterns are patent. No extra-axial fluid collection. No acute hemorrhage. Multiple heterogeneous, cystic and peripherally enhancing lesions that are compatible with metastatic disease, including: - 1 cm lesion in the lateral right cerebellum (series 18, image 52). - 5 cm lesion in the left paramidline cerebellum (series 18, image  59). - 1 cm lesion in the right parieto-occipital region along the parieto-occipital sulcus (series 18, image 109). Mild edema associated with these lesions, most conspicuous with the right parieto-occipital lesion. No substantial mass effect. Additional mild T2/FLAIR hyperintensities within the white matter and pons are nonenhancing and likely relate to mild for age chronic microvascular ischemic disease. Vascular: Major arterial flow voids are maintained at the skull base. Skull and upper cervical spine: Normal marrow signal. Sinuses/Orbits: Clear sinuses.  Unremarkable orbits. Other: No mastoid effusions. IMPRESSION: Positive for infratentorial and supratentorial metastatic disease with lesions detailed above. Mild associated edema without substantial mass effect. Electronically Signed: By: Margaretha Sheffield MD On: 12/25/2020 08:18     Assessment and plan- Patient is a 75 y.o. 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 Small cell lung cancer in adult [facility])   2. Bone metastases ([place])   3. Antineoplastic chemotherapy induced pancytopenia (CODE) (HCC)   4. Encounter for antineoplastic chemotherapy   5. Hyponatremia    #Extensive small cell lung cancer Patient has received 1 cycle of carboplatin/etoposide/Tecentriq in March 2022. 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  It appears that she has had hyponatremia even prior to lung cancer diagnosis.  Also on hydrochlorothiazide which I encourage patient to discuss with PCP to see if possible to switch to other alternatives. Sodium level has decreased to 120.  Recommend patient to start sodium chloride 1 g 3  times daily. IV normal saline 1 L x 1. Repeat BMP  in 1 week.  #Severe hypokalemia.  We will check magnesium level Patient will get IV potassium chloride 20 meq x1 today. Recommend patient to start potassium chloride 85meq daily and I sent 2-week supply for her.  Further refills per Dr. Janese Banks. Repeat lab in 1 week.  #Hypomagnesia, this is new for her Magnesium level came back at 1.5, after patient's encounter. I recommend patient to Slow-Mag 64 mg daily.  2-week supply prescription was sent to pharmacy.  Further refills per Dr. Janese Banks We will recheck her magnesium level in 1 week.  Bone metastases: Poor dental clearance, no bisphosphonate due to June.  Will defer to Dr. Janese Banks in the future.   #Weight loss and poor appetite.  Discussed with patient about Marinol.  Patient prefers to defer it at this point.  Encourage oral hydration  RTC, 1 week with labs-CMP, magnesium, IV fluids/potassium/magnesium RTC 3 weeks lab MD Dr. Janese Banks for next cycle of carboplatin etoposide and Tecentriq.  Earlie Server, MD, PhD Hematology Oncology [place] [facility] 01/20/2021

## 2021-01-20 NOTE — Patient Instructions (Signed)
[facility]    Discharge Instructions: Thank you for choosing [facility] to provide your oncology and hematology care.  If you have a lab appointment with the [facility], please go directly to the [facility] and check in at the registration area.  Wear comfortable clothing and clothing appropriate for easy access to any Portacath or PICC line.   We strive to give you quality time with your provider. You may need to reschedule your appointment if you arrive late (15 or more minutes).  Arriving late affects you and other patients whose appointments are after yours.  Also, if you miss three or more appointments without notifying the office, you may be dismissed from the clinic at the provider's discretion.      For prescription refill requests, have your pharmacy contact our office and allow 72 hours for refills to be completed.    Today you received the following chemotherapy and/or immunotherapy agents - carboplatin, etoposide, tecentriq   To help prevent nausea and vomiting after your treatment, we encourage you to take your nausea medication as directed.  BELOW ARE SYMPTOMS THAT SHOULD BE REPORTED IMMEDIATELY: . *FEVER GREATER THAN 100.4 F (38 C) OR HIGHER . *CHILLS OR SWEATING . *NAUSEA AND VOMITING THAT IS NOT CONTROLLED WITH YOUR NAUSEA MEDICATION . *UNUSUAL SHORTNESS OF BREATH . *UNUSUAL BRUISING OR BLEEDING . *URINARY PROBLEMS (pain or burning when urinating, or frequent urination) . *BOWEL PROBLEMS (unusual diarrhea, constipation, pain near the anus) . TENDERNESS IN MOUTH AND THROAT WITH OR WITHOUT PRESENCE OF ULCERS (sore throat, sores in mouth, or a toothache) . UNUSUAL RASH, SWELLING OR PAIN  . UNUSUAL VAGINAL DISCHARGE OR ITCHING   Items with * indicate a potential emergency and should be followed up as soon as possible or go to the Emergency Department if any problems should occur.  Please show the CHEMOTHERAPY ALERT  CARD or IMMUNOTHERAPY ALERT CARD at check-in to the Emergency Department and triage nurse.  Should you have questions after your visit or need to cancel or reschedule your appointment, please contact [facility]  [phone] and follow the prompts.  Office hours are 8:00 a.m. to 4:30 p.m. Monday - Friday. Please note that voicemails left after 4:00 p.m. may not be returned until the following business day.  We are closed weekends and major holidays. You have access to a nurse at all times for urgent questions. Please call the main number to the clinic [phone] and follow the prompts.  For any non-urgent questions, you may also contact your provider using MyChart. We now offer e-Visits for anyone 13 and older to request care online for non-urgent symptoms. For details visit [email or web address].   Also download the MyChart app! Go to the app store, search "MyChart", open the app, select [facility], and log in with your MyChart username and password.  Due to Covid, a mask is required upon entering the hospital/clinic. If you do not have a mask, one will be given to you upon arrival. For doctor visits, patients may have 1 support person aged 12 or older with them. For treatment visits, patients cannot have anyone with them due   Potassium chloride injection What is this medicine? POTASSIUM CHLORIDE (poe TASS i um KLOOR ide) is a potassium supplement used to prevent and to treat low potassium. Potassium is important for the heart, muscles, and nerves. Too much or too little potassium in the body can cause serious problems. This medicine  may be used for other purposes; ask your health care provider or pharmacist if you have questions. COMMON BRAND NAME(S): PROAMP What should I tell my health care provider before I take this medicine? They need to know if you have any of these conditions:  Addison disease  dehydration  diabetes (high blood sugar)  heart  disease  high levels of potassium in the blood  irregular heartbeat or rhythm  kidney disease  large areas of burned skin  an unusual or allergic reaction to potassium, other medicines, foods, dyes, or preservatives  pregnant or trying to get pregnant  breast-feeding How should I use this medicine? This medicine is injected into a vein. It is given by a health care provider in a hospital or clinic setting. Talk to your health care provider about the use of this medicine in children. Special care may be needed. Overdosage: If you think you have taken too much of this medicine contact a poison control center or emergency room at once. NOTE: This medicine is only for you. Do not share this medicine with others. What if I miss a dose? This does not apply. This medicine is not for regular use. What may interact with this medicine? Do not take this medicine with any of the following medications:  certain diuretics such as spironolactone, triamterene  eplerenone  sodium polystyrene sulfonate This medicine may also interact with the following medications:  certain medicines for blood pressure or heart disease like lisinopril, losartan, quinapril, valsartan  medicines that lower your chance of fighting infection such as cyclosporine, tacrolimus  NSAIDs, medicines for pain and inflammation, like ibuprofen or naproxen  other potassium supplements  salt substitutes This list may not describe all possible interactions. Give your health care provider a list of all the medicines, herbs, non-prescription drugs, or dietary supplements you use. Also tell them if you smoke, drink alcohol, or use illegal drugs. Some items may interact with your medicine. What should I watch for while using this medicine? Visit your health care provider for regular checks on your progress. Tell your health care provider if your symptoms do not start to get better or if they get worse. You may need blood work  while you are taking this medicine. Avoid salt substitutes unless you are told otherwise by your health care provider. What side effects may I notice from receiving this medicine? Side effects that you should report to your doctor or health care professional as soon as possible:  allergic reactions (skin rash, itching, hives, swelling of the face, lips, tongue, or throat)  confusion  high potassium levels (muscle weakness, fast or irregular heartbeat)  low blood pressure (dizziness, feeling faint or lightheaded, blurry vision)  pain, tingling, or numbness in lips, hands, or feet  pain, redness, or irritation at site where injected  trouble breathing Side effects that usually do not require medical attention (report to your doctor or health care professional if they continue or are bothersome):  diarrhea  nausea, vomiting  passing gas  stomach pain This list may not describe all possible side effects. Call your doctor for medical advice about side effects. You may report side effects to FDA at 1-800-FDA-1088. Where should I keep my medicine? This medicine is given in a hospital or clinic. It will not be stored at home. NOTE: This sheet is a summary. It may not cover all possible information. If you have questions about this medicine, talk to your doctor, pharmacist, or health care provider.  2021 Elsevier/Gold Standard (  2019-06-07 18:18:09) to current Covid guidelines and our immunocompromised population.   Palonosetron Injection What is this medicine? PALONOSETRON (pal oh NOE se tron) is used to prevent nausea and vomiting caused by chemotherapy. It also helps prevent delayed nausea and vomiting that may occur a few days after your treatment. This medicine may be used for other purposes; ask your health care provider or pharmacist if you have questions. COMMON BRAND NAME(S): Aloxi What should I tell my health care provider before I take this medicine? They need to know if you  have any of these conditions:  an unusual or allergic reaction to palonosetron, dolasetron, granisetron, ondansetron, other medicines, foods, dyes, or preservatives  pregnant or trying to get pregnant  breast-feeding How should I use this medicine? This medicine is for infusion into a vein. It is given by a health care professional in a hospital or clinic setting. Talk to your pediatrician regarding the use of this medicine in children. While this drug may be prescribed for children as young as 1 month for selected conditions, precautions do apply. Overdosage: If you think you have taken too much of this medicine contact a poison control center or emergency room at once. NOTE: This medicine is only for you. Do not share this medicine with others. What if I miss a dose? This does not apply. What may interact with this medicine?  certain medicines for depression, anxiety, or psychotic disturbances  fentanyl  linezolid  MAOIs like Carbex, Eldepryl, Marplan, Nardil, and Parnate  methylene blue (injected into a vein)  tramadol This list may not describe all possible interactions. Give your health care provider a list of all the medicines, herbs, non-prescription drugs, or dietary supplements you use. Also tell them if you smoke, drink alcohol, or use illegal drugs. Some items may interact with your medicine. What should I watch for while using this medicine? Your condition will be monitored carefully while you are receiving this medicine. What side effects may I notice from receiving this medicine? Side effects that you should report to your doctor or health care professional as soon as possible:  allergic reactions like skin rash, itching or hives, swelling of the face, lips, or tongue  breathing problems  confusion  dizziness  fast, irregular heartbeat  fever and chills  loss of balance or coordination  seizures  sweating  swelling of the hands and  feet  tremors  unusually weak or tired Side effects that usually do not require medical attention (report to your doctor or health care professional if they continue or are bothersome):  constipation or diarrhea  headache This list may not describe all possible side effects. Call your doctor for medical advice about side effects. You may report side effects to FDA at 1-800-FDA-1088. Where should I keep my medicine? This drug is given in a hospital or clinic and will not be stored at home. NOTE: This sheet is a summary. It may not cover all possible information. If you have questions about this medicine, talk to your doctor, pharmacist, or health care provider.  2021 Elsevier/Gold Standard (2013-06-15 10:38:36)  Dexamethasone injection What is this medicine? DEXAMETHASONE (dex a METH a sone) is a corticosteroid. It is used to treat inflammation of the skin, joints, lungs, and other organs. Common conditions treated include asthma, allergies, and arthritis. It is also used for other conditions, like blood disorders and diseases of the adrenal glands. This medicine may be used for other purposes; ask your health care provider or pharmacist if  you have questions. COMMON BRAND NAME(S): Decadron, DoubleDex, ReadySharp Dexamethasone, Simplist Dexamethasone, Solurex What should I tell my health care provider before I take this medicine? They need to know if you have any of these conditions:  Cushing's syndrome  diabetes  glaucoma  heart disease  high blood pressure  infection like herpes, measles, tuberculosis, or chickenpox  kidney disease  liver disease  mental illness  myasthenia gravis  osteoporosis  previous heart attack  seizures  stomach or intestine problems  thyroid disease  an unusual or allergic reaction to dexamethasone, corticosteroids, other medicines, lactose, foods, dyes, or preservatives  pregnant or trying to get pregnant  breast-feeding How  should I use this medicine? This medicine is for injection into a muscle, joint, lesion, soft tissue, or vein. It is given by a health care professional in a hospital or clinic setting. Talk to your pediatrician regarding the use of this medicine in children. Special care may be needed. Overdosage: If you think you have taken too much of this medicine contact a poison control center or emergency room at once. NOTE: This medicine is only for you. Do not share this medicine with others. What if I miss a dose? This may not apply. If you are having a series of injections over a prolonged period, try not to miss an appointment. Call your doctor or health care professional to reschedule if you are unable to keep an appointment. What may interact with this medicine? Do not take this medicine with any of the following medications:  live virus vaccines This medicine may also interact with the following medications:  aminoglutethimide  amphotericin B  aspirin and aspirin-like medicines  certain antibiotics like erythromycin, clarithromycin, and troleandomycin  certain antivirals for HIV or hepatitis  certain medicines for seizures like carbamazepine, phenobarbital, phenytoin  certain medicines to treat myasthenia gravis  cholestyramine  cyclosporine  digoxin  diuretics  ephedrine  female hormones, like estrogen or progestins and birth control pills  insulin or other medicines for diabetes  isoniazid  ketoconazole  medicines that relax muscles for surgery  mifepristone  NSAIDs, medicines for pain and inflammation, like ibuprofen or naproxen  rifampin  skin tests for allergies  thalidomide  vaccines  warfarin This list may not describe all possible interactions. Give your health care provider a list of all the medicines, herbs, non-prescription drugs, or dietary supplements you use. Also tell them if you smoke, drink alcohol, or use illegal drugs. Some items may  interact with your medicine. What should I watch for while using this medicine? Visit your health care professional for regular checks on your progress. Tell your health care professional if your symptoms do not start to get better or if they get worse. Your condition will be monitored carefully while you are receiving this medicine. Wear a medical ID bracelet or chain. Carry a card that describes your disease and details of your medicine and dosage times. This medicine may increase your risk of getting an infection. Call your health care professional for advice if you get a fever, chills, or sore throat, or other symptoms of a cold or flu. Do not treat yourself. Try to avoid being around people who are sick. Call your health care professional if you are around anyone with measles, chickenpox, or if you develop sores or blisters that do not heal properly. If you are going to need surgery or other procedures, tell your doctor or health care professional that you have taken this medicine within the last 12  months. Ask your doctor or health care professional about your diet. You may need to lower the amount of salt you eat. This medicine may increase blood sugar. Ask your healthcare provider if changes in diet or medicines are needed if you have diabetes. What side effects may I notice from receiving this medicine? Side effects that you should report to your doctor or health care professional as soon as possible:  allergic reactions like skin rash, itching or hives, swelling of the face, lips, or tongue  bloody or black, tarry stools  changes in emotions or moods  changes in vision  confusion, excitement, restlessness  depressed mood  eye pain  hallucinations  muscle weakness  severe or sudden stomach or belly pain  signs and symptoms of high blood sugar such as being more thirsty or hungry or having to urinate more than normal. You may also feel very tired or have blurry vision.  signs  and symptoms of infection like fever; chills; cough; sore throat; pain or trouble passing urine  swelling of ankles, feet  unusual bruising or bleeding  wounds that do not heal Side effects that usually do not require medical attention (report to your doctor or health care professional if they continue or are bothersome):  increased appetite  increased growth of face or body hair  headache  nausea, vomiting  pain, redness, or irritation at site where injected  skin problems, acne, thin and shiny skin  trouble sleeping  weight gain This list may not describe all possible side effects. Call your doctor for medical advice about side effects. You may report side effects to FDA at 1-800-FDA-1088. Where should I keep my medicine? This medicine is given in a hospital or clinic and will not be stored at home. NOTE: This sheet is a summary. It may not cover all possible information. If you have questions about this medicine, talk to your doctor, pharmacist, or health care provider.  2021 Elsevier/Gold Standard (2019-02-20 13:51:58)  Fosaprepitant injection What is this medicine? FOSAPREPITANT (fos ap RE pi tant) is used together with other medicines to prevent nausea and vomiting caused by cancer treatment (chemotherapy). This medicine may be used for other purposes; ask your health care provider or pharmacist if you have questions. COMMON BRAND NAME(S): Emend What should I tell my health care provider before I take this medicine? They need to know if you have any of these conditions:  liver disease  an unusual or allergic reaction to fosaprepitant, aprepitant, medicines, foods, dyes, or preservatives  pregnant or trying to get pregnant  breast-feeding How should I use this medicine? This medicine is for injection into a vein. It is given by a health care professional in a hospital or clinic setting. Talk to your pediatrician regarding the use of this medicine in children.  While this drug may be prescribed for children as young as 6 months for selected conditions, precautions do apply. Overdosage: If you think you have taken too much of this medicine contact a poison control center or emergency room at once. NOTE: This medicine is only for you. Do not share this medicine with others. What if I miss a dose? This does not apply. What may interact with this medicine? Do not take this medicine with any of these medicines:  cisapride  flibanserin  lomitapide  pimozide This medicine may also interact with the following medications:  diltiazem  female hormones, like estrogens or progestins and birth control pills  medicines for fungal infections like ketoconazole and  itraconazole  medicines for HIV  medicines for seizures or to control epilepsy like carbamazepine or phenytoin  medicines used for sleep or anxiety disorders like alprazolam, diazepam, or midazolam  nefazodone  paroxetine  ranolazine  rifampin  some chemotherapy medications like etoposide, ifosfamide, vinblastine, vincristine  some antibiotics like clarithromycin, erythromycin, troleandomycin  steroid medicines like dexamethasone or methylprednisolone  tolbutamide  warfarin This list may not describe all possible interactions. Give your health care provider a list of all the medicines, herbs, non-prescription drugs, or dietary supplements you use. Also tell them if you smoke, drink alcohol, or use illegal drugs. Some items may interact with your medicine. What should I watch for while using this medicine? Do not take this medicine if you already have nausea and vomiting. Ask your health care provider what to do if you already have nausea. Birth control pills and other methods of hormonal contraception (for example, IUD or patch) may not work properly while you are taking this medicine. Use an extra method of birth control during treatment and for 1 month after your last dose of  fosaprepitant. This medicine should not be used continuously for a long time. Visit your doctor or health care professional for regular check-ups. This medicine may change your liver function blood test results. What side effects may I notice from receiving this medicine? Side effects that you should report to your doctor or health care professional as soon as possible:  allergic reactions like skin rash, itching or hives, swelling of the face, lips, or tongue  breathing problems  changes in heart rhythm  high or low blood pressure  pain, redness, or irritation at site where injected  rectal bleeding  serious dizziness or disorientation, confusion  sharp or severe stomach pain  sharp pain in your leg Side effects that usually do not require medical attention (report to your doctor or health care professional if they continue or are bothersome):  constipation or diarrhea  hair loss  headache  hiccups  loss of appetite  nausea  upset stomach  tiredness This list may not describe all possible side effects. Call your doctor for medical advice about side effects. You may report side effects to FDA at 1-800-FDA-1088. Where should I keep my medicine? This drug is given in a hospital or clinic and will not be stored at home. NOTE: This sheet is a summary. It may not cover all possible information. If you have questions about this medicine, talk to your doctor, pharmacist, or health care provider.  2021 Elsevier/Gold Standard (2016-11-25 12:55:48)  Carboplatin injection What is this medicine? CARBOPLATIN (KAR boe pla tin) is a chemotherapy drug. It targets fast dividing cells, like cancer cells, and causes these cells to die. This medicine is used to treat ovarian cancer and many other cancers. This medicine may be used for other purposes; ask your health care provider or pharmacist if you have questions. COMMON BRAND NAME(S): Paraplatin What should I tell my health care  provider before I take this medicine? They need to know if you have any of these conditions:  blood disorders  hearing problems  kidney disease  recent or ongoing radiation therapy  an unusual or allergic reaction to carboplatin, cisplatin, other chemotherapy, other medicines, foods, dyes, or preservatives  pregnant or trying to get pregnant  breast-feeding How should I use this medicine? This drug is usually given as an infusion into a vein. It is administered in a hospital or clinic by a specially trained health care professional. Talk  to your pediatrician regarding the use of this medicine in children. Special care may be needed. Overdosage: If you think you have taken too much of this medicine contact a poison control center or emergency room at once. NOTE: This medicine is only for you. Do not share this medicine with others. What if I miss a dose? It is important not to miss a dose. Call your doctor or health care professional if you are unable to keep an appointment. What may interact with this medicine?  medicines for seizures  medicines to increase blood counts like filgrastim, pegfilgrastim, sargramostim  some antibiotics like amikacin, gentamicin, neomycin, streptomycin, tobramycin  vaccines Talk to your doctor or health care professional before taking any of these medicines:  acetaminophen  aspirin  ibuprofen  ketoprofen  naproxen This list may not describe all possible interactions. Give your health care provider a list of all the medicines, herbs, non-prescription drugs, or dietary supplements you use. Also tell them if you smoke, drink alcohol, or use illegal drugs. Some items may interact with your medicine. What should I watch for while using this medicine? Your condition will be monitored carefully while you are receiving this medicine. You will need important blood work done while you are taking this medicine. This drug may make you feel generally  unwell. This is not uncommon, as chemotherapy can affect healthy cells as well as cancer cells. Report any side effects. Continue your course of treatment even though you feel ill unless your doctor tells you to stop. In some cases, you may be given additional medicines to help with side effects. Follow all directions for their use. Call your doctor or health care professional for advice if you get a fever, chills or sore throat, or other symptoms of a cold or flu. Do not treat yourself. This drug decreases your body's ability to fight infections. Try to avoid being around people who are sick. This medicine may increase your risk to bruise or bleed. Call your doctor or health care professional if you notice any unusual bleeding. Be careful brushing and flossing your teeth or using a toothpick because you may get an infection or bleed more easily. If you have any dental work done, tell your dentist you are receiving this medicine. Avoid taking products that contain aspirin, acetaminophen, ibuprofen, naproxen, or ketoprofen unless instructed by your doctor. These medicines may hide a fever. Do not become pregnant while taking this medicine. Women should inform their doctor if they wish to become pregnant or think they might be pregnant. There is a potential for serious side effects to an unborn child. Talk to your health care professional or pharmacist for more information. Do not breast-feed an infant while taking this medicine. What side effects may I notice from receiving this medicine? Side effects that you should report to your doctor or health care professional as soon as possible:  allergic reactions like skin rash, itching or hives, swelling of the face, lips, or tongue  signs of infection - fever or chills, cough, sore throat, pain or difficulty passing urine  signs of decreased platelets or bleeding - bruising, pinpoint red spots on the skin, black, tarry stools, nosebleeds  signs of  decreased red blood cells - unusually weak or tired, fainting spells, lightheadedness  breathing problems  changes in hearing  changes in vision  chest pain  high blood pressure  low blood counts - This drug may decrease the number of Lucas Exline blood cells, red blood cells and platelets. You  may be at increased risk for infections and bleeding.  nausea and vomiting  pain, swelling, redness or irritation at the injection site  pain, tingling, numbness in the hands or feet  problems with balance, talking, walking  trouble passing urine or change in the amount of urine Side effects that usually do not require medical attention (report to your doctor or health care professional if they continue or are bothersome):  hair loss  loss of appetite  metallic taste in the mouth or changes in taste This list may not describe all possible side effects. Call your doctor for medical advice about side effects. You may report side effects to FDA at 1-800-FDA-1088. Where should I keep my medicine? This drug is given in a hospital or clinic and will not be stored at home. NOTE: This sheet is a summary. It may not cover all possible information. If you have questions about this medicine, talk to your doctor, pharmacist, or health care provider.  2021 Elsevier/Gold Standard (2007-11-14 14:38:05)  Etoposide, VP-16 injection What is this medicine? ETOPOSIDE, VP-16 (e toe POE side) is a chemotherapy drug. It is used to treat testicular cancer, lung cancer, and other cancers. This medicine may be used for other purposes; ask your health care provider or pharmacist if you have questions. COMMON BRAND NAME(S): Etopophos, Toposar, VePesid What should I tell my health care provider before I take this medicine? They need to know if you have any of these conditions:  infection  kidney disease  liver disease  low blood counts, like low Kohan Azizi cell, platelet, or red cell counts  an unusual or allergic  reaction to etoposide, other medicines, foods, dyes, or preservatives  pregnant or trying to get pregnant  breast-feeding How should I use this medicine? This medicine is for infusion into a vein. It is administered in a hospital or clinic by a specially trained health care professional. Talk to your pediatrician regarding the use of this medicine in children. Special care may be needed. Overdosage: If you think you have taken too much of this medicine contact a poison control center or emergency room at once. NOTE: This medicine is only for you. Do not share this medicine with others. What if I miss a dose? It is important not to miss your dose. Call your doctor or health care professional if you are unable to keep an appointment. What may interact with this medicine? This medicine may interact with the following medications:  warfarin This list may not describe all possible interactions. Give your health care provider a list of all the medicines, herbs, non-prescription drugs, or dietary supplements you use. Also tell them if you smoke, drink alcohol, or use illegal drugs. Some items may interact with your medicine. What should I watch for while using this medicine? Visit your doctor for checks on your progress. This drug may make you feel generally unwell. This is not uncommon, as chemotherapy can affect healthy cells as well as cancer cells. Report any side effects. Continue your course of treatment even though you feel ill unless your doctor tells you to stop. In some cases, you may be given additional medicines to help with side effects. Follow all directions for their use. Call your doctor or health care professional for advice if you get a fever, chills or sore throat, or other symptoms of a cold or flu. Do not treat yourself. This drug decreases your body's ability to fight infections. Try to avoid being around people who are sick. This  medicine may increase your risk to bruise or  bleed. Call your doctor or health care professional if you notice any unusual bleeding. Talk to your doctor about your risk of cancer. You may be more at risk for certain types of cancers if you take this medicine. Do not become pregnant while taking this medicine or for at least 6 months after stopping it. Women should inform their doctor if they wish to become pregnant or think they might be pregnant. Women of child-bearing potential will need to have a negative pregnancy test before starting this medicine. There is a potential for serious side effects to an unborn child. Talk to your health care professional or pharmacist for more information. Do not breast-feed an infant while taking this medicine. Men must use a latex condom during sexual contact with a woman while taking this medicine and for at least 4 months after stopping it. A latex condom is needed even if you have had a vasectomy. Contact your doctor right away if your partner becomes pregnant. Do not donate sperm while taking this medicine and for at least 4 months after you stop taking this medicine. Men should inform their doctors if they wish to father a child. This medicine may lower sperm counts. What side effects may I notice from receiving this medicine? Side effects that you should report to your doctor or health care professional as soon as possible:  allergic reactions like skin rash, itching or hives, swelling of the face, lips, or tongue  low blood counts - this medicine may decrease the number of Chelesea Weiand blood cells, red blood cells, and platelets. You may be at increased risk for infections and bleeding  nausea, vomiting  redness, blistering, peeling or loosening of the skin, including inside the mouth  signs and symptoms of infection like fever; chills; cough; sore throat; pain or trouble passing urine  signs and symptoms of low red blood cells or anemia such as unusually weak or tired; feeling faint or lightheaded; falls;  breathing problems  unusual bruising or bleeding Side effects that usually do not require medical attention (report to your doctor or health care professional if they continue or are bothersome):  changes in taste  diarrhea  hair loss  loss of appetite  mouth sores This list may not describe all possible side effects. Call your doctor for medical advice about side effects. You may report side effects to FDA at 1-800-FDA-1088. Where should I keep my medicine? This drug is given in a hospital or clinic and will not be stored at home. NOTE: This sheet is a summary. It may not cover all possible information. If you have questions about this medicine, talk to your doctor, pharmacist, or health care provider.  2021 Elsevier/Gold Standard (2018-10-04 16:57:15)  Atezolizumab injection What is this medicine? ATEZOLIZUMAB (a te zoe LIZ ue mab) is a monoclonal antibody. It is used to treat bladder cancer (urothelial cancer), liver cancer, lung cancer, and melanoma. This medicine may be used for other purposes; ask your health care provider or pharmacist if you have questions. COMMON BRAND NAME(S): Tecentriq What should I tell my health care provider before I take this medicine? They need to know if you have any of these conditions:  autoimmune diseases like Crohn's disease, ulcerative colitis, or lupus  have had or planning to have an allogeneic stem cell transplant (uses someone else's stem cells)  history of organ transplant  history of radiation to the chest  nervous system problems like myasthenia gravis  or Guillain-Barre syndrome  an unusual or allergic reaction to atezolizumab, other medicines, foods, dyes, or preservatives  pregnant or trying to get pregnant  breast-feeding How should I use this medicine? This medicine is for infusion into a vein. It is given by a health care professional in a hospital or clinic setting. A special MedGuide will be given to you before each  treatment. Be sure to read this information carefully each time. Talk to your pediatrician regarding the use of this medicine in children. Special care may be needed. Overdosage: If you think you have taken too much of this medicine contact a poison control center or emergency room at once. NOTE: This medicine is only for you. Do not share this medicine with others. What if I miss a dose? It is important not to miss your dose. Call your doctor or health care professional if you are unable to keep an appointment. What may interact with this medicine? Interactions have not been studied. This list may not describe all possible interactions. Give your health care provider a list of all the medicines, herbs, non-prescription drugs, or dietary supplements you use. Also tell them if you smoke, drink alcohol, or use illegal drugs. Some items may interact with your medicine. What should I watch for while using this medicine? Your condition will be monitored carefully while you are receiving this medicine. You may need blood work done while you are taking this medicine. Do not become pregnant while taking this medicine or for at least 5 months after stopping it. Women should inform their doctor if they wish to become pregnant or think they might be pregnant. There is a potential for serious side effects to an unborn child. Talk to your health care professional or pharmacist for more information. Do not breast-feed an infant while taking this medicine or for at least 5 months after the last dose. What side effects may I notice from receiving this medicine? Side effects that you should report to your doctor or health care professional as soon as possible:  allergic reactions like skin rash, itching or hives, swelling of the face, lips, or tongue  black, tarry stools  bloody or watery diarrhea  breathing problems  changes in vision  chest pain or chest tightness  chills  facial  flushing  fever  headache  signs and symptoms of high blood sugar such as dizziness; dry mouth; dry skin; fruity breath; nausea; stomach pain; increased hunger or thirst; increased urination  signs and symptoms of liver injury like dark yellow or brown urine; general ill feeling or flu-like symptoms; light-colored stools; loss of appetite; nausea; right upper belly pain; unusually weak or tired; yellowing of the eyes or skin  stomach pain  trouble passing urine or change in the amount of urine Side effects that usually do not require medical attention (report to your doctor or health care professional if they continue or are bothersome):  bone pain  cough  diarrhea  joint pain  muscle pain  muscle weakness  swelling of arms or legs  tiredness  weight loss This list may not describe all possible side effects. Call your doctor for medical advice about side effects. You may report side effects to FDA at 1-800-FDA-1088. Where should I keep my medicine? This drug is given in a hospital or clinic and will not be stored at home. NOTE: This sheet is a summary. It may not cover all possible information. If you have questions about this medicine, talk to your doctor,  pharmacist, or health care provider.  2021 Elsevier/Gold Standard (2020-05-08 13:59:34)

## 2021-01-21 ENCOUNTER — Inpatient Hospital Stay: Payer: Medicare Other | Attending: Oncology

## 2021-01-21 ENCOUNTER — Other Ambulatory Visit: Payer: Self-pay

## 2021-01-21 DIAGNOSIS — Z79899 Other long term (current) drug therapy: Secondary | ICD-10-CM | POA: Insufficient documentation

## 2021-01-21 DIAGNOSIS — E871 Hypo-osmolality and hyponatremia: Secondary | ICD-10-CM | POA: Diagnosis not present

## 2021-01-21 DIAGNOSIS — Z5189 Encounter for other specified aftercare: Secondary | ICD-10-CM | POA: Insufficient documentation

## 2021-01-21 DIAGNOSIS — C7931 Secondary malignant neoplasm of brain: Secondary | ICD-10-CM | POA: Insufficient documentation

## 2021-01-21 DIAGNOSIS — C3432 Malignant neoplasm of lower lobe, left bronchus or lung: Secondary | ICD-10-CM | POA: Diagnosis present

## 2021-01-21 DIAGNOSIS — R600 Localized edema: Secondary | ICD-10-CM | POA: Diagnosis not present

## 2021-01-21 DIAGNOSIS — Z8049 Family history of malignant neoplasm of other genital organs: Secondary | ICD-10-CM | POA: Diagnosis not present

## 2021-01-21 DIAGNOSIS — Z5112 Encounter for antineoplastic immunotherapy: Secondary | ICD-10-CM | POA: Diagnosis present

## 2021-01-21 DIAGNOSIS — L0390 Cellulitis, unspecified: Secondary | ICD-10-CM | POA: Diagnosis not present

## 2021-01-21 DIAGNOSIS — Z8249 Family history of ischemic heart disease and other diseases of the circulatory system: Secondary | ICD-10-CM | POA: Diagnosis not present

## 2021-01-21 DIAGNOSIS — Z801 Family history of malignant neoplasm of trachea, bronchus and lung: Secondary | ICD-10-CM | POA: Insufficient documentation

## 2021-01-21 DIAGNOSIS — Z818 Family history of other mental and behavioral disorders: Secondary | ICD-10-CM | POA: Insufficient documentation

## 2021-01-21 DIAGNOSIS — R531 Weakness: Secondary | ICD-10-CM | POA: Diagnosis not present

## 2021-01-21 DIAGNOSIS — Z5111 Encounter for antineoplastic chemotherapy: Secondary | ICD-10-CM | POA: Insufficient documentation

## 2021-01-21 DIAGNOSIS — C7951 Secondary malignant neoplasm of bone: Secondary | ICD-10-CM | POA: Diagnosis not present

## 2021-01-21 DIAGNOSIS — C3490 Malignant neoplasm of unspecified part of unspecified bronchus or lung: Secondary | ICD-10-CM

## 2021-01-21 DIAGNOSIS — G9340 Encephalopathy, unspecified: Secondary | ICD-10-CM | POA: Diagnosis not present

## 2021-01-21 LAB — T4: T4, Total: 5.9 ug/dL (ref 4.5–12.0)

## 2021-01-21 MED ORDER — HEPARIN SOD (PORK) LOCK FLUSH 100 UNIT/ML IV SOLN
500.0000 [IU] | Freq: Once | INTRAVENOUS | Status: AC | PRN
  Administered 2021-01-21: 500 [IU]
  Filled 2021-01-21: qty 5

## 2021-01-21 MED ORDER — SODIUM CHLORIDE 0.9% FLUSH
10.0000 mL | INTRAVENOUS | Status: DC | PRN
  Administered 2021-01-21: 10 mL
  Filled 2021-01-21: qty 10

## 2021-01-21 MED ORDER — SODIUM CHLORIDE 0.9 % IV SOLN
Freq: Once | INTRAVENOUS | Status: AC
  Filled 2021-01-21: qty 250

## 2021-01-21 MED ORDER — SODIUM CHLORIDE 0.9 % IV SOLN
100.0000 mg/m2 | Freq: Once | INTRAVENOUS | Status: AC
  Administered 2021-01-21: 180 mg via INTRAVENOUS
  Filled 2021-01-21: qty 9

## 2021-01-21 MED ORDER — DEXAMETHASONE SODIUM PHOSPHATE 100 MG/10ML IJ SOLN
10.0000 mg | Freq: Once | INTRAMUSCULAR | Status: AC
  Administered 2021-01-21: 10 mg via INTRAVENOUS
  Filled 2021-01-21: qty 10

## 2021-01-21 MED ORDER — HEPARIN SOD (PORK) LOCK FLUSH 100 UNIT/ML IV SOLN
INTRAVENOUS | Status: AC
  Filled 2021-01-21: qty 5

## 2021-01-21 NOTE — Patient Instructions (Signed)
[facility]  Discharge Instructions: Thank you for choosing [facility] to provide your oncology and hematology care.  If you have a lab appointment with the [facility], please go directly to the [facility] and check in at the registration area.  Wear comfortable clothing and clothing appropriate for easy access to any Portacath or PICC line.   We strive to give you quality time with your provider. You may need to reschedule your appointment if you arrive late (15 or more minutes).  Arriving late affects you and other patients whose appointments are after yours.  Also, if you miss three or more appointments without notifying the office, you may be dismissed from the clinic at the provider's discretion.      For prescription refill requests, have your pharmacy contact our office and allow 72 hours for refills to be completed.    Today you received the following chemotherapy and/or immunotherapy agents Etoposide      To help prevent nausea and vomiting after your treatment, we encourage you to take your nausea medication as directed.  BELOW ARE SYMPTOMS THAT SHOULD BE REPORTED IMMEDIATELY: . *FEVER GREATER THAN 100.4 F (38 C) OR HIGHER . *CHILLS OR SWEATING . *NAUSEA AND VOMITING THAT IS NOT CONTROLLED WITH YOUR NAUSEA MEDICATION . *UNUSUAL SHORTNESS OF BREATH . *UNUSUAL BRUISING OR BLEEDING . *URINARY PROBLEMS (pain or burning when urinating, or frequent urination) . *BOWEL PROBLEMS (unusual diarrhea, constipation, pain near the anus) . TENDERNESS IN MOUTH AND THROAT WITH OR WITHOUT PRESENCE OF ULCERS (sore throat, sores in mouth, or a toothache) . UNUSUAL RASH, SWELLING OR PAIN  . UNUSUAL VAGINAL DISCHARGE OR ITCHING   Items with * indicate a potential emergency and should be followed up as soon as possible or go to the Emergency Department if any problems should occur.  Please show the CHEMOTHERAPY ALERT CARD or IMMUNOTHERAPY  ALERT CARD at check-in to the Emergency Department and triage nurse.  Should you have questions after your visit or need to cancel or reschedule your appointment, please contact [facility]  [phone] and follow the prompts.  Office hours are 8:00 a.m. to 4:30 p.m. Monday - Friday. Please note that voicemails left after 4:00 p.m. may not be returned until the following business day.  We are closed weekends and major holidays. You have access to a nurse at all times for urgent questions. Please call the main number to the clinic [phone] and follow the prompts.  For any non-urgent questions, you may also contact your provider using MyChart. We now offer e-Visits for anyone 53 and older to request care online for non-urgent symptoms. For details visit [email or web address].   Also download the MyChart app! Go to the app store, search "MyChart", open the app, select [facility], and log in with your MyChart username and password.  Due to Covid, a mask is required upon entering the hospital/clinic. If you do not have a mask, one will be given to you upon arrival. For doctor visits, patients may have 1 support person aged 29 or older with them. For treatment visits, patients cannot have anyone with them due to current Covid guidelines and our immunocompromised population. Etoposide, VP-16 injection What is this medicine? ETOPOSIDE, VP-16 (e toe POE side) is a chemotherapy drug. It is used to treat testicular cancer, lung cancer, and other cancers. This medicine may be used for other purposes; ask your health care provider or pharmacist if you have  questions. COMMON BRAND NAME(S): Etopophos, Toposar, VePesid What should I tell my health care provider before I take this medicine? They need to know if you have any of these conditions:  infection  kidney disease  liver disease  low blood counts, like low white cell, platelet, or red cell counts  an unusual  or allergic reaction to etoposide, other medicines, foods, dyes, or preservatives  pregnant or trying to get pregnant  breast-feeding How should I use this medicine? This medicine is for infusion into a vein. It is administered in a hospital or clinic by a specially trained health care professional. Talk to your pediatrician regarding the use of this medicine in children. Special care may be needed. Overdosage: If you think you have taken too much of this medicine contact a poison control center or emergency room at once. NOTE: This medicine is only for you. Do not share this medicine with others. What if I miss a dose? It is important not to miss your dose. Call your doctor or health care professional if you are unable to keep an appointment. What may interact with this medicine? This medicine may interact with the following medications:  warfarin This list may not describe all possible interactions. Give your health care provider a list of all the medicines, herbs, non-prescription drugs, or dietary supplements you use. Also tell them if you smoke, drink alcohol, or use illegal drugs. Some items may interact with your medicine. What should I watch for while using this medicine? Visit your doctor for checks on your progress. This drug may make you feel generally unwell. This is not uncommon, as chemotherapy can affect healthy cells as well as cancer cells. Report any side effects. Continue your course of treatment even though you feel ill unless your doctor tells you to stop. In some cases, you may be given additional medicines to help with side effects. Follow all directions for their use. Call your doctor or health care professional for advice if you get a fever, chills or sore throat, or other symptoms of a cold or flu. Do not treat yourself. This drug decreases your body's ability to fight infections. Try to avoid being around people who are sick. This medicine may increase your risk to  bruise or bleed. Call your doctor or health care professional if you notice any unusual bleeding. Talk to your doctor about your risk of cancer. You may be more at risk for certain types of cancers if you take this medicine. Do not become pregnant while taking this medicine or for at least 6 months after stopping it. Women should inform their doctor if they wish to become pregnant or think they might be pregnant. Women of child-bearing potential will need to have a negative pregnancy test before starting this medicine. There is a potential for serious side effects to an unborn child. Talk to your health care professional or pharmacist for more information. Do not breast-feed an infant while taking this medicine. Men must use a latex condom during sexual contact with a woman while taking this medicine and for at least 4 months after stopping it. A latex condom is needed even if you have had a vasectomy. Contact your doctor right away if your partner becomes pregnant. Do not donate sperm while taking this medicine and for at least 4 months after you stop taking this medicine. Men should inform their doctors if they wish to father a child. This medicine may lower sperm counts. What side effects may I notice  from receiving this medicine? Side effects that you should report to your doctor or health care professional as soon as possible:  allergic reactions like skin rash, itching or hives, swelling of the face, lips, or tongue  low blood counts - this medicine may decrease the number of white blood cells, red blood cells, and platelets. You may be at increased risk for infections and bleeding  nausea, vomiting  redness, blistering, peeling or loosening of the skin, including inside the mouth  signs and symptoms of infection like fever; chills; cough; sore throat; pain or trouble passing urine  signs and symptoms of low red blood cells or anemia such as unusually weak or tired; feeling faint or  lightheaded; falls; breathing problems  unusual bruising or bleeding Side effects that usually do not require medical attention (report to your doctor or health care professional if they continue or are bothersome):  changes in taste  diarrhea  hair loss  loss of appetite  mouth sores This list may not describe all possible side effects. Call your doctor for medical advice about side effects. You may report side effects to FDA at 1-800-FDA-1088. Where should I keep my medicine? This drug is given in a hospital or clinic and will not be stored at home. NOTE: This sheet is a summary. It may not cover all possible information. If you have questions about this medicine, talk to your doctor, pharmacist, or health care provider.  2021 Elsevier/Gold Standard (2018-10-04 16:57:15)

## 2021-01-22 ENCOUNTER — Inpatient Hospital Stay: Payer: Medicare Other

## 2021-01-22 ENCOUNTER — Ambulatory Visit: Payer: Medicare Other

## 2021-01-22 ENCOUNTER — Other Ambulatory Visit: Payer: Self-pay

## 2021-01-22 VITALS — BP 137/51 | HR 63 | Temp 97.0°F | Resp 20

## 2021-01-22 DIAGNOSIS — C3490 Malignant neoplasm of unspecified part of unspecified bronchus or lung: Secondary | ICD-10-CM

## 2021-01-22 DIAGNOSIS — Z5112 Encounter for antineoplastic immunotherapy: Secondary | ICD-10-CM | POA: Diagnosis not present

## 2021-01-22 MED ORDER — HEPARIN SOD (PORK) LOCK FLUSH 100 UNIT/ML IV SOLN
INTRAVENOUS | Status: AC
  Filled 2021-01-22: qty 5

## 2021-01-22 MED ORDER — PEGFILGRASTIM 6 MG/0.6ML ~~LOC~~ PSKT
6.0000 mg | PREFILLED_SYRINGE | Freq: Once | SUBCUTANEOUS | Status: AC
  Administered 2021-01-22: 6 mg via SUBCUTANEOUS
  Filled 2021-01-22: qty 0.6

## 2021-01-22 MED ORDER — HEPARIN SOD (PORK) LOCK FLUSH 100 UNIT/ML IV SOLN
500.0000 [IU] | Freq: Once | INTRAVENOUS | Status: AC | PRN
  Administered 2021-01-22: 500 [IU]
  Filled 2021-01-22: qty 5

## 2021-01-22 MED ORDER — SODIUM CHLORIDE 0.9 % IV SOLN
100.0000 mg/m2 | Freq: Once | INTRAVENOUS | Status: AC
  Administered 2021-01-22: 180 mg via INTRAVENOUS
  Filled 2021-01-22: qty 9

## 2021-01-22 MED ORDER — SODIUM CHLORIDE 0.9 % IV SOLN
10.0000 mg | Freq: Once | INTRAVENOUS | Status: AC
  Administered 2021-01-22: 10 mg via INTRAVENOUS
  Filled 2021-01-22: qty 10

## 2021-01-22 MED ORDER — SODIUM CHLORIDE 0.9 % IV SOLN
Freq: Once | INTRAVENOUS | Status: AC
  Filled 2021-01-22: qty 250

## 2021-01-22 MED ORDER — SLOW-MAG 71.5-119 MG PO TBEC: 1.0000 | DELAYED_RELEASE_TABLET | Freq: Every day | ORAL | 0 refills | Status: DC

## 2021-01-22 NOTE — Progress Notes (Signed)
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 Past medical history of smoker, COPD, HTN, hyponatremia.  Patient completed radiation on 5/31.  Patient receiving chemotherapy.   Met with patient during infusion.  Patient reports taste alterations.  Can taste sweet things and she has never really like sweet things before.  Patient reports MD talked to her about marinol but she wanted to hold off for now.  Just bought some boost shakes yesterday but has not tried them.     Medications: Na CL, KCL, Mag, compazine   Labs: Na 120, K 2.8, Mag 1.5   Anthropometrics:   Height: 67 inches Weight: 139 lb on 5/31 145 lb on 5/12 144 lb on 11/19/20 BMI: 21  4% weight loss in the last 2 weeks  Estimated Energy Needs  Kcals: 1575-1900 Protein: 78-95 g Fluid: 1.5 L   NUTRITION DIAGNOSIS: Inadequate oral intake related to cancer and cancer related treatment side effects as evidenced by 4% weight loss in the last 2 weeks and taste alterations   INTERVENTION:  Discussed strategies to help with taste alterations.  Handout provided with tips to try. Discussed high calorie shakes (350 calories or higher).  Samples of boost plus, ensure complete, orgain and [employer] shake given with coupons Contact information provided   MONITORING, EVALUATION, GOAL: weight trends, intake   Next Visit: Tuesday, June 21 during infusion  Beth Mcmillan B. Zenia Resides, [place], [place] Registered Dietitian [phone] (mobile)

## 2021-01-22 NOTE — Patient Instructions (Signed)
[facility]   Discharge Instructions: Thank you for choosing [facility] to provide your oncology and hematology care.  If you have a lab appointment with the [facility], please go directly to the [facility] and check in at the registration area.  Wear comfortable clothing and clothing appropriate for easy access to any Portacath or PICC line.   We strive to give you quality time with your provider. You may need to reschedule your appointment if you arrive late (15 or more minutes).  Arriving late affects you and other patients whose appointments are after yours.  Also, if you miss three or more appointments without notifying the office, you may be dismissed from the clinic at the provider's discretion.      For prescription refill requests, have your pharmacy contact our office and allow 72 hours for refills to be completed.    Today you received the following chemotherapy and/or immunotherapy agents Eptoside    To help prevent nausea and vomiting after your treatment, we encourage you to take your nausea medication as directed.  BELOW ARE SYMPTOMS THAT SHOULD BE REPORTED IMMEDIATELY: . *FEVER GREATER THAN 100.4 F (38 C) OR HIGHER . *CHILLS OR SWEATING . *NAUSEA AND VOMITING THAT IS NOT CONTROLLED WITH YOUR NAUSEA MEDICATION . *UNUSUAL SHORTNESS OF BREATH . *UNUSUAL BRUISING OR BLEEDING . *URINARY PROBLEMS (pain or burning when urinating, or frequent urination) . *BOWEL PROBLEMS (unusual diarrhea, constipation, pain near the anus) . TENDERNESS IN MOUTH AND THROAT WITH OR WITHOUT PRESENCE OF ULCERS (sore throat, sores in mouth, or a toothache) . UNUSUAL RASH, SWELLING OR PAIN  . UNUSUAL VAGINAL DISCHARGE OR ITCHING   Items with * indicate a potential emergency and should be followed up as soon as possible or go to the Emergency Department if any problems should occur.  Please show the CHEMOTHERAPY ALERT CARD or IMMUNOTHERAPY ALERT  CARD at check-in to the Emergency Department and triage nurse.  Should you have questions after your visit or need to cancel or reschedule your appointment, please contact [facility]  [phone] and follow the prompts.  Office hours are 8:00 a.m. to 4:30 p.m. Monday - Friday. Please note that voicemails left after 4:00 p.m. may not be returned until the following business day.  We are closed weekends and major holidays. You have access to a nurse at all times for urgent questions. Please call the main number to the clinic [phone] and follow the prompts.  For any non-urgent questions, you may also contact your provider using MyChart. We now offer e-Visits for anyone 16 and older to request care online for non-urgent symptoms. For details visit [email or web address].   Also download the MyChart app! Go to the app store, search "MyChart", open the app, select [facility], and log in with your MyChart username and password.  Due to Covid, a mask is required upon entering the hospital/clinic. If you do not have a mask, one will be given to you upon arrival. For doctor visits, patients may have 1 support person aged 44 or older with them. For treatment visits, patients cannot have anyone with them due to current Covid guidelines and our immunocompromised population.

## 2021-01-26 ENCOUNTER — Other Ambulatory Visit: Payer: Medicare Other

## 2021-01-26 ENCOUNTER — Ambulatory Visit: Payer: Medicare Other | Admitting: Oncology

## 2021-01-26 ENCOUNTER — Ambulatory Visit: Payer: Medicare Other

## 2021-01-26 ENCOUNTER — Encounter: Payer: Self-pay | Admitting: Oncology

## 2021-01-27 ENCOUNTER — Inpatient Hospital Stay: Payer: Medicare Other

## 2021-01-27 ENCOUNTER — Ambulatory Visit: Payer: Medicare Other

## 2021-01-27 ENCOUNTER — Other Ambulatory Visit: Payer: Self-pay

## 2021-01-27 DIAGNOSIS — E876 Hypokalemia: Secondary | ICD-10-CM

## 2021-01-27 DIAGNOSIS — E871 Hypo-osmolality and hyponatremia: Secondary | ICD-10-CM

## 2021-01-27 DIAGNOSIS — Z5112 Encounter for antineoplastic immunotherapy: Secondary | ICD-10-CM | POA: Diagnosis not present

## 2021-01-27 LAB — COMPREHENSIVE METABOLIC PANEL
ALT: 17 U/L (ref 0–44)
AST: 19 U/L (ref 15–41)
Albumin: 3 g/dL — ABNORMAL LOW (ref 3.5–5.0)
Alkaline Phosphatase: 54 U/L (ref 38–126)
Anion gap: 12 (ref 5–15)
BUN: 15 mg/dL (ref 8–23)
CO2: 24 mmol/L (ref 22–32)
Calcium: 8.3 mg/dL — ABNORMAL LOW (ref 8.9–10.3)
Chloride: 85 mmol/L — ABNORMAL LOW (ref 98–111)
Creatinine, Ser: 0.71 mg/dL (ref 0.44–1.00)
GFR, Estimated: >60 mL/min (ref >60)
Glucose, Bld: 109 mg/dL — ABNORMAL HIGH (ref 70–99)
Potassium: 3.4 mmol/L — ABNORMAL LOW (ref 3.5–5.1)
Sodium: 121 mmol/L — ABNORMAL LOW (ref 135–145)
Total Bilirubin: 1.2 mg/dL (ref 0.3–1.2)
Total Protein: 5.4 g/dL — ABNORMAL LOW (ref 6.5–8.1)

## 2021-01-27 LAB — MAGNESIUM: Magnesium: 1.4 mg/dL — ABNORMAL LOW (ref 1.7–2.4)

## 2021-01-27 MED ORDER — HEPARIN SOD (PORK) LOCK FLUSH 100 UNIT/ML IV SOLN
500.0000 [IU] | Freq: Once | INTRAVENOUS | Status: AC
  Administered 2021-01-27: 500 [IU] via INTRAVENOUS
  Filled 2021-01-27: qty 5

## 2021-01-27 MED ORDER — POTASSIUM CHLORIDE 20 MEQ/100ML IV SOLN
20.0000 meq | Freq: Once | INTRAVENOUS | Status: AC
Start: 2021-01-27 — End: 2021-01-27
  Administered 2021-01-27: 20 meq via INTRAVENOUS

## 2021-01-27 MED ORDER — SODIUM CHLORIDE 0.9 % IV SOLN
Freq: Once | INTRAVENOUS | Status: AC
Start: 2021-01-27 — End: 2021-01-27
  Filled 2021-01-27: qty 250

## 2021-01-27 MED ORDER — SODIUM CHLORIDE 0.9% FLUSH
10.0000 mL | INTRAVENOUS | Status: DC | PRN
  Administered 2021-01-27: 10 mL via INTRAVENOUS
  Filled 2021-01-27: qty 10

## 2021-01-27 MED ORDER — MAGNESIUM SULFATE 2 GM/50ML IV SOLN
2.0000 g | Freq: Once | INTRAVENOUS | Status: AC
Start: 2021-01-27 — End: 2021-01-27
  Administered 2021-01-27: 2 g via INTRAVENOUS
  Filled 2021-01-27: qty 50

## 2021-01-27 NOTE — Patient Instructions (Signed)
**   Patient to HOLD her BP medicine - Losartan/HCTZ until further notice. Patient to call her PCP Dr Doy Hutching today and discuss low Blood Pressures.

## 2021-01-27 NOTE — Progress Notes (Signed)
Pt here for IVF's with addition of Potassium and magnesium. Pt having episodes of hypotension. BP 109/40. Notified NP Beckey Rutter. She gave me instructions to have pt hold her losartan/HCTZ tablets and reach out to her PCP Dr Doy Hutching. I informed pt of above. She does not see him until Aug. She will call his office today. Discharged to home post fluids. No complaints. BP 130/53.

## 2021-01-28 ENCOUNTER — Other Ambulatory Visit: Payer: Self-pay

## 2021-01-28 ENCOUNTER — Emergency Department: Payer: Medicare Other

## 2021-01-28 ENCOUNTER — Emergency Department
Admission: EM | Admit: 2021-01-28 | Discharge: 2021-01-29 | Disposition: A | Discharge status: Home / Self Care | Payer: Medicare Other | Attending: Emergency Medicine | Admitting: Emergency Medicine

## 2021-01-28 ENCOUNTER — Telehealth: Payer: Self-pay | Admitting: *Deleted

## 2021-01-28 ENCOUNTER — Encounter: Payer: Self-pay | Admitting: *Deleted

## 2021-01-28 ENCOUNTER — Ambulatory Visit: Payer: Medicare Other

## 2021-01-28 DIAGNOSIS — Z79899 Other long term (current) drug therapy: Secondary | ICD-10-CM | POA: Insufficient documentation

## 2021-01-28 DIAGNOSIS — E039 Hypothyroidism, unspecified: Secondary | ICD-10-CM | POA: Insufficient documentation

## 2021-01-28 DIAGNOSIS — D72819 Decreased white blood cell count, unspecified: Secondary | ICD-10-CM

## 2021-01-28 DIAGNOSIS — Z87891 Personal history of nicotine dependence: Secondary | ICD-10-CM | POA: Diagnosis not present

## 2021-01-28 DIAGNOSIS — R531 Weakness: Secondary | ICD-10-CM | POA: Diagnosis present

## 2021-01-28 DIAGNOSIS — J449 Chronic obstructive pulmonary disease, unspecified: Secondary | ICD-10-CM | POA: Insufficient documentation

## 2021-01-28 DIAGNOSIS — Z7951 Long term (current) use of inhaled steroids: Secondary | ICD-10-CM | POA: Insufficient documentation

## 2021-01-28 DIAGNOSIS — E871 Hypo-osmolality and hyponatremia: Secondary | ICD-10-CM | POA: Diagnosis not present

## 2021-01-28 DIAGNOSIS — Z85118 Personal history of other malignant neoplasm of bronchus and lung: Secondary | ICD-10-CM | POA: Insufficient documentation

## 2021-01-28 DIAGNOSIS — I10 Essential (primary) hypertension: Secondary | ICD-10-CM | POA: Insufficient documentation

## 2021-01-28 LAB — CBC
HCT: 21.8 % — ABNORMAL LOW (ref 36.0–46.0)
Hemoglobin: 8 g/dL — ABNORMAL LOW (ref 12.0–15.0)
MCH: 33.1 pg (ref 26.0–34.0)
MCHC: 36.7 g/dL — ABNORMAL HIGH (ref 30.0–36.0)
MCV: 90.1 fL (ref 80.0–100.0)
Platelets: 38 10*3/uL — ABNORMAL LOW (ref 150–400)
RBC: 2.42 MIL/uL — ABNORMAL LOW (ref 3.87–5.11)
RDW: 13.9 % (ref 11.5–15.5)
WBC: 0.5 10*3/uL — CL (ref 4.0–10.5)
nRBC: 0 % (ref 0.0–0.2)

## 2021-01-28 LAB — URINALYSIS, COMPLETE (UACMP) WITH MICROSCOPIC
Bilirubin Urine: NEGATIVE
Glucose, UA: 50 mg/dL — AB
Hgb urine dipstick: NEGATIVE
Ketones, ur: NEGATIVE mg/dL
Leukocytes,Ua: NEGATIVE
Nitrite: NEGATIVE
Protein, ur: NEGATIVE mg/dL
Specific Gravity, Urine: 1.013 (ref 1.005–1.030)
pH: 7 (ref 5.0–8.0)

## 2021-01-28 LAB — TROPONIN I (HIGH SENSITIVITY): Troponin I (High Sensitivity): 7 ng/L (ref <18)

## 2021-01-28 LAB — BASIC METABOLIC PANEL
Anion gap: 7 (ref 5–15)
BUN: 14 mg/dL (ref 8–23)
CO2: 26 mmol/L (ref 22–32)
Calcium: 9 mg/dL (ref 8.9–10.3)
Chloride: 91 mmol/L — ABNORMAL LOW (ref 98–111)
Creatinine, Ser: 0.74 mg/dL (ref 0.44–1.00)
GFR, Estimated: >60 mL/min (ref >60)
Glucose, Bld: 155 mg/dL — ABNORMAL HIGH (ref 70–99)
Potassium: 4.1 mmol/L (ref 3.5–5.1)
Sodium: 124 mmol/L — ABNORMAL LOW (ref 135–145)

## 2021-01-28 LAB — MAGNESIUM: Magnesium: 1.5 mg/dL — ABNORMAL LOW (ref 1.7–2.4)

## 2021-01-28 MED ORDER — SODIUM CHLORIDE 0.9 % IV BOLUS
1000.0000 mL | Freq: Once | INTRAVENOUS | Status: AC
  Administered 2021-01-28: 1000 mL via INTRAVENOUS

## 2021-01-28 MED ORDER — CEFDINIR 300 MG PO CAPS: 300.0000 mg | ORAL_CAPSULE | Freq: Two times a day (BID) | ORAL | 0 refills | Status: AC

## 2021-01-28 MED ORDER — MAGNESIUM SULFATE 2 GM/50ML IV SOLN
2.0000 g | Freq: Once | INTRAVENOUS | Status: AC
  Administered 2021-01-28: 2 g via INTRAVENOUS
  Filled 2021-01-28: qty 50

## 2021-01-28 NOTE — Telephone Encounter (Addendum)
Spoke with patient's daughter and Dr. Doy Hutching advised her to d/c Amlodipine (has not taken 2 doses).  BP readings today at 10:30 am 89/53-HR 62, 2:30 pm 95/58.  She also has a call to Dr. Doy Hutching office for advice but wanted to check if there was any advice we may offer.  Patient's daughter does have access to patient's MyChart and approves corresponding via MyChart.  She will send our office a MyChart message with recommendations of Dr. Doy Hutching.

## 2021-01-28 NOTE — ED Notes (Signed)
0.5 WBC critical value.  Notified Dr. Charna Archer.

## 2021-01-28 NOTE — ED Notes (Signed)
No second troponin needed per MD.

## 2021-01-28 NOTE — ED Notes (Signed)
Patient transported to X-ray 

## 2021-01-28 NOTE — ED Triage Notes (Addendum)
Pt to triage via wheelchair.  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  Pt on chemo which was last week.   Pt here tonight for hypotension for the past 2 days and weakness per daughter.  . Pt has a port.  Pt alert

## 2021-01-28 NOTE — ED Notes (Signed)
ED Provider at bedside. 

## 2021-01-28 NOTE — ED Notes (Signed)
Patient returns from X ray.

## 2021-01-28 NOTE — Telephone Encounter (Signed)
Left message with answering services yesterday evening stating that we needed to contact Dr Doy Hutching regarding her blood pressure issues

## 2021-01-28 NOTE — ED Provider Notes (Signed)
[facility] Emergency Department Provider Note   ____________________________________________   Event Date/Time   First MD Initiated Contact with Patient 01/28/21 2013     (approximate)  I have reviewed the triage vital signs and the nursing notes.   HISTORY  Chief Complaint Generalized weakness   HPI Beth Mcmillan is a 75 y.o. female with past medical history of hypertension, hyperlipidemia, COPD, and lung cancer metastatic to brain who presents to the ED complaining of generalized weakness.  Patient reports that she completed a round of chemotherapy last week but has been feeling generally weak since then.  She was prescribed electrolyte supplementation by her oncologist, additionally had IV fluids administered earlier this week.  She states that her "legs feel like rags" and that they sometimes seem like they will give out on her.  She denies any fevers, cough, chest pain, shortness of breath, abdominal pain, vomiting, diarrhea, or dysuria.  She does state that her blood pressure has been running as low as 90/50 at home.  She was told to stop her amlodipine, which she did 2 days ago, but she again noticed low BP at home this afternoon.        Past Medical History:  Diagnosis Date  . Anxiety   . Breathing problem    low breathing function 53%  . COPD (chronic obstructive pulmonary disease) (HCC)    EMPHYSEMA  . Dysrhythmia    tachycardia  . Emphysema of lung ([place])   . Hemorrhoid   . History of hiatal hernia    SMALL- 2022  . Hypercholesteremia   . Hypertension   . Hypothyroidism   . Larynx polyp   . Lung cancer ([place])   . Palpitations    with anxiety  . Platelets decreased ([place]) 2020  . Seasonal allergies   . Tinnitus   . Vertigo    no episodes in several years  . Wears dentures    partial lower  . White coat syndrome with hypertension     Patient Active Problem List   Diagnosis Date Noted  . Bone metastases ([place]) 01/01/2021  .  Goals of care, counseling/discussion 12/13/2020  . 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 Personal history of colonic polyps   . Polyp of transverse colon   . Special screening for malignant neoplasms, colon   . Benign neoplasm of cecum   . Benign neoplasm of ascending colon     Past Surgical History:  Procedure Laterality Date  . ABDOMINAL HYSTERECTOMY     partial  . COLONOSCOPY WITH PROPOFOL N/A 01/22/2016   Procedure: COLONOSCOPY WITH PROPOFOL;  Surgeon: Lucilla Lame, MD;  Location: [facility];  Service: Endoscopy;  Laterality: N/A;  . COLONOSCOPY WITH PROPOFOL N/A 04/12/2019   Procedure: COLONOSCOPY WITH BIOPSY;  Surgeon: Lucilla Lame, MD;  Location: [facility];  Service: Endoscopy;  Laterality: N/A;  pt would like an early appt  . ct guided lung bx    . IR IMAGING GUIDED PORT INSERTION  12/18/2020  . POLYPECTOMY  01/22/2016   Procedure: POLYPECTOMY;  Surgeon: Lucilla Lame, MD;  Location: [facility];  Service: Endoscopy;;  . POLYPECTOMY N/A 04/12/2019   Procedure: POLYPECTOMY;  Surgeon: Lucilla Lame, MD;  Location: [facility];  Service: Endoscopy;  Laterality: N/A;  . VIDEO BRONCHOSCOPY WITH ENDOBRONCHIAL NAVIGATION N/A 11/21/2020   Procedure: VIDEO BRONCHOSCOPY WITH ENDOBRONCHIAL NAVIGATION;  Surgeon: Ottie Glazier, MD;  Location: [facility];  Service: Thoracic;  Laterality: N/A;  .  VIDEO BRONCHOSCOPY WITH ENDOBRONCHIAL ULTRASOUND N/A 11/21/2020   Procedure: VIDEO BRONCHOSCOPY WITH ENDOBRONCHIAL ULTRASOUND;  Surgeon: Ottie Glazier, MD;  Location: [facility];  Service: Thoracic;  Laterality: N/A;    Prior to Admission medications   Medication Sig Start Date End Date Taking? Authorizing Provider  cefdinir (OMNICEF) 300 MG capsule Take 1 capsule (300 mg total) by mouth 2 (two) times daily for 5 days. 01/28/21 02/02/21 Yes Blake Divine, MD  albuterol (VENTOLIN HFA) 108 (90 Base) MCG/ACT inhaler Inhale 2 puffs into the lungs 2 (two) times daily.  12/12/20 12/12/21  [provider]  amLODipine (NORVASC) 5 MG tablet Take 5 mg by mouth at bedtime.    [provider]  atorvastatin (LIPITOR) 10 MG tablet TAKE 1 TABLET BY MOUTH DAILY Patient taking differently: Take 10 mg by mouth at bedtime. 01/28/20   Cletis Athens, MD  dexamethasone (DECADRON) 4 MG tablet Take 1 tablet (4 mg total) by mouth daily. Patient not taking: Reported on 01/20/2021 12/25/20   Noreene Filbert, MD  lansoprazole (PREVACID) 30 MG capsule Take 1 capsule (30 mg total) by mouth daily. Patient not taking: Reported on 01/20/2021 12/25/20   Noreene Filbert, MD  levothyroxine (SYNTHROID, LEVOTHROID) 50 MCG tablet Take 50 mcg by mouth daily before breakfast.    [provider]  lidocaine-prilocaine (EMLA) cream Apply to affected area once 12/18/20   Sindy Guadeloupe, MD  loratadine (CLARITIN) 10 MG tablet Take 10 mg by mouth daily. Reported on 01/22/2016    [provider]  LORazepam (ATIVAN) 0.5 MG tablet Take 1 tablet (0.5 mg total) by mouth every 6 (six) hours as needed for anxiety. 12/17/20   Sindy Guadeloupe, MD  losartan-hydrochlorothiazide (HYZAAR) 100-25 MG tablet Take 1 tablet by mouth daily.    [provider]  magnesium chloride (SLOW-MAG) 64 MG TBEC SR tablet Take 1 tablet (64 mg total) by mouth daily. 01/22/21   Earlie Server, MD  Magnesium Chloride-Calcium 64-106 MG TBEC Take 1 tablet by mouth daily. 01/20/21   Earlie Server, MD  metoprolol succinate (TOPROL-XL) 50 MG 24 hr tablet Take 50 mg by mouth daily. 07/01/14   [provider]  ondansetron (ZOFRAN) 8 MG tablet Take 1 tablet (8 mg total) by mouth 2 (two) times daily as needed for refractory nausea / vomiting. Start on day 3 after carboplatin chemo. 12/18/20   Sindy Guadeloupe, MD  PARoxetine (PAXIL) 10 MG tablet Take 5 mg by mouth every evening.    [provider]  potassium chloride SA (KLOR-CON) 20 MEQ tablet Take 1 tablet (20 mEq total) by mouth daily. 01/20/21   Earlie Server, MD   prochlorperazine (COMPAZINE) 10 MG tablet Take 1 tablet (10 mg total) by mouth every 6 (six) hours as needed (Nausea or vomiting). 12/18/20   Sindy Guadeloupe, MD  sodium chloride 1 g tablet Take 1 tablet (1 g total) by mouth 3 (three) times daily. 01/20/21   Earlie Server, MD    Allergies Augmentin [amoxicillin-pot clavulanate], Codeine, Levaquin [levofloxacin], and Tape  Family History  Problem Relation Age of Onset  . Alzheimer's disease Mother   . Heart attack Mother   . High Cholesterol Mother   . Hypertension Mother   . Heart block Mother   . Lung cancer Father   . Uterine cancer Sister   . Hypertension Sister   . Heart block Sister   . Anxiety disorder Sister   . Breast cancer Neg Hx     Social History Social History  Tobacco Use  . Smoking status: Former Smoker    Packs/day: 0.50    Years: 50.00    Pack years: 25.00    Types: Cigarettes    Quit date: 11/03/2020    Years since quitting: 0.2  . Smokeless tobacco: Never Used  Vaping Use  . Vaping Use: Some days  Substance Use Topics  . Alcohol use: No  . Drug use: Never    Review of Systems  Constitutional: No fever/chills.  Positive for generalized weakness. Eyes: No visual changes. ENT: No sore throat. Cardiovascular: Denies chest pain. Respiratory: Denies shortness of breath. Gastrointestinal: No abdominal pain.  No nausea, no vomiting.  No diarrhea.  No constipation. Genitourinary: Negative for dysuria. Musculoskeletal: Negative for back pain. Skin: Negative for rash. Neurological: Negative for headaches, focal weakness or numbness.  ____________________________________________   PHYSICAL EXAM:  VITAL SIGNS: ED Triage Vitals  Enc Vitals Group     BP 01/28/21 1955 (!) 149/57     Pulse Rate 01/28/21 1955 77     Resp 01/28/21 1955 20     Temp 01/28/21 1955 98.2 F (36.8 C)     Temp Source 01/28/21 1955 Oral     SpO2 01/28/21 1955 100 %     Weight 01/28/21 1956 139 lb (63 kg)     Height 01/28/21  1956 5\' 7"  (1.702 m)     Head Circumference --      Peak Flow --      Pain Score 01/28/21 1956 5     Pain Loc --      Pain Edu? --      Excl. in [place]? --     Constitutional: Alert and oriented. Eyes: Conjunctivae are normal. Head: Atraumatic. Nose: No congestion/rhinnorhea. Mouth/Throat: Mucous membranes are moist. Neck: Normal ROM Cardiovascular: Normal rate, regular rhythm. Grossly normal heart sounds.  2+ radial pulses bilaterally. Respiratory: Normal respiratory effort.  No retractions. Lungs CTAB.  Right chest wall Mediport. Gastrointestinal: Soft and nontender. No distention. Genitourinary: deferred Musculoskeletal: No lower extremity tenderness nor edema. Neurologic:  Normal speech and language. No gross focal neurologic deficits are appreciated. Skin:  Skin is warm, dry and intact. No rash noted. Psychiatric: Mood and affect are normal. Speech and behavior are normal.  ____________________________________________   LABS (all labs ordered are listed, but only abnormal results are displayed)  Labs Reviewed  BASIC METABOLIC PANEL - Abnormal; Notable for the following components:      Result Value   Sodium 124 (*)    Chloride 91 (*)    Glucose, Bld 155 (*)    All other components within normal limits  CBC - Abnormal; Notable for the following components:   WBC 0.5 (*)    RBC 2.42 (*)    Hemoglobin 8.0 (*)    HCT 21.8 (*)    MCHC 36.7 (*)    Platelets 38 (*)    All other components within normal limits  URINALYSIS, COMPLETE (UACMP) WITH MICROSCOPIC - Abnormal; Notable for the following components:   Color, Urine YELLOW (*)    APPearance HAZY (*)    Glucose, UA 50 (*)    Bacteria, UA RARE (*)    All other components within normal limits  MAGNESIUM - Abnormal; Notable for the following components:   Magnesium 1.5 (*)    All other components within normal limits  TROPONIN I (HIGH SENSITIVITY)   ____________________________________________  EKG  ED ECG  REPORT I, Blake Divine, the attending physician, personally viewed and interpreted this ECG.  Date: 01/28/2021  EKG Time: 20:09  Rate: 72  Rhythm: normal sinus rhythm  Axis: Normal  Intervals:none  ST&T Change: None   PROCEDURES  Procedure(s) performed (including Critical Care):  Procedures   ____________________________________________   INITIAL IMPRESSION / ASSESSMENT AND PLAN / ED COURSE       75 year old female with past medical history of hypertension, hyperlipidemia, COPD, and lung cancer metastatic to brain who presents to the ED complaining of generalized weakness for the past 3 to 4 days accompanied by low BP at home.  Patient states that her legs feel weak although she has no focal neurologic deficits on exam.  EKG shows no evidence of arrhythmia or ischemia.  We will screen labs including electrolytes and troponin.  We will also check chest x-ray and UA for signs of infection.  Vital signs here in the ED are reassuring, patient normotensive and we will hold off on IV fluids.  Labs remarkable for hyponatremia at 124, although this is improved from yesterday when it was 121.  Patient appears relatively asymptomatic from this hyponatremia, we will give normal saline bolus.  Potassium within normal limits, patient noted to be hypomagnesemic which we will also replete.  Labs also remarkable for leukopenia, which was discussed with Dr. Rogue Bussing of oncology.  Chest x-ray reviewed by me and shows no infiltrate, edema, or effusion, UA shows no signs of infection.  Oncology recommends 5 days of antibiotic prophylaxis with Levaquin, however patient reports allergy to this and we will treat with cefdinir.  Oncology agrees that patient is appropriate for discharge home with close PCP and oncology follow-up.  Patient counseled to return to the ED for new or worsening symptoms, patient and daughter agree with plan.      ____________________________________________   FINAL  CLINICAL IMPRESSION(S) / ED DIAGNOSES  Final diagnoses:  Generalized weakness  Hyponatremia  Leukopenia, unspecified type     ED Discharge Orders         Ordered    cefdinir (OMNICEF) 300 MG capsule  2 times daily        01/28/21 2320           Note:  This document was prepared using Dragon voice recognition software and may include unintentional dictation errors.   Blake Divine, MD 01/28/21 [phone]

## 2021-01-28 NOTE — Telephone Encounter (Signed)
Daughter called stating that they had called Dr Doy Hutching office yesterday and he did discontinue her bp med, but that her bp and pulse remain low today and she is asking what to do. Please advise

## 2021-01-28 NOTE — Discharge Instructions (Signed)
You should stop taking your amlodipine.  You should also check your blood pressure before taking your losartan-hydrochlorothiazide.  If your blood pressure is less than 120/80 then you should not take the losartan-hydrochlorothiazide.  You should continue taking sodium, potassium, and magnesium supplementation.  Will be prescribed 5 days of antibiotic prophylaxis, please return to the ER for evaluation if you develop fever or any other worsening symptoms.  Please schedule follow-up with your PCP and oncology for recheck.

## 2021-01-29 NOTE — Telephone Encounter (Signed)
Called [facility] office and they got back to me today and said that they thought the pt was taken care of because she went to ER. I asked about the b/p pills and she said that she can't see anything has changed. I called the pt. And she said that he told her that stop amlodopine and then with losartan to only take it if her b/p is 120/80. The pt. Feels good today. She is drinking water, gatorade and eating good. Told her to call if she runs into issues.

## 2021-01-30 ENCOUNTER — Encounter: Payer: Self-pay | Admitting: Oncology

## 2021-01-31 ENCOUNTER — Other Ambulatory Visit: Payer: Self-pay | Admitting: Oncology

## 2021-01-31 MED ORDER — NYSTATIN 100000 UNIT/ML MT SUSP: 5.0000 mL | Freq: Four times a day (QID) | OROMUCOSAL | 0 refills | Status: DC

## 2021-02-02 ENCOUNTER — Inpatient Hospital Stay
Admission: EM | Admit: 2021-02-02 | Discharge: 2021-02-05 | DRG: 808 | Disposition: A | Discharge status: Home / Self Care | Payer: Medicare Other | Attending: Internal Medicine | Admitting: Internal Medicine

## 2021-02-02 ENCOUNTER — Telehealth: Payer: Self-pay | Admitting: Nurse Practitioner

## 2021-02-02 ENCOUNTER — Other Ambulatory Visit: Payer: Self-pay

## 2021-02-02 ENCOUNTER — Emergency Department: Payer: Medicare Other

## 2021-02-02 DIAGNOSIS — E222 Syndrome of inappropriate secretion of antidiuretic hormone: Secondary | ICD-10-CM | POA: Diagnosis present

## 2021-02-02 DIAGNOSIS — J439 Emphysema, unspecified: Secondary | ICD-10-CM | POA: Diagnosis present

## 2021-02-02 DIAGNOSIS — T451X5A Adverse effect of antineoplastic and immunosuppressive drugs, initial encounter: Secondary | ICD-10-CM

## 2021-02-02 DIAGNOSIS — Z801 Family history of malignant neoplasm of trachea, bronchus and lung: Secondary | ICD-10-CM

## 2021-02-02 DIAGNOSIS — R21 Rash and other nonspecific skin eruption: Secondary | ICD-10-CM | POA: Diagnosis present

## 2021-02-02 DIAGNOSIS — D6481 Anemia due to antineoplastic chemotherapy: Secondary | ICD-10-CM

## 2021-02-02 DIAGNOSIS — R509 Fever, unspecified: Secondary | ICD-10-CM

## 2021-02-02 DIAGNOSIS — M79604 Pain in right leg: Secondary | ICD-10-CM

## 2021-02-02 DIAGNOSIS — D649 Anemia, unspecified: Secondary | ICD-10-CM

## 2021-02-02 DIAGNOSIS — Z923 Personal history of irradiation: Secondary | ICD-10-CM

## 2021-02-02 DIAGNOSIS — Z20822 Contact with and (suspected) exposure to covid-19: Secondary | ICD-10-CM | POA: Diagnosis present

## 2021-02-02 DIAGNOSIS — B370 Candidal stomatitis: Secondary | ICD-10-CM | POA: Diagnosis present

## 2021-02-02 DIAGNOSIS — Z818 Family history of other mental and behavioral disorders: Secondary | ICD-10-CM

## 2021-02-02 DIAGNOSIS — R410 Disorientation, unspecified: Secondary | ICD-10-CM | POA: Diagnosis not present

## 2021-02-02 DIAGNOSIS — Z83438 Family history of other disorder of lipoprotein metabolism and other lipidemia: Secondary | ICD-10-CM

## 2021-02-02 DIAGNOSIS — Z90711 Acquired absence of uterus with remaining cervical stump: Secondary | ICD-10-CM

## 2021-02-02 DIAGNOSIS — Z86010 Personal history of colonic polyps: Secondary | ICD-10-CM

## 2021-02-02 DIAGNOSIS — C7931 Secondary malignant neoplasm of brain: Secondary | ICD-10-CM | POA: Diagnosis present

## 2021-02-02 DIAGNOSIS — E861 Hypovolemia: Secondary | ICD-10-CM | POA: Diagnosis present

## 2021-02-02 DIAGNOSIS — D696 Thrombocytopenia, unspecified: Secondary | ICD-10-CM

## 2021-02-02 DIAGNOSIS — Z79899 Other long term (current) drug therapy: Secondary | ICD-10-CM

## 2021-02-02 DIAGNOSIS — Z8049 Family history of malignant neoplasm of other genital organs: Secondary | ICD-10-CM

## 2021-02-02 DIAGNOSIS — D61810 Antineoplastic chemotherapy induced pancytopenia: Principal | ICD-10-CM | POA: Diagnosis present

## 2021-02-02 DIAGNOSIS — G9341 Metabolic encephalopathy: Secondary | ICD-10-CM

## 2021-02-02 DIAGNOSIS — M7989 Other specified soft tissue disorders: Secondary | ICD-10-CM

## 2021-02-02 DIAGNOSIS — R531 Weakness: Secondary | ICD-10-CM

## 2021-02-02 DIAGNOSIS — Z8249 Family history of ischemic heart disease and other diseases of the circulatory system: Secondary | ICD-10-CM

## 2021-02-02 DIAGNOSIS — E039 Hypothyroidism, unspecified: Secondary | ICD-10-CM | POA: Diagnosis present

## 2021-02-02 DIAGNOSIS — Z820 Family history of epilepsy and other diseases of the nervous system: Secondary | ICD-10-CM

## 2021-02-02 DIAGNOSIS — C3490 Malignant neoplasm of unspecified part of unspecified bronchus or lung: Secondary | ICD-10-CM | POA: Diagnosis present

## 2021-02-02 DIAGNOSIS — I82409 Acute embolism and thrombosis of unspecified deep veins of unspecified lower extremity: Secondary | ICD-10-CM

## 2021-02-02 DIAGNOSIS — I10 Essential (primary) hypertension: Secondary | ICD-10-CM | POA: Diagnosis present

## 2021-02-02 DIAGNOSIS — E871 Hypo-osmolality and hyponatremia: Secondary | ICD-10-CM

## 2021-02-02 DIAGNOSIS — C7951 Secondary malignant neoplasm of bone: Secondary | ICD-10-CM | POA: Diagnosis present

## 2021-02-02 DIAGNOSIS — A419 Sepsis, unspecified organism: Secondary | ICD-10-CM | POA: Diagnosis present

## 2021-02-02 DIAGNOSIS — F411 Generalized anxiety disorder: Secondary | ICD-10-CM | POA: Diagnosis present

## 2021-02-02 DIAGNOSIS — Z87891 Personal history of nicotine dependence: Secondary | ICD-10-CM

## 2021-02-02 MED ORDER — LACTATED RINGERS IV BOLUS (SEPSIS)
500.0000 mL | Freq: Once | INTRAVENOUS | Status: AC
  Administered 2021-02-02: 500 mL via INTRAVENOUS

## 2021-02-02 NOTE — Telephone Encounter (Signed)
Patient's daughter called in for report of fever in her mother. Spoke to [place] who reports fever of 100.5. Patient has recently been neutropenic as of 6/8 review of labs. Currently on cefdinir. Concern for neutropenic fever. Recommend evaluation in ER. Will update Dr. Janese Banks.

## 2021-02-02 NOTE — Telephone Encounter (Signed)
I called pt's home and got her voicemail and left message to see if pt still having nose bleeds, if she is we can get her in and look at labs to see what might going on. She can call me or send my chart message.

## 2021-02-02 NOTE — Telephone Encounter (Signed)
Can one of you please call her and see if she will need to be seen and get labs checked?

## 2021-02-02 NOTE — ED Triage Notes (Signed)
Pt in with co generalized weakness since chemo treatment 3 weeks ago for lung ca. Pt states her daughter noted a fever today, also getting radiation.

## 2021-02-03 ENCOUNTER — Emergency Department: Payer: Medicare Other

## 2021-02-03 ENCOUNTER — Inpatient Hospital Stay: Payer: Medicare Other

## 2021-02-03 ENCOUNTER — Encounter: Payer: Self-pay | Admitting: Internal Medicine

## 2021-02-03 DIAGNOSIS — G9341 Metabolic encephalopathy: Secondary | ICD-10-CM

## 2021-02-03 DIAGNOSIS — R531 Weakness: Secondary | ICD-10-CM

## 2021-02-03 DIAGNOSIS — Z87891 Personal history of nicotine dependence: Secondary | ICD-10-CM | POA: Diagnosis not present

## 2021-02-03 DIAGNOSIS — T451X5A Adverse effect of antineoplastic and immunosuppressive drugs, initial encounter: Secondary | ICD-10-CM

## 2021-02-03 DIAGNOSIS — R410 Disorientation, unspecified: Secondary | ICD-10-CM | POA: Diagnosis present

## 2021-02-03 DIAGNOSIS — D696 Thrombocytopenia, unspecified: Secondary | ICD-10-CM

## 2021-02-03 DIAGNOSIS — Z20822 Contact with and (suspected) exposure to covid-19: Secondary | ICD-10-CM | POA: Diagnosis present

## 2021-02-03 DIAGNOSIS — D61810 Antineoplastic chemotherapy induced pancytopenia: Principal | ICD-10-CM

## 2021-02-03 DIAGNOSIS — Z8049 Family history of malignant neoplasm of other genital organs: Secondary | ICD-10-CM | POA: Diagnosis not present

## 2021-02-03 DIAGNOSIS — Z83438 Family history of other disorder of lipoprotein metabolism and other lipidemia: Secondary | ICD-10-CM | POA: Diagnosis not present

## 2021-02-03 DIAGNOSIS — J439 Emphysema, unspecified: Secondary | ICD-10-CM | POA: Diagnosis present

## 2021-02-03 DIAGNOSIS — E222 Syndrome of inappropriate secretion of antidiuretic hormone: Secondary | ICD-10-CM | POA: Diagnosis present

## 2021-02-03 DIAGNOSIS — B370 Candidal stomatitis: Secondary | ICD-10-CM | POA: Diagnosis present

## 2021-02-03 DIAGNOSIS — E871 Hypo-osmolality and hyponatremia: Secondary | ICD-10-CM

## 2021-02-03 DIAGNOSIS — Z801 Family history of malignant neoplasm of trachea, bronchus and lung: Secondary | ICD-10-CM | POA: Diagnosis not present

## 2021-02-03 DIAGNOSIS — Z86010 Personal history of colonic polyps: Secondary | ICD-10-CM | POA: Diagnosis not present

## 2021-02-03 DIAGNOSIS — Z923 Personal history of irradiation: Secondary | ICD-10-CM | POA: Diagnosis not present

## 2021-02-03 DIAGNOSIS — I10 Essential (primary) hypertension: Secondary | ICD-10-CM | POA: Diagnosis present

## 2021-02-03 DIAGNOSIS — C7951 Secondary malignant neoplasm of bone: Secondary | ICD-10-CM | POA: Diagnosis present

## 2021-02-03 DIAGNOSIS — A419 Sepsis, unspecified organism: Secondary | ICD-10-CM | POA: Diagnosis present

## 2021-02-03 DIAGNOSIS — Z8249 Family history of ischemic heart disease and other diseases of the circulatory system: Secondary | ICD-10-CM | POA: Diagnosis not present

## 2021-02-03 DIAGNOSIS — Z818 Family history of other mental and behavioral disorders: Secondary | ICD-10-CM | POA: Diagnosis not present

## 2021-02-03 DIAGNOSIS — Z90711 Acquired absence of uterus with remaining cervical stump: Secondary | ICD-10-CM | POA: Diagnosis not present

## 2021-02-03 DIAGNOSIS — Z820 Family history of epilepsy and other diseases of the nervous system: Secondary | ICD-10-CM | POA: Diagnosis not present

## 2021-02-03 DIAGNOSIS — E039 Hypothyroidism, unspecified: Secondary | ICD-10-CM | POA: Diagnosis present

## 2021-02-03 DIAGNOSIS — R21 Rash and other nonspecific skin eruption: Secondary | ICD-10-CM | POA: Diagnosis present

## 2021-02-03 DIAGNOSIS — F411 Generalized anxiety disorder: Secondary | ICD-10-CM | POA: Diagnosis present

## 2021-02-03 DIAGNOSIS — C7931 Secondary malignant neoplasm of brain: Secondary | ICD-10-CM | POA: Diagnosis present

## 2021-02-03 DIAGNOSIS — C3490 Malignant neoplasm of unspecified part of unspecified bronchus or lung: Secondary | ICD-10-CM | POA: Diagnosis present

## 2021-02-03 LAB — BASIC METABOLIC PANEL
Anion gap: 5 (ref 5–15)
BUN: <5 mg/dL — ABNORMAL LOW (ref 8–23)
CO2: 25 mmol/L (ref 22–32)
Calcium: 8.4 mg/dL — ABNORMAL LOW (ref 8.9–10.3)
Chloride: 97 mmol/L — ABNORMAL LOW (ref 98–111)
Creatinine, Ser: 0.78 mg/dL (ref 0.44–1.00)
GFR, Estimated: >60 mL/min (ref >60)
Glucose, Bld: 85 mg/dL (ref 70–99)
Potassium: 4.4 mmol/L (ref 3.5–5.1)
Sodium: 127 mmol/L — ABNORMAL LOW (ref 135–145)

## 2021-02-03 LAB — CBC WITH DIFFERENTIAL/PLATELET
Abs Immature Granulocytes: 2.8 10*3/uL — ABNORMAL HIGH (ref 0.00–0.07)
Basophils Absolute: 0.1 10*3/uL (ref 0.0–0.1)
Basophils Relative: 1 %
Eosinophils Absolute: 0 10*3/uL (ref 0.0–0.5)
Eosinophils Relative: 0 %
HCT: 18.6 % — ABNORMAL LOW (ref 36.0–46.0)
Hemoglobin: 6.6 g/dL — ABNORMAL LOW (ref 12.0–15.0)
Immature Granulocytes: 20 %
Lymphocytes Relative: 7 %
Lymphs Abs: 1 10*3/uL (ref 0.7–4.0)
MCH: 32.8 pg (ref 26.0–34.0)
MCHC: 35.5 g/dL (ref 30.0–36.0)
MCV: 92.5 fL (ref 80.0–100.0)
Monocytes Absolute: 1.8 10*3/uL — ABNORMAL HIGH (ref 0.1–1.0)
Monocytes Relative: 13 %
Neutro Abs: 8.1 10*3/uL — ABNORMAL HIGH (ref 1.7–7.7)
Neutrophils Relative %: 59 %
Platelets: 12 10*3/uL — CL (ref 150–400)
RBC: 2.01 MIL/uL — ABNORMAL LOW (ref 3.87–5.11)
RDW: 14.3 % (ref 11.5–15.5)
WBC: 13.9 10*3/uL — ABNORMAL HIGH (ref 4.0–10.5)
nRBC: 0 % (ref 0.0–0.2)

## 2021-02-03 LAB — URINALYSIS, COMPLETE (UACMP) WITH MICROSCOPIC
Bacteria, UA: NONE SEEN
Bilirubin Urine: NEGATIVE
Glucose, UA: NEGATIVE mg/dL
Hgb urine dipstick: NEGATIVE
Ketones, ur: NEGATIVE mg/dL
Leukocytes,Ua: NEGATIVE
Nitrite: NEGATIVE
Protein, ur: NEGATIVE mg/dL
Specific Gravity, Urine: 1.001 — ABNORMAL LOW (ref 1.005–1.030)
pH: 8 (ref 5.0–8.0)

## 2021-02-03 LAB — COMPREHENSIVE METABOLIC PANEL
ALT: 17 U/L (ref 0–44)
AST: 18 U/L (ref 15–41)
Albumin: 3.2 g/dL — ABNORMAL LOW (ref 3.5–5.0)
Alkaline Phosphatase: 77 U/L (ref 38–126)
Anion gap: 8 (ref 5–15)
BUN: <5 mg/dL — ABNORMAL LOW (ref 8–23)
CO2: 24 mmol/L (ref 22–32)
Calcium: 8.8 mg/dL — ABNORMAL LOW (ref 8.9–10.3)
Chloride: 92 mmol/L — ABNORMAL LOW (ref 98–111)
Creatinine, Ser: 0.78 mg/dL (ref 0.44–1.00)
GFR, Estimated: >60 mL/min (ref >60)
Glucose, Bld: 96 mg/dL (ref 70–99)
Potassium: 4.7 mmol/L (ref 3.5–5.1)
Sodium: 124 mmol/L — ABNORMAL LOW (ref 135–145)
Total Bilirubin: 0.8 mg/dL (ref 0.3–1.2)
Total Protein: 6 g/dL — ABNORMAL LOW (ref 6.5–8.1)

## 2021-02-03 LAB — PROTIME-INR
INR: 1.1 (ref 0.8–1.2)
Prothrombin Time: 13.9 seconds (ref 11.4–15.2)

## 2021-02-03 LAB — RESP PANEL BY RT-PCR (FLU A&B, COVID) ARPGX2
Influenza A by PCR: NEGATIVE
Influenza B by PCR: NEGATIVE
SARS Coronavirus 2 by RT PCR: NEGATIVE

## 2021-02-03 LAB — CBC
HCT: 17.4 % — ABNORMAL LOW (ref 36.0–46.0)
Hemoglobin: 6.2 g/dL — ABNORMAL LOW (ref 12.0–15.0)
MCH: 33 pg (ref 26.0–34.0)
MCHC: 35.6 g/dL (ref 30.0–36.0)
MCV: 92.6 fL (ref 80.0–100.0)
Platelets: 13 10*3/uL — CL (ref 150–400)
RBC: 1.88 MIL/uL — ABNORMAL LOW (ref 3.87–5.11)
RDW: 14.4 % (ref 11.5–15.5)
WBC: 16 10*3/uL — ABNORMAL HIGH (ref 4.0–10.5)
nRBC: 0 % (ref 0.0–0.2)

## 2021-02-03 LAB — CORTISOL-AM, BLOOD: Cortisol - AM: 9.3 ug/dL (ref 6.7–22.6)

## 2021-02-03 LAB — APTT: aPTT: 32 seconds (ref 24–36)

## 2021-02-03 LAB — LACTIC ACID, PLASMA
Lactic Acid, Venous: 2.3 mmol/L (ref 0.5–1.9)
Lactic Acid, Venous: 1.9 mmol/L (ref 0.5–1.9)

## 2021-02-03 LAB — PROCALCITONIN: Procalcitonin: 0.39 ng/mL

## 2021-02-03 LAB — ABO/RH: ABO/RH(D): O POS

## 2021-02-03 LAB — PREPARE RBC (CROSSMATCH)

## 2021-02-03 MED ORDER — FLUCONAZOLE 50 MG PO TABS
150.0000 mg | ORAL_TABLET | Freq: Every day | ORAL | Status: DC
  Administered 2021-02-03: 150 mg via ORAL
  Filled 2021-02-03: qty 1

## 2021-02-03 MED ORDER — DIPHENHYDRAMINE HCL 12.5 MG/5ML PO ELIX
12.5000 mg | ORAL_SOLUTION | Freq: Once | ORAL | Status: DC
  Filled 2021-02-03: qty 5

## 2021-02-03 MED ORDER — ORAL CARE MOUTH RINSE: 15.0000 mL | Freq: Two times a day (BID) | OROMUCOSAL | Status: DC

## 2021-02-03 MED ORDER — SODIUM CHLORIDE 0.9% IV SOLUTION
Freq: Once | INTRAVENOUS | Status: AC
  Filled 2021-02-03: qty 250

## 2021-02-03 MED ORDER — SODIUM CHLORIDE 0.9 % IV SOLN
2.0000 g | Freq: Once | INTRAVENOUS | Status: AC
  Administered 2021-02-03: 2 g via INTRAVENOUS

## 2021-02-03 MED ORDER — LACTATED RINGERS IV BOLUS (SEPSIS)
1000.0000 mL | Freq: Once | INTRAVENOUS | Status: AC
  Administered 2021-02-03: 1000 mL via INTRAVENOUS

## 2021-02-03 MED ORDER — ONDANSETRON HCL 4 MG PO TABS: 4.0000 mg | ORAL_TABLET | Freq: Four times a day (QID) | ORAL | Status: DC | PRN

## 2021-02-03 MED ORDER — MAGIC MOUTHWASH
15.0000 mL | Freq: Three times a day (TID) | ORAL | Status: DC | PRN
  Filled 2021-02-03: qty 20

## 2021-02-03 MED ORDER — VANCOMYCIN HCL IN DEXTROSE 1-5 GM/200ML-% IV SOLN: 1000.0000 mg | Freq: Once | INTRAVENOUS | Status: DC

## 2021-02-03 MED ORDER — LORAZEPAM 0.5 MG PO TABS
0.5000 mg | ORAL_TABLET | Freq: Two times a day (BID) | ORAL | Status: DC
  Administered 2021-02-03 – 2021-02-04 (×3): 0.5 mg via ORAL
  Filled 2021-02-03 (×3): qty 1

## 2021-02-03 MED ORDER — ORAL CARE MOUTH RINSE
15.0000 mL | Freq: Two times a day (BID) | OROMUCOSAL | Status: DC
  Administered 2021-02-03 – 2021-02-04 (×3): 15 mL via OROMUCOSAL

## 2021-02-03 MED ORDER — IOHEXOL 300 MG/ML  SOLN
75.0000 mL | Freq: Once | INTRAMUSCULAR | Status: AC | PRN
  Administered 2021-02-03: 75 mL via INTRAVENOUS

## 2021-02-03 MED ORDER — CHLORHEXIDINE GLUCONATE 0.12 % MT SOLN
15.0000 mL | Freq: Two times a day (BID) | OROMUCOSAL | Status: DC
  Administered 2021-02-03 – 2021-02-05 (×4): 15 mL via OROMUCOSAL
  Filled 2021-02-03 (×2): qty 15

## 2021-02-03 MED ORDER — NYSTATIN 100000 UNIT/ML MT SUSP
5.0000 mL | Freq: Four times a day (QID) | OROMUCOSAL | Status: DC
  Administered 2021-02-03 – 2021-02-05 (×7): 500000 [IU] via ORAL
  Filled 2021-02-03 (×7): qty 5

## 2021-02-03 MED ORDER — LINEZOLID 600 MG/300ML IV SOLN
600.0000 mg | Freq: Two times a day (BID) | INTRAVENOUS | Status: DC
  Administered 2021-02-03 – 2021-02-04 (×3): 600 mg via INTRAVENOUS
  Filled 2021-02-03 (×5): qty 300

## 2021-02-03 MED ORDER — METRONIDAZOLE 500 MG/100ML IV SOLN
500.0000 mg | Freq: Three times a day (TID) | INTRAVENOUS | Status: DC
  Administered 2021-02-03 – 2021-02-05 (×7): 500 mg via INTRAVENOUS
  Filled 2021-02-03 (×9): qty 100

## 2021-02-03 MED ORDER — ACETAMINOPHEN 325 MG PO TABS: 650.0000 mg | ORAL_TABLET | Freq: Four times a day (QID) | ORAL | Status: DC | PRN

## 2021-02-03 MED ORDER — ONDANSETRON HCL 4 MG/2ML IJ SOLN: 4.0000 mg | Freq: Four times a day (QID) | INTRAMUSCULAR | Status: DC | PRN

## 2021-02-03 MED ORDER — LORAZEPAM 2 MG/ML IJ SOLN
0.5000 mg | Freq: Once | INTRAMUSCULAR | Status: AC
  Administered 2021-02-03: 0.5 mg via INTRAVENOUS
  Filled 2021-02-03: qty 1

## 2021-02-03 MED ORDER — SODIUM CHLORIDE 0.9 % IV SOLN
2.0000 g | Freq: Three times a day (TID) | INTRAVENOUS | Status: DC
  Administered 2021-02-03 – 2021-02-05 (×6): 2 g via INTRAVENOUS
  Filled 2021-02-03 (×9): qty 2

## 2021-02-03 MED ORDER — ACETAMINOPHEN 500 MG PO TABS
1000.0000 mg | ORAL_TABLET | Freq: Once | ORAL | Status: AC
  Administered 2021-02-03: 1000 mg via ORAL
  Filled 2021-02-03: qty 2

## 2021-02-03 MED ORDER — SODIUM CHLORIDE 0.9 % IV SOLN
2.0000 g | Freq: Once | INTRAVENOUS | Status: DC
  Filled 2021-02-03: qty 2

## 2021-02-03 MED ORDER — HYDROCODONE-ACETAMINOPHEN 5-325 MG PO TABS: 1.0000 | ORAL_TABLET | ORAL | Status: DC | PRN

## 2021-02-03 MED ORDER — DIPHENHYDRAMINE HCL 50 MG/ML IJ SOLN
12.5000 mg | Freq: Once | INTRAMUSCULAR | Status: AC
  Administered 2021-02-03: 12.5 mg via INTRAVENOUS
  Filled 2021-02-03: qty 1

## 2021-02-03 MED ORDER — SODIUM CHLORIDE 0.9 % IV SOLN: 2.0000 g | Freq: Two times a day (BID) | INTRAVENOUS | Status: DC

## 2021-02-03 MED ORDER — ACETAMINOPHEN 650 MG RE SUPP: 650.0000 mg | Freq: Four times a day (QID) | RECTAL | Status: DC | PRN

## 2021-02-03 MED ORDER — VANCOMYCIN HCL 1500 MG/300ML IV SOLN
1500.0000 mg | Freq: Once | INTRAVENOUS | Status: AC
  Administered 2021-02-03: 1500 mg via INTRAVENOUS
  Filled 2021-02-03: qty 300

## 2021-02-03 NOTE — Progress Notes (Signed)
Brief hospitalist update note.  This is a nonbillable note.  Please see same-day H&P for full billable details.  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  Status post whole brain radiation and second round of combination chemotherapy.  Presented to the hospital with symptoms of fatigue, intermittent epistaxis and fevers.  Patient reports improvement since admission to the hospital and initiation of IV antibiotics and fluids.  Will continue empiric treatment for suspected sepsis.  After discussion with oncology patient will receive 1 unit PRBC and 1 unit platelets.  Will need irradiated blood products.  Will monitor on IV antibiotics for at least the next 24 hours.  Ralene Muskrat MD

## 2021-02-03 NOTE — Progress Notes (Signed)
Pharmacy Antibiotic Note  Beth Mcmillan is a 75 y.o. female admitted on 02/02/2021 with sepsis from unknown source.  Pharmacy has been consulted for Cefepime dosing.  Plan: Given pt age and borderline CrCl 60.9 (SCr 0.78), ordered Cefepime 2 gm q12h.  Pharmacy will continue to follow and will consider increasing dosing interval to q8h, if CrCl remains > 60.  Height: 5\' 7"  (170.2 cm) Weight: 63.5 kg (140 lb) IBW/kg (Calculated) : 61.6  Temp (24hrs), Avg:99.4 F (37.4 C), Min:98.8 F (37.1 C), Max:100.5 F (38.1 C)  Recent Labs  Lab 01/27/21 0835 01/28/21 2005 02/02/21 2323 02/03/21 0127 02/03/21 0420  WBC  --  0.5* 13.9*  --  16.0*  CREATININE 0.71 0.74 0.78  --   --   LATICACIDVEN  --   --  2.3* 1.9  --     Estimated Creatinine Clearance: 60.9 mL/min (by C-G formula based on SCr of 0.78 mg/dL).    Allergies  Allergen Reactions   Augmentin [Amoxicillin-Pot Clavulanate]     "messes up my blood cells"   Codeine Nausea Only   Levaquin [Levofloxacin]     Affected lab results    Tape Rash    Some bandaids cause rash sometimes    Antimicrobials this admission: 6/14 Vancomycin x 1 6/14 Cefepime >>  6/14 Metronidazole >>  6/14 Linezolid >>  Microbiology results: 06/14 BCx: Pending 06/14 UCx: Pending  Thank you for allowing pharmacy to be a part of this patient's care.  Renda Rolls, PharmD, [facility] 02/03/2021 6:20 AM

## 2021-02-03 NOTE — ED Notes (Signed)
Patient transported to inpatient unit.

## 2021-02-03 NOTE — ED Notes (Signed)
Only able to obtain 2 IV accesses at this time. Will administer blood and platelets as ordered. Will administer antibiotics when platelets are finished.

## 2021-02-03 NOTE — Progress Notes (Signed)
Pharmacy Antibiotic Note  Beth Mcmillan is a 75 y.o. female admitted on 02/02/2021 with sepsis from unknown source.  Patient with metastatic lung cancer.  Chemo - 5/31 carbo and tecentriq, 6/2 etoposide and neulasta onpro. Vancomycin given in ED but notes state developed warm redness on her head, so changed to linezolid. 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  Pharmacy has been consulted for Cefepime dosing.  Plan: Change to cefepime 2gm IV q8h Cautious with linezolid use in setting of profound neutropenia.   Await cultures and monitor renal function  Height: 5\' 7"  (170.2 cm) Weight: 63.5 kg (140 lb) IBW/kg (Calculated) : 61.6  Temp (24hrs), Avg:99.4 F (37.4 C), Min:98.8 F (37.1 C), Max:100.5 F (38.1 C)  Recent Labs  Lab 01/28/21 2005 02/02/21 2323 02/03/21 0127 02/03/21 0420 02/03/21 0450  WBC 0.5* 13.9*  --  16.0*  --   CREATININE 0.74 0.78  --   --  0.78  LATICACIDVEN  --  2.3* 1.9  --   --      Estimated Creatinine Clearance: 60.9 mL/min (by C-G formula based on SCr of 0.78 mg/dL).    Allergies  Allergen Reactions   Augmentin [Amoxicillin-Pot Clavulanate]     "messes up my blood cells"   Codeine Nausea Only   Levaquin [Levofloxacin]     Affected lab results    Tape Rash    Some bandaids cause rash sometimes    Antimicrobials this admission: 6/14 Vancomycin x 1 6/14 Cefepime >>  6/14 Metronidazole >>  6/14 Linezolid >>  Microbiology results: 06/14 BCx: Pending 06/14 UCx: Pending  Thank you for allowing pharmacy to be a part of this patient's care.  Doreene Eland, PharmD, BCPS.   Work Cell: [phone] 02/03/2021 9:30 AM

## 2021-02-03 NOTE — ED Notes (Signed)
Pt was seen have a red rash reaction on her head was warm to touch; pt given benadryl, when rechecked at 0313 the redness and warmth were decreased.

## 2021-02-03 NOTE — Telephone Encounter (Signed)
Pt now admitted

## 2021-02-03 NOTE — ED Notes (Signed)
Admitting MD at bedside.

## 2021-02-03 NOTE — Consult Note (Signed)
Hematology/Oncology Consult note [facility] Telephone:([phone] [phone]  Patient Care Team: Idelle Crouch, MD as PCP - General (Internal Medicine) Telford Nab, RN as Oncology Nurse Navigator Earlie Server, MD as Consulting Physician (Hematology and Oncology)   Name of the patient: Beth Mcmillan  [id number]  1945-10-17    Reason for consult: Pancytopenia and sepsis   Requesting physician: Dr. 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  She also had brain metastases and completed whole brain radiation.  She received second cycle of carbo etoposide chemotherapy on 01/20/2021 and received Neulasta on 01/22/2021.  Patient was brought into the hospital with symptoms of confusion intermittent nosebleeds and generalized fatigue.She was noted to have significant anemia and her hemoglobin dropped from 10.4 on the day of chemotherapy to 6.6 presently.  Platelets down from 150 on the day of chemotherapy to 12 presently.  I met with patient and family at the bedside.  They report she is doing well since hospitalization and mental status has improved.  Her appetite has been poor and she has been feeling quite fatigued since treatment.  She had intermittent nosebleeds last week but none in the last 2 to 3 days.  Bowel movements have been regular.  She was noted to have 1 episode of fever with a temperature of 100.5on admission but no fever since then.  Blood cultures so far have been negative.  Urinalysis negative so far.  Patient noted to have oral thrush and was started on fluconazole.  She is on cefepime and linezolid for sepsis of unknown clear source.  She also received 1 dose of vancomycin which was complicated by skin rash which appears to be resolving.  ECOG PS- 2  Pain scale- 0   Review of systems- Review of Systems  Constitutional:   Positive for fever and malaise/fatigue. Negative for chills and weight loss.  HENT:  Positive for nosebleeds. Negative for congestion and ear discharge.   Eyes:  Negative for blurred vision.  Respiratory:  Negative for cough, hemoptysis, sputum production, shortness of breath and wheezing.   Cardiovascular:  Negative for chest pain, palpitations, orthopnea and claudication.  Gastrointestinal:  Negative for abdominal pain, blood in stool, constipation, diarrhea, heartburn, melena, nausea and vomiting.  Genitourinary:  Negative for dysuria, flank pain, frequency, hematuria and urgency.  Musculoskeletal:  Negative for back pain, joint pain and myalgias.  Skin:  Negative for rash.  Neurological:  Negative for dizziness, tingling, focal weakness, seizures, weakness and headaches.  Endo/Heme/Allergies:  Does not bruise/bleed easily.  Psychiatric/Behavioral:  Negative for depression and suicidal ideas. The patient does not have insomnia.    Allergies  Allergen Reactions   Augmentin [Amoxicillin-Pot Clavulanate]     "messes up my blood cells"   Codeine Nausea Only   Levaquin [Levofloxacin]     Affected lab results    Tape Rash    Some bandaids cause rash sometimes    Patient Active Problem List   Diagnosis Date Noted   Sepsis ([place]) 02/03/2021   Anemia associated with chemotherapy 02/03/2021   Thrombocytopenia ([place]) 02/03/2021   Hyponatremia 00/37/0488   Acute metabolic encephalopathy 89/16/9450   Generalized weakness 02/03/2021   Bone metastases ([place]) 01/01/2021   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 ([place]) 12/26/2020   Goals of care, counseling/discussion 12/13/2020   Small cell lung cancer in adult [facility]) 12/13/2020  Hypothyroidism, acquired 10/02/2019   GAD (generalized anxiety disorder) 10/02/2019   Personal history of colonic polyps    Polyp of transverse colon    Special screening for malignant neoplasms, colon    Benign neoplasm of cecum    Benign neoplasm of  ascending colon      Past Medical History:  Diagnosis Date   Anxiety    Breathing problem    low breathing function 53%   COPD (chronic obstructive pulmonary disease) (HCC)    EMPHYSEMA   Dysrhythmia    tachycardia   Emphysema of lung (HCC)    Hemorrhoid    History of hiatal hernia    SMALL- 2022   Hypercholesteremia    Hypertension    Hypothyroidism    Larynx polyp    Lung cancer (HCC)    Palpitations    with anxiety   Platelets decreased ([place]) 2020   Seasonal allergies    Tinnitus    Vertigo    no episodes in several years   Wears dentures    partial lower   White coat syndrome with hypertension      Past Surgical History:  Procedure Laterality Date   ABDOMINAL HYSTERECTOMY     partial   COLONOSCOPY WITH PROPOFOL N/A 01/22/2016   Procedure: COLONOSCOPY WITH PROPOFOL;  Surgeon: Lucilla Lame, MD;  Location: [place];  Service: Endoscopy;  Laterality: N/A;   COLONOSCOPY WITH PROPOFOL N/A 04/12/2019   Procedure: COLONOSCOPY WITH BIOPSY;  Surgeon: Lucilla Lame, MD;  Location: [place];  Service: Endoscopy;  Laterality: N/A;  pt would like an early appt   ct guided lung bx     IR IMAGING GUIDED PORT INSERTION  12/18/2020   POLYPECTOMY  01/22/2016   Procedure: POLYPECTOMY;  Surgeon: Lucilla Lame, MD;  Location: [place];  Service: Endoscopy;;   POLYPECTOMY N/A 04/12/2019   Procedure: POLYPECTOMY;  Surgeon: Lucilla Lame, MD;  Location: [place];  Service: Endoscopy;  Laterality: N/A;   VIDEO BRONCHOSCOPY WITH ENDOBRONCHIAL NAVIGATION N/A 11/21/2020   Procedure: VIDEO BRONCHOSCOPY WITH ENDOBRONCHIAL NAVIGATION;  Surgeon: Ottie Glazier, MD;  Location: [facility];  Service: Thoracic;  Laterality: N/A;   VIDEO BRONCHOSCOPY WITH ENDOBRONCHIAL ULTRASOUND N/A 11/21/2020   Procedure: VIDEO BRONCHOSCOPY WITH ENDOBRONCHIAL ULTRASOUND;  Surgeon: Ottie Glazier, MD;  Location: [facility];  Service: Thoracic;  Laterality: N/A;    Social History    Socioeconomic History   Marital status: Married    Spouse name: Not on file   Number of children: Not on file   Years of education: Not on file   Highest education level: Not on file  Occupational History   Not on file  Tobacco Use   Smoking status: Former    Packs/day: 0.50    Years: 50.00    Pack years: 25.00    Types: Cigarettes    Quit date: 11/03/2020    Years since quitting: 0.2   Smokeless tobacco: Never  Vaping Use   Vaping Use: Some days  Substance and Sexual Activity   Alcohol use: No   Drug use: Never   Sexual activity: Not on file  Other Topics Concern   Not on file  Social History Narrative   Not on file   Social Determinants of Health   Financial Resource Strain: Not on file  Food Insecurity: Not on file  Transportation Needs: Not on file  Physical Activity: Not on file  Stress: Not on file  Social Connections: Not on file  Intimate Partner  Violence: Not on file     Family History  Problem Relation Age of Onset   Alzheimer's disease Mother    Heart attack Mother    High Cholesterol Mother    Hypertension Mother    Heart block Mother    Lung cancer Father    Uterine cancer Sister    Hypertension Sister    Heart block Sister    Anxiety disorder Sister    Breast cancer Neg Hx      Current Facility-Administered Medications:    acetaminophen (TYLENOL) tablet 650 mg, 650 mg, Oral, Q6H PRN **OR** acetaminophen (TYLENOL) suppository 650 mg, 650 mg, Rectal, Q6H PRN, Athena Masse, MD   ceFEPIme (MAXIPIME) 2 g in sodium chloride 0.9 % 100 mL IVPB, 2 g, Intravenous, Q8H, Zeigler, Sandi Mealy, RPH, Stopped at 02/03/21 1100   chlorhexidine (PERIDEX) 0.12 % solution 15 mL, 15 mL, Mouth Rinse, BID, Damita Dunnings, Waldemar Dickens, MD   fluconazole (DIFLUCAN) tablet 150 mg, 150 mg, Oral, Daily, Sreenath, Sudheer B, MD, 150 mg at 02/03/21 1114   HYDROcodone-acetaminophen (NORCO/VICODIN) 5-325 MG per tablet 1-2 tablet, 1-2 tablet, Oral, Q4H PRN, Athena Masse, MD    linezolid (ZYVOX) IVPB 600 mg, 600 mg, Intravenous, Q12H, Athena Masse, MD, Stopped at 02/03/21 0554   LORazepam (ATIVAN) tablet 0.5 mg, 0.5 mg, Oral, BID, Judd Gaudier V, MD, 0.5 mg at 02/03/21 1114   MEDLINE mouth rinse, 15 mL, Mouth Rinse, q12n4p, Athena Masse, MD, 15 mL at 02/03/21 1616   MEDLINE mouth rinse, 15 mL, Mouth Rinse, q12n4p, Athena Masse, MD   metroNIDAZOLE (FLAGYL) IVPB 500 mg, 500 mg, Intravenous, Q8H, Athena Masse, MD, Last Rate: 100 mL/hr at 02/03/21 1612, 500 mg at 02/03/21 1612   nystatin (MYCOSTATIN) 100000 UNIT/ML suspension 500,000 Units, 5 mL, Oral, QID, Sreenath, Sudheer B, MD, 500,000 Units at 02/03/21 1614   ondansetron (ZOFRAN) tablet 4 mg, 4 mg, Oral, Q6H PRN **OR** ondansetron (ZOFRAN) injection 4 mg, 4 mg, Intravenous, Q6H PRN, Athena Masse, MD   Physical exam:  Vitals:   02/03/21 1408 02/03/21 1409 02/03/21 1551 02/03/21 1552  BP: 128/60 128/60 134/61   Pulse: 72 72 80   Resp: _0 Temp: 97.9 F (36.6 C) 97.9 F (36.6 C) 97.6 F (36.4 C)   TempSrc: Oral Oral Oral   SpO2:   92%   Weight:    146 lb (66.2 kg)  Height:       Physical Exam Constitutional:      Comments: Appears fatigued  HENT:     Mouth/Throat:     Comments: No oral thrush noted at the time of my exam.  Oval area of mucositis/ulceration noted over the posterior aspect of the hard palate on the right side Cardiovascular:     Rate and Rhythm: Normal rate and regular rhythm.     Heart sounds: Normal heart sounds.  Pulmonary:     Effort: Pulmonary effort is normal.     Breath sounds: Normal breath sounds.  Abdominal:     General: Bowel sounds are normal.     Palpations: Abdomen is soft.  Musculoskeletal:     Cervical back: Normal range of motion.  Skin:    General: Skin is warm and dry.  Neurological:     Mental Status: She is alert and oriented to person, place, and time.       CMP Latest Ref Rng & Units 02/03/2021  Glucose 70 - 99 mg/dL 85  BUN 8 - 23  mg/dL <5(L)  Creatinine 0.44 - 1.00 mg/dL 0.78  Sodium 135 - 145 mmol/L 127(L)  Potassium 3.5 - 5.1 mmol/L 4.4  Chloride 98 - 111 mmol/L 97(L)  CO2 22 - 32 mmol/L 25  Calcium 8.9 - 10.3 mg/dL 8.4(L)  Total Protein 6.5 - 8.1 g/dL -  Total Bilirubin 0.3 - 1.2 mg/dL -  Alkaline Phos 38 - 126 U/L -  AST 15 - 41 U/L -  ALT 0 - 44 U/L -   CBC Latest Ref Rng & Units 02/03/2021  WBC 4.0 - 10.5 K/uL 16.0(H)  Hemoglobin 12.0 - 15.0 g/dL 6.2(L)  Hematocrit 36.0 - 46.0 % 17.4(L)  Platelets 150 - 400 K/uL 13(LL)    _0 @  DG Chest 2 View  Result Date: 01/28/2021 CLINICAL DATA:  Weakness. EXAM: CHEST - 2 VIEW COMPARISON:  None. FINDINGS: A right-sided venous Port-A-Cath is seen with its distal tip noted at the junction of the superior vena cava and right atrium. The lungs are hyperinflated. A 1.3 cm x 1.5 cm opacity is seen overlying the left lung base on the frontal view. Mild left basilar linear scarring and/or atelectasis is noted. The heart size and mediastinal contours are within normal limits. The visualized skeletal structures are unremarkable. IMPRESSION: 1. Mild left basilar linear scarring and/or atelectasis. 2. Focal opacity overlying the left lung base which may represent an overlying nipple shadow versus residual left basilar lung mass. Electronically Signed   By: Virgina Norfolk M.D.   On: 01/28/2021 21:37   CT Head W or Wo Contrast  Result Date: 02/03/2021 CLINICAL DATA:  Encephalopathy. EXAM: CT HEAD WITHOUT AND WITH CONTRAST TECHNIQUE: Contiguous axial images were obtained from the base of the skull through the vertex without and with intravenous contrast CONTRAST:  60m OMNIPAQUE IOHEXOL 300 MG/ML  SOLN COMPARISON:  06/05/2013 FINDINGS: Brain: There is no mass, hemorrhage or extra-axial collection. The size and configuration of the ventricles and extra-axial CSF spaces are normal. The brain parenchyma is normal, without acute or chronic infarction. No abnormal contrast enhancement.  Vascular: No abnormal hyperdensity of the major intracranial arteries or dural venous sinuses. No intracranial atherosclerosis. Skull: The visualized skull base, calvarium and extracranial soft tissues are normal. Sinuses/Orbits: No fluid levels or advanced mucosal thickening of the visualized paranasal sinuses. No mastoid or middle ear effusion. The orbits are normal. IMPRESSION: Normal head CT. Electronically Signed   By: KUlyses JarredM.D.   On: 02/03/2021 01:05   UKoreaVenous Img Lower Bilateral (DVT)  Result Date: 02/03/2021 CLINICAL DATA:  75year old female with a history bilateral lower extremity pain and swelling EXAM: BILATERAL LOWER EXTREMITY VENOUS DOPPLER ULTRASOUND TECHNIQUE: Gray-scale sonography with graded compression, as well as color Doppler and duplex ultrasound were performed to evaluate the lower extremity deep venous systems from the level of the common femoral vein and including the common femoral, femoral, profunda femoral, popliteal and calf veins including the posterior tibial, peroneal and gastrocnemius veins when visible. The superficial great saphenous vein was also interrogated. Spectral Doppler was utilized to evaluate flow at rest and with distal augmentation maneuvers in the common femoral, femoral and popliteal veins. COMPARISON:  None. FINDINGS: RIGHT LOWER EXTREMITY Common Femoral Vein: No evidence of thrombus. Normal compressibility, respiratory phasicity and response to augmentation. Saphenofemoral Junction: No evidence of thrombus. Normal compressibility and flow on color Doppler imaging. Profunda Femoral Vein: No evidence of thrombus. Normal compressibility and flow on color Doppler imaging. Femoral Vein: No evidence of thrombus. Normal  compressibility, respiratory phasicity and response to augmentation. Popliteal Vein: No evidence of thrombus. Normal compressibility, respiratory phasicity and response to augmentation. Calf Veins: No evidence of thrombus. Normal  compressibility and flow on color Doppler imaging. Superficial Great Saphenous Vein: No evidence of thrombus. Normal compressibility and flow on color Doppler imaging. Other Findings:  None. LEFT LOWER EXTREMITY Common Femoral Vein: No evidence of thrombus. Normal compressibility, respiratory phasicity and response to augmentation. Saphenofemoral Junction: No evidence of thrombus. Normal compressibility and flow on color Doppler imaging. Profunda Femoral Vein: No evidence of thrombus. Normal compressibility and flow on color Doppler imaging. Femoral Vein: No evidence of thrombus. Normal compressibility, respiratory phasicity and response to augmentation. Popliteal Vein: No evidence of thrombus. Normal compressibility, respiratory phasicity and response to augmentation. Calf Veins: No evidence of thrombus. Normal compressibility and flow on color Doppler imaging. Superficial Great Saphenous Vein: No evidence of thrombus. Normal compressibility and flow on color Doppler imaging. Other Findings:  None. IMPRESSION: Sonographic survey of the bilateral lower extremities negative for DVT Electronically Signed   By: Corrie Mckusick D.O.   On: 02/03/2021 11:38   DG Chest Port 1 View  Result Date: 02/02/2021 CLINICAL DATA:  Questionable sepsis EXAM: PORTABLE CHEST 1 VIEW COMPARISON:  01/28/2021 FINDINGS: Right Port-A-Cath remains in place, unchanged. There is hyperinflation of the lungs compatible with COPD. Heart is normal size. Aortic calcifications. No confluent opacities or effusions. No acute bony abnormality. IMPRESSION: COPD.  No active disease. Electronically Signed   By: Rolm Baptise M.D.   On: 02/02/2021 23:33    Assessment and plan- Patient is a 75 y.o. female with extensive stage small cell lung cancer with bone and brain metastases s/p 2 cycles of carbo etoposide chemotherapy complicated by pancytopenia and acute encephalopathy and possible sepsis  Pancytopenia: Secondary to chemotherapy.  Patient is  already received Neulasta on 01/22/2021 when her white count today is 16 possibly secondary to Neulasta but there may be a component of infection causing leukocytosis as well.  Hemoglobin down to 6.2 when she received 1 unit of PRBC transfusion.  Goal is to keep hemoglobin greater than 7 and platelets greater than 15.  She will also receive 1 unit of platelet transfusion.  She will need irradiated blood products.  If patient is discharged over the next 1 to 2 days we will plan to have a close follow-up at the cancer center and arrange for outpatient transfusions if need be.  Possible sepsis: Source is currently unclear.  Urinalysis so far has been negativeBlood cultures have shown no growth.  Procalcitonin levels were elevated at 0.39 today which will need to be followed up.  Lactic acid levels were coming down.  Patient has remained afebrile after the 1 episode of 38.1 fever yesterday.  Chest x-ray did not show any pneumonia.  If patient continues to remain hemodynamically stable without fever it would be reasonable to discharge her on oral antibiotic such as Levaquin.  Levaquin is listed as an allergy but it is unclear as to what reaction she actually had.  Extensive stage small cell lung cancer: Chemotherapy will be on hold until acute issues resolved.  Oral thrush: Currently resolved.  It would be reasonable to stop fluconazole and continue nystatin instead.  Would also recommend Magic mouthwash for mucositis    Visit Diagnosis 1. Sepsis, due to unspecified organism, unspecified whether acute organ dysfunction present ([place])   2. Delirium   3. 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 Thrombocytopenia ([place])  5. Anemia, unspecified type   6. DVT (deep venous thrombosis) ([place])   7. Bilateral leg pain   8. Leg swelling     Dr. Randa Evens, MD, MPH [facility] [phone] 02/03/2021

## 2021-02-03 NOTE — ED Notes (Signed)
This RN messaged admitting MD to understand POC for the pt at this time.

## 2021-02-03 NOTE — Progress Notes (Signed)
PHARMACY -  BRIEF ANTIBIOTIC NOTE   Pharmacy has received consult(s) for Cefepime and Vancomycin from an ED provider.  The patient's profile has been reviewed for ht/wt/allergies/indication/available labs.    One time order(s) placed for Cefepime 2 gm and Vancomycin 1500 mg per pt wt of 63.5 kg  Further antibiotics/pharmacy consults should be ordered by admitting physician if indicated.                       Renda Rolls, PharmD, [facility] 02/03/2021 12:26 AM

## 2021-02-03 NOTE — Progress Notes (Addendum)
CODE SEPSIS - PHARMACY COMMUNICATION  **Broad Spectrum Antibiotics should be administered within 1 hour of Sepsis diagnosis**  Time Code Sepsis Called/Page Received: 0014  Antibiotics Ordered: Cefepime and Vancomycin  Time of 1st antibiotic administration: 0031  Renda Rolls, PharmD, [facility] 02/03/2021 12:24 AM

## 2021-02-03 NOTE — ED Notes (Signed)
Pt noted to have a red rash on her head; warm to touch; pt was given PO tylenol and had troubles swallowing due to her thrush; pt hooked up to purewick

## 2021-02-03 NOTE — Sepsis Progress Note (Signed)
[place] following for Code Sepsis  

## 2021-02-03 NOTE — ED Provider Notes (Addendum)
[facility] Emergency Department Provider Note  ____________________________________________   Event Date/Time   First MD Initiated Contact with Patient 02/02/21 2317     (approximate)  I have reviewed the triage vital signs and the nursing notes.   HISTORY  Chief Complaint Weakness    HPI Beth Mcmillan is a 75 y.o. 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 Janese Banks.  She presents with her daughter tonight for evaluation of several days of generalized weakness as well as a couple of days of increased confusion and a fever tonight at home of 100.5.  They called the on-call representative for oncology and were told to come immediately to the emergency department.  The patient reports severe generalized weakness but no other symptoms currently.  She denies chest pain or shortness of breath, nausea, vomiting, and abdominal pain.  She has had no dysuria.  She has had a decreased appetite and oral intake.  She does not realize she is confused but her daughter said that she has been confused multiple times recently.  Nothing in particular seems to make her symptoms better or worse.  She had some outpatient labs recently and was pancytopenic in the setting of chemotherapy less than 2 weeks ago.     Past Medical History:  Diagnosis Date   Anxiety    Breathing problem    low breathing function 53%   COPD (chronic obstructive pulmonary disease) (HCC)    EMPHYSEMA   Dysrhythmia    tachycardia   Emphysema of lung (HCC)    Hemorrhoid    History of hiatal hernia    SMALL- 2022   Hypercholesteremia    Hypertension    Hypothyroidism    Larynx polyp    Lung cancer ([place])    Palpitations    with anxiety   Platelets decreased ([place]) 2020   Seasonal allergies    Tinnitus    Vertigo    no episodes in several years   Wears dentures    partial lower    White coat syndrome with hypertension     Patient Active Problem List   Diagnosis Date Noted   Sepsis ([place]) 02/03/2021   Anemia associated with chemotherapy 02/03/2021   Thrombocytopenia ([place]) 02/03/2021   Hyponatremia 02/03/2021   Bone metastases ([place]) 01/01/2021   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   Goals of care, counseling/discussion 12/13/2020   Small cell lung cancer in adult [facility]) 12/13/2020   Hypothyroidism, acquired 10/02/2019   GAD (generalized anxiety disorder) 10/02/2019   Personal history of colonic polyps    Polyp of transverse colon    Special screening for malignant neoplasms, colon    Benign neoplasm of cecum    Benign neoplasm of ascending colon     Past Surgical History:  Procedure Laterality Date   ABDOMINAL HYSTERECTOMY     partial   COLONOSCOPY WITH PROPOFOL N/A 01/22/2016   Procedure: COLONOSCOPY WITH PROPOFOL;  Surgeon: Lucilla Lame, MD;  Location: [place];  Service: Endoscopy;  Laterality: N/A;   COLONOSCOPY WITH PROPOFOL N/A 04/12/2019   Procedure: COLONOSCOPY WITH BIOPSY;  Surgeon: Lucilla Lame, MD;  Location: [place];  Service: Endoscopy;  Laterality: N/A;  pt would like an early appt   ct guided lung bx     IR IMAGING GUIDED PORT INSERTION  12/18/2020   POLYPECTOMY  01/22/2016  Procedure: POLYPECTOMY;  Surgeon: Lucilla Lame, MD;  Location: [place];  Service: Endoscopy;;   POLYPECTOMY N/A 04/12/2019   Procedure: POLYPECTOMY;  Surgeon: Lucilla Lame, MD;  Location: [place];  Service: Endoscopy;  Laterality: N/A;   VIDEO BRONCHOSCOPY WITH ENDOBRONCHIAL NAVIGATION N/A 11/21/2020   Procedure: VIDEO BRONCHOSCOPY WITH ENDOBRONCHIAL NAVIGATION;  Surgeon: Ottie Glazier, MD;  Location: [facility];  Service: Thoracic;  Laterality: N/A;   VIDEO BRONCHOSCOPY WITH ENDOBRONCHIAL ULTRASOUND N/A 11/21/2020   Procedure: VIDEO BRONCHOSCOPY WITH ENDOBRONCHIAL ULTRASOUND;  Surgeon: Ottie Glazier,  MD;  Location: [facility];  Service: Thoracic;  Laterality: N/A;    Prior to Admission medications   Medication Sig Start Date End Date Taking? Authorizing Provider  albuterol (VENTOLIN HFA) 108 (90 Base) MCG/ACT inhaler Inhale 2 puffs into the lungs 2 (two) times daily. 12/12/20 12/12/21  [provider]  amLODipine (NORVASC) 5 MG tablet Take 5 mg by mouth at bedtime.    [provider]  atorvastatin (LIPITOR) 10 MG tablet TAKE 1 TABLET BY MOUTH DAILY Patient taking differently: Take 10 mg by mouth at bedtime. 01/28/20   Cletis Athens, MD  dexamethasone (DECADRON) 4 MG tablet Take 1 tablet (4 mg total) by mouth daily. Patient not taking: Reported on 01/20/2021 12/25/20   Noreene Filbert, MD  lansoprazole (PREVACID) 30 MG capsule Take 1 capsule (30 mg total) by mouth daily. Patient not taking: Reported on 01/20/2021 12/25/20   Noreene Filbert, MD  levothyroxine (SYNTHROID, LEVOTHROID) 50 MCG tablet Take 50 mcg by mouth daily before breakfast.    [provider]  lidocaine-prilocaine (EMLA) cream Apply to affected area once 12/18/20   Sindy Guadeloupe, MD  loratadine (CLARITIN) 10 MG tablet Take 10 mg by mouth daily. Reported on 01/22/2016    [provider]  LORazepam (ATIVAN) 0.5 MG tablet Take 1 tablet (0.5 mg total) by mouth every 6 (six) hours as needed for anxiety. 12/17/20   Sindy Guadeloupe, MD  losartan-hydrochlorothiazide (HYZAAR) 100-25 MG tablet Take 1 tablet by mouth daily.    [provider]  magnesium chloride (SLOW-MAG) 64 MG TBEC SR tablet Take 1 tablet (64 mg total) by mouth daily. 01/22/21   Earlie Server, MD  Magnesium Chloride-Calcium 64-106 MG TBEC Take 1 tablet by mouth daily. 01/20/21   Earlie Server, MD  metoprolol succinate (TOPROL-XL) 50 MG 24 hr tablet Take 50 mg by mouth daily. 07/01/14   [provider]  nystatin (MYCOSTATIN) 100000 UNIT/ML suspension Take 5 mLs (500,000 Units total) by mouth 4 (four) times daily. 01/31/21   Lloyd Huger, MD   ondansetron (ZOFRAN) 8 MG tablet Take 1 tablet (8 mg total) by mouth 2 (two) times daily as needed for refractory nausea / vomiting. Start on day 3 after carboplatin chemo. 12/18/20   Sindy Guadeloupe, MD  PARoxetine (PAXIL) 10 MG tablet Take 5 mg by mouth every evening.    [provider]  potassium chloride SA (KLOR-CON) 20 MEQ tablet Take 1 tablet (20 mEq total) by mouth daily. 01/20/21   Earlie Server, MD  prochlorperazine (COMPAZINE) 10 MG tablet Take 1 tablet (10 mg total) by mouth every 6 (six) hours as needed (Nausea or vomiting). 12/18/20   Sindy Guadeloupe, MD  sodium chloride 1 g tablet Take 1 tablet (1 g total) by mouth 3 (three) times daily. 01/20/21   Earlie Server, MD    Allergies Augmentin [amoxicillin-pot clavulanate], Codeine, Levaquin [levofloxacin], and Tape  Family History  Problem Relation Age of Onset  Alzheimer's disease Mother    Heart attack Mother    High Cholesterol Mother    Hypertension Mother    Heart block Mother    Lung cancer Father    Uterine cancer Sister    Hypertension Sister    Heart block Sister    Anxiety disorder Sister    Breast cancer Neg Hx     Social History Social History   Tobacco Use   Smoking status: Former    Packs/day: 0.50    Years: 50.00    Pack years: 25.00    Types: Cigarettes    Quit date: 11/03/2020    Years since quitting: 0.2   Smokeless tobacco: Never  Vaping Use   Vaping Use: Some days  Substance Use Topics   Alcohol use: No   Drug use: Never    Review of Systems Constitutional: Positive for fever and generalized weakness. Eyes: No visual changes. ENT: No sore throat. Cardiovascular: Denies chest pain. Respiratory: Denies shortness of breath. Gastrointestinal: No abdominal pain.  No nausea, no vomiting.  No diarrhea.  No constipation. Genitourinary: Negative for dysuria. Musculoskeletal: Negative for neck pain.  Negative for back pain. Integumentary: Negative for rash. Neurological: Positive for confusion.   Negative for headaches, focal weakness or numbness.   ____________________________________________   PHYSICAL EXAM:  VITAL SIGNS: ED Triage Vitals  Enc Vitals Group     BP 02/02/21 2313 (!) 139/40     Pulse Rate 02/02/21 2313 86     Resp 02/02/21 2313 20     Temp 02/02/21 2313 98.8 F (37.1 C)     Temp Source 02/02/21 2313 Oral     SpO2 02/02/21 2313 99 %     Weight 02/02/21 2312 63.5 kg (140 lb)     Height 02/02/21 2312 1.702 m (5\' 7" )     Head Circumference --      Peak Flow --      Pain Score 02/02/21 2312 6     Pain Loc --      Pain Edu? --      Excl. in [place]? --     Constitutional: Alert and oriented.  However the patient is confused from time to time although not consistently. Eyes: Conjunctivae are normal.  Head: Atraumatic. Nose: No congestion/rhinnorhea. Mouth/Throat: Patient is wearing a mask. Neck: No stridor.  No meningeal signs.   Cardiovascular: Normal rate, regular rhythm. Good peripheral circulation. Respiratory: Normal respiratory effort.  No retractions. Gastrointestinal: Soft and nontender. No distention.  Musculoskeletal: No lower extremity tenderness nor edema. No gross deformities of extremities. Neurologic:  Normal speech and language. No gross focal neurologic deficits are appreciated.  Skin:  Skin is warm, dry and intact. Psychiatric: Mood and affect are normal. Speech and behavior are normal.  ____________________________________________   LABS (all labs ordered are listed, but only abnormal results are displayed)  Labs Reviewed  LACTIC ACID, PLASMA - Abnormal; Notable for the following components:      Result Value   Lactic Acid, Venous 2.3 (*)    All other components within normal limits  COMPREHENSIVE METABOLIC PANEL - Abnormal; Notable for the following components:   Sodium 124 (*)    Chloride 92 (*)    BUN <5 (*)    Calcium 8.8 (*)    Total Protein 6.0 (*)    Albumin 3.2 (*)    All other components within normal limits  CBC WITH  DIFFERENTIAL/PLATELET - Abnormal; Notable for the following components:   WBC 13.9 (*)  RBC 2.01 (*)    Hemoglobin 6.6 (*)    HCT 18.6 (*)    Platelets 12 (*)    Neutro Abs 8.1 (*)    Monocytes Absolute 1.8 (*)    Abs Immature Granulocytes 2.80 (*)    All other components within normal limits  URINALYSIS, COMPLETE (UACMP) WITH MICROSCOPIC - Abnormal; Notable for the following components:   Color, Urine COLORLESS (*)    APPearance CLEAR (*)    Specific Gravity, Urine 1.001 (*)    All other components within normal limits  RESP PANEL BY RT-PCR (FLU A&B, COVID) ARPGX2  CULTURE, BLOOD (SINGLE)  URINE CULTURE  CULTURE, BLOOD (SINGLE)  LACTIC ACID, PLASMA  PROTIME-INR  APTT  TYPE AND SCREEN   ____________________________________________  EKG  ED ECG REPORT I, Hinda Kehr, the attending physician, personally viewed and interpreted this ECG.  Date: 02/02/2021 EKG Time: 23: 25 Rate: 86 Rhythm: normal sinus rhythm QRS Axis: normal Intervals: normal ST/T Wave abnormalities: Non-specific ST segment / T-wave changes, but no clear evidence of acute ischemia. Narrative Interpretation: no definitive evidence of acute ischemia; does not meet STEMI criteria.  ____________________________________________  RADIOLOGY I, Hinda Kehr, personally viewed and evaluated these images (plain radiographs) as part of my medical decision making, as well as reviewing the written report by the radiologist.  ED MD interpretation: No acute abnormalities on head CT.  No acute abnormalities on chest x-ray  Official radiology report(s): CT Head W or Wo Contrast  Result Date: 02/03/2021 CLINICAL DATA:  Encephalopathy. EXAM: CT HEAD WITHOUT AND WITH CONTRAST TECHNIQUE: Contiguous axial images were obtained from the base of the skull through the vertex without and with intravenous contrast CONTRAST:  42mL OMNIPAQUE IOHEXOL 300 MG/ML  SOLN COMPARISON:  06/05/2013 FINDINGS: Brain: There is no mass,  hemorrhage or extra-axial collection. The size and configuration of the ventricles and extra-axial CSF spaces are normal. The brain parenchyma is normal, without acute or chronic infarction. No abnormal contrast enhancement. Vascular: No abnormal hyperdensity of the major intracranial arteries or dural venous sinuses. No intracranial atherosclerosis. Skull: The visualized skull base, calvarium and extracranial soft tissues are normal. Sinuses/Orbits: No fluid levels or advanced mucosal thickening of the visualized paranasal sinuses. No mastoid or middle ear effusion. The orbits are normal. IMPRESSION: Normal head CT. Electronically Signed   By: Ulyses Jarred M.D.   On: 02/03/2021 01:05   DG Chest Port 1 View  Result Date: 02/02/2021 CLINICAL DATA:  Questionable sepsis EXAM: PORTABLE CHEST 1 VIEW COMPARISON:  01/28/2021 FINDINGS: Right Port-A-Cath remains in place, unchanged. There is hyperinflation of the lungs compatible with COPD. Heart is normal size. Aortic calcifications. No confluent opacities or effusions. No acute bony abnormality. IMPRESSION: COPD.  No active disease. Electronically Signed   By: Rolm Baptise M.D.   On: 02/02/2021 23:33    ____________________________________________   PROCEDURES   Procedure(s) performed (including Critical Care):  .1-3 Lead EKG Interpretation  Date/Time: 02/03/2021 2:09 AM Performed by: Hinda Kehr, MD Authorized by: Hinda Kehr, MD     Interpretation: normal     ECG rate:  90   ECG rate assessment: normal     Rhythm: sinus rhythm     Ectopy: none     Conduction: normal   .Critical Care  Date/Time: 02/03/2021 2:09 AM Performed by: Hinda Kehr, MD Authorized by: Hinda Kehr, MD   Critical care provider statement:    Critical care time (minutes):  45   Critical care time was exclusive of:  Separately billable  procedures and treating other patients   Critical care was necessary to treat or prevent imminent or life-threatening  deterioration of the following conditions:  Sepsis   Critical care was time spent personally by me on the following activities:  Development of treatment plan with patient or surrogate, discussions with consultants, evaluation of patient's response to treatment, examination of patient, obtaining history from patient or surrogate, ordering and performing treatments and interventions, ordering and review of laboratory studies, ordering and review of radiographic studies, pulse oximetry, re-evaluation of patient's condition and review of old charts   ____________________________________________   [place] / MDM / [place] / ED COURSE  As part of my medical decision making, I reviewed the following data within the electronic MEDICAL RECORD NUMBER History obtained from family, Nursing notes reviewed and incorporated, Labs reviewed , EKG interpreted , Old chart reviewed, Discussed with oncology (Ms. Zenia Resides), Discussed with admitting physician (Dr. Damita Dunnings), and reviewed Notes from prior ED visits   Differential diagnosis includes, but is not limited to, set, neutropenic fever, worsening brain metastases with vasogenic edema, CVA, any given source of infection including pneumonia, bacteremia, or UTI.  Patient has no symptoms other than what appears to be delirium and a mild fever in the setting of chemotherapy.  I initiated a "probable sepsis" work-up.  I ordered 1 L lactated Ringer's to start with.  Labs are pending.  I personally reviewed the patient's imaging and agree with the radiologist's interpretation that there is no acute abnormality noted on chest x-ray.  I anticipate treatment empirically with cefepime 2 g IV and vancomycin per pharmacy consult given that she is immunocompromised on chemotherapy.  She had an initial oral temperature which was normal but I asked the nurses to obtain a rectal temperature which was 100.5, consistent with her temperature at home.  I will likely discussed  the case with the on-call representative for oncology.  The patient is on the cardiac monitor to evaluate for evidence of arrhythmia and/or significant heart rate changes.     Clinical Course as of 02/03/21 0238  Tue Feb 03, 2021  0012 Discussed case by phone with Beckey Rutter, NP, who is covering for oncology.  She knows the patient and agreed with my plan for empiric antibiotics of cefepime and vancomycin as previously described.  She also agreed with imaging of the head and stated that they would prefer a CT head with and without contrast.  She agrees with the plan for hospitalist admission.  Have ordered the CT scan and informed the CT technologist about the need for both phases.  Once the rest of her labs are back I will admit her to the hospitalist.  Her comprehensive metabolic panel is stable from prior with normal renal function and persistent hyponatremia at 124.  Her lactic acid is elevated at 2.3 and I will make her code sepsis given to fever, immunocompromise, and elevated lactic acid. [CF]  0055 Urinalysis, Complete w Microscopic(!) Clean urinalysis [CF]  0055 CBC WITH DIFFERENTIAL(!!) Patient is not neutropenic but she has increasing anemia and thrombocytopenia.  I will defer on transfusions to the daytime oncology service recommendations.  Even though she is thrombocytopenic she is not actively bleeding at this time. [CF]  7672 SARS Coronavirus 2 by RT PCR: NEGATIVE [CF]  0119 CT Head W or Wo Contrast Normal, reassuring head CT in this patient with metastatic cancer.  Consulting hospitalist for admission. [CF]  0130 Given the profound thrombocytopenia and anemia, I decided to call  back to oncology.  I discussed the case again with Ms. Allen.  She agreed with the plan for type and screen but also agreed that the patient does not need emergent transfusions tonight either of PRBCs or platelets.  I updated the hospitalist with the verified plan. [CF]  [phone] Of note, while receiving the  vancomycin, the patient reported that her head felt very hot.  On exam her entire scalp is turned bright red and is warm to the touch.  This does not appear to be a classic "red man" reaction to vancomycin, but just to be safe I discontinue the vancomycin and ordered Benadryl 12.5 mg IV.  Even though that is not typically something I would order for an elderly patient I believe a small dose is appropriate at this time.  I updated the hospitalist with the recent development. [CF]    Clinical Course User Index [CF] Hinda Kehr, MD     ____________________________________________  FINAL CLINICAL IMPRESSION(S) / ED DIAGNOSES  Final diagnoses:  Sepsis, due to unspecified organism, unspecified whether acute organ dysfunction present [facility])  Delirium  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[place])  Thrombocytopenia ([place])  Anemia, unspecified type     MEDICATIONS GIVEN DURING THIS VISIT:  Medications  vancomycin (VANCOREADY) IVPB 1500 mg/300 mL (1,500 mg Intravenous New Bag/Given 02/03/21 0059)  diphenhydrAMINE (BENADRYL) injection 12.5 mg (has no administration in time range)  lactated ringers bolus 500 mL (0 mLs Intravenous Stopped 02/03/21 0012)  lactated ringers bolus 1,000 mL (0 mLs Intravenous Stopped 02/03/21 0207)  ceFEPIme (MAXIPIME) 2 g in sodium chloride 0.9 % 100 mL IVPB (0 g Intravenous Stopped 02/03/21 0035)  iohexol (OMNIPAQUE) 300 MG/ML solution 75 mL (75 mLs Intravenous Contrast Given 02/03/21 0035)  acetaminophen (TYLENOL) tablet 1,000 mg (1,000 mg Oral Given 02/03/21 0207)     ED Discharge Orders     None        Note:  This document was prepared using Dragon voice recognition software and may include unintentional dictation errors.   Hinda Kehr, MD 02/03/21 Ninfa Meeker    Hinda Kehr, MD 02/03/21 [phone]

## 2021-02-03 NOTE — H&P (Signed)
History and Physical    [name] BMW:[id number] DOB: June 08, 1946 DOA: 02/02/2021  PCP: Idelle Crouch, MD   Patient coming from: Home  I have personally briefly reviewed patient's old medical records in [place]  Chief Complaint: Weakness, confusion  HPI: Beth Mcmillan is a 75 y.o. 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  Most of the history provided by daughter at the bedside.  States patient had no chest pain or shortness of breath, nausea, vomiting or abdominal pain.  No diarrhea or dysuria.  Does have oral thrush for which she is on treatment and has had a few nosebleeds which her oncologist is aware of.  Daughter also states that she had a tooth pulled on 5/24 in preparation for bisphosphonate treatment and has complained of jaw pain. ED course: On arrival, afebrile, BP 139/40, pulse 86 respirations 20 with O2 sat 99% on room air.  Blood work significant for WBC of 13,900 with lactic acid 2.3>1.9. hemoglobin 6.6 down from baseline 8-10 and platelet count 12,000 down from 38,000 6 days ago and 152000 weeks prior.  Sodium 124 which is not far from her baseline over the past 4 weeks.  COVID and flu negative, urinalysis unremarkable EKG, personally reviewed and interpreted NSR at 86 with nonspecific ST-T wave changes Imaging: Chest x-ray with no active disease CT head normal  The ED provider spoke with oncology on-call who advised to continue antibiotics of cefepime and vancomycin no need for transfusion of blood products Patient developed redness of the scalp with vancomycin for which she was administered Benadryl.  Hospitalist  consulted for admission.   Review of Systems: Unable to obtain due to confusion  Past Medical History:  Diagnosis Date   Anxiety    Breathing problem    low breathing function 53%   COPD (chronic obstructive pulmonary disease) (HCC)    EMPHYSEMA   Dysrhythmia    tachycardia   Emphysema of lung (HCC)    Hemorrhoid    History of hiatal hernia    SMALL- 2022   Hypercholesteremia    Hypertension    Hypothyroidism    Larynx polyp    Lung cancer (HCC)    Palpitations    with anxiety   Platelets decreased ([place]) 2020   Seasonal allergies    Tinnitus    Vertigo    no episodes in several years   Wears dentures    partial lower   White coat syndrome with hypertension     Past Surgical History:  Procedure Laterality Date   ABDOMINAL HYSTERECTOMY     partial   COLONOSCOPY WITH PROPOFOL N/A 01/22/2016   Procedure: COLONOSCOPY WITH PROPOFOL;  Surgeon: Lucilla Lame, MD;  Location: [facility];  Service: Endoscopy;  Laterality: N/A;   COLONOSCOPY WITH PROPOFOL N/A 04/12/2019   Procedure: COLONOSCOPY WITH BIOPSY;  Surgeon: Lucilla Lame, MD;  Location: [facility];  Service: Endoscopy;  Laterality: N/A;  pt would like an early appt   ct guided lung bx     IR IMAGING GUIDED PORT INSERTION  12/18/2020   POLYPECTOMY  01/22/2016   Procedure: POLYPECTOMY;  Surgeon: Lucilla Lame, MD;  Location: [facility];  Service: Endoscopy;;   POLYPECTOMY N/A 04/12/2019   Procedure: POLYPECTOMY;  Surgeon: Lucilla Lame, MD;  Location: [facility];  Service: Endoscopy;  Laterality: N/A;   VIDEO BRONCHOSCOPY WITH ENDOBRONCHIAL NAVIGATION N/A 11/21/2020   Procedure: VIDEO BRONCHOSCOPY WITH ENDOBRONCHIAL NAVIGATION;  Surgeon: Ottie Glazier, MD;  Location: [facility];  Service: Thoracic;  Laterality: N/A;   VIDEO BRONCHOSCOPY WITH ENDOBRONCHIAL ULTRASOUND N/A 11/21/2020   Procedure: VIDEO BRONCHOSCOPY WITH ENDOBRONCHIAL ULTRASOUND;  Surgeon: Ottie Glazier, MD;  Location: [facility];   Service: Thoracic;  Laterality: N/A;     reports that she quit smoking about 3 months ago. Her smoking use included cigarettes. She has a 25.00 pack-year smoking history. She has never used smokeless tobacco. She reports that she does not drink alcohol and does not use drugs.  Allergies  Allergen Reactions   Augmentin [Amoxicillin-Pot Clavulanate]     "messes up my blood cells"   Codeine Nausea Only   Levaquin [Levofloxacin]     Affected lab results    Tape Rash    Some bandaids cause rash sometimes    Family History  Problem Relation Age of Onset   Alzheimer's disease Mother    Heart attack Mother    High Cholesterol Mother    Hypertension Mother    Heart block Mother    Lung cancer Father    Uterine cancer Sister    Hypertension Sister    Heart block Sister    Anxiety disorder Sister    Breast cancer Neg Hx       Prior to Admission medications   Medication Sig Start Date End Date Taking? Authorizing Provider  albuterol (VENTOLIN HFA) 108 (90 Base) MCG/ACT inhaler Inhale 2 puffs into the lungs 2 (two) times daily. 12/12/20 12/12/21  [provider]  amLODipine (NORVASC) 5 MG tablet Take 5 mg by mouth at bedtime.    [provider]  atorvastatin (LIPITOR) 10 MG tablet TAKE 1 TABLET BY MOUTH DAILY Patient taking differently: Take 10 mg by mouth at bedtime. 01/28/20   Beth Athens, MD  dexamethasone (DECADRON) 4 MG tablet Take 1 tablet (4 mg total) by mouth daily. Patient not taking: Reported on 01/20/2021 12/25/20   Noreene Filbert, MD  lansoprazole (PREVACID) 30 MG capsule Take 1 capsule (30 mg total) by mouth daily. Patient not taking: Reported on 01/20/2021 12/25/20   Noreene Filbert, MD  levothyroxine (SYNTHROID, LEVOTHROID) 50 MCG tablet Take 50 mcg by mouth daily before breakfast.    [provider]  lidocaine-prilocaine (EMLA) cream Apply to affected area once 12/18/20   Sindy Guadeloupe, MD  loratadine (CLARITIN) 10 MG tablet Take 10 mg by mouth  daily. Reported on 01/22/2016    [provider]  LORazepam (ATIVAN) 0.5 MG tablet Take 1 tablet (0.5 mg total) by mouth every 6 (six) hours as needed for anxiety. 12/17/20   Sindy Guadeloupe, MD  losartan-hydrochlorothiazide (HYZAAR) 100-25 MG tablet Take 1 tablet by mouth daily.    [provider]  magnesium chloride (SLOW-MAG) 64 MG TBEC SR tablet Take 1 tablet (64 mg total) by mouth daily. 01/22/21   Earlie Server, MD  Magnesium Chloride-Calcium 64-106 MG TBEC Take 1 tablet by mouth daily. 01/20/21   Earlie Server, MD  metoprolol succinate (TOPROL-XL) 50 MG 24 hr tablet Take 50 mg by mouth daily. 07/01/14   [provider]  nystatin (MYCOSTATIN) 100000 UNIT/ML suspension Take 5 mLs (500,000 Units total) by mouth 4 (four) times daily. 01/31/21   Delight Hoh  J, MD  ondansetron (ZOFRAN) 8 MG tablet Take 1 tablet (8 mg total) by mouth 2 (two) times daily as needed for refractory nausea / vomiting. Start on day 3 after carboplatin chemo. 12/18/20   Sindy Guadeloupe, MD  PARoxetine (PAXIL) 10 MG tablet Take 5 mg by mouth every evening.    [provider]  potassium chloride SA (KLOR-CON) 20 MEQ tablet Take 1 tablet (20 mEq total) by mouth daily. 01/20/21   Earlie Server, MD  prochlorperazine (COMPAZINE) 10 MG tablet Take 1 tablet (10 mg total) by mouth every 6 (six) hours as needed (Nausea or vomiting). 12/18/20   Sindy Guadeloupe, MD  sodium chloride 1 g tablet Take 1 tablet (1 g total) by mouth 3 (three) times daily. 01/20/21   Earlie Server, MD    Physical Exam: Vitals:   02/02/21 2335 02/03/21 0103 02/03/21 0157 02/03/21 0200  BP:  (!) 130/49  (!) 128/49  Pulse:  92  100  Resp:  17  15  Temp: (!) 100.5 F (38.1 C)  99 F (37.2 C)   TempSrc: Rectal  Oral   SpO2:  99%  99%  Weight:      Height:         Vitals:   02/02/21 2335 02/03/21 0103 02/03/21 0157 02/03/21 0200  BP:  (!) 130/49  (!) 128/49  Pulse:  92  100  Resp:  17  15  Temp: (!) 100.5 F (38.1 C)  99 F (37.2 C)    TempSrc: Rectal  Oral   SpO2:  99%  99%  Weight:      Height:          Constitutional: Alert and oriented x 1 . Not in any apparent distres HEENT:      Head: Normocephalic and atraumatic.         Eyes: PERLA, EOMI, Conjunctivae are normal. Sclera is non-icteric.       Mouth/Throat: Mucous membranes are moist. No thrush seen      Neck: Supple with no signs of meningismus. Cardiovascular: Regular rate and rhythm. No murmurs, gallops, or rubs. 2+ symmetrical distal pulses are present . No JVD. No LE edema Respiratory: Respiratory effort normal .Lungs sounds clear bilaterally. No wheezes, crackles, or rhonchi.  Gastrointestinal: Soft, non tender, and non distended with positive bowel sounds.  Genitourinary: No CVA tenderness. Musculoskeletal: Nontender with normal range of motion in all extremities. No cyanosis, or erythema of extremities. Neurologic:  Face is symmetric. Moving all extremities. No gross focal neurologic deficits . Skin: Skin is warm, dry.  No rash or ulcers Psychiatric: Mood and affect are normal    Labs on Admission: I have personally reviewed following labs and imaging studies  CBC: Recent Labs  Lab 01/28/21 2005 02/02/21 2323  WBC 0.5* 13.9*  NEUTROABS  --  8.1*  HGB 8.0* 6.6*  HCT 21.8* 18.6*  MCV 90.1 92.5  PLT 38* 12*   Basic Metabolic Panel: Recent Labs  Lab 01/27/21 0835 01/28/21 2005 02/02/21 2323  NA 121* 124* 124*  K 3.4* 4.1 4.7  CL 85* 91* 92*  CO2 24 26 24   GLUCOSE 109* 155* 96  BUN 15 14 <5*  CREATININE 0.71 0.74 0.78  CALCIUM 8.3* 9.0 8.8*  MG 1.4* 1.5*  --    GFR: Estimated Creatinine Clearance: 60.9 mL/min (by C-G formula based on SCr of 0.78 mg/dL). Liver Function Tests: Recent Labs  Lab 01/27/21 0835 02/02/21 2323  AST 19 18  ALT 17 17  ALKPHOS 54 77  BILITOT 1.2 0.8  PROT 5.4* 6.0*  ALBUMIN 3.0* 3.2*   No results for input(s): LIPASE, AMYLASE in the last 168 hours. No results for input(s): AMMONIA in the last 168  hours. Coagulation Profile: Recent Labs  Lab 02/02/21 2323  INR 1.1   Cardiac Enzymes: No results for input(s): CKTOTAL, CKMB, CKMBINDEX, TROPONINI in the last 168 hours. BNP (last 3 results) No results for input(s): PROBNP in the last 8760 hours. HbA1C: No results for input(s): HGBA1C in the last 72 hours. CBG: No results for input(s): GLUCAP in the last 168 hours. Lipid Profile: No results for input(s): CHOL, HDL, LDLCALC, TRIG, CHOLHDL, LDLDIRECT in the last 72 hours. Thyroid Function Tests: No results for input(s): TSH, T4TOTAL, FREET4, T3FREE, THYROIDAB in the last 72 hours. Anemia Panel: No results for input(s): VITAMINB12, FOLATE, FERRITIN, TIBC, IRON, RETICCTPCT in the last 72 hours. Urine analysis:    Component Value Date/Time   COLORURINE COLORLESS (A) 02/03/2021 0015   APPEARANCEUR CLEAR (A) 02/03/2021 0015   LABSPEC 1.001 (L) 02/03/2021 0015   PHURINE 8.0 02/03/2021 0015   GLUCOSEU NEGATIVE 02/03/2021 0015   HGBUR NEGATIVE 02/03/2021 0015   BILIRUBINUR NEGATIVE 02/03/2021 0015   KETONESUR NEGATIVE 02/03/2021 0015   PROTEINUR NEGATIVE 02/03/2021 0015   NITRITE NEGATIVE 02/03/2021 0015   LEUKOCYTESUR NEGATIVE 02/03/2021 0015    Radiological Exams on Admission: CT Head W or Wo Contrast  Result Date: 02/03/2021 CLINICAL DATA:  Encephalopathy. EXAM: CT HEAD WITHOUT AND WITH CONTRAST TECHNIQUE: Contiguous axial images were obtained from the base of the skull through the vertex without and with intravenous contrast CONTRAST:  18mL OMNIPAQUE IOHEXOL 300 MG/ML  SOLN COMPARISON:  06/05/2013 FINDINGS: Brain: There is no mass, hemorrhage or extra-axial collection. The size and configuration of the ventricles and extra-axial CSF spaces are normal. The brain parenchyma is normal, without acute or chronic infarction. No abnormal contrast enhancement. Vascular: No abnormal hyperdensity of the major intracranial arteries or dural venous sinuses. No intracranial atherosclerosis.  Skull: The visualized skull base, calvarium and extracranial soft tissues are normal. Sinuses/Orbits: No fluid levels or advanced mucosal thickening of the visualized paranasal sinuses. No mastoid or middle ear effusion. The orbits are normal. IMPRESSION: Normal head CT. Electronically Signed   By: Ulyses Jarred M.D.   On: 02/03/2021 01:05   DG Chest Port 1 View  Result Date: 02/02/2021 CLINICAL DATA:  Questionable sepsis EXAM: PORTABLE CHEST 1 VIEW COMPARISON:  01/28/2021 FINDINGS: Right Port-A-Cath remains in place, unchanged. There is hyperinflation of the lungs compatible with COPD. Heart is normal size. Aortic calcifications. No confluent opacities or effusions. No acute bony abnormality. IMPRESSION: COPD.  No active disease. Electronically Signed   By: Rolm Baptise M.D.   On: 02/02/2021 23:33     Assessment/Plan 75 year old female with a history of small cell lung cancer metastatic to the brain and bone, currently undergoing chemotherapy and radiation,  hypothyroidism and generalized anxiety disorder presenting with fever, confusion and weakness.      Sepsis of unknown source   Acute metabolic encephalopathy   Generalized weakness - Patient presents with fever, confusion and weakness, leukocytosis of 13,900 with lactic acid 2.3>1.9 - Head CT with no acute findings - Continue IV sepsis fluids - Continue cefepime and vancomycin (switched to Zosyn after patient developed redness of scalp treated with Benadryl in the ED) - Follow cultures - Neurologic checks, aspiration precautions    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    Anemia  associated with chemotherapy   Thrombocytopenia (HCC) - Platelet count 12,000 with hemoglobin 6.6 likely related to chemotherapy - Oncology was consulted overnight from the ED.  No blood product transfusion for right now - Patient with no active signs of bleeding - Continue to monitor    Hyponatremia, chronic - Sodium of 124, 121-129 over the  past month - Likely hypovolemic from poor oral intake - IV hydration with normal saline and continue to monitor    GAD (generalized anxiety disorder) - Continue lorazepam    Hypothyroidism, acquired - Continue levothyroxine  Essential hypertension - Continue losartan and metoprolol and amlodipine pending med rec    DVT prophylaxis: SCDs Code Status: full code  Family Communication:  none  Disposition Plan: Back to previous home environment Consults called: none  Status:At the time of admission, it appears that the appropriate admission status for this patient is INPATIENT. This is judged to be reasonable and necessary in order to provide the required intensity of service to ensure the patient's safety given the presenting symptoms, physical exam findings, and initial radiographic and laboratory data in the context of their  Comorbid conditions.   Patient requires inpatient status due to high intensity of service, high risk for further deterioration and high frequency of surveillance required.   I certify that at the point of admission it is my clinical judgment that the patient will require inpatient hospital care spanning beyond [place] MD [facility]     02/03/2021, 3:08 AM

## 2021-02-04 ENCOUNTER — Other Ambulatory Visit: Payer: Self-pay | Admitting: Nurse Practitioner

## 2021-02-04 DIAGNOSIS — E871 Hypo-osmolality and hyponatremia: Secondary | ICD-10-CM

## 2021-02-04 DIAGNOSIS — A419 Sepsis, unspecified organism: Secondary | ICD-10-CM

## 2021-02-04 LAB — BASIC METABOLIC PANEL
Anion gap: 8 (ref 5–15)
BUN: 6 mg/dL — ABNORMAL LOW (ref 8–23)
CO2: 25 mmol/L (ref 22–32)
Calcium: 8.1 mg/dL — ABNORMAL LOW (ref 8.9–10.3)
Chloride: 96 mmol/L — ABNORMAL LOW (ref 98–111)
Creatinine, Ser: 0.81 mg/dL (ref 0.44–1.00)
GFR, Estimated: >60 mL/min (ref >60)
Glucose, Bld: 96 mg/dL (ref 70–99)
Potassium: 4 mmol/L (ref 3.5–5.1)
Sodium: 129 mmol/L — ABNORMAL LOW (ref 135–145)

## 2021-02-04 LAB — CBC WITH DIFFERENTIAL/PLATELET
Abs Immature Granulocytes: 5.07 10*3/uL — ABNORMAL HIGH (ref 0.00–0.07)
Basophils Absolute: 0 10*3/uL (ref 0.0–0.1)
Basophils Relative: 0 %
Eosinophils Absolute: 0 10*3/uL (ref 0.0–0.5)
Eosinophils Relative: 0 %
HCT: 20.5 % — ABNORMAL LOW (ref 36.0–46.0)
Hemoglobin: 7.7 g/dL — ABNORMAL LOW (ref 12.0–15.0)
Immature Granulocytes: 24 %
Lymphocytes Relative: 6 %
Lymphs Abs: 1.2 10*3/uL (ref 0.7–4.0)
MCH: 33.3 pg (ref 26.0–34.0)
MCHC: 37.6 g/dL — ABNORMAL HIGH (ref 30.0–36.0)
MCV: 88.7 fL (ref 80.0–100.0)
Monocytes Absolute: 3.1 10*3/uL — ABNORMAL HIGH (ref 0.1–1.0)
Monocytes Relative: 14 %
Neutro Abs: 11.9 10*3/uL — ABNORMAL HIGH (ref 1.7–7.7)
Neutrophils Relative %: 56 %
Platelets: 43 10*3/uL — ABNORMAL LOW (ref 150–400)
RBC: 2.31 MIL/uL — ABNORMAL LOW (ref 3.87–5.11)
RDW: 14.6 % (ref 11.5–15.5)
Smear Review: NORMAL
WBC: 21.2 10*3/uL — ABNORMAL HIGH (ref 4.0–10.5)
nRBC: 0 % (ref 0.0–0.2)

## 2021-02-04 LAB — URINE CULTURE: Culture: NO GROWTH

## 2021-02-04 LAB — BPAM PLATELET PHERESIS
Blood Product Expiration Date: 202206152359
ISSUE DATE / TIME: 202206141051
Unit Type and Rh: 1700

## 2021-02-04 LAB — PREPARE PLATELET PHERESIS: Unit division: 0

## 2021-02-04 MED ORDER — SODIUM CHLORIDE 0.9 % IV SOLN
INTRAVENOUS | Status: DC | PRN
  Administered 2021-02-04: 250 mL via INTRAVENOUS

## 2021-02-04 MED ORDER — ADULT MULTIVITAMIN W/MINERALS CH
1.0000 | ORAL_TABLET | Freq: Every day | ORAL | Status: DC
  Administered 2021-02-04 – 2021-02-05 (×2): 1 via ORAL
  Filled 2021-02-04: qty 1

## 2021-02-04 MED ORDER — CHLORHEXIDINE GLUCONATE CLOTH 2 % EX PADS
6.0000 | MEDICATED_PAD | Freq: Every day | CUTANEOUS | Status: DC
  Administered 2021-02-04 – 2021-02-05 (×2): 6 via TOPICAL

## 2021-02-04 MED ORDER — PROSOURCE PLUS PO LIQD
30.0000 mL | Freq: Two times a day (BID) | ORAL | Status: DC
  Administered 2021-02-04: 30 mL via ORAL
  Filled 2021-02-04 (×3): qty 30

## 2021-02-04 MED ORDER — SODIUM CHLORIDE 0.9% FLUSH
10.0000 mL | Freq: Two times a day (BID) | INTRAVENOUS | Status: DC
  Administered 2021-02-04 – 2021-02-05 (×2): 10 mL

## 2021-02-04 MED ORDER — SODIUM CHLORIDE 0.9% FLUSH: 10.0000 mL | INTRAVENOUS | Status: DC | PRN

## 2021-02-04 NOTE — Progress Notes (Addendum)
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   Interval history- Feels 'much better'. Energy has improved. Eating and drinking. Feels ready to go home. No abnormal bleeding or bruising. No fevers. Continues antibiotics. Confusion has resolved. Hasn't received any additional blood or platelets beyond the one unit of each at admission. Patient's spouse is at bedside.    Review of Systems  Constitutional:  Negative for chills, fever, malaise/fatigue and weight loss.  HENT:  Negative for hearing loss, nosebleeds, sore throat and tinnitus.   Eyes:  Negative for blurred vision and double vision.  Respiratory:  Negative for cough, hemoptysis, shortness of breath and wheezing.   Cardiovascular:  Negative for chest pain, palpitations and leg swelling.  Gastrointestinal:  Negative for abdominal pain, blood in stool, constipation, diarrhea, melena, nausea and vomiting.  Genitourinary:  Negative for dysuria and urgency.  Musculoskeletal:  Negative for back pain, falls, joint pain and myalgias.  Skin:  Negative for itching and rash.  Neurological:  Negative for dizziness, tingling, sensory change, loss of consciousness, weakness and headaches.  Endo/Heme/Allergies:  Negative for environmental allergies. Does not bruise/bleed easily.  Psychiatric/Behavioral:  Negative for depression.  The patient is not nervous/anxious and does not have insomnia.    Allergies  Allergen Reactions   Augmentin [Amoxicillin-Pot Clavulanate]     "messes up my blood cells"   Codeine Nausea Only   Levaquin [Levofloxacin]     Affected lab results    Tape Rash    Some bandaids cause rash sometimes    Patient Active Problem List   Diagnosis Date Noted   Sepsis ([place]) 02/03/2021   Anemia associated with chemotherapy 02/03/2021   Thrombocytopenia ([place]) 02/03/2021   Hyponatremia 02/54/2706   Acute metabolic encephalopathy 23/76/2831   Generalized weakness 02/03/2021   Antineoplastic chemotherapy induced pancytopenia ([place])    Bone metastases ([place]) 01/01/2021   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 ([place]) 12/26/2020   Goals of care, counseling/discussion 12/13/2020   Small cell lung cancer in adult [facility]) 12/13/2020   Hypothyroidism, acquired 10/02/2019   GAD (generalized anxiety disorder) 10/02/2019   Personal history of colonic polyps    Polyp of transverse colon    Special screening for malignant neoplasms, colon    Benign neoplasm of cecum    Benign neoplasm of ascending colon      Past Medical History:  Diagnosis Date   Anxiety    Breathing problem    low breathing function 53%   COPD (chronic obstructive pulmonary disease) (HCC)    EMPHYSEMA   Dysrhythmia    tachycardia   Emphysema of lung (HCC)    Hemorrhoid    History of hiatal hernia    SMALL- 2022   Hypercholesteremia    Hypertension    Hypothyroidism    Larynx polyp    Lung cancer ([place])    Palpitations    with anxiety   Platelets decreased ([place]) 2020   Seasonal  allergies    Tinnitus    Vertigo    no episodes in several years   Wears dentures    partial lower   White coat syndrome with hypertension      Past Surgical History:  Procedure Laterality Date   ABDOMINAL HYSTERECTOMY     partial   COLONOSCOPY WITH PROPOFOL N/A 01/22/2016   Procedure: COLONOSCOPY WITH PROPOFOL;  Surgeon: Lucilla Lame, MD;  Location: [place];  Service: Endoscopy;  Laterality: N/A;   COLONOSCOPY WITH PROPOFOL N/A 04/12/2019   Procedure: COLONOSCOPY WITH BIOPSY;  Surgeon: Lucilla Lame, MD;  Location: [place];  Service: Endoscopy;  Laterality: N/A;  pt would like an early appt   ct guided lung bx     IR IMAGING GUIDED PORT INSERTION  12/18/2020   POLYPECTOMY  01/22/2016   Procedure: POLYPECTOMY;  Surgeon: Lucilla Lame, MD;  Location: [place];  Service: Endoscopy;;   POLYPECTOMY N/A 04/12/2019   Procedure: POLYPECTOMY;  Surgeon: Lucilla Lame, MD;  Location: [place];  Service: Endoscopy;  Laterality: N/A;   VIDEO BRONCHOSCOPY WITH ENDOBRONCHIAL NAVIGATION N/A 11/21/2020   Procedure: VIDEO BRONCHOSCOPY WITH ENDOBRONCHIAL NAVIGATION;  Surgeon: Ottie Glazier, MD;  Location: [facility];  Service: Thoracic;  Laterality: N/A;   VIDEO BRONCHOSCOPY WITH ENDOBRONCHIAL ULTRASOUND N/A 11/21/2020   Procedure: VIDEO BRONCHOSCOPY WITH ENDOBRONCHIAL ULTRASOUND;  Surgeon: Ottie Glazier, MD;  Location: [facility];  Service: Thoracic;  Laterality: N/A;    Social History   Socioeconomic History   Marital status: Married    Spouse name: Not on file   Number of children: Not on file   Years of education: Not on file   Highest education level: Not on file  Occupational History   Not on file  Tobacco Use   Smoking status: Former    Packs/day: 0.50    Years: 50.00    Pack years: 25.00    Types: Cigarettes    Quit date: 11/03/2020    Years since quitting: 0.2   Smokeless tobacco: Never  Vaping Use   Vaping Use: Some days  Substance and Sexual Activity   Alcohol use: No   Drug use: Never   Sexual activity: Not on file  Other Topics Concern   Not on file  Social History Narrative   Not on file   Social Determinants of Health   Financial Resource Strain: Not on file  Food Insecurity: Not on file  Transportation Needs: Not on file  Physical Activity: Not on file  Stress:  Not on file  Social Connections: Not on file  Intimate Partner Violence: Not on file    Family History  Problem Relation Age of Onset   Alzheimer's disease Mother    Heart attack Mother    High Cholesterol Mother    Hypertension Mother    Heart block Mother    Lung cancer Father    Uterine cancer Sister    Hypertension Sister    Heart block Sister    Anxiety disorder Sister    Breast cancer Neg Hx      Current Facility-Administered Medications:    (feeding supplement) PROSource Plus liquid 30 mL, 30 mL, Oral, BID BM, Sreenath, Sudheer B, MD   acetaminophen (TYLENOL) tablet 650 mg, 650 mg, Oral, Q6H PRN **OR** acetaminophen (TYLENOL) suppository 650 mg, 650 mg, Rectal, Q6H PRN, Athena Masse, MD   ceFEPIme (MAXIPIME) 2 g in sodium chloride 0.9 % 100 mL IVPB, 2 g, Intravenous, Q8H, Zeigler, Sandi Mealy, RPH,  Last Rate: 200 mL/hr at 02/04/21 1401, 2 g at 02/04/21 1401   chlorhexidine (PERIDEX) 0.12 % solution 15 mL, 15 mL, Mouth Rinse, BID, Athena Masse, MD, 15 mL at 02/03/21 2036   HYDROcodone-acetaminophen (NORCO/VICODIN) 5-325 MG per tablet 1-2 tablet, 1-2 tablet, Oral, Q4H PRN, Athena Masse, MD   linezolid (ZYVOX) IVPB 600 mg, 600 mg, Intravenous, Q12H, Athena Masse, MD, Last Rate: 300 mL/hr at 02/03/21 2034, 600 mg at 02/03/21 2034   LORazepam (ATIVAN) tablet 0.5 mg, 0.5 mg, Oral, BID, Judd Gaudier V, MD, 0.5 mg at 02/03/21 2207   magic mouthwash, 15 mL, Oral, TID PRN, Ralene Muskrat B, MD   MEDLINE mouth rinse, 15 mL, Mouth Rinse, q12n4p, Athena Masse, MD, 15 mL at 02/03/21 1616   metroNIDAZOLE (FLAGYL) IVPB 500 mg, 500 mg, Intravenous, Q8H, Athena Masse, MD, Last Rate: 100 mL/hr at 02/04/21 0828, 500 mg at 02/04/21 5726   multivitamin with minerals tablet 1 tablet, 1 tablet, Oral, Daily, Sreenath, Sudheer B, MD   nystatin (MYCOSTATIN) 100000 UNIT/ML suspension 500,000 Units, 5 mL, Oral, QID, Priscella Mann, Sudheer B, MD, 500,000 Units at 02/04/21 1356   ondansetron  (ZOFRAN) tablet 4 mg, 4 mg, Oral, Q6H PRN **OR** ondansetron (ZOFRAN) injection 4 mg, 4 mg, Intravenous, Q6H PRN, Athena Masse, MD  BP 115/62 (BP Location: Right Arm)   Pulse (!) 103   Temp 98.1 F (36.7 C)   Resp 18   Ht 5\' 7"  (1.702 m)   Wt 146 lb (66.2 kg)   SpO2 99%   BMI 22.87 kg/m      CMP Latest Ref Rng & Units 02/04/2021  Glucose 70 - 99 mg/dL 96  BUN 8 - 23 mg/dL 6(L)  Creatinine 0.44 - 1.00 mg/dL 0.81  Sodium 135 - 145 mmol/L 129(L)  Potassium 3.5 - 5.1 mmol/L 4.0  Chloride 98 - 111 mmol/L 96(L)  CO2 22 - 32 mmol/L 25  Calcium 8.9 - 10.3 mg/dL 8.1(L)  Total Protein 6.5 - 8.1 g/dL -  Total Bilirubin 0.3 - 1.2 mg/dL -  Alkaline Phos 38 - 126 U/L -  AST 15 - 41 U/L -  ALT 0 - 44 U/L -   CBC Latest Ref Rng & Units 02/04/2021  WBC 4.0 - 10.5 K/uL 21.2(H)  Hemoglobin 12.0 - 15.0 g/dL 7.7(L)  Hematocrit 36.0 - 46.0 % 20.5(L)  Platelets 150 - 400 K/uL 43(L)    DG Chest 2 View  Result Date: 01/28/2021 CLINICAL DATA:  Weakness. EXAM: CHEST - 2 VIEW COMPARISON:  None. FINDINGS: A right-sided venous Port-A-Cath is seen with its distal tip noted at the junction of the superior vena cava and right atrium. The lungs are hyperinflated. A 1.3 cm x 1.5 cm opacity is seen overlying the left lung base on the frontal view. Mild left basilar linear scarring and/or atelectasis is noted. The heart size and mediastinal contours are within normal limits. The visualized skeletal structures are unremarkable. IMPRESSION: 1. Mild left basilar linear scarring and/or atelectasis. 2. Focal opacity overlying the left lung base which may represent an overlying nipple shadow versus residual left basilar lung mass. Electronically Signed   By: Virgina Norfolk M.D.   On: 01/28/2021 21:37   CT Head W or Wo Contrast  Result Date: 02/03/2021 CLINICAL DATA:  Encephalopathy. EXAM: CT HEAD WITHOUT AND WITH CONTRAST TECHNIQUE: Contiguous axial images were obtained from the base of the skull through the  vertex without and with intravenous contrast CONTRAST:  28mL OMNIPAQUE  IOHEXOL 300 MG/ML  SOLN COMPARISON:  06/05/2013 FINDINGS: Brain: There is no mass, hemorrhage or extra-axial collection. The size and configuration of the ventricles and extra-axial CSF spaces are normal. The brain parenchyma is normal, without acute or chronic infarction. No abnormal contrast enhancement. Vascular: No abnormal hyperdensity of the major intracranial arteries or dural venous sinuses. No intracranial atherosclerosis. Skull: The visualized skull base, calvarium and extracranial soft tissues are normal. Sinuses/Orbits: No fluid levels or advanced mucosal thickening of the visualized paranasal sinuses. No mastoid or middle ear effusion. The orbits are normal. IMPRESSION: Normal head CT. Electronically Signed   By: Ulyses Jarred M.D.   On: 02/03/2021 01:05   US Venous Img Lower Bilateral (DVT)  Result Date: 02/03/2021 CLINICAL DATA:  75 year old female with a history bilateral lower extremity pain and swelling EXAM: BILATERAL LOWER EXTREMITY VENOUS DOPPLER ULTRASOUND TECHNIQUE: Gray-scale sonography with graded compression, as well as color Doppler and duplex ultrasound were performed to evaluate the lower extremity deep venous systems from the level of the common femoral vein and including the common femoral, femoral, profunda femoral, popliteal and calf veins including the posterior tibial, peroneal and gastrocnemius veins when visible. The superficial great saphenous vein was also interrogated. Spectral Doppler was utilized to evaluate flow at rest and with distal augmentation maneuvers in the common femoral, femoral and popliteal veins. COMPARISON:  None. FINDINGS: RIGHT LOWER EXTREMITY Common Femoral Vein: No evidence of thrombus. Normal compressibility, respiratory phasicity and response to augmentation. Saphenofemoral Junction: No evidence of thrombus. Normal compressibility and flow on color Doppler imaging. Profunda  Femoral Vein: No evidence of thrombus. Normal compressibility and flow on color Doppler imaging. Femoral Vein: No evidence of thrombus. Normal compressibility, respiratory phasicity and response to augmentation. Popliteal Vein: No evidence of thrombus. Normal compressibility, respiratory phasicity and response to augmentation. Calf Veins: No evidence of thrombus. Normal compressibility and flow on color Doppler imaging. Superficial Great Saphenous Vein: No evidence of thrombus. Normal compressibility and flow on color Doppler imaging. Other Findings:  None. LEFT LOWER EXTREMITY Common Femoral Vein: No evidence of thrombus. Normal compressibility, respiratory phasicity and response to augmentation. Saphenofemoral Junction: No evidence of thrombus. Normal compressibility and flow on color Doppler imaging. Profunda Femoral Vein: No evidence of thrombus. Normal compressibility and flow on color Doppler imaging. Femoral Vein: No evidence of thrombus. Normal compressibility, respiratory phasicity and response to augmentation. Popliteal Vein: No evidence of thrombus. Normal compressibility, respiratory phasicity and response to augmentation. Calf Veins: No evidence of thrombus. Normal compressibility and flow on color Doppler imaging. Superficial Great Saphenous Vein: No evidence of thrombus. Normal compressibility and flow on color Doppler imaging. Other Findings:  None. IMPRESSION: Sonographic survey of the bilateral lower extremities negative for DVT Electronically Signed   By: Corrie Mckusick D.O.   On: 02/03/2021 11:38   DG Chest Port 1 View  Result Date: 02/02/2021 CLINICAL DATA:  Questionable sepsis EXAM: PORTABLE CHEST 1 VIEW COMPARISON:  01/28/2021 FINDINGS: Right Port-A-Cath remains in place, unchanged. There is hyperinflation of the lungs compatible with COPD. Heart is normal size. Aortic calcifications. No confluent opacities or effusions. No acute bony abnormality. IMPRESSION: COPD.  No active disease.  Electronically Signed   By: Rolm Baptise M.D.   On: 02/02/2021 23:33     Assessment and plan- Patient is a 75 y.o. 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 GCSF on 01/22/21. White count  elevated uptrending 21.2, ANC 11.9. Likely related to GCSF administration on 01/22/21. She is now s/p 1 unit of pRBCs with hemoglobin improved from 6.2 to 7.7. Goal hemoglobin > 7. Platelets - s/p 1 unit of platelets; improved from 12 to 43. Goal platelet count > 15,000. Will plan to monitor her counts closely outpatient upon discharge. All blood products will need to be irradiated.  Possible sepsis- source unclear. Work up has been negative thus far. She remains hemodynamically stable. Empirically treated for sepsis. Continue oral antibiotics upon discharge.  Thrush- continue nystatin and magic mouthwash upon discharge. Extensive stage small cell lung cancer with bone and brain metastasis-  s/p whole brain radiation and 2 cycles of carbo-etoposide chemotherapy.  Hyponatremia- improving. Suspect hypovolemia. Would benefit from IV fluids at [facility] upon discharge. Will plan to schedule her here once discharged.    I discussed the assessment and treatment plan with the patient. The patient was provided an opportunity to ask questions and all were answered. The patient agreed with the plan and demonstrated an understanding of the instructions.   The patient was advised to call back or seek an in-person evaluation if the symptoms worsen or if the condition fails to improve as anticipated.   I spent 50 minutes face-to-face visit time dedicated to the care of this patient on the date of this encounter to including pre-visit review of medical oncology, labs, face-to-face time with the patient, and post visit ordering of testing/documentation.    Beckey Rutter, DNP, AGNP-C [facility]   Delaney Meigs, DNP [facility] at [facility] 02/04/2021 6:30 PM

## 2021-02-04 NOTE — Progress Notes (Signed)
OT Cancellation Note  Patient Details Name: Adore Kithcart MRN: [id number] DOB: 17-Dec-1945   Cancelled Treatment:    Reason Eval/Treat Not Completed: Medical issues which prohibited therapy (Pt. with low Hgb 6.2. OT services are contraindicated at this time. Will continue to monitor, and intervene once pt. is medically appropriate.)  Harrel Carina, MS, OTR/L 02/04/2021, 9:53 AM

## 2021-02-04 NOTE — Evaluation (Signed)
Physical Therapy Evaluation Patient Details Name: Beth Mcmillan MRN: [id number] DOB: [birth year] Today's Date: 02/04/2021   History of Present Illness  75 year old female with extensive small cell lung CA with bone metastasis and brain metastasis.  Status post whole brain radiation and second round of combination chemotherapy.  Presented to the hospital with symptoms of fatigue, intermittent epistaxis and fevers.  Clinical Impression  Patient is received in bed, agrees to PT assessment. Husband present in room. Patient is independent with bed mobility, transfers with supervision and ambulated 250 feet with RW and supervision. Patient denies sob, or other difficulties with mobility. Feels like she is at baseline. Patient does not require PT follow up at this time. Signing off.       Follow Up Recommendations No PT follow up    Equipment Recommendations  None recommended by PT    Recommendations for Other Services       Precautions / Restrictions Precautions Precaution Comments: mod fall Restrictions Weight Bearing Restrictions: No      Mobility  Bed Mobility Overal bed mobility: Independent                  Transfers Overall transfer level: Modified independent Equipment used: Rolling walker (2 wheeled)                Ambulation/Gait Ambulation/Gait assistance: Supervision Gait Distance (Feet): 250 Feet Assistive device: Rolling walker (2 wheeled) Gait Pattern/deviations: Step-through pattern Gait velocity: WNL   General Gait Details: Patient ambulating with normal cadence and good balance using RW for support.  Stairs            Wheelchair Mobility    Modified Rankin (Stroke Patients Only)       Balance Overall balance assessment: Modified Independent                                           Pertinent Vitals/Pain Pain Assessment: No/denies pain    Home Living Family/patient expects to be discharged to:: Private  residence Living Arrangements: Spouse/significant other Available Help at Discharge: Family;Available 24 hours/day Type of Home: House Home Access: Stairs to enter Entrance Stairs-Rails: None Entrance Stairs-Number of Steps: 2 Home Layout: One level Home Equipment: Environmental consultant - 2 wheels      Prior Function Level of Independence: Independent with assistive device(s)               Hand Dominance        Extremity/Trunk Assessment   Upper Extremity Assessment Upper Extremity Assessment: Overall WFL for tasks assessed    Lower Extremity Assessment Lower Extremity Assessment: Overall WFL for tasks assessed    Cervical / Trunk Assessment Cervical / Trunk Assessment: Normal  Communication   Communication: No difficulties  Cognition Arousal/Alertness: Awake/alert Behavior During Therapy: WFL for tasks assessed/performed Overall Cognitive Status: Within Functional Limits for tasks assessed                                        General Comments      Exercises     Assessment/Plan    PT Assessment Patent does not need any further PT services  PT Problem List         PT Treatment Interventions      PT Goals (Current goals can be found in  the Care Plan section)  Acute Rehab PT Goals Patient Stated Goal: to return home tomorrow PT Goal Formulation: With patient Time For Goal Achievement: 02/05/21 Potential to Achieve Goals: Good    Frequency     Barriers to discharge        Co-evaluation               AM-PAC PT "6 Clicks" Mobility  Outcome Measure Help needed turning from your back to your side while in a flat bed without using bedrails?: None Help needed moving from lying on your back to sitting on the side of a flat bed without using bedrails?: None Help needed moving to and from a bed to a chair (including a wheelchair)?: None Help needed standing up from a chair using your arms (e.g., wheelchair or bedside chair)?: None Help  needed to walk in hospital room?: None Help needed climbing 3-5 steps with a railing? : None 6 Click Score: 24    End of Session Equipment Utilized During Treatment: Gait belt Activity Tolerance: Patient tolerated treatment well Patient left: in bed;with call bell/phone within reach;with family/visitor present Nurse Communication: Mobility status      Time: 4492-0100 PT Time Calculation (min) (ACUTE ONLY): 8 min   Charges:   PT Evaluation $PT Eval Low Complexity: 1 Low          Chevella Pearce, PT, GCS 02/04/21,3:41 PM

## 2021-02-04 NOTE — Progress Notes (Signed)
Initial Nutrition Assessment  DOCUMENTATION CODES:  Not applicable  INTERVENTION:  Add 30 ml ProSource Plus po BID, each supplement provides 100 kcal and 15 grams of protein.    Add MVI with minerals daily.  NUTRITION DIAGNOSIS:  Increased nutrient needs related to cancer and cancer related treatments as evidenced by estimated needs.  GOAL:  Patient will meet greater than or equal to 90% of their needs  MONITOR:  PO intake, Supplement acceptance, Labs, Weight trends, I & O's  REASON FOR ASSESSMENT:  Malnutrition Screening Tool    ASSESSMENT:  75 yo female with a PMH of small cell lung cancer with metastasis to the brain and bone, currently undergoing chemo and radiation, COPD, HTN, hypothyroidism, and generalized anxiety disorder who presents with sepsis of unknown source and AMS.  Spoke with pt at bedside. Pt in pleasant spirits. She reports that she has been eating very well despite chemo and radiation. She reports that her neighbors make sure she is well-fed and deliver [place] food nightly[place] She denies any appetite changes, but she is having some metallic tastes with foods that change daily.  Pt denies any weight changes. Per Epic, pt has lost ~7 lbs (4.7%) of her body weight between 01/01/21 and 01/28/21. She has since gained that weight back per Epic and now weighs 66.2 kg.  On exam, pt only had a few mild depletions.  Given above information, pt not malnourished at this time but is at risk given metastatic cancer.  Pt hesitant to try supplements given taste changes, but she is agreeable to ProSource Plus BID given that it is a small amount and she can drink something afterward if it tastes bad. Also recommend adding MVI with minerals.  Attached "Taste and Smell Changes" handout from the Academy of [employer] and Dietetics to discharge summary for home use.  Medications: reviewed; Ativan BID, nystatin QID, cefepime  TID via IV, Zyvox via IV BID, Flagyl TID via IV  Labs: reviewed; Na 129, serum Ca 8.1  NUTRITION - FOCUSED PHYSICAL EXAM: Flowsheet Row Most Recent Value  Orbital Region No depletion  Upper Arm Region No depletion  Thoracic and Lumbar Region No depletion  Buccal Region Mild depletion  Temple Region Mild depletion  Clavicle Bone Region No depletion  Clavicle and Acromion Bone Region No depletion  Scapular Bone Region No depletion  Dorsal Hand Mild depletion  Patellar Region No depletion  Anterior Thigh Region No depletion  Posterior Calf Region No depletion  Edema (RD Assessment) None  Hair Reviewed  [Hair loss from chemo]  Eyes Reviewed  Mouth Reviewed  Skin Reviewed  Nails Reviewed   Diet Order:   Diet Order             Diet Heart Room service appropriate? Yes; Fluid consistency: Thin  Diet effective now                  EDUCATION NEEDS:  Education needs have been addressed  Skin:  Skin Assessment: Reviewed RN Assessment  Last BM:  PTA/unknown  Height:  Ht Readings from Last 1 Encounters:  02/02/21 5\' 7"  (1.702 m)   Weight:  Wt Readings from Last 1 Encounters:  02/03/21 66.2 kg   Ideal Body Weight:  61.4 kg  BMI:  Body mass index is 22.87 kg/m.  Estimated Nutritional Needs:  Kcal:  2100-2300 Protein:  85-100 grams Fluid:  >2 L  Derrel Nip, RD, LDN Registered Dietitian  I After-Hours/Weekend Pager # in [place]

## 2021-02-04 NOTE — Discharge Instructions (Signed)
Taste and Smell Changes Cancer and cancer treatments can cause changes in your senses of taste and smell. How foods taste and smell can change from day to day, and these changes might affect your appetite. Experiment with the tips below to find  what works for you.  When Foods Have Little Flavor or an "Off" Taste " Blend fresh or frozen fruits into shakes, ice cream, or yogurt. " Eat frozen fruits (such as whole grapes, blueberries, or mandarin orange pieces). " Select fresh vegetables over canned or frozen as they may be more appealing. " Choose foods with tart flavors like lemon wedges, citrus fruits, or lemonade. Avoid these acidic foods if you have a sore mouth or throat. " Use marinades for meats to change the flavor. Add herbs, spices, lemon, vinegar, pickles, or strongly flavored sauces and condiments to season foods.  For Bitter, Acidic, or Metallic Tastes " Eat sweet fruits like cantaloupe or watermelon with meals. " Drink sweet or sour beverages like lemonade, apple juice, cranberry juice, or sweet tea. Add lemon juice or flavorings to water. " Use strongly flavored spices or seasonings like onion, garlic, chili powder, basil, oregano, rosemary, tarragon, mustard, ketchup, or mint. " Use plastic or bamboo utensils to reduce the sense of a metal taste in the mouth. " Choose alternative protein sources like chicken, eggs, tofu, dairy foods, nuts, or beans. " Try adding a sweetener or a squeeze of lemon to foods. " Use sugar-free lemon drops, gum, or mints to improve the taste in your mouth.  For Salty Taste " Choose foods that are naturally sweet like fruits, tomatoes, carrots, sweet potatoes, and yogurt. " Use low-sodium products and don't use salt in recipes. Avoid dining out as these foods tend to be saltier.  For Enhanced Sweet Taste " Choose bland or sour flavors. " Dilute juices or serve them over ice. " Choose vegetables rather than fruits. " Try adding squeezed lemon to  foods to overcome the sweet taste.  When Smells Are Bothersome " Choose foods that do not need to be cooked like smoothies, sandwiches, cottage cheese, yogurt, puddings, nut butters, and fruit. " Avoid foods with strong smells, like fish, onions, garlic, and cabbage. " Serve foods cold or at room temperature. " Avoid the kitchen during meal preparation and avoid cooking methods that take a long time, like meals prepared in a slow cooker. " Eat in cool, well-ventilated rooms that don't have any strong smells. " If beverages have unpleasant smells, place a lid on an open cup and drink through a straw.  QUICK TIPS Good oral care is important. Keep your mouth clean and healthy. Rinse and brush your teeth after meals and before bed. " Before eating, rinse your mouth with a solution of 1 quart of water mixed with  teaspoon of salt and 1 teaspoon of baking soda  Copyright  2021 Academy of Nutrition and Dietetics. This handout may be reproduced for education purposes.

## 2021-02-04 NOTE — Progress Notes (Signed)
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  Status post whole brain radiation and second round of combination chemotherapy.  Presented to the hospital with symptoms of fatigue, intermittent epistaxis and fevers.  Patient reports improvement since admission to the hospital and initiation of IV antibiotics and fluids.   Will continue empiric treatment for suspected sepsis.  After discussion with oncology patient will receive 1 unit PRBC and 1 unit platelets.  Will need irradiated blood products.  Patient responded to blood and platelet transfusion.  Hemoglobin responded to 7.7.  Platelets increased to 43.  No fevers over interval.  All cultures remain no growth to date.   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)   GAD (generalized anxiety disorder)   Anemia associated with chemotherapy   Thrombocytopenia (HCC)   Hyponatremia   Acute metabolic encephalopathy   Generalized weakness   Antineoplastic chemotherapy induced pancytopenia (HCC)  Sepsis of unknown etiology Generalized weakness Acute metabolic encephalopathy Patient presented with fever, confusion, weakness, leukocytosis, elevated lactic acid Encephalopathy has improved Head CT with no findings Unable to exclude sepsis at this point No clear source of infection All cultures no growth to date Plan: Continue broad-spectrum antibiotic coverage for now Continue supplemental IV fluids Patient remains fever free with no signs of infection over the next 24 hours we will plan on discharge home 6/16  Metastatic small cell lung cancer Bicytopenia associated with chemotherapy Platelet count 12, hemoglobin 6.6 on admission Related  underlying chemotherapy Oncology consulted Status post 1 unit PRBC, 1 unit platelets on 6/14 Hemoglobin and platelet count responded appropriately Plan: No further transfusion at this point Check a.m. labs If hemoglobin greater than 7 and platelets greater than 15 we will plan on discharge home  Hyponatremia Appears chronic and asymptomatic Suspect SIADH versus hypovolemia Improving with IV hydration  Generalized anxiety disorder PTA lorazepam  Hypothyroidism PTA Synthroid  Essential hypertension PTA losartan and metoprolol     DVT prophylaxis: SCDs Code Status: Full Family Communication: None today.  Daughter at bedside 6/14 Disposition Plan: Status is: Inpatient  Remains inpatient appropriate because:Inpatient level of care appropriate due to severity of illness  Dispo: The patient is from: Home              Anticipated d/c is to: Home              Patient currently is not medically stable to d/c.   Difficult to place patient No  If blood counts remain appropriate and patient remains fever free and all cultures no growth to date anticipate discharge home on 6/16     Level of care: Progressive Cardiac  Consultants:  Oncology  Procedures:  None  Antimicrobials:  Linezolid Cefepime Flagyl   Subjective: Patient seen and examined.  No complaints this morning.  Feels well.  Eager to go home.  Objective: Vitals:   02/04/21 0333 02/04/21 0725 02/04/21 0730 02/04/21 1126  BP: 133/60 (!) 119/53  115/62  Pulse: (!) 103 (!) 115 (!) 105 (!) 103  Resp: 16 18  18   Temp: 98 F (36.7 C) 98.9 F (37.2 C)  98.1 F (36.7 C)  TempSrc:  Oral    SpO2: 96% 96%  99%  Weight:      Height:        Intake/Output Summary (Last 24 hours) at 02/04/2021 1427 Last data filed at 02/04/2021 1124 Gross per 24 hour  Intake 1324.63 ml  Output 800 ml  Net 524.63 ml   Filed Weights   02/02/21 2312 02/03/21 1552  Weight: 63.5 kg 66.2 kg    Examination:  General exam:  No acute distress Respiratory system: Lungs clear.  Normal work of breathing.  Room air Cardiovascular system: S1 & S2 heard, RRR. No JVD, murmurs, rubs, gallops or clicks. No pedal edema. Gastrointestinal system: Abdomen is nondistended, soft and nontender. No organomegaly or masses felt. Normal bowel sounds heard. Central nervous system: Alert and oriented. No focal neurological deficits. Extremities: Symmetric 5 x 5 power. Skin: No rashes, lesions or ulcers Psychiatry: Judgement and insight appear normal. Mood & affect appropriate.     Data Reviewed: I have personally reviewed following labs and imaging studies  CBC: Recent Labs  Lab 01/28/21 2005 02/02/21 2323 02/03/21 0420 02/04/21 0814  WBC 0.5* 13.9* 16.0* 21.2*  NEUTROABS  --  8.1*  --  11.9*  HGB 8.0* 6.6* 6.2* 7.7*  HCT 21.8* 18.6* 17.4* 20.5*  MCV 90.1 92.5 92.6 88.7  PLT 38* 12* 13* 43*   Basic Metabolic Panel: Recent Labs  Lab 01/28/21 2005 02/02/21 2323 02/03/21 0450 02/04/21 0814  NA 124* 124* 127* 129*  K 4.1 4.7 4.4 4.0  CL 91* 92* 97* 96*  CO2 26 24 25 25   GLUCOSE 155* 96 85 96  BUN 14 <5* <5* 6*  CREATININE 0.74 0.78 0.78 0.81  CALCIUM 9.0 8.8* 8.4* 8.1*  MG 1.5*  --   --   --    GFR: Estimated Creatinine Clearance: 60.2 mL/min (by C-G formula based on SCr of 0.81 mg/dL). Liver Function Tests: Recent Labs  Lab 02/02/21 2323  AST 18  ALT 17  ALKPHOS 77  BILITOT 0.8  PROT 6.0*  ALBUMIN 3.2*   No results for input(s): LIPASE, AMYLASE in the last 168 hours. No results for input(s): AMMONIA in the last 168 hours. Coagulation Profile: Recent Labs  Lab 02/02/21 2323  INR 1.1   Cardiac Enzymes: No results for input(s): CKTOTAL, CKMB, CKMBINDEX, TROPONINI in the last 168 hours. BNP (last 3 results) No results for input(s): PROBNP in the last 8760 hours. HbA1C: No results for input(s): HGBA1C in the last 72 hours. CBG: No results for input(s): GLUCAP in the last 168 hours. Lipid  Profile: No results for input(s): CHOL, HDL, LDLCALC, TRIG, CHOLHDL, LDLDIRECT in the last 72 hours. Thyroid Function Tests: No results for input(s): TSH, T4TOTAL, FREET4, T3FREE, THYROIDAB in the last 72 hours. Anemia Panel: No results for input(s): VITAMINB12, FOLATE, FERRITIN, TIBC, IRON, RETICCTPCT in the last 72 hours. Sepsis Labs: Recent Labs  Lab 02/02/21 2323 02/03/21 0127 02/03/21 0450  PROCALCITON  --   --  0.39  LATICACIDVEN 2.3* 1.9  --     Recent Results (from the past 240 hour(s))  Resp Panel by RT-PCR (Flu A&B, Covid) Nasopharyngeal Swab     Status: None   Collection Time: 02/02/21 11:17 PM   Specimen: Nasopharyngeal Swab; Nasopharyngeal(NP) swabs in vial transport medium  Result Value Ref Range Status   SARS Coronavirus 2 by RT PCR NEGATIVE NEGATIVE Final    Comment: (NOTE) SARS-CoV-2 target nucleic acids are NOT DETECTED.  The SARS-CoV-2 RNA is generally detectable in upper respiratory specimens during the acute phase of infection. The lowest concentration of  SARS-CoV-2 viral copies this assay can detect is 138 copies/mL. A negative result does not preclude SARS-Cov-2 infection and should not be used as the sole basis for treatment or other patient management decisions. A negative result may occur with  improper specimen collection/handling, submission of specimen other than nasopharyngeal swab, presence of viral mutation(s) within the areas targeted by this assay, and inadequate number of viral copies(<138 copies/mL). A negative result must be combined with clinical observations, patient history, and epidemiological information. The expected result is Negative.  Fact Sheet for Patients:  EntrepreneurPulse.com.au  Fact Sheet for Healthcare Providers:  IncredibleEmployment.be  This test is no t yet approved or cleared by the [place] FDA and  has been authorized for detection and/or diagnosis of SARS-CoV-2 by FDA  under an Emergency Use Authorization (EUA). This EUA will remain  in effect (meaning this test can be used) for the duration of the COVID-19 declaration under Section 564(b)(1) of the Act, 21 U.S.C.section 360bbb-3(b)(1), unless the authorization is terminated  or revoked sooner.       Influenza A by PCR NEGATIVE NEGATIVE Final   Influenza B by PCR NEGATIVE NEGATIVE Final    Comment: (NOTE) The Xpert Xpress SARS-CoV-2/FLU/RSV plus assay is intended as an aid in the diagnosis of influenza from Nasopharyngeal swab specimens and should not be used as a sole basis for treatment. Nasal washings and aspirates are unacceptable for Xpert Xpress SARS-CoV-2/FLU/RSV testing.  Fact Sheet for Patients: EntrepreneurPulse.com.au  Fact Sheet for Healthcare Providers: IncredibleEmployment.be  This test is not yet approved or cleared by the [place] FDA and has been authorized for detection and/or diagnosis of SARS-CoV-2 by FDA under an Emergency Use Authorization (EUA). This EUA will remain in effect (meaning this test can be used) for the duration of the COVID-19 declaration under Section 564(b)(1) of the Act, 21 U.S.C. section 360bbb-3(b)(1), unless the authorization is terminated or revoked.  Performed at [facility], [address] [place], [place] [address]   Blood culture (routine single)     Status: None (Preliminary result)   Collection Time: 02/02/21 11:23 PM   Specimen: BLOOD  Result Value Ref Range Status   Specimen Description BLOOD RIGHT ANTECUBITAL  Final   Special Requests   Final    BOTTLES DRAWN AEROBIC AND ANAEROBIC Blood Culture adequate volume   Culture   Final    NO GROWTH 2 DAYS Performed at [facility], [address], [place], [place] [address]    Report Status PENDING  Incomplete  Urine culture     Status: None   Collection Time: 02/03/21 12:15 AM   Specimen: Urine, Random  Result Value Ref Range  Status   Specimen Description   Final    URINE, RANDOM Performed at [facility], [address], [place], [place] [address]    Special Requests   Final    NONE Performed at [facility], [address] [place], [place] [address]    Culture   Final    NO GROWTH Performed at [facility], [place] [address], [place], [place] [address]    Report Status 02/04/2021 FINAL  Final  Blood culture (single)     Status: None (Preliminary result)   Collection Time: 02/03/21 12:15 AM   Specimen: BLOOD  Result Value Ref Range Status   Specimen Description BLOOD RIGHT ASSIST CONTROL  Final   Special Requests   Final    BOTTLES DRAWN AEROBIC AND ANAEROBIC Blood Culture results may not  be optimal due to an excessive volume of blood received in culture bottles   Culture   Final    NO GROWTH 1 DAY Performed at [facility], [address], [place], [place] [address]    Report Status PENDING  Incomplete         Radiology Studies: CT Head W or Wo Contrast  Result Date: 02/03/2021 CLINICAL DATA:  Encephalopathy. EXAM: CT HEAD WITHOUT AND WITH CONTRAST TECHNIQUE: Contiguous axial images were obtained from the base of the skull through the vertex without and with intravenous contrast CONTRAST:  43mL OMNIPAQUE IOHEXOL 300 MG/ML  SOLN COMPARISON:  06/05/2013 FINDINGS: Brain: There is no mass, hemorrhage or extra-axial collection. The size and configuration of the ventricles and extra-axial CSF spaces are normal. The brain parenchyma is normal, without acute or chronic infarction. No abnormal contrast enhancement. Vascular: No abnormal hyperdensity of the major intracranial arteries or dural venous sinuses. No intracranial atherosclerosis. Skull: The visualized skull base, calvarium and extracranial soft tissues are normal. Sinuses/Orbits: No fluid levels or advanced mucosal thickening of the visualized paranasal sinuses. No mastoid or middle ear effusion. The orbits are  normal. IMPRESSION: Normal head CT. Electronically Signed   By: Ulyses Jarred M.D.   On: 02/03/2021 01:05   US Venous Img Lower Bilateral (DVT)  Result Date: 02/03/2021 CLINICAL DATA:  75 year old female with a history bilateral lower extremity pain and swelling EXAM: BILATERAL LOWER EXTREMITY VENOUS DOPPLER ULTRASOUND TECHNIQUE: Gray-scale sonography with graded compression, as well as color Doppler and duplex ultrasound were performed to evaluate the lower extremity deep venous systems from the level of the common femoral vein and including the common femoral, femoral, profunda femoral, popliteal and calf veins including the posterior tibial, peroneal and gastrocnemius veins when visible. The superficial great saphenous vein was also interrogated. Spectral Doppler was utilized to evaluate flow at rest and with distal augmentation maneuvers in the common femoral, femoral and popliteal veins. COMPARISON:  None. FINDINGS: RIGHT LOWER EXTREMITY Common Femoral Vein: No evidence of thrombus. Normal compressibility, respiratory phasicity and response to augmentation. Saphenofemoral Junction: No evidence of thrombus. Normal compressibility and flow on color Doppler imaging. Profunda Femoral Vein: No evidence of thrombus. Normal compressibility and flow on color Doppler imaging. Femoral Vein: No evidence of thrombus. Normal compressibility, respiratory phasicity and response to augmentation. Popliteal Vein: No evidence of thrombus. Normal compressibility, respiratory phasicity and response to augmentation. Calf Veins: No evidence of thrombus. Normal compressibility and flow on color Doppler imaging. Superficial Great Saphenous Vein: No evidence of thrombus. Normal compressibility and flow on color Doppler imaging. Other Findings:  None. LEFT LOWER EXTREMITY Common Femoral Vein: No evidence of thrombus. Normal compressibility, respiratory phasicity and response to augmentation. Saphenofemoral Junction: No evidence of  thrombus. Normal compressibility and flow on color Doppler imaging. Profunda Femoral Vein: No evidence of thrombus. Normal compressibility and flow on color Doppler imaging. Femoral Vein: No evidence of thrombus. Normal compressibility, respiratory phasicity and response to augmentation. Popliteal Vein: No evidence of thrombus. Normal compressibility, respiratory phasicity and response to augmentation. Calf Veins: No evidence of thrombus. Normal compressibility and flow on color Doppler imaging. Superficial Great Saphenous Vein: No evidence of thrombus. Normal compressibility and flow on color Doppler imaging. Other Findings:  None. IMPRESSION: Sonographic survey of the bilateral lower extremities negative for DVT Electronically Signed   By: Corrie Mckusick D.O.   On: 02/03/2021 11:38   DG Chest Port 1 View  Result Date: 02/02/2021 CLINICAL DATA:  Questionable sepsis EXAM: PORTABLE CHEST  1 VIEW COMPARISON:  01/28/2021 FINDINGS: Right Port-A-Cath remains in place, unchanged. There is hyperinflation of the lungs compatible with COPD. Heart is normal size. Aortic calcifications. No confluent opacities or effusions. No acute bony abnormality. IMPRESSION: COPD.  No active disease. Electronically Signed   By: Rolm Baptise M.D.   On: 02/02/2021 23:33        Scheduled Meds:  (feeding supplement) PROSource Plus  30 mL Oral BID BM   chlorhexidine  15 mL Mouth Rinse BID   LORazepam  0.5 mg Oral BID   mouth rinse  15 mL Mouth Rinse q12n4p   multivitamin with minerals  1 tablet Oral Daily   nystatin  5 mL Oral QID   Continuous Infusions:  ceFEPime (MAXIPIME) IV 2 g (02/04/21 1401)   linezolid (ZYVOX) IV 600 mg (02/03/21 2034)   metronidazole 500 mg (02/04/21 0828)     LOS: 1 day    Time spent: 25 minutes    Sidney Ace, MD [facility] Pager [phone]  If 7PM-7AM, please contact night-coverage 02/04/2021, 2:27 PM

## 2021-02-04 NOTE — TOC Initial Note (Signed)
Transition of Care [facility]) - Initial/Assessment Note    Patient Details  Name: Beth Mcmillan MRN: [id number] Date of Birth: [birth year]  Transition of Care [facility]) CM/SW Contact:    Eileen Stanford, LCSW Phone Number: 02/04/2021, 2:02 PM  Clinical Narrative:  Pt states her spouse and daughter takes her to her doctor appointments. Pt states she still sees Dr Doy Hutching for her primary care. Pt states she uses [facility] in [place] for her prescriptions. Pt states she is not currently getting any in home services and states she does not need any at d/c.                  Expected Discharge Plan: Home/Self Care Barriers to Discharge: Continued Medical Work up   Patient Goals and CMS Choice Patient states their goals for this hospitalization and ongoing recovery are:: to go home   Choice offered to / list presented to : NA  Expected Discharge Plan and Services Expected Discharge Plan: Home/Self Care In-house Referral: NA   Post Acute Care Choice: NA Living arrangements for the past 2 months: Single Family Home                           HH Arranged: NA          Prior Living Arrangements/Services Living arrangements for the past 2 months: Single Family Home Lives with:: Spouse Patient language and need for interpreter reviewed:: Yes Do you feel safe going back to the place where you live?: Yes      Need for Family Participation in Patient Care: Yes (Comment) Care giver support system in place?: Yes (comment)   Criminal Activity/Legal Involvement Pertinent to Current Situation/Hospitalization: No - Comment as needed  Activities of Daily Living Home Assistive Devices/Equipment: Walker (specify type), Dentures (specify type) ADL Screening (condition at time of admission) Patient's cognitive ability adequate to safely complete daily activities?: Yes Is the patient deaf or have difficulty hearing?: No Does the patient have difficulty seeing, even when wearing glasses/contacts?:  No Does the patient have difficulty concentrating, remembering, or making decisions?: Yes Patient able to express need for assistance with ADLs?: Yes Does the patient have difficulty dressing or bathing?: No Independently performs ADLs?: Yes (appropriate for developmental age) Does the patient have difficulty walking or climbing stairs?: Yes Weakness of Legs: Both Weakness of Arms/Hands: None  Permission Sought/Granted Permission sought to share information with : Family Supports Permission granted to share information with : Yes, Verbal Permission Granted  Share Information with NAME: Fritz Pickerel     Permission granted to share info w Relationship: spouse     Emotional Assessment Appearance:: Appears stated age Attitude/Demeanor/Rapport: Engaged Affect (typically observed): Appropriate Orientation: : Oriented to Self, Oriented to Place, Oriented to  Time, Oriented to Situation Alcohol / Substance Use: Not Applicable Psych Involvement: No (comment)  Admission diagnosis:  Delirium [R41.0] Thrombocytopenia ([place]) [D69.6] Leg swelling [M79.89] DVT (deep venous thrombosis) (HCC) [I82.409] Bilateral leg pain [M79.604, M79.605] Sepsis ([place]) [A41.9] Anemia, unspecified type [D64.9] 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[place]) [K74.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, Z79.899] Sepsis, due to unspecified organism, unspecified whether acute organ dysfunction present [facility]) [A41.9] Patient Active Problem List   Diagnosis Date Noted   Sepsis ([place]) 02/03/2021   Anemia associated with chemotherapy 02/03/2021   Thrombocytopenia ([place]) 02/03/2021   Hyponatremia 56/38/7564   Acute metabolic encephalopathy 33/29/5188   Generalized weakness 02/03/2021   Antineoplastic chemotherapy induced pancytopenia ([place])    Bone metastases ([place]) 01/01/2021  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 ([place]) 12/26/2020   Goals of care, counseling/discussion 12/13/2020   Small cell lung cancer in adult [facility]) 12/13/2020    Hypothyroidism, acquired 10/02/2019   GAD (generalized anxiety disorder) 10/02/2019   Personal history of colonic polyps    Polyp of transverse colon    Special screening for malignant neoplasms, colon    Benign neoplasm of cecum    Benign neoplasm of ascending colon    PCP:  Idelle Crouch, MD Pharmacy:   [facility] [place] [place] [place] [address] Phone: [phone] Fax: [phone]     Social Determinants of Health (SDOH) Interventions    Readmission Risk Interventions Readmission Risk Prevention Plan 02/04/2021  Transportation Screening Complete  PCP or Specialist Appt within 3-5 Days Complete  HRI or [place] Complete  Social Work Consult for [place] Planning/Counseling Complete  Palliative Care Screening Not Applicable  Medication Review [employer]) Complete  Some recent data might be hidden

## 2021-02-05 LAB — BASIC METABOLIC PANEL
Anion gap: 4 — ABNORMAL LOW (ref 5–15)
BUN: <5 mg/dL — ABNORMAL LOW (ref 8–23)
CO2: 25 mmol/L (ref 22–32)
Calcium: 8 mg/dL — ABNORMAL LOW (ref 8.9–10.3)
Chloride: 99 mmol/L (ref 98–111)
Creatinine, Ser: 0.78 mg/dL (ref 0.44–1.00)
GFR, Estimated: >60 mL/min (ref >60)
Glucose, Bld: 94 mg/dL (ref 70–99)
Potassium: 3.9 mmol/L (ref 3.5–5.1)
Sodium: 128 mmol/L — ABNORMAL LOW (ref 135–145)

## 2021-02-05 LAB — CBC WITH DIFFERENTIAL/PLATELET
Abs Immature Granulocytes: 5.16 10*3/uL — ABNORMAL HIGH (ref 0.00–0.07)
Basophils Absolute: 0.1 10*3/uL (ref 0.0–0.1)
Basophils Relative: 1 %
Eosinophils Absolute: 0 10*3/uL (ref 0.0–0.5)
Eosinophils Relative: 0 %
HCT: 19.5 % — ABNORMAL LOW (ref 36.0–46.0)
Hemoglobin: 7 g/dL — ABNORMAL LOW (ref 12.0–15.0)
Immature Granulocytes: 25 %
Lymphocytes Relative: 7 %
Lymphs Abs: 1.4 10*3/uL (ref 0.7–4.0)
MCH: 32.9 pg (ref 26.0–34.0)
MCHC: 35.9 g/dL (ref 30.0–36.0)
MCV: 91.5 fL (ref 80.0–100.0)
Monocytes Absolute: 3 10*3/uL — ABNORMAL HIGH (ref 0.1–1.0)
Monocytes Relative: 15 %
Neutro Abs: 11 10*3/uL — ABNORMAL HIGH (ref 1.7–7.7)
Neutrophils Relative %: 52 %
Platelets: 37 10*3/uL — ABNORMAL LOW (ref 150–400)
RBC: 2.13 MIL/uL — ABNORMAL LOW (ref 3.87–5.11)
RDW: 14.5 % (ref 11.5–15.5)
Smear Review: NORMAL
WBC: 20.7 10*3/uL — ABNORMAL HIGH (ref 4.0–10.5)
nRBC: 0.1 % (ref 0.0–0.2)

## 2021-02-05 LAB — PREPARE RBC (CROSSMATCH)

## 2021-02-05 MED ORDER — HEPARIN SOD (PORK) LOCK FLUSH 100 UNIT/ML IV SOLN
500.0000 [IU] | INTRAVENOUS | Status: AC | PRN
  Administered 2021-02-05: 500 [IU]
  Filled 2021-02-05: qty 5

## 2021-02-05 MED ORDER — MAGIC MOUTHWASH: 15.0000 mL | Freq: Three times a day (TID) | ORAL | 0 refills | Status: DC

## 2021-02-05 MED ORDER — LEVOFLOXACIN 750 MG PO TABS: 750.0000 mg | ORAL_TABLET | Freq: Every day | ORAL | 0 refills | Status: DC

## 2021-02-05 MED ORDER — SODIUM CHLORIDE 0.9% IV SOLUTION: Freq: Once | INTRAVENOUS | Status: DC

## 2021-02-05 NOTE — Progress Notes (Signed)
OT Screen Note  Patient Details Name: Beth Mcmillan MRN: [id number] DOB: 1946-04-04   Cancelled Treatment:    Reason Eval/Treat Not Completed: OT screened, no needs identified, will sign off. Chart reviewed. Spoke to [name]. Pt independent with ADL, mobility at this time. Discharging today. No skilled OT needs identified. Will sign off.   Hanley Hays, MPH, MS, OTR/L ascom [phone] 02/05/21, 9:01 AM

## 2021-02-05 NOTE — Discharge Summary (Signed)
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  Status post whole brain radiation and second round of combination chemotherapy.  Presented to the hospital with symptoms of fatigue, intermittent epistaxis and fevers.  Patient reports improvement since admission to the hospital and initiation of IV antibiotics and fluids.   Will continue empiric treatment for suspected sepsis.  After discussion with oncology patient will receive 1 unit PRBC and 1 unit platelets.  Will need irradiated blood products.   Patient responded to blood and platelet transfusion.  Hemoglobin responded to 7.7.  Platelets increased to 43.  No fevers over interval.  All cultures remain no growth to date.  On day of discharge patient is stable.  All cultures no growth to date.  No fevers over interval.  Patient feels well and is ready to go.  Complete blood count reveals hemoglobin of 7, platelet 37.  After discussion with oncology we will transfuse 1 unit PRBC prior to discharge.  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  Sepsis ruled out No clear source of infection All cultures no growth to date Transition to Levaquin to complete empiric 1 week course Discharged home in stable condition   Metastatic small cell lung cancer Bicytopenia associated with chemotherapy Platelet count 12, hemoglobin 6.6 on admission Related underlying chemotherapy Oncology consulted Status post 1 unit PRBC, 1 unit platelets on 6/14 Hemoglobin and platelet count responded appropriately Hemoglobin 7.0 on 6/16, transfuse 1 unit PRBC Can discharge home after with follow-up with oncology   Hyponatremia Appears chronic and asymptomatic Suspect SIADH versus hypovolemia Improving with IV hydration   Generalized anxiety disorder PTA lorazepam   Hypothyroidism PTA Synthroid   Essential hypertension PTA losartan and metoprolol  Discharge Instructions  Discharge Instructions     Diet - low sodium heart healthy   Complete by: As directed    Increase activity slowly   Complete by: As directed       Allergies as of 02/05/2021       Reactions   Augmentin [amoxicillin-pot Clavulanate]    "messes up my blood cells"   Codeine Nausea Only   Levaquin [levofloxacin]    Affected lab results    Tape Rash   Some bandaids cause rash sometimes        Medication List     STOP taking these medications    cefdinir 300 MG capsule Commonly known as: OMNICEF   dexamethasone 4 MG tablet Commonly known as: DECADRON   lansoprazole 30 MG capsule Commonly known as: PREVACID   ondansetron 8 MG  tablet Commonly known as: Zofran   prochlorperazine 10 MG tablet Commonly known as: COMPAZINE       TAKE these medications    albuterol 108 (90 Base) MCG/ACT inhaler Commonly known as: VENTOLIN  HFA Inhale 2 puffs into the lungs 2 (two) times daily.   atorvastatin 10 MG tablet Commonly known as: LIPITOR TAKE 1 TABLET BY MOUTH DAILY What changed: when to take this   levofloxacin 750 MG tablet Commonly known as: Levaquin Take 1 tablet (750 mg total) by mouth daily for 5 days. Start taking on: February 06, 2021   levothyroxine 50 MCG tablet Commonly known as: SYNTHROID Take 50 mcg by mouth daily before breakfast.   lidocaine-prilocaine cream Commonly known as: EMLA Apply to affected area once   loratadine 10 MG tablet Commonly known as: CLARITIN Take 10 mg by mouth daily. Reported on 01/22/2016   LORazepam 0.5 MG tablet Commonly known as: ATIVAN Take 1 tablet (0.5 mg total) by mouth every 6 (six) hours as needed for anxiety.   losartan-hydrochlorothiazide 100-25 MG tablet Commonly known as: HYZAAR Take 1 tablet by mouth daily.   magic mouthwash Soln Take 15 mLs by mouth 3 (three) times daily for 7 days.   Magnesium Chloride-Calcium 64-106 MG Tbec Take 1 tablet by mouth daily.   metoprolol succinate 50 MG 24 hr tablet Commonly known as: TOPROL-XL Take 50 mg by mouth daily.   nystatin 100000 UNIT/ML suspension Commonly known as: MYCOSTATIN Take 5 mLs (500,000 Units total) by mouth 4 (four) times daily.   PARoxetine 10 MG tablet Commonly known as: PAXIL Take 5 mg by mouth every evening.   potassium chloride SA 20 MEQ tablet Commonly known as: KLOR-CON Take 1 tablet (20 mEq total) by mouth daily.   Slow-Mag 64 MG Tbec SR tablet Generic drug: magnesium chloride Take 1 tablet (64 mg total) by mouth daily.   sodium chloride 1 g tablet Take 1 tablet (1 g total) by mouth 3 (three) times daily.        Allergies  Allergen Reactions   Augmentin [Amoxicillin-Pot Clavulanate]     "messes up my blood cells"   Codeine Nausea Only   Levaquin [Levofloxacin]     Affected lab results    Tape Rash    Some bandaids cause rash sometimes     Consultations: Oncology   Procedures/Studies: DG Chest 2 View  Result Date: 01/28/2021 CLINICAL DATA:  Weakness. EXAM: CHEST - 2 VIEW COMPARISON:  None. FINDINGS: A right-sided venous Port-A-Cath is seen with its distal tip noted at the junction of the superior vena cava and right atrium. The lungs are hyperinflated. A 1.3 cm x 1.5 cm opacity is seen overlying the left lung base on the frontal view. Mild left basilar linear scarring and/or atelectasis is noted. The heart size and mediastinal contours are within normal limits. The visualized skeletal structures are unremarkable. IMPRESSION: 1. Mild left basilar linear scarring and/or atelectasis. 2. Focal opacity overlying the left lung base which may represent an overlying nipple shadow versus residual left basilar lung mass. Electronically Signed   By: Virgina Norfolk M.D.   On: 01/28/2021 21:37   CT Head W or Wo Contrast  Result Date: 02/03/2021 CLINICAL DATA:  Encephalopathy. EXAM: CT HEAD WITHOUT AND WITH CONTRAST TECHNIQUE: Contiguous axial images were obtained from the base of the skull through the vertex without and with intravenous contrast CONTRAST:  63mL OMNIPAQUE IOHEXOL 300 MG/ML  SOLN COMPARISON:  06/05/2013 FINDINGS: Brain: There is no mass, hemorrhage or  extra-axial collection. The size and configuration of the ventricles and extra-axial CSF spaces are normal. The brain parenchyma is normal, without acute or chronic infarction. No abnormal contrast enhancement. Vascular: No abnormal hyperdensity of the major intracranial arteries or dural venous sinuses. No intracranial atherosclerosis. Skull: The visualized skull base, calvarium and extracranial soft tissues are normal. Sinuses/Orbits: No fluid levels or advanced mucosal thickening of the visualized paranasal sinuses. No mastoid or middle ear effusion. The orbits are normal. IMPRESSION: Normal head CT. Electronically Signed   By: Ulyses Jarred M.D.   On: 02/03/2021 01:05   US  Venous Img Lower Bilateral (DVT)  Result Date: 02/03/2021 CLINICAL DATA:  74 year old female with a history bilateral lower extremity pain and swelling EXAM: BILATERAL LOWER EXTREMITY VENOUS DOPPLER ULTRASOUND TECHNIQUE: Gray-scale sonography with graded compression, as well as color Doppler and duplex ultrasound were performed to evaluate the lower extremity deep venous systems from the level of the common femoral vein and including the common femoral, femoral, profunda femoral, popliteal and calf veins including the posterior tibial, peroneal and gastrocnemius veins when visible. The superficial great saphenous vein was also interrogated. Spectral Doppler was utilized to evaluate flow at rest and with distal augmentation maneuvers in the common femoral, femoral and popliteal veins. COMPARISON:  None. FINDINGS: RIGHT LOWER EXTREMITY Common Femoral Vein: No evidence of thrombus. Normal compressibility, respiratory phasicity and response to augmentation. Saphenofemoral Junction: No evidence of thrombus. Normal compressibility and flow on color Doppler imaging. Profunda Femoral Vein: No evidence of thrombus. Normal compressibility and flow on color Doppler imaging. Femoral Vein: No evidence of thrombus. Normal compressibility, respiratory phasicity and response to augmentation. Popliteal Vein: No evidence of thrombus. Normal compressibility, respiratory phasicity and response to augmentation. Calf Veins: No evidence of thrombus. Normal compressibility and flow on color Doppler imaging. Superficial Great Saphenous Vein: No evidence of thrombus. Normal compressibility and flow on color Doppler imaging. Other Findings:  None. LEFT LOWER EXTREMITY Common Femoral Vein: No evidence of thrombus. Normal compressibility, respiratory phasicity and response to augmentation. Saphenofemoral Junction: No evidence of thrombus. Normal compressibility and flow on color Doppler imaging. Profunda Femoral Vein: No evidence of thrombus.  Normal compressibility and flow on color Doppler imaging. Femoral Vein: No evidence of thrombus. Normal compressibility, respiratory phasicity and response to augmentation. Popliteal Vein: No evidence of thrombus. Normal compressibility, respiratory phasicity and response to augmentation. Calf Veins: No evidence of thrombus. Normal compressibility and flow on color Doppler imaging. Superficial Great Saphenous Vein: No evidence of thrombus. Normal compressibility and flow on color Doppler imaging. Other Findings:  None. IMPRESSION: Sonographic survey of the bilateral lower extremities negative for DVT Electronically Signed   By: Corrie Mckusick D.O.   On: 02/03/2021 11:38   DG Chest Port 1 View  Result Date: 02/02/2021 CLINICAL DATA:  Questionable sepsis EXAM: PORTABLE CHEST 1 VIEW COMPARISON:  01/28/2021 FINDINGS: Right Port-A-Cath remains in place, unchanged. There is hyperinflation of the lungs compatible with COPD. Heart is normal size. Aortic calcifications. No confluent opacities or effusions. No acute bony abnormality. IMPRESSION: COPD.  No active disease. Electronically Signed   By: Rolm Baptise M.D.   On: 02/02/2021 23:33   (Echo, Carotid, EGD, Colonoscopy, ERCP)    Subjective: Seen and examined on the day of discharge.  Stable, no distress.  Husband at bedside.  Okay with discharge plan.  Discharge Exam: Vitals:   02/05/21 1000 02/05/21 1007  BP:  123/84  Pulse:  (!) 57  Resp:  18  Temp: 99 F (37.2 C)  SpO2:  98%   Vitals:   02/05/21 0718 02/05/21 0944 02/05/21 1000 02/05/21 1007  BP: (!) 132/59 (!) 133/54  123/84  Pulse: 97   (!) 57  Resp: 18 18  18   Temp: 98.4 F (36.9 C) 98.7 F (37.1 C) 99 F (37.2 C)   TempSrc:  Oral    SpO2: 94% 98%  98%  Weight:      Height:        General: Pt is alert, awake, not in acute distress Cardiovascular: RRR, S1/S2 +, no rubs, no gallops Respiratory: CTA bilaterally, no wheezing, no rhonchi Abdominal: Soft, NT, ND, bowel sounds  + Extremities: no edema, no cyanosis    The results of significant diagnostics from this hospitalization (including imaging, microbiology, ancillary and laboratory) are listed below for reference.     Microbiology: Recent Results (from the past 240 hour(s))  Resp Panel by RT-PCR (Flu A&B, Covid) Nasopharyngeal Swab     Status: None   Collection Time: 02/02/21 11:17 PM   Specimen: Nasopharyngeal Swab; Nasopharyngeal(NP) swabs in vial transport medium  Result Value Ref Range Status   SARS Coronavirus 2 by RT PCR NEGATIVE NEGATIVE Final    Comment: (NOTE) SARS-CoV-2 target nucleic acids are NOT DETECTED.  The SARS-CoV-2 RNA is generally detectable in upper respiratory specimens during the acute phase of infection. The lowest concentration of SARS-CoV-2 viral copies this assay can detect is 138 copies/mL. A negative result does not preclude SARS-Cov-2 infection and should not be used as the sole basis for treatment or other patient management decisions. A negative result may occur with  improper specimen collection/handling, submission of specimen other than nasopharyngeal swab, presence of viral mutation(s) within the areas targeted by this assay, and inadequate number of viral copies(<138 copies/mL). A negative result must be combined with clinical observations, patient history, and epidemiological information. The expected result is Negative.  Fact Sheet for Patients:  EntrepreneurPulse.com.au  Fact Sheet for Healthcare Providers:  IncredibleEmployment.be  This test is no t yet approved or cleared by the [place] FDA and  has been authorized for detection and/or diagnosis of SARS-CoV-2 by FDA under an Emergency Use Authorization (EUA). This EUA will remain  in effect (meaning this test can be used) for the duration of the COVID-19 declaration under Section 564(b)(1) of the Act, 21 U.S.C.section 360bbb-3(b)(1), unless the authorization  is terminated  or revoked sooner.       Influenza A by PCR NEGATIVE NEGATIVE Final   Influenza B by PCR NEGATIVE NEGATIVE Final    Comment: (NOTE) The Xpert Xpress SARS-CoV-2/FLU/RSV plus assay is intended as an aid in the diagnosis of influenza from Nasopharyngeal swab specimens and should not be used as a sole basis for treatment. Nasal washings and aspirates are unacceptable for Xpert Xpress SARS-CoV-2/FLU/RSV testing.  Fact Sheet for Patients: EntrepreneurPulse.com.au  Fact Sheet for Healthcare Providers: IncredibleEmployment.be  This test is not yet approved or cleared by the [place] FDA and has been authorized for detection and/or diagnosis of SARS-CoV-2 by FDA under an Emergency Use Authorization (EUA). This EUA will remain in effect (meaning this test can be used) for the duration of the COVID-19 declaration under Section 564(b)(1) of the Act, 21 U.S.C. section 360bbb-3(b)(1), unless the authorization is terminated or revoked.  Performed at [facility], [address] [place], [place] [address]   Blood culture (routine single)     Status: None (Preliminary result)   Collection Time: 02/02/21 11:23 PM   Specimen: BLOOD  Result  Value Ref Range Status   Specimen Description BLOOD RIGHT ANTECUBITAL  Final   Special Requests   Final    BOTTLES DRAWN AEROBIC AND ANAEROBIC Blood Culture adequate volume   Culture   Final    NO GROWTH 3 DAYS Performed at [facility], [address], [place], [place] [address]    Report Status PENDING  Incomplete  Urine culture     Status: None   Collection Time: 02/03/21 12:15 AM   Specimen: Urine, Random  Result Value Ref Range Status   Specimen Description   Final    URINE, RANDOM Performed at [facility], [address] [place], [place] [address]    Special Requests   Final    NONE Performed at [facility], [address], [place], [place]  [address]    Culture   Final    NO GROWTH Performed at [facility], [place] [address] [place], [place] [address]    Report Status 02/04/2021 FINAL  Final  Blood culture (single)     Status: None (Preliminary result)   Collection Time: 02/03/21 12:15 AM   Specimen: BLOOD  Result Value Ref Range Status   Specimen Description BLOOD RIGHT ASSIST CONTROL  Final   Special Requests   Final    BOTTLES DRAWN AEROBIC AND ANAEROBIC Blood Culture results may not be optimal due to an excessive volume of blood received in culture bottles   Culture   Final    NO GROWTH 2 DAYS Performed at [facility], [address] [place], [place] [address]    Report Status PENDING  Incomplete     Labs: BNP (last 3 results) No results for input(s): BNP in the last 8760 hours. Basic Metabolic Panel: Recent Labs  Lab 02/02/21 2323 02/03/21 0450 02/04/21 0814 02/05/21 0415  NA 124* 127* 129* 128*  K 4.7 4.4 4.0 3.9  CL 92* 97* 96* 99  CO2 24 25 25 25   GLUCOSE 96 85 96 94  BUN <5* <5* 6* <5*  CREATININE 0.78 0.78 0.81 0.78  CALCIUM 8.8* 8.4* 8.1* 8.0*   Liver Function Tests: Recent Labs  Lab 02/02/21 2323  AST 18  ALT 17  ALKPHOS 77  BILITOT 0.8  PROT 6.0*  ALBUMIN 3.2*   No results for input(s): LIPASE, AMYLASE in the last 168 hours. No results for input(s): AMMONIA in the last 168 hours. CBC: Recent Labs  Lab 02/02/21 2323 02/03/21 0420 02/04/21 0814 02/05/21 0415  WBC 13.9* 16.0* 21.2* 20.7*  NEUTROABS 8.1*  --  11.9* 11.0*  HGB 6.6* 6.2* 7.7* 7.0*  HCT 18.6* 17.4* 20.5* 19.5*  MCV 92.5 92.6 88.7 91.5  PLT 12* 13* 43* 37*   Cardiac Enzymes: No results for input(s): CKTOTAL, CKMB, CKMBINDEX, TROPONINI in the last 168 hours. BNP: Invalid input(s): POCBNP CBG: No results for input(s): GLUCAP in the last 168 hours. D-Dimer No results for input(s): DDIMER in the last 72 hours. Hgb A1c No results for input(s): HGBA1C in the last 72 hours. Lipid Profile No results  for input(s): CHOL, HDL, LDLCALC, TRIG, CHOLHDL, LDLDIRECT in the last 72 hours. Thyroid function studies No results for input(s): TSH, T4TOTAL, T3FREE, THYROIDAB in the last 72 hours.  Invalid input(s): FREET3 Anemia work up No results for input(s): VITAMINB12, FOLATE, FERRITIN, TIBC, IRON, RETICCTPCT in the last 72 hours. Urinalysis    Component Value Date/Time   COLORURINE COLORLESS (A) 02/03/2021 0015   APPEARANCEUR CLEAR (A) 02/03/2021 0015   LABSPEC  1.001 (L) 02/03/2021 0015   PHURINE 8.0 02/03/2021 0015   GLUCOSEU NEGATIVE 02/03/2021 0015   HGBUR NEGATIVE 02/03/2021 0015   BILIRUBINUR NEGATIVE 02/03/2021 0015   KETONESUR NEGATIVE 02/03/2021 0015   PROTEINUR NEGATIVE 02/03/2021 0015   NITRITE NEGATIVE 02/03/2021 0015   LEUKOCYTESUR NEGATIVE 02/03/2021 0015   Sepsis Labs Invalid input(s): PROCALCITONIN,  WBC,  LACTICIDVEN Microbiology Recent Results (from the past 240 hour(s))  Resp Panel by RT-PCR (Flu A&B, Covid) Nasopharyngeal Swab     Status: None   Collection Time: 02/02/21 11:17 PM   Specimen: Nasopharyngeal Swab; Nasopharyngeal(NP) swabs in vial transport medium  Result Value Ref Range Status   SARS Coronavirus 2 by RT PCR NEGATIVE NEGATIVE Final    Comment: (NOTE) SARS-CoV-2 target nucleic acids are NOT DETECTED.  The SARS-CoV-2 RNA is generally detectable in upper respiratory specimens during the acute phase of infection. The lowest concentration of SARS-CoV-2 viral copies this assay can detect is 138 copies/mL. A negative result does not preclude SARS-Cov-2 infection and should not be used as the sole basis for treatment or other patient management decisions. A negative result may occur with  improper specimen collection/handling, submission of specimen other than nasopharyngeal swab, presence of viral mutation(s) within the areas targeted by this assay, and inadequate number of viral copies(<138 copies/mL). A negative result must be combined with clinical  observations, patient history, and epidemiological information. The expected result is Negative.  Fact Sheet for Patients:  EntrepreneurPulse.com.au  Fact Sheet for Healthcare Providers:  IncredibleEmployment.be  This test is no t yet approved or cleared by the [place] FDA and  has been authorized for detection and/or diagnosis of SARS-CoV-2 by FDA under an Emergency Use Authorization (EUA). This EUA will remain  in effect (meaning this test can be used) for the duration of the COVID-19 declaration under Section 564(b)(1) of the Act, 21 U.S.C.section 360bbb-3(b)(1), unless the authorization is terminated  or revoked sooner.       Influenza A by PCR NEGATIVE NEGATIVE Final   Influenza B by PCR NEGATIVE NEGATIVE Final    Comment: (NOTE) The Xpert Xpress SARS-CoV-2/FLU/RSV plus assay is intended as an aid in the diagnosis of influenza from Nasopharyngeal swab specimens and should not be used as a sole basis for treatment. Nasal washings and aspirates are unacceptable for Xpert Xpress SARS-CoV-2/FLU/RSV testing.  Fact Sheet for Patients: EntrepreneurPulse.com.au  Fact Sheet for Healthcare Providers: IncredibleEmployment.be  This test is not yet approved or cleared by the [place] FDA and has been authorized for detection and/or diagnosis of SARS-CoV-2 by FDA under an Emergency Use Authorization (EUA). This EUA will remain in effect (meaning this test can be used) for the duration of the COVID-19 declaration under Section 564(b)(1) of the Act, 21 U.S.C. section 360bbb-3(b)(1), unless the authorization is terminated or revoked.  Performed at [facility], [address] [place], [place] [address]   Blood culture (routine single)     Status: None (Preliminary result)   Collection Time: 02/02/21 11:23 PM   Specimen: BLOOD  Result Value Ref Range Status   Specimen Description BLOOD  RIGHT ANTECUBITAL  Final   Special Requests   Final    BOTTLES DRAWN AEROBIC AND ANAEROBIC Blood Culture adequate volume   Culture   Final    NO GROWTH 3 DAYS Performed at [facility], [address] [place], [place] [address]    Report Status PENDING  Incomplete  Urine culture     Status: None   Collection Time:  02/03/21 12:15 AM   Specimen: Urine, Random  Result Value Ref Range Status   Specimen Description   Final    URINE, RANDOM Performed at [facility], [address] [place], [place] [address]    Special Requests   Final    NONE Performed at [facility], [address] [place], [place] [address]    Culture   Final    NO GROWTH Performed at [facility], [place] [address] [place], [place] [address]    Report Status 02/04/2021 FINAL  Final  Blood culture (single)     Status: None (Preliminary result)   Collection Time: 02/03/21 12:15 AM   Specimen: BLOOD  Result Value Ref Range Status   Specimen Description BLOOD RIGHT ASSIST CONTROL  Final   Special Requests   Final    BOTTLES DRAWN AEROBIC AND ANAEROBIC Blood Culture results may not be optimal due to an excessive volume of blood received in culture bottles   Culture   Final    NO GROWTH 2 DAYS Performed at [facility], [address] [place], [place] [address]    Report Status PENDING  Incomplete     Time coordinating discharge: Over 30 minutes  SIGNED:   Sidney Ace, MD  [facility] 02/05/2021, 1:59 PM Pager   If 7PM-7AM, please contact night-coverage

## 2021-02-06 ENCOUNTER — Encounter: Payer: Self-pay | Admitting: Oncology

## 2021-02-06 ENCOUNTER — Other Ambulatory Visit: Payer: Self-pay | Admitting: *Deleted

## 2021-02-06 DIAGNOSIS — D649 Anemia, unspecified: Secondary | ICD-10-CM

## 2021-02-06 DIAGNOSIS — C3490 Malignant neoplasm of unspecified part of unspecified bronchus or lung: Secondary | ICD-10-CM

## 2021-02-06 LAB — TYPE AND SCREEN
ABO/RH(D): O POS
Antibody Screen: NEGATIVE
Unit division: 0
Unit division: 0

## 2021-02-06 LAB — BPAM RBC
Blood Product Expiration Date: 202207062359
Blood Product Expiration Date: 202207062359
ISSUE DATE / TIME: 202206141051
ISSUE DATE / TIME: 202206160936
Unit Type and Rh: 5100
Unit Type and Rh: 5100

## 2021-02-07 ENCOUNTER — Observation Stay
Admission: EM | Admit: 2021-02-07 | Discharge: 2021-02-08 | Disposition: A | Discharge status: Home / Self Care | Payer: Medicare Other | Attending: Internal Medicine | Admitting: Internal Medicine

## 2021-02-07 ENCOUNTER — Encounter: Payer: Self-pay | Admitting: Emergency Medicine

## 2021-02-07 ENCOUNTER — Observation Stay: Payer: Medicare Other

## 2021-02-07 ENCOUNTER — Other Ambulatory Visit: Payer: Self-pay

## 2021-02-07 ENCOUNTER — Emergency Department: Payer: Medicare Other

## 2021-02-07 DIAGNOSIS — C7951 Secondary malignant neoplasm of bone: Secondary | ICD-10-CM | POA: Diagnosis not present

## 2021-02-07 DIAGNOSIS — C3490 Malignant neoplasm of unspecified part of unspecified bronchus or lung: Secondary | ICD-10-CM

## 2021-02-07 DIAGNOSIS — J449 Chronic obstructive pulmonary disease, unspecified: Secondary | ICD-10-CM | POA: Insufficient documentation

## 2021-02-07 DIAGNOSIS — D696 Thrombocytopenia, unspecified: Secondary | ICD-10-CM | POA: Diagnosis not present

## 2021-02-07 DIAGNOSIS — E871 Hypo-osmolality and hyponatremia: Secondary | ICD-10-CM

## 2021-02-07 DIAGNOSIS — E039 Hypothyroidism, unspecified: Secondary | ICD-10-CM | POA: Diagnosis not present

## 2021-02-07 DIAGNOSIS — T451X5A Adverse effect of antineoplastic and immunosuppressive drugs, initial encounter: Secondary | ICD-10-CM

## 2021-02-07 DIAGNOSIS — G9341 Metabolic encephalopathy: Secondary | ICD-10-CM

## 2021-02-07 DIAGNOSIS — Z720 Tobacco use: Secondary | ICD-10-CM

## 2021-02-07 DIAGNOSIS — Z85830 Personal history of malignant neoplasm of bone: Secondary | ICD-10-CM | POA: Diagnosis not present

## 2021-02-07 DIAGNOSIS — Z79899 Other long term (current) drug therapy: Secondary | ICD-10-CM | POA: Insufficient documentation

## 2021-02-07 DIAGNOSIS — Z860100 Personal history of colon polyps, unspecified: Secondary | ICD-10-CM

## 2021-02-07 DIAGNOSIS — M7989 Other specified soft tissue disorders: Secondary | ICD-10-CM

## 2021-02-07 DIAGNOSIS — G9340 Encephalopathy, unspecified: Secondary | ICD-10-CM

## 2021-02-07 DIAGNOSIS — R410 Disorientation, unspecified: Secondary | ICD-10-CM | POA: Diagnosis present

## 2021-02-07 DIAGNOSIS — D72829 Elevated white blood cell count, unspecified: Secondary | ICD-10-CM | POA: Diagnosis not present

## 2021-02-07 DIAGNOSIS — R531 Weakness: Secondary | ICD-10-CM

## 2021-02-07 DIAGNOSIS — I10 Essential (primary) hypertension: Secondary | ICD-10-CM | POA: Diagnosis not present

## 2021-02-07 DIAGNOSIS — A419 Sepsis, unspecified organism: Principal | ICD-10-CM

## 2021-02-07 DIAGNOSIS — Z87891 Personal history of nicotine dependence: Secondary | ICD-10-CM | POA: Diagnosis not present

## 2021-02-07 DIAGNOSIS — D61810 Antineoplastic chemotherapy induced pancytopenia: Secondary | ICD-10-CM

## 2021-02-07 DIAGNOSIS — Z85841 Personal history of malignant neoplasm of brain: Secondary | ICD-10-CM | POA: Diagnosis not present

## 2021-02-07 DIAGNOSIS — Z85118 Personal history of other malignant neoplasm of bronchus and lung: Secondary | ICD-10-CM | POA: Insufficient documentation

## 2021-02-07 DIAGNOSIS — F411 Generalized anxiety disorder: Secondary | ICD-10-CM

## 2021-02-07 DIAGNOSIS — R4182 Altered mental status, unspecified: Secondary | ICD-10-CM

## 2021-02-07 DIAGNOSIS — C7931 Secondary malignant neoplasm of brain: Secondary | ICD-10-CM

## 2021-02-07 LAB — URINALYSIS, COMPLETE (UACMP) WITH MICROSCOPIC
Bacteria, UA: NONE SEEN
Bilirubin Urine: NEGATIVE
Glucose, UA: NEGATIVE mg/dL
Hgb urine dipstick: NEGATIVE
Ketones, ur: NEGATIVE mg/dL
Leukocytes,Ua: NEGATIVE
Nitrite: NEGATIVE
Protein, ur: NEGATIVE mg/dL
Specific Gravity, Urine: 1.009 (ref 1.005–1.030)
pH: 6 (ref 5.0–8.0)

## 2021-02-07 LAB — CBC WITH DIFFERENTIAL/PLATELET
Abs Immature Granulocytes: 11.38 10*3/uL — ABNORMAL HIGH (ref 0.00–0.07)
Basophils Absolute: 0 10*3/uL (ref 0.0–0.1)
Basophils Relative: 0 %
Eosinophils Absolute: 0 10*3/uL (ref 0.0–0.5)
Eosinophils Relative: 0 %
HCT: 28.4 % — ABNORMAL LOW (ref 36.0–46.0)
Hemoglobin: 10.3 g/dL — ABNORMAL LOW (ref 12.0–15.0)
Immature Granulocytes: 31 %
Lymphocytes Relative: 6 %
Lymphs Abs: 2 10*3/uL (ref 0.7–4.0)
MCH: 31.6 pg (ref 26.0–34.0)
MCHC: 36.3 g/dL — ABNORMAL HIGH (ref 30.0–36.0)
MCV: 87.1 fL (ref 80.0–100.0)
Monocytes Absolute: 3.2 10*3/uL — ABNORMAL HIGH (ref 0.1–1.0)
Monocytes Relative: 9 %
Neutro Abs: 20.4 10*3/uL — ABNORMAL HIGH (ref 1.7–7.7)
Neutrophils Relative %: 54 %
Platelets: 103 10*3/uL — ABNORMAL LOW (ref 150–400)
RBC: 3.26 MIL/uL — ABNORMAL LOW (ref 3.87–5.11)
RDW: 18.7 % — ABNORMAL HIGH (ref 11.5–15.5)
Smear Review: NORMAL
WBC: 37.1 10*3/uL — ABNORMAL HIGH (ref 4.0–10.5)
nRBC: 0.2 % (ref 0.0–0.2)

## 2021-02-07 LAB — TROPONIN I (HIGH SENSITIVITY)
Troponin I (High Sensitivity): 24 ng/L — ABNORMAL HIGH (ref <18)
Troponin I (High Sensitivity): 26 ng/L — ABNORMAL HIGH (ref <18)

## 2021-02-07 LAB — PROCALCITONIN: Procalcitonin: 0.21 ng/mL

## 2021-02-07 LAB — CULTURE, BLOOD (SINGLE)
Culture: NO GROWTH
Special Requests: ADEQUATE

## 2021-02-07 LAB — BASIC METABOLIC PANEL
Anion gap: 9 (ref 5–15)
BUN: <5 mg/dL — ABNORMAL LOW (ref 8–23)
CO2: 24 mmol/L (ref 22–32)
Calcium: 8.7 mg/dL — ABNORMAL LOW (ref 8.9–10.3)
Chloride: 95 mmol/L — ABNORMAL LOW (ref 98–111)
Creatinine, Ser: 1.06 mg/dL — ABNORMAL HIGH (ref 0.44–1.00)
GFR, Estimated: 55 mL/min — ABNORMAL LOW (ref >60)
Glucose, Bld: 127 mg/dL — ABNORMAL HIGH (ref 70–99)
Potassium: 3.6 mmol/L (ref 3.5–5.1)
Sodium: 128 mmol/L — ABNORMAL LOW (ref 135–145)

## 2021-02-07 LAB — PROTIME-INR
INR: 1.2 (ref 0.8–1.2)
Prothrombin Time: 15.2 seconds (ref 11.4–15.2)

## 2021-02-07 LAB — APTT: aPTT: 26 seconds (ref 24–36)

## 2021-02-07 LAB — LACTIC ACID, PLASMA
Lactic Acid, Venous: 1.5 mmol/L (ref 0.5–1.9)
Lactic Acid, Venous: 1.3 mmol/L (ref 0.5–1.9)

## 2021-02-07 LAB — MRSA PCR SCREENING: MRSA by PCR: NEGATIVE

## 2021-02-07 LAB — VITAMIN B12: Vitamin B-12: 518 pg/mL (ref 180–914)

## 2021-02-07 LAB — BRAIN NATRIURETIC PEPTIDE: B Natriuretic Peptide: 224.9 pg/mL — ABNORMAL HIGH (ref 0.0–100.0)

## 2021-02-07 LAB — AMMONIA: Ammonia: 11 umol/L (ref 9–35)

## 2021-02-07 MED ORDER — SODIUM CHLORIDE 0.9 % IV BOLUS
500.0000 mL | Freq: Once | INTRAVENOUS | Status: AC
  Administered 2021-02-07: 500 mL via INTRAVENOUS

## 2021-02-07 MED ORDER — METOPROLOL SUCCINATE ER 50 MG PO TB24
50.0000 mg | ORAL_TABLET | Freq: Every day | ORAL | Status: DC
  Administered 2021-02-07 – 2021-02-08 (×2): 50 mg via ORAL
  Filled 2021-02-07 (×2): qty 1

## 2021-02-07 MED ORDER — MAGNESIUM CHLORIDE 64 MG PO TBEC
1.0000 | DELAYED_RELEASE_TABLET | Freq: Every day | ORAL | Status: DC
  Administered 2021-02-08: 10:00:00 64 mg via ORAL
  Filled 2021-02-07 (×2): qty 1

## 2021-02-07 MED ORDER — NICOTINE 14 MG/24HR TD PT24
14.0000 mg | MEDICATED_PATCH | Freq: Every day | TRANSDERMAL | Status: DC
  Administered 2021-02-07 – 2021-02-08 (×2): 14 mg via TRANSDERMAL
  Filled 2021-02-07 (×2): qty 1

## 2021-02-07 MED ORDER — NYSTATIN 100000 UNIT/ML MT SUSP
5.0000 mL | Freq: Four times a day (QID) | OROMUCOSAL | Status: DC
  Administered 2021-02-07 – 2021-02-08 (×3): 500000 [IU] via ORAL
  Filled 2021-02-07 (×6): qty 5

## 2021-02-07 MED ORDER — LORATADINE 10 MG PO TABS
10.0000 mg | ORAL_TABLET | Freq: Every day | ORAL | Status: DC
  Administered 2021-02-07 – 2021-02-08 (×2): 10 mg via ORAL
  Filled 2021-02-07 (×2): qty 1

## 2021-02-07 MED ORDER — ONDANSETRON HCL 4 MG/2ML IJ SOLN: 4.0000 mg | Freq: Four times a day (QID) | INTRAMUSCULAR | Status: DC | PRN

## 2021-02-07 MED ORDER — VANCOMYCIN HCL IN DEXTROSE 1-5 GM/200ML-% IV SOLN: 1000.0000 mg | Freq: Once | INTRAVENOUS | Status: DC

## 2021-02-07 MED ORDER — ACETAMINOPHEN 650 MG RE SUPP: 650.0000 mg | Freq: Four times a day (QID) | RECTAL | Status: DC | PRN

## 2021-02-07 MED ORDER — SODIUM CHLORIDE 0.9 % IV SOLN
2.0000 g | Freq: Once | INTRAVENOUS | Status: AC
  Administered 2021-02-07: 2 g via INTRAVENOUS
  Filled 2021-02-07: qty 2

## 2021-02-07 MED ORDER — ACETAMINOPHEN 325 MG PO TABS: 650.0000 mg | ORAL_TABLET | Freq: Four times a day (QID) | ORAL | Status: DC | PRN

## 2021-02-07 MED ORDER — LORAZEPAM 0.5 MG PO TABS
0.5000 mg | ORAL_TABLET | Freq: Four times a day (QID) | ORAL | Status: DC | PRN
  Administered 2021-02-08: 0.5 mg via ORAL
  Filled 2021-02-07: qty 1

## 2021-02-07 MED ORDER — ENOXAPARIN SODIUM 40 MG/0.4ML IJ SOSY
40.0000 mg | PREFILLED_SYRINGE | INTRAMUSCULAR | Status: DC
  Administered 2021-02-07: 40 mg via SUBCUTANEOUS
  Filled 2021-02-07: qty 0.4

## 2021-02-07 MED ORDER — ALBUTEROL SULFATE (2.5 MG/3ML) 0.083% IN NEBU
2.5000 mg | INHALATION_SOLUTION | Freq: Two times a day (BID) | RESPIRATORY_TRACT | Status: DC
  Administered 2021-02-07 – 2021-02-08 (×2): 2.5 mg via RESPIRATORY_TRACT
  Filled 2021-02-07 (×3): qty 3

## 2021-02-07 MED ORDER — ATORVASTATIN CALCIUM 20 MG PO TABS
10.0000 mg | ORAL_TABLET | Freq: Every day | ORAL | Status: DC
  Administered 2021-02-07: 22:00:00 10 mg via ORAL
  Filled 2021-02-07: qty 1

## 2021-02-07 MED ORDER — LOSARTAN POTASSIUM-HCTZ 100-25 MG PO TABS: 1.0000 | ORAL_TABLET | Freq: Every day | ORAL | Status: DC

## 2021-02-07 MED ORDER — PROCHLORPERAZINE MALEATE 10 MG PO TABS
10.0000 mg | ORAL_TABLET | ORAL | Status: DC | PRN
  Filled 2021-02-07: qty 1

## 2021-02-07 MED ORDER — LORAZEPAM 2 MG/ML IJ SOLN
1.0000 mg | Freq: Once | INTRAMUSCULAR | Status: AC | PRN
  Administered 2021-02-07: 20:00:00 1 mg via INTRAVENOUS
  Filled 2021-02-07: qty 1

## 2021-02-07 MED ORDER — ONDANSETRON HCL 4 MG PO TABS: 4.0000 mg | ORAL_TABLET | Freq: Four times a day (QID) | ORAL | Status: DC | PRN

## 2021-02-07 MED ORDER — PAROXETINE HCL 10 MG PO TABS
5.0000 mg | ORAL_TABLET | Freq: Every evening | ORAL | Status: DC
  Filled 2021-02-07 (×2): qty 0.5

## 2021-02-07 MED ORDER — LOSARTAN POTASSIUM 50 MG PO TABS
100.0000 mg | ORAL_TABLET | Freq: Every day | ORAL | Status: DC
  Administered 2021-02-08: 100 mg via ORAL
  Filled 2021-02-07: qty 2

## 2021-02-07 MED ORDER — VANCOMYCIN HCL 1500 MG/300ML IV SOLN
1500.0000 mg | Freq: Once | INTRAVENOUS | Status: AC
  Administered 2021-02-07: 1500 mg via INTRAVENOUS
  Filled 2021-02-07: qty 300

## 2021-02-07 MED ORDER — SODIUM CHLORIDE 0.9 % IV SOLN: INTRAVENOUS | Status: AC

## 2021-02-07 MED ORDER — SODIUM CHLORIDE 0.9 % IV SOLN
2.0000 g | Freq: Two times a day (BID) | INTRAVENOUS | Status: DC
  Administered 2021-02-08: 06:00:00 2 g via INTRAVENOUS
  Filled 2021-02-07 (×3): qty 2

## 2021-02-07 MED ORDER — VANCOMYCIN HCL IN DEXTROSE 1-5 GM/200ML-% IV SOLN
1000.0000 mg | INTRAVENOUS | Status: DC
  Filled 2021-02-07: qty 200

## 2021-02-07 MED ORDER — HYDROCHLOROTHIAZIDE 25 MG PO TABS
25.0000 mg | ORAL_TABLET | Freq: Every day | ORAL | Status: DC
  Administered 2021-02-08: 10:00:00 25 mg via ORAL
  Filled 2021-02-07: qty 1

## 2021-02-07 MED ORDER — CHLORHEXIDINE GLUCONATE 0.12 % MT SOLN
15.0000 mL | Freq: Two times a day (BID) | OROMUCOSAL | Status: DC
  Administered 2021-02-07 – 2021-02-08 (×2): 15 mL via OROMUCOSAL
  Filled 2021-02-07 (×2): qty 15

## 2021-02-07 MED ORDER — GADOBUTROL 1 MMOL/ML IV SOLN
5.0000 mL | Freq: Once | INTRAVENOUS | Status: AC | PRN
  Administered 2021-02-07: 7.5 mL via INTRAVENOUS

## 2021-02-07 MED ORDER — ORAL CARE MOUTH RINSE: 15.0000 mL | Freq: Two times a day (BID) | OROMUCOSAL | Status: DC

## 2021-02-07 MED ORDER — POLYETHYLENE GLYCOL 3350 17 G PO PACK: 17.0000 g | PACK | Freq: Every day | ORAL | Status: DC | PRN

## 2021-02-07 MED ORDER — LEVOTHYROXINE SODIUM 50 MCG PO TABS
50.0000 ug | ORAL_TABLET | Freq: Every day | ORAL | Status: DC
  Administered 2021-02-08: 06:00:00 50 ug via ORAL
  Filled 2021-02-07: qty 1

## 2021-02-07 NOTE — H&P (Addendum)
History and Physical   Beth Mcmillan WOE:[id number] DOB: [birth year] DOA: 02/07/2021  PCP: Idelle Crouch, MD  Outpatient Specialists: Dr. Randa Evens Patient coming from: Home  I have personally briefly reviewed patient's old medical records in [place].  Chief Concern: Altered mental status  HPI: Beth Mcmillan is a 75 y.o. 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  At bedside, patient was able to tell me her name, her age, current location of hospital, current calendar year is 2022.  She states that she is God.  She states she knows she is got.  She states that she has magical powers.  She states that she can do anything.  Daughter Beth Mcmillan is at bedside and states that at approximately 5 AM on day of admission, her spouse reports that she was altered claiming that she is God and mumbling to herself.  Daughter also reports that patient has had poor appetite since discharge from the hospital.  At home she was claiming that she was God.  Daughter denies difficulty walking.  However patient does endorse that for the last couple of days she has been feeling weak.  She was discharged from the hospital on 6/16 and family did pick medication including levofloxacin prior to going home.  They did give patient 1 dose of levofloxacin on 02/05/2021, and patient took 1 dose on 02/06/2021.  Social history: She lives at home with her husband.  She is a current tobacco user with transitioning to vaping nicotine at this time.  Previously she smoked half a pack per day, for 58 years.  Daughter at bedside states that patient does not drink EtOH and or uses recreational drug use.  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  Vaccination: Patient is vaccinated for COVID-19 with 2 doses of Moderna and 1 booster shot.  ROS: Constitutional: no weight change, no fever ENT/Mouth: no sore throat, no rhinorrhea Eyes: no eye pain, no vision changes Cardiovascular: no chest pain, no dyspnea,  no edema, no palpitations Respiratory: no cough, no sputum, no wheezing Gastrointestinal: no nausea, no vomiting, no diarrhea, no constipation Genitourinary: no urinary incontinence, no dysuria, no hematuria Musculoskeletal: no arthralgias, no myalgias Skin: no skin lesions, no pruritus, Neuro: + weakness, no loss of consciousness, no syncope Psych: no anxiety, no depression, + decrease appetite Heme/Lymph: no bruising, no bleeding  ED Course: Discussed with ED provider, patient requiring hospitalization for chief concerns of possible sepsis in setting of altered mentation.  Initial vitals in the emergency department was remarkable for temperature of 98.4, respiration rate of 20, heart rate 121, blood pressure 160/50, SPO2 of 98% on room air.  Most current vitals on my evaluation showed a heart rate of 79, respiration rate of 19, blood pressure 141/62.  Labs in the emergency department was remarkable for serum sodium 128, BUN less than 5, serum creatinine 1.06, EGFR 55, nonfasting blood glucose 127, WBC elevated at 37.1, hemoglobin 10.3, platelets 103.  Lactic acid initially was 1.5, second value is pending.  Initial troponin is 24, the second high sensitive troponin is elevated at 26.  In the emergency department patient had intermittent episode of confusion and agitation attempting to remove her Port-A-Cath.  Patient was easily redirectable with nursing staff  and daughter at bedside.  Assessment/Plan  Principal Problem:   Sepsis ([place]) Active Problems:   Personal history of colonic polyps   Small cell lung cancer in adult [facility])   Bone metastases ([place])    Hypothyroidism, acquired   GAD (generalized anxiety disorder)   Thrombocytopenia ([facility])   Hyponatremia   Acute metabolic encephalopathy   Generalized weakness   Antineoplastic chemotherapy induced pancytopenia ([place])   Encephalopathy   # Patient meets sepsis criteria with elevated heart rate, respiration rate, leukocytosis, encephalopathy # Encephalopathy-etiology work-up in progress, differentials include sepsis versus metabolic versus levofloxacin adverse reaction inducing psychosis, agitation, confusion vs progression of brain lesion secondary to presumed metastasis - Status post normal saline 500 mL bolus, cefepime 2 g IV, vancomycin IV, per EDP - We will continue with cefepime and vancomycin - Check ammonia, B12, B1, TSH - Troponin is minimally elevated with a small positive delta - Blood cultures x2 ordered and pending - CBC, BMP in the a.m. - MRI of the brain with and without contrast ordered  # Leukocytosis and with increased in all her cell lines from her baseline and in setting of chronic leukocytosis status post pegfilgrastim on 01/22/2021 and multiple infusion with decadron (see chart review below) - I suspect this is secondary to dehydration in setting of poor p.o. intake since discharge from the hospital - Status post normal saline 500 mL bolus per EDP - Ordered an additional dose of normal saline 500 mL bolus - After completion of second 500 mL bolus, we will add IVF with normal saline at 100 mL/h, for 10 hours -CBC in the a.m., if leukocytosis does not improve recommend a.m. team to consult hematologist  # Hyponatremia-secondary to hyperosmolar given volume contraction increased cell lines from baseline on CBC in setting of poor PO intake - However, other differential include SIADH  - Status post normal saline 500 mL bolus per EDP, I ordered an additional normal saline 500 mL bolus  # Small cell lung cancer with right ilium and brain metastasis - MRI on 12/24/2020: Was read  as left paramidline cerebellar lesion at 5 mm - Status post whole brain radiation and second round of combination chemotherapy  # History of chronic thrombocytopenia-suspect secondary to antineoplastic chemotherapy induced  # Generalized anxiety disorder-resumed home Ativan 0.5 mg p.o. every 6 hours.  For anxiety, - Ativan injection 1 mg IV once as needed for anxiety, can be given 30 minutes prior to MRI -Paroxetine 5 mg nightly resumed  # Hypothyroid-levothyroxine 50 mcg daily before breakfast resumed  # Primary hypertension-metoprolol succinate 50 mg p.o. daily resumed, losartan-hydrochlorothiazide 10-25 daily resumed  # Personal history of colon polyps-outpatient follow-up  # Tobacco abuse-nicotine patch daily ordered  # Given the patient presents for confusion and was age and chronic comorbidities, would recommend floor staff to allow family to stay overnight to prevent and alleviate sundowning and further confusion -Secure chat nursing staff and she is in agreement  Diet-patient can have what ever food family brings in  Chart reviewed.   Hospitalization from 02/02/2021 to 02/05/2021: She presented to the hospital at that time with symptoms of fatigue and intermittent epistasis and fevers.  Patient was given IV antibiotics and fluids with cefepime and vancomycin in the ED.  Patient was continued on empiric treatment for suspected sepsis.  Hospitalist team discussed with oncology on-call who and it was recommended patient receive 1 unit of packed red blood cell and 1 unit platelets.  Needed irradiated blood products.  Patient responded to  blood and platelet transfusion.  Hemoglobin responded to 7.7 at that time, platelets increased to 43.  Blood cultures remained negative from date of hospitalization to discharge.  On day of discharge patient was stable, cultures were negative to date, no fevers in the interval.  Patient received an additional 1 unit packed red blood cells prior to  discharge.  01/22/2021: Decadron 10 mg, etoposide 180 mg, pegfilgrastim 6 mg  01/21/2021: Decadron 10 mg, etoposide 118 mg  01/20/2021: Received the following infusion carboplatin 450 milligrams, etoposide 1 g, fosaprepitant 150 mg, dexamethasone 100 mg, palonosetron 0.25 mg  01/06/2021: WBC elevated at 60.9, hemoglobin 10.2, hematocrit 28.5, absolute neutrophils 35.3, sodium 121, chloride 86, potassium 4.4, nonfasting blood glucose 104, serum creatinine of 0.88-patient was treated with 1 L normal saline over 1 hour prior to going to radiation treatments  DVT prophylaxis: Enoxaparin 40 mg subcutaneous every 24 hours Code Status: Full code Diet: Heart healthy Family Communication: Updated daughter, Beth Mcmillan at bedside Disposition Plan: Pending clinical course Consults called: None at this time Admission status: Observation, MedSurg, 24 hours of telemetry ordered  Past Medical History:  Diagnosis Date   Anxiety    Breathing problem    low breathing function 53%   COPD (chronic obstructive pulmonary disease) ([place])    EMPHYSEMA   Dysrhythmia    tachycardia   Emphysema of lung ([place])    Hemorrhoid    History of hiatal hernia    SMALL- 2022   Hypercholesteremia    Hypertension    Hypothyroidism    Larynx polyp    Lung cancer ([place])    Palpitations    with anxiety   Platelets decreased ([place]) 2020   Seasonal allergies    Tinnitus    Vertigo    no episodes in several years   Wears dentures    partial lower   White coat syndrome with hypertension    Past Surgical History:  Procedure Laterality Date   ABDOMINAL HYSTERECTOMY     partial   COLONOSCOPY WITH PROPOFOL N/A 01/22/2016   Procedure: COLONOSCOPY WITH PROPOFOL;  Surgeon: Lucilla Lame, MD;  Location: [place];  Service: Endoscopy;  Laterality: N/A;   COLONOSCOPY WITH PROPOFOL N/A 04/12/2019   Procedure: COLONOSCOPY WITH BIOPSY;  Surgeon: Lucilla Lame, MD;  Location: [place];  Service: Endoscopy;  Laterality:  N/A;  pt would like an early appt   ct guided lung bx     IR IMAGING GUIDED PORT INSERTION  12/18/2020   POLYPECTOMY  01/22/2016   Procedure: POLYPECTOMY;  Surgeon: Lucilla Lame, MD;  Location: [place];  Service: Endoscopy;;   POLYPECTOMY N/A 04/12/2019   Procedure: POLYPECTOMY;  Surgeon: Lucilla Lame, MD;  Location: [place];  Service: Endoscopy;  Laterality: N/A;   VIDEO BRONCHOSCOPY WITH ENDOBRONCHIAL NAVIGATION N/A 11/21/2020   Procedure: VIDEO BRONCHOSCOPY WITH ENDOBRONCHIAL NAVIGATION;  Surgeon: Ottie Glazier, MD;  Location: [facility];  Service: Thoracic;  Laterality: N/A;   VIDEO BRONCHOSCOPY WITH ENDOBRONCHIAL ULTRASOUND N/A 11/21/2020   Procedure: VIDEO BRONCHOSCOPY WITH ENDOBRONCHIAL ULTRASOUND;  Surgeon: Ottie Glazier, MD;  Location: [facility];  Service: Thoracic;  Laterality: N/A;   Social History:  reports that she quit smoking about 3 months ago. Her smoking use included cigarettes. She has a 25.00 pack-year smoking history. She has never used smokeless tobacco. She reports that she does not drink alcohol and does not use drugs.  Allergies  Allergen Reactions   Augmentin [Amoxicillin-Pot Clavulanate]     "messes up my blood cells"  Codeine Nausea Only   Levaquin [Levofloxacin]     Affected lab results    Tape Rash    Some bandaids cause rash sometimes   Family History  Problem Relation Age of Onset   Alzheimer's disease Mother    Heart attack Mother    High Cholesterol Mother    Hypertension Mother    Heart block Mother    Lung cancer Father    Uterine cancer Sister    Hypertension Sister    Heart block Sister    Anxiety disorder Sister    Breast cancer Neg Hx    Family history: Family history reviewed and not pertinent  Prior to Admission medications   Medication Sig Start Date End Date Taking? Authorizing Provider  albuterol (VENTOLIN HFA) 108 (90 Base) MCG/ACT inhaler Inhale 2 puffs into the lungs 2 (two) times daily. 12/12/20 12/12/21   [provider]  atorvastatin (LIPITOR) 10 MG tablet TAKE 1 TABLET BY MOUTH DAILY 01/28/20   Cletis Athens, MD  levofloxacin (LEVAQUIN) 750 MG tablet Take 1 tablet (750 mg total) by mouth daily for 5 days. 02/06/21 02/11/21  Sidney Ace, MD  levothyroxine (SYNTHROID, LEVOTHROID) 50 MCG tablet Take 50 mcg by mouth daily before breakfast.    [provider]  lidocaine-prilocaine (EMLA) cream Apply to affected area once 12/18/20   Sindy Guadeloupe, MD  loratadine (CLARITIN) 10 MG tablet Take 10 mg by mouth daily. Reported on 01/22/2016    [provider]  LORazepam (ATIVAN) 0.5 MG tablet Take 1 tablet (0.5 mg total) by mouth every 6 (six) hours as needed for anxiety. 12/17/20   Sindy Guadeloupe, MD  losartan-hydrochlorothiazide (HYZAAR) 100-25 MG tablet Take 1 tablet by mouth daily.    [provider]  magic mouthwash SOLN Take 15 mLs by mouth 3 (three) times daily for 7 days. 02/05/21 02/12/21  Sidney Ace, MD  magnesium chloride (SLOW-MAG) 64 MG TBEC SR tablet Take 1 tablet (64 mg total) by mouth daily. 01/22/21   Earlie Server, MD  Magnesium Chloride-Calcium 64-106 MG TBEC Take 1 tablet by mouth daily. 01/20/21   Earlie Server, MD  metoprolol succinate (TOPROL-XL) 50 MG 24 hr tablet Take 50 mg by mouth daily. 07/01/14   [provider]  nystatin (MYCOSTATIN) 100000 UNIT/ML suspension Take 5 mLs (500,000 Units total) by mouth 4 (four) times daily. 01/31/21   Lloyd Huger, MD  PARoxetine (PAXIL) 10 MG tablet Take 5 mg by mouth every evening.    [provider]  potassium chloride SA (KLOR-CON) 20 MEQ tablet Take 1 tablet (20 mEq total) by mouth daily. 01/20/21   Earlie Server, MD  sodium chloride 1 g tablet Take 1 tablet (1 g total) by mouth 3 (three) times daily. 01/20/21   Earlie Server, MD   Physical Exam: Vitals:   02/07/21 1052 02/07/21 1230 02/07/21 1315 02/07/21 1345  BP:      Pulse:      Resp:  16 20 (!) 24  Temp:      TempSrc:      SpO2:       Weight: 66.3 kg     Height: _0  (1.702 m)      Constitutional: appears age-appropriate, frail, NAD, calm, comfortable Eyes: PERRL, lids and conjunctivae normal ENMT: Mucous membranes are moist. Posterior pharynx clear of any exudate or lesions. Age-appropriate dentition. Hearing appropriate.  Hair loss Neck: normal, supple, no masses, no thyromegaly Respiratory: clear to auscultation bilaterally, no wheezing, no crackles. Normal  respiratory effort. No accessory muscle use.  Cardiovascular: Regular rate and rhythm, no murmurs / rubs / gallops. No extremity edema. 2+ pedal pulses. No carotid bruits.  Abdomen: no tenderness, no masses palpated, no hepatosplenomegaly. Bowel sounds positive.  Musculoskeletal: no clubbing / cyanosis. No joint deformity upper and lower extremities. Good ROM, no contractures, no atrophy. Normal muscle tone.  Skin: no rashes, lesions, ulcers. No induration Neurologic: Sensation intact. Strength 5/5 in all 4.  Psychiatric: Normal judgment and insight. Alert and oriented x 3. Normal mood.   EKG: independently reviewed, showing sinus tachycardia with rate of 121, QTc 424  Chest x-ray on Admission: I personally reviewed and I agree with radiologist reading as below.  DG Chest 2 View  Result Date: 02/07/2021 CLINICAL DATA:  Sepsis, COPD EXAM: CHEST - 2 VIEW COMPARISON:  02/02/2021, 11/19/2020 FINDINGS: Right-sided chest port terminates at the level of the superior cavoatrial junction. Heart size is normal. Atherosclerotic calcification of the aortic knob. 1.5 cm left lower lobe lung nodule, similar in size to the recent prior chest x-ray. Lungs are hyperinflated and appear otherwise clear. No pleural effusion or pneumothorax. IMPRESSION: No acute cardiopulmonary abnormality. Electronically Signed   By: Davina Poke D.O.   On: 02/07/2021 13:32   CT Head Wo Contrast  Result Date: 02/07/2021 CLINICAL DATA:  Patient with confusion. EXAM: CT HEAD WITHOUT CONTRAST  TECHNIQUE: Contiguous axial images were obtained from the base of the skull through the vertex without intravenous contrast. COMPARISON:  Brain CT 02/03/2021. FINDINGS: Brain: Ventricles and sulci are appropriate for patient's age. No evidence for acute cortically based infarct, intracranial hemorrhage, mass lesion or mass-effect. Vascular: Unremarkable. Skull: Intact. Sinuses/Orbits: Paranasal sinuses well aerated. Mastoid air cells are unremarkable. Orbits are unremarkable. Other: None. IMPRESSION: No acute intracranial process. Electronically Signed   By: Lovey Newcomer M.D.   On: 02/07/2021 13:18    Labs on Admission: I have personally reviewed following labs  CBC: Recent Labs  Lab 02/02/21 2323 02/03/21 0420 02/04/21 0814 02/05/21 0415 02/07/21 1102  WBC 13.9* 16.0* 21.2* 20.7* 37.1*  NEUTROABS 8.1*  --  11.9* 11.0* 20.4*  HGB 6.6* 6.2* 7.7* 7.0* 10.3*  HCT 18.6* 17.4* 20.5* 19.5* 28.4*  MCV 92.5 92.6 88.7 91.5 87.1  PLT 12* 13* 43* 37* 863*   Basic Metabolic Panel: Recent Labs  Lab 02/02/21 2323 02/03/21 0450 02/04/21 0814 02/05/21 0415 02/07/21 1102  NA 124* 127* 129* 128* 128*  K 4.7 4.4 4.0 3.9 3.6  CL 92* 97* 96* 99 95*  CO2 _0 GLUCOSE 96 85 96 94 127*  BUN <5* <5* 6* <5* <5*  CREATININE 0.78 0.78 0.81 0.78 1.06*  CALCIUM 8.8* 8.4* 8.1* 8.0* 8.7*   GFR: Estimated Creatinine Clearance: 46 mL/min (A) (by C-G formula based on SCr of 1.06 mg/dL (H)).  Liver Function Tests: Recent Labs  Lab 02/02/21 2323  AST 18  ALT 17  ALKPHOS 77  BILITOT 0.8  PROT 6.0*  ALBUMIN 3.2*   Coagulation Profile: Recent Labs  Lab 02/02/21 2323  INR 1.1   Urine analysis:    Component Value Date/Time   COLORURINE COLORLESS (A) 02/03/2021 0015   APPEARANCEUR CLEAR (A) 02/03/2021 0015   LABSPEC 1.001 (L) 02/03/2021 0015   PHURINE 8.0 02/03/2021 0015   GLUCOSEU NEGATIVE 02/03/2021 0015   HGBUR NEGATIVE 02/03/2021 0015   BILIRUBINUR NEGATIVE 02/03/2021 0015    KETONESUR NEGATIVE 02/03/2021 0015   PROTEINUR NEGATIVE 02/03/2021 0015   NITRITE NEGATIVE 02/03/2021 0015  LEUKOCYTESUR NEGATIVE 02/03/2021 0015   CRITICAL CARE Performed by: Briant Cedar Anihya Tuma  Total critical care time: 40 minutes  Critical care time was exclusive of separately billable procedures and treating other patients.  Critical care was necessary to treat or prevent imminent or life-threatening deterioration. Sepsis  Critical care was time spent personally by me on the following activities: development of treatment plan with patient and/or surrogate as well as nursing, discussions with consultants, evaluation of patient's response to treatment, examination of patient, obtaining history from patient or surrogate, ordering and performing treatments and interventions, ordering and review of laboratory studies, ordering and review of radiographic studies, pulse oximetry and re-evaluation of patient's condition.  Adabelle Griffiths N Makaelyn Aponte D.O. [facility]  If 7PM-7AM, please contact overnight-coverage provider If 7AM-7PM, please contact day coverage provider [email or web address]  02/07/2021, 2:52 PM

## 2021-02-07 NOTE — Consult Note (Signed)
PHARMACY -  BRIEF ANTIBIOTIC NOTE   Pharmacy has received consult(s) for Vancomycin & Cefepime from an ED provider.  The patient's profile has been reviewed for ht/wt/allergies/indication/available labs.    One time order(s) placed for: Cefepime 2g x1 Vancomycin 1.5g x1  Further antibiotics/pharmacy consults should be ordered by admitting physician if indicated.                       Thank you, Lorna Dibble 02/07/2021  2:39 PM

## 2021-02-07 NOTE — Final Consult Note (Signed)
Pharmacy Antibiotic Note  Beth Mcmillan is a 75 y.o. female w/ h/o SCLC w/ bone and brain metastases presenting with episode of confusion and admitted on 02/07/2021  for sepsis and unk source .  Pharmacy has been consulted for Vancomycin and Cefepime dosing.  Plan: Cefepime 2g x1; Cefepime 2g q12h Augmentin "messes up my blood cells"  Pt has tolerated Cefepime prior and on this admission. Received Vancomycin 1.5g x1 loading dose in ED; then Vanc 1g q24h Predicted AUC 492, Cmax 32.7, Cmin 12.3 (IBW, Scr 1.06 [BL 0.7-0.8], Vd 0.72)  Height: 5\' 7"  (170.2 cm) Weight: 66.3 kg (146 lb 0.9 oz) IBW/kg (Calculated) : 61.6  Temp (24hrs), Avg:98.4 F (36.9 C), Min:98.4 F (36.9 C), Max:98.4 F (36.9 C)  Recent Labs  Lab 02/02/21 2323 02/03/21 0127 02/03/21 0420 02/03/21 0450 02/04/21 0814 02/05/21 0415 02/07/21 1102 02/07/21 1206  WBC 13.9*  --  16.0*  --  21.2* 20.7* 37.1*  --   CREATININE 0.78  --   --  0.78 0.81 0.78 1.06*  --   LATICACIDVEN 2.3* 1.9  --   --   --   --   --  1.5    Estimated Creatinine Clearance: 46 mL/min (A) (by C-G formula based on SCr of 1.06 mg/dL (H)).    Allergies  Allergen Reactions   Augmentin [Amoxicillin-Pot Clavulanate]     "messes up my blood cells"   Codeine Nausea Only   Levaquin [Levofloxacin]     Affected lab results    Tape Rash    Some bandaids cause rash sometimes    Antimicrobials this admission: VAN (6/18 >>  CFP (6/18 >>   Dose adjustments this admission: Will need to monitor renal adjustment given Scr 1.06 [BL 0.7-0.8]  Microbiology results: 6/18 BCx: sent/pending 6/18 MRSA PCR: ordered  Thank you for allowing pharmacy to be a part of this patient's care.  Lorna Dibble 02/07/2021 3:39 PM

## 2021-02-07 NOTE — ED Provider Notes (Signed)
With  [facility] Emergency Department Provider Note ____________________________________________   Event Date/Time   First MD Initiated Contact with Patient 02/07/21 1135     (approximate)  I have reviewed the triage vital signs and the nursing notes.   HISTORY  Chief Complaint No chief complaint on file.    HPI Beth Mcmillan is a 76 y.o. 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  Per the daughter, the patient was sitting in a recliner with no clothes on.  She started talking about going to heaven and stated "I am God" and continued talking about taking her family home.  She named numerous relevant family members who she said she would take with her.  The daughter states that during this time the patient appeared fully alert.  The patient herself does not remember saying these things.  She states that she does feels fine and denies any acute complaints at this time.  Past Medical History:  Diagnosis Date   Anxiety    Breathing problem    low breathing function 53%   COPD (chronic obstructive pulmonary disease) (HCC)    EMPHYSEMA   Dysrhythmia    tachycardia   Emphysema of lung (HCC)    Hemorrhoid    History of hiatal hernia    SMALL- 2022   Hypercholesteremia    Hypertension    Hypothyroidism    Larynx polyp    Lung cancer ([place])    Palpitations    with anxiety   Platelets decreased ([place]) 2020   Seasonal allergies    Tinnitus    Vertigo    no episodes in several years   Wears dentures    partial lower   White coat syndrome with hypertension     Patient Active Problem List   Diagnosis Date Noted   Sepsis ([place]) 02/03/2021   Anemia associated with chemotherapy 02/03/2021   Thrombocytopenia ([place]) 02/03/2021   Hyponatremia 62/95/2841   Acute metabolic encephalopathy 32/44/0102   Generalized weakness  02/03/2021   Antineoplastic chemotherapy induced pancytopenia (HCC)    Bone metastases ([place]) 01/01/2021   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 ([place]) 12/26/2020   Goals of care, counseling/discussion 12/13/2020   Small cell lung cancer in adult [facility]) 12/13/2020   Hypothyroidism, acquired 10/02/2019   GAD (generalized anxiety disorder) 10/02/2019   Personal history of colonic polyps    Polyp of transverse colon    Special screening for malignant neoplasms, colon    Benign neoplasm of cecum    Benign neoplasm of ascending colon     Past Surgical History:  Procedure Laterality Date   ABDOMINAL HYSTERECTOMY     partial   COLONOSCOPY WITH PROPOFOL N/A 01/22/2016   Procedure: COLONOSCOPY WITH PROPOFOL;  Surgeon: Lucilla Lame, MD;  Location: [place];  Service: Endoscopy;  Laterality: N/A;   COLONOSCOPY WITH PROPOFOL N/A 04/12/2019   Procedure: COLONOSCOPY WITH BIOPSY;  Surgeon: Lucilla Lame, MD;  Location: [place];  Service: Endoscopy;  Laterality: N/A;  pt would like an early appt   ct guided lung bx     IR IMAGING GUIDED PORT INSERTION  12/18/2020   POLYPECTOMY  01/22/2016   Procedure: POLYPECTOMY;  Surgeon: Lucilla Lame, MD;  Location: [place];  Service: Endoscopy;;   POLYPECTOMY N/A 04/12/2019   Procedure: POLYPECTOMY;  Surgeon: Lucilla Lame, MD;  Location: [place]  [facility];  Service: Endoscopy;  Laterality: N/A;   VIDEO BRONCHOSCOPY WITH ENDOBRONCHIAL NAVIGATION N/A 11/21/2020   Procedure: VIDEO BRONCHOSCOPY WITH ENDOBRONCHIAL NAVIGATION;  Surgeon: Ottie Glazier, MD;  Location: [facility];  Service: Thoracic;  Laterality: N/A;   VIDEO BRONCHOSCOPY WITH ENDOBRONCHIAL ULTRASOUND N/A 11/21/2020   Procedure: VIDEO BRONCHOSCOPY WITH ENDOBRONCHIAL ULTRASOUND;  Surgeon: Ottie Glazier, MD;  Location: [facility];  Service: Thoracic;  Laterality: N/A;    Prior to Admission medications   Medication Sig Start Date End Date Taking? Authorizing Provider   albuterol (VENTOLIN HFA) 108 (90 Base) MCG/ACT inhaler Inhale 2 puffs into the lungs 2 (two) times daily. 12/12/20 12/12/21  [provider]  atorvastatin (LIPITOR) 10 MG tablet TAKE 1 TABLET BY MOUTH DAILY 01/28/20   Cletis Athens, MD  levofloxacin (LEVAQUIN) 750 MG tablet Take 1 tablet (750 mg total) by mouth daily for 5 days. 02/06/21 02/11/21  Sidney Ace, MD  levothyroxine (SYNTHROID, LEVOTHROID) 50 MCG tablet Take 50 mcg by mouth daily before breakfast.    [provider]  lidocaine-prilocaine (EMLA) cream Apply to affected area once 12/18/20   Sindy Guadeloupe, MD  loratadine (CLARITIN) 10 MG tablet Take 10 mg by mouth daily. Reported on 01/22/2016    [provider]  LORazepam (ATIVAN) 0.5 MG tablet Take 1 tablet (0.5 mg total) by mouth every 6 (six) hours as needed for anxiety. 12/17/20   Sindy Guadeloupe, MD  losartan-hydrochlorothiazide (HYZAAR) 100-25 MG tablet Take 1 tablet by mouth daily.    [provider]  magic mouthwash SOLN Take 15 mLs by mouth 3 (three) times daily for 7 days. 02/05/21 02/12/21  Sidney Ace, MD  magnesium chloride (SLOW-MAG) 64 MG TBEC SR tablet Take 1 tablet (64 mg total) by mouth daily. 01/22/21   Earlie Server, MD  Magnesium Chloride-Calcium 64-106 MG TBEC Take 1 tablet by mouth daily. 01/20/21   Earlie Server, MD  metoprolol succinate (TOPROL-XL) 50 MG 24 hr tablet Take 50 mg by mouth daily. 07/01/14   [provider]  nystatin (MYCOSTATIN) 100000 UNIT/ML suspension Take 5 mLs (500,000 Units total) by mouth 4 (four) times daily. 01/31/21   Lloyd Huger, MD  PARoxetine (PAXIL) 10 MG tablet Take 5 mg by mouth every evening.    [provider]  potassium chloride SA (KLOR-CON) 20 MEQ tablet Take 1 tablet (20 mEq total) by mouth daily. 01/20/21   Earlie Server, MD  sodium chloride 1 g tablet Take 1 tablet (1 g total) by mouth 3 (three) times daily. 01/20/21   Earlie Server, MD    Allergies Augmentin [amoxicillin-pot  clavulanate], Codeine, Levaquin [levofloxacin], and Tape  Family History  Problem Relation Age of Onset   Alzheimer's disease Mother    Heart attack Mother    High Cholesterol Mother    Hypertension Mother    Heart block Mother    Lung cancer Father    Uterine cancer Sister    Hypertension Sister    Heart block Sister    Anxiety disorder Sister    Breast cancer Neg Hx     Social History Social History   Tobacco Use   Smoking status: Former    Packs/day: 0.50    Years: 50.00    Pack years: 25.00    Types: Cigarettes    Quit date: 11/03/2020    Years since quitting: 0.2   Smokeless tobacco: Never  Vaping Use   Vaping Use: Some days  Substance Use Topics   Alcohol use: No  Drug use: Never    Review of Systems  Constitutional: No fever. Eyes: No redness. ENT: No sore throat. Cardiovascular: Denies chest pain. Respiratory: Denies shortness of breath. Gastrointestinal: No vomiting or diarrhea.  Genitourinary: Negative for dysuria.  Musculoskeletal: Negative for back pain. Skin: Negative for rash. Neurological: Negative for headaches, focal weakness or numbness.   ____________________________________________   PHYSICAL EXAM:  VITAL SIGNS: ED Triage Vitals  Enc Vitals Group     BP 02/07/21 1051 (!) 160/50     Pulse Rate 02/07/21 1051 (!) 121     Resp 02/07/21 1051 20     Temp 02/07/21 1051 98.4 F (36.9 C)     Temp Source 02/07/21 1051 Oral     SpO2 02/07/21 1051 98 %     Weight 02/07/21 1052 146 lb 0.9 oz (66.3 kg)     Height 02/07/21 1052 5\' 7"  (1.702 m)     Head Circumference --      Peak Flow --      Pain Score 02/07/21 1052 0     Pain Loc --      Pain Edu? --      Excl. in [place]? --     Constitutional: Alert and oriented. Well appearing and in no acute distress. Eyes: Conjunctivae are normal.  Head: Atraumatic. Nose: No congestion/rhinnorhea. Mouth/Throat: Mucous membranes are moist.   Neck: Normal range of motion.  Cardiovascular: Normal  rate, regular rhythm. Grossly normal heart sounds.  Good peripheral circulation. Respiratory: Normal respiratory effort.  No retractions. Lungs CTAB. Gastrointestinal: Soft and nontender. No distention.  Genitourinary: No CVA tenderness. Musculoskeletal: No lower extremity edema.  Extremities warm and well perfused.  Neurologic:  Normal speech and language.  Motor and sensory intact in all extremities.  Normal coordination.  No ataxia.  No pronator drift. Skin:  Skin is warm and dry. No rash noted. Psychiatric: Mood and affect are normal. Speech and behavior are normal.  ____________________________________________   LABS (all labs ordered are listed, but only abnormal results are displayed)  Labs Reviewed  CBC WITH DIFFERENTIAL/PLATELET - Abnormal; Notable for the following components:      Result Value   WBC 37.1 (*)    RBC 3.26 (*)    Hemoglobin 10.3 (*)    HCT 28.4 (*)    MCHC 36.3 (*)    RDW 18.7 (*)    Platelets 103 (*)    Neutro Abs 20.4 (*)    Monocytes Absolute 3.2 (*)    Abs Immature Granulocytes 11.38 (*)    All other components within normal limits  BASIC METABOLIC PANEL - Abnormal; Notable for the following components:   Sodium 128 (*)    Chloride 95 (*)    Glucose, Bld 127 (*)    BUN <5 (*)    Creatinine, Ser 1.06 (*)    Calcium 8.7 (*)    GFR, Estimated 55 (*)    All other components within normal limits  TROPONIN I (HIGH SENSITIVITY) - Abnormal; Notable for the following components:   Troponin I (High Sensitivity) 24 (*)    All other components within normal limits  CULTURE, BLOOD (ROUTINE X 2)  CULTURE, BLOOD (ROUTINE X 2)  LACTIC ACID, PLASMA  LACTIC ACID, PLASMA  URINALYSIS, COMPLETE (UACMP) WITH MICROSCOPIC  TROPONIN I (HIGH SENSITIVITY)   ____________________________________________  EKG  ED ECG REPORT I, Arta Silence, the attending physician, personally viewed and interpreted this ECG.  Date: 02/07/2021 EKG Time: 1210 Rate:  121 Rhythm: Sinus tachycardia frequent PVCs  QRS Axis: normal Intervals: normal ST/T Wave abnormalities: normal Narrative Interpretation: no evidence of acute ischemia  ____________________________________________  RADIOLOGY  CT head: No ICH or other acute abnormality Chest x-ray interpreted by me shows no focal consolidation or edema  ____________________________________________   PROCEDURES  Procedure(s) performed: No  Procedures  Critical Care performed: No ____________________________________________   INITIAL IMPRESSION / ASSESSMENT AND PLAN / ED COURSE  Pertinent labs & imaging results that were available during my care of the patient were reviewed by me and considered in my medical decision making (see chart for details).   75 year old female with PMH as noted above including metastatic small cell lung cancer presents with confusion this morning, specifically speaking about being God or going to God and naming family members that were going with her to heaven.  Per the daughter, she is closer to her baseline now although still occasionally saying similar things.  The patient herself has no complaints.  I reviewed the past medical records in [place].  Most notably, the patient was just admitted earlier this month for altered mental status and presumed sepsis with leukocytosis and elevated lactate although no source of infection was found.  She was also significantly anemic and thrombocytopenic.  On exam today, the patient is overall well-appearing.  Her vital signs are normal except for tachycardia and mild hypertension.  Neurologic exam is nonfocal.  She is alert and oriented and answering all my questions appropriately.  Differential is very broad but includes delirium related to dehydration, electrolyte abnormality, other metabolic cause, CNS cause including brain metastases, recurrent infection/sepsis, medication side effects.  We will obtain a CT head, lab work-up, and  reassess.  ----------------------------------------- 2:40 PM on 02/07/2021 -----------------------------------------  CT head is unremarkable.  The labs are significant for leukocytosis of 37 which is higher than during the patient's most recent admission.  Given this finding, the altered mental status, and the tachycardia, I am concerned for possible sepsis.  I have ordered fluids and broad-spectrum antibiotics.  We will admit for further work-up and monitoring.  I consulted Dr. Tobie Poet from the hospitalist service  ____________________________________________   FINAL CLINICAL IMPRESSION(S) / ED DIAGNOSES  Final diagnoses:  Altered mental status, unspecified altered mental status type      NEW MEDICATIONS STARTED DURING THIS VISIT:  New Prescriptions   No medications on file     Note:  This document was prepared using Dragon voice recognition software and may include unintentional dictation errors.    Arta Silence, MD 02/07/21 1441

## 2021-02-07 NOTE — ED Notes (Signed)
Pt provided crackers and water. Daughter remains at bedside.

## 2021-02-07 NOTE — ED Notes (Signed)
Pt provided supper tray.

## 2021-02-07 NOTE — ED Triage Notes (Signed)
Pt states that she has no idea why her daughter brought her here. Her daughter states that pt is confused. Daughter states that she was just discharged for sepsis and abnormal blood work. Daughter called her PMD and they told her to come in here because daughter states that pt told her "I am God, get ready, I am here to take you home and I will let you know when its time to go."  Pt is alert and oriented times 4.

## 2021-02-08 DIAGNOSIS — L03115 Cellulitis of right lower limb: Secondary | ICD-10-CM

## 2021-02-08 DIAGNOSIS — A419 Sepsis, unspecified organism: Secondary | ICD-10-CM | POA: Diagnosis not present

## 2021-02-08 DIAGNOSIS — G9341 Metabolic encephalopathy: Secondary | ICD-10-CM | POA: Diagnosis not present

## 2021-02-08 LAB — BASIC METABOLIC PANEL
Anion gap: 4 — ABNORMAL LOW (ref 5–15)
BUN: <5 mg/dL — ABNORMAL LOW (ref 8–23)
CO2: 23 mmol/L (ref 22–32)
Calcium: 7.7 mg/dL — ABNORMAL LOW (ref 8.9–10.3)
Chloride: 105 mmol/L (ref 98–111)
Creatinine, Ser: 0.74 mg/dL (ref 0.44–1.00)
GFR, Estimated: >60 mL/min (ref >60)
Glucose, Bld: 97 mg/dL (ref 70–99)
Potassium: 3.6 mmol/L (ref 3.5–5.1)
Sodium: 132 mmol/L — ABNORMAL LOW (ref 135–145)

## 2021-02-08 LAB — CBC
HCT: 23.2 % — ABNORMAL LOW (ref 36.0–46.0)
Hemoglobin: 8 g/dL — ABNORMAL LOW (ref 12.0–15.0)
MCH: 30.2 pg (ref 26.0–34.0)
MCHC: 34.5 g/dL (ref 30.0–36.0)
MCV: 87.5 fL (ref 80.0–100.0)
Platelets: 114 10*3/uL — ABNORMAL LOW (ref 150–400)
RBC: 2.65 MIL/uL — ABNORMAL LOW (ref 3.87–5.11)
RDW: 18.4 % — ABNORMAL HIGH (ref 11.5–15.5)
WBC: 27.1 10*3/uL — ABNORMAL HIGH (ref 4.0–10.5)
nRBC: 0.3 % — ABNORMAL HIGH (ref 0.0–0.2)

## 2021-02-08 LAB — CULTURE, BLOOD (SINGLE): Culture: NO GROWTH

## 2021-02-08 LAB — TSH: TSH: 3.321 u[IU]/mL (ref 0.350–4.500)

## 2021-02-08 MED ORDER — AMOXICILLIN-POT CLAVULANATE 875-125 MG PO TABS: 1.0000 | ORAL_TABLET | Freq: Two times a day (BID) | ORAL | 0 refills | Status: DC

## 2021-02-08 MED ORDER — DRONABINOL 2.5 MG PO CAPS: 2.5000 mg | ORAL_CAPSULE | Freq: Two times a day (BID) | ORAL | 0 refills | Status: DC

## 2021-02-08 MED ORDER — CHLORHEXIDINE GLUCONATE CLOTH 2 % EX PADS: 6.0000 | MEDICATED_PAD | Freq: Every day | CUTANEOUS | Status: DC

## 2021-02-08 MED ORDER — VANCOMYCIN HCL 1250 MG/250ML IV SOLN
1250.0000 mg | INTRAVENOUS | Status: DC
  Filled 2021-02-08: qty 250

## 2021-02-08 MED ORDER — NICOTINE 14 MG/24HR TD PT24: 14.0000 mg | MEDICATED_PATCH | Freq: Every day | TRANSDERMAL | 0 refills | Status: DC

## 2021-02-08 MED ORDER — QUETIAPINE FUMARATE 25 MG PO TABS: 25.0000 mg | ORAL_TABLET | Freq: Every day | ORAL | 0 refills | Status: DC

## 2021-02-08 NOTE — Final Consult Note (Signed)
Pharmacy Antibiotic Note  Beth Mcmillan is a 75 y.o. female w/ h/o SCLC w/ bone and brain metastases presenting with episode of confusion and admitted on 02/07/2021  for sepsis and unk source .  Pharmacy has been consulted for Vancomycin and Cefepime dosing.  Plan: Cefepime 2g x1; Cefepime 2g q12h Augmentin "messes up my blood cells"  Pt has tolerated Cefepime prior and on this admission. Received Vancomycin 1.5g x1 loading dose in ED; then Vanc 1g q24h Predicted AUC 492, Cmax 32.7, Cmin 12.3 (IBW, Scr 1.06 [BL 0.7-0.8], Vd 0.72)   6/19:  Scr improved 1.06>>0.74    -Will adjust Vancomycin to 1250 mg IV q24h  Goal AUC 400-550. Expected AUC: 476 SCr used: 0.80   (actual 0.74)  -borderline for Cefepime adjustment. Will f/u as appears pt might be discharged       Height: 5\' 7"  (170.2 cm) Weight: 66.3 kg (146 lb 0.9 oz) IBW/kg (Calculated) : 61.6  Temp (24hrs), Avg:98.3 F (36.8 C), Min:98 F (36.7 C), Max:98.7 F (37.1 C)  Recent Labs  Lab 02/02/21 2323 02/03/21 0127 02/03/21 0420 02/03/21 0450 02/04/21 0814 02/05/21 0415 02/07/21 1102 02/07/21 1206 02/07/21 1725 02/08/21 0554  WBC 13.9*  --  16.0*  --  21.2* 20.7* 37.1*  --   --  27.1*  CREATININE 0.78  --   --  0.78 0.81 0.78 1.06*  --   --  0.74  LATICACIDVEN 2.3* 1.9  --   --   --   --   --  1.5 1.3  --      Estimated Creatinine Clearance: 60.9 mL/min (by C-G formula based on SCr of 0.74 mg/dL).    Allergies  Allergen Reactions   Augmentin [Amoxicillin-Pot Clavulanate]     "messes up my blood cells"   Codeine Nausea Only   Levaquin [Levofloxacin]     Affected lab results    Tape Rash    Some bandaids cause rash sometimes    Antimicrobials this admission: VAN (6/18 >>  CFP (6/18 >>   Dose adjustments this admission: 6/19 Vanc 1 gram IV q24h to 1250 mg Q24h   (scr improved)  Microbiology results: 6/18 BCx: NG<24h 6/18 MRSA PCR: ordered  Thank you for allowing pharmacy to be a part of this  patient's care.  Natika Geyer A 02/08/2021 10:28 AM

## 2021-02-08 NOTE — Plan of Care (Signed)
End of Shift Summary:  MRI completed, given ativan x1. IVF maintained. (+) BM, urine output adequate. Alert and oriented x4, forgetful. Remained free from falls or injury. Bed low and in locked position. Daughter at bedside.   Problem: Clinical Measurements: Goal: Ability to maintain clinical measurements within normal limits will improve Outcome: Progressing Goal: Will remain free from infection Outcome: Progressing Goal: Diagnostic test results will improve Outcome: Progressing Goal: Respiratory complications will improve Outcome: Progressing Goal: Cardiovascular complication will be avoided Outcome: Progressing   Problem: Activity: Goal: Risk for activity intolerance will decrease Outcome: Progressing   Problem: Coping: Goal: Level of anxiety will decrease Outcome: Progressing   Problem: Nutrition: Goal: Adequate nutrition will be maintained Outcome: Progressing   Problem: Elimination: Goal: Will not experience complications related to bowel motility Outcome: Progressing Goal: Will not experience complications related to urinary retention Outcome: Progressing   Problem: Education: Goal: Knowledge of General Education information will improve Description: Including pain rating scale, medication(s)/side effects and non-pharmacologic comfort measures Outcome: Progressing

## 2021-02-08 NOTE — Discharge Summary (Signed)
Physician Discharge Summary  Patient ID: Beth Mcmillan MRN: [id number] DOB/AGE: 1945/10/24 75 y.o.  Admit date: 02/07/2021 Discharge date: 02/08/2021  Admission Diagnoses:  Discharge Diagnoses:  Principal Problem:   Sepsis ([place]) Active Problems:   Personal history of colonic polyps   Small cell lung cancer in adult [facility])   Bone metastases ([place])   Hypothyroidism, acquired   GAD (generalized anxiety disorder)   Thrombocytopenia ([facility])   Hyponatremia   Acute metabolic encephalopathy   Generalized weakness   Antineoplastic chemotherapy induced pancytopenia ([facility])   Encephalopathy   Tobacco abuse Right lower extremity cellulitis. Delirium due to sleep deprivation. Lung cancer with brain metastasis. Chronic thrombocytopenia Essential hypertension.  Discharged Condition: fair  Hospital Course:  Beth Mcmillan is a 75 y.o. 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 Patient and and daughter states that the patient was in the hospital for a few days, she could not sleep in the hospital.  By the time she reached home, he did not sleep well.  She was very confused before came to the hospital last night.  She was given Ativan overnight, she had a good night sleep.  This morning, she is alert and oriented to time place and person, she no longer has any delusion.  At this point, she is medically stable to be discharged.  During our conversation and exam, she was found to have right lower extremity cellulitis.  There is a small blister with old blood.  Her procalcitonin level is mildly elevated.  I will change antibiotic from Levaquin from last admission to Augmentin.  I confirmed with the daughter  that patient does not have a true allergy to Augmentin, she had stomach upset and diarrhea.  She was advised to take medicine after a meal, also take some yogurt with it. Patient also has significant anorexia with recent chemotherapy, she is prescribed with a week dose of Marinol. I also prescribed lower dose Seroquel to help her sleep at night. I have reviewed her MRI of the brain and it has a 3 mm metastatic lesion from her small cell lung cancer.  She will be followed with her oncology as outpatient.     Consults: None  Significant Diagnostic Studies:   MRI HEAD WITHOUT AND WITH CONTRAST   TECHNIQUE: Multiplanar, multiecho pulse sequences of the brain and surrounding structures were obtained without and with intravenous contrast.   CONTRAST:  7.12mL GADAVIST GADOBUTROL 1 MMOL/ML IV SOLN   COMPARISON:  12/24/2020   FINDINGS: Brain: No acute infarct, mass effect or extra-axial collection. Single focus of high right convexity chronic microhemorrhage. There is multifocal hyperintense T2-weighted signal within the white matter. Parenchymal volume and CSF spaces are normal. The midline structures are normal. Punctate contrast enhancing lesion in the posterior right parietal lobe measuring 3 mm (series 21, image 9). No other contrast-enhancing lesions.   Vascular: Major flow voids are preserved.   Skull and upper cervical spine: Normal calvarium and skull base. Visualized upper cervical spine and soft tissues are normal.   Sinuses/Orbits:No paranasal sinus fluid levels or advanced mucosal thickening. No mastoid or middle ear effusion. Normal orbits.   IMPRESSION: 1. Posterior right parietal lobe metastasis measuring 3 mm. 2. Findings of chronic microvascular ischemia.     Electronically Signed   By: Lennette Bihari  [place] M.D.   On: 02/07/2021 22:10     Treatments: symptomatic treatment  Discharge Exam: Blood pressure (!) 146/72, pulse (!) 55, temperature 98.1 F (36.7 C),  resp. rate 16, height 5\' 7"  (1.702 m), weight 66.3 kg, SpO2 96 %. General appearance: alert, cooperative, and oriented to time place and person. Resp: clear to auscultation bilaterally Cardio: regular rate and rhythm, S1, S2 normal, no murmur, click, rub or gallop GI: soft, non-tender; bowel sounds normal; no masses,  no organomegaly Extremities: Distal right leg at right ankle area has significant redness, a blister with old blood.  The leg feels warm, tender to touch.  Disposition: Discharge disposition: 01-Home or Self Care       Discharge Instructions     Ambulatory referral to Hematology / Oncology   Complete by: As directed    Diet - low sodium heart healthy   Complete by: As directed    Increase activity slowly   Complete by: As directed       Allergies as of 02/08/2021       Reactions   Augmentin [amoxicillin-pot Clavulanate]    "messes up my blood cells"   Codeine Nausea Only   Levaquin [levofloxacin]    Affected lab results    Tape Rash   Some bandaids cause rash sometimes        Medication List     STOP taking these medications    cefdinir 300 MG capsule Commonly known as: OMNICEF   levofloxacin 750 MG tablet Commonly known as: Levaquin   lidocaine-prilocaine cream Commonly known as: EMLA   magic mouthwash Soln       TAKE these medications    acetaminophen 500 MG tablet Commonly known as: TYLENOL Take 500 mg by mouth every 6 (six) hours as needed for mild pain or moderate pain.   albuterol 108 (90 Base) MCG/ACT inhaler Commonly known as: VENTOLIN HFA Inhale 2 puffs into the lungs 2 (two) times daily.   amoxicillin-clavulanate 875-125 MG tablet Commonly known as: Augmentin Take 1 tablet by mouth 2 (two) times daily for 5 days.   atorvastatin 10 MG tablet Commonly known as: LIPITOR TAKE 1 TABLET BY MOUTH DAILY What changed: when to take this   dronabinol 2.5 MG capsule Commonly known as: Marinol Take 1 capsule (2.5 mg total) by  mouth 2 (two) times daily before a meal.   ketotifen 0.025 % ophthalmic solution Commonly known as: ZADITOR Place 1 drop into both eyes 2 (two) times daily as needed.   levothyroxine 50 MCG tablet Commonly known as: SYNTHROID Take 50 mcg by mouth daily before breakfast.   loratadine 10 MG tablet Commonly known as: CLARITIN Take 10 mg by mouth daily. Reported on 01/22/2016   LORazepam 0.5 MG tablet Commonly known as: ATIVAN Take 1 tablet (0.5 mg total) by mouth every 6 (six) hours as needed for anxiety.   losartan-hydrochlorothiazide 100-25 MG tablet Commonly known as: HYZAAR Take 1 tablet by mouth daily.   Magnesium Chloride-Calcium 64-106 MG Tbec Take 1 tablet by mouth daily.   metoprolol succinate 50 MG 24 hr tablet Commonly known as: TOPROL-XL Take 50 mg by mouth daily.   nystatin 100000 UNIT/ML suspension Commonly known as: MYCOSTATIN Take 5 mLs (500,000 Units total) by mouth 4 (four) times daily.   ondansetron 8 MG tablet Commonly known as: ZOFRAN Take 8 mg by mouth 2 (two) times daily as needed for nausea or vomiting.   PARoxetine 10 MG tablet Commonly known as: PAXIL Take 5 mg  by mouth every evening.   polyethylene glycol 17 g packet Commonly known as: MIRALAX / GLYCOLAX Take 17 g by mouth daily as needed.   potassium chloride SA 20 MEQ tablet Commonly known as: KLOR-CON Take 1 tablet (20 mEq total) by mouth daily.   prochlorperazine 10 MG tablet Commonly known as: COMPAZINE Take 10 mg by mouth as needed for nausea or vomiting.   QUEtiapine 25 MG tablet Commonly known as: SEROquel Take 1 tablet (25 mg total) by mouth at bedtime.   Slow-Mag 64 MG Tbec SR tablet Generic drug: magnesium chloride Take 1 tablet (64 mg total) by mouth daily.   sodium chloride 1 g tablet Take 1 tablet (1 g total) by mouth 3 (three) times daily.        Follow-up Information     Idelle Crouch, MD Follow up in 1 week(s).   Specialty: Internal Medicine Contact  information: [place] [address] [phone]                 Signed: Sharen Hones 02/08/2021, 10:02 AM

## 2021-02-09 ENCOUNTER — Other Ambulatory Visit: Payer: Self-pay

## 2021-02-09 ENCOUNTER — Encounter: Payer: Self-pay | Admitting: Hospice and Palliative Medicine

## 2021-02-09 ENCOUNTER — Inpatient Hospital Stay (HOSPITAL_BASED_OUTPATIENT_CLINIC_OR_DEPARTMENT_OTHER): Payer: Medicare Other | Admitting: Hospice and Palliative Medicine

## 2021-02-09 ENCOUNTER — Inpatient Hospital Stay: Payer: Medicare Other

## 2021-02-09 VITALS — BP 135/60 | HR 80 | Resp 18

## 2021-02-09 VITALS — BP 159/62 | HR 55 | Temp 96.2°F | Resp 20 | Wt 147.0 lb

## 2021-02-09 DIAGNOSIS — R531 Weakness: Secondary | ICD-10-CM | POA: Diagnosis not present

## 2021-02-09 DIAGNOSIS — C3490 Malignant neoplasm of unspecified part of unspecified bronchus or lung: Secondary | ICD-10-CM

## 2021-02-09 DIAGNOSIS — R600 Localized edema: Secondary | ICD-10-CM | POA: Diagnosis not present

## 2021-02-09 DIAGNOSIS — D649 Anemia, unspecified: Secondary | ICD-10-CM | POA: Diagnosis not present

## 2021-02-09 DIAGNOSIS — Z8249 Family history of ischemic heart disease and other diseases of the circulatory system: Secondary | ICD-10-CM | POA: Diagnosis not present

## 2021-02-09 DIAGNOSIS — Z79899 Other long term (current) drug therapy: Secondary | ICD-10-CM | POA: Diagnosis not present

## 2021-02-09 DIAGNOSIS — C7951 Secondary malignant neoplasm of bone: Secondary | ICD-10-CM | POA: Diagnosis not present

## 2021-02-09 DIAGNOSIS — C3432 Malignant neoplasm of lower lobe, left bronchus or lung: Secondary | ICD-10-CM | POA: Diagnosis present

## 2021-02-09 DIAGNOSIS — Z5111 Encounter for antineoplastic chemotherapy: Secondary | ICD-10-CM | POA: Diagnosis present

## 2021-02-09 DIAGNOSIS — G9340 Encephalopathy, unspecified: Secondary | ICD-10-CM | POA: Diagnosis not present

## 2021-02-09 DIAGNOSIS — C7931 Secondary malignant neoplasm of brain: Secondary | ICD-10-CM | POA: Diagnosis not present

## 2021-02-09 DIAGNOSIS — E871 Hypo-osmolality and hyponatremia: Secondary | ICD-10-CM | POA: Diagnosis not present

## 2021-02-09 DIAGNOSIS — Z5112 Encounter for antineoplastic immunotherapy: Secondary | ICD-10-CM | POA: Diagnosis not present

## 2021-02-09 DIAGNOSIS — Z8049 Family history of malignant neoplasm of other genital organs: Secondary | ICD-10-CM | POA: Diagnosis not present

## 2021-02-09 DIAGNOSIS — E860 Dehydration: Secondary | ICD-10-CM

## 2021-02-09 DIAGNOSIS — Z801 Family history of malignant neoplasm of trachea, bronchus and lung: Secondary | ICD-10-CM | POA: Diagnosis not present

## 2021-02-09 DIAGNOSIS — Z5189 Encounter for other specified aftercare: Secondary | ICD-10-CM | POA: Diagnosis not present

## 2021-02-09 DIAGNOSIS — L0390 Cellulitis, unspecified: Secondary | ICD-10-CM | POA: Diagnosis not present

## 2021-02-09 DIAGNOSIS — Z818 Family history of other mental and behavioral disorders: Secondary | ICD-10-CM | POA: Diagnosis not present

## 2021-02-09 LAB — CBC WITH DIFFERENTIAL/PLATELET
Abs Immature Granulocytes: 8.09 10*3/uL — ABNORMAL HIGH (ref 0.00–0.07)
Band Neutrophils: 8 %
Basophils Absolute: 0 10*3/uL (ref 0.0–0.1)
Basophils Relative: 0 %
Blasts: 0 %
Eosinophils Absolute: 0 10*3/uL (ref 0.0–0.5)
Eosinophils Relative: 0 %
HCT: 26.8 % — ABNORMAL LOW (ref 36.0–46.0)
Hemoglobin: 9.1 g/dL — ABNORMAL LOW (ref 12.0–15.0)
Lymphocytes Relative: 7 %
Lymphs Abs: 2.2 10*3/uL (ref 0.7–4.0)
MCH: 30.4 pg (ref 26.0–34.0)
MCHC: 34 g/dL (ref 30.0–36.0)
MCV: 89.6 fL (ref 80.0–100.0)
Metamyelocytes Relative: 10 %
Monocytes Absolute: 1.6 10*3/uL — ABNORMAL HIGH (ref 0.1–1.0)
Monocytes Relative: 5 %
Myelocytes: 16 %
Neutro Abs: 19.2 10*3/uL — ABNORMAL HIGH (ref 1.7–7.7)
Neutrophils Relative %: 54 %
Other: 0 %
Platelets: 148 10*3/uL — ABNORMAL LOW (ref 150–400)
Promyelocytes Relative: 0 %
RBC: 2.99 MIL/uL — ABNORMAL LOW (ref 3.87–5.11)
RDW: 17.8 % — ABNORMAL HIGH (ref 11.5–15.5)
Smear Review: ADEQUATE
WBC: 31.1 10*3/uL — ABNORMAL HIGH (ref 4.0–10.5)
nRBC: 0 /100 WBC
nRBC: 0.7 % — ABNORMAL HIGH (ref 0.0–0.2)

## 2021-02-09 LAB — COMPREHENSIVE METABOLIC PANEL
ALT: 15 U/L (ref 0–44)
AST: 21 U/L (ref 15–41)
Albumin: 2.9 g/dL — ABNORMAL LOW (ref 3.5–5.0)
Alkaline Phosphatase: 95 U/L (ref 38–126)
Anion gap: 9 (ref 5–15)
BUN: <5 mg/dL — ABNORMAL LOW (ref 8–23)
CO2: 26 mmol/L (ref 22–32)
Calcium: 8.6 mg/dL — ABNORMAL LOW (ref 8.9–10.3)
Chloride: 97 mmol/L — ABNORMAL LOW (ref 98–111)
Creatinine, Ser: 0.91 mg/dL (ref 0.44–1.00)
GFR, Estimated: >60 mL/min (ref >60)
Glucose, Bld: 78 mg/dL (ref 70–99)
Potassium: 3.6 mmol/L (ref 3.5–5.1)
Sodium: 132 mmol/L — ABNORMAL LOW (ref 135–145)
Total Bilirubin: 0.4 mg/dL (ref 0.3–1.2)
Total Protein: 5.6 g/dL — ABNORMAL LOW (ref 6.5–8.1)

## 2021-02-09 LAB — SAMPLE TO BLOOD BANK

## 2021-02-09 LAB — TSH: TSH: 2.779 u[IU]/mL (ref 0.350–4.500)

## 2021-02-09 MED ORDER — SODIUM CHLORIDE 0.9% FLUSH
10.0000 mL | INTRAVENOUS | Status: DC | PRN
  Administered 2021-02-09: 10 mL via INTRAVENOUS
  Filled 2021-02-09: qty 10

## 2021-02-09 MED ORDER — SODIUM CHLORIDE 1 G PO TABS: 1.0000 g | ORAL_TABLET | Freq: Three times a day (TID) | ORAL | 0 refills | Status: DC

## 2021-02-09 MED ORDER — MAGIC MOUTHWASH W/LIDOCAINE: 5.0000 mL | Freq: Four times a day (QID) | ORAL | 0 refills | Status: DC | PRN

## 2021-02-09 MED ORDER — SODIUM CHLORIDE 0.9 % IV SOLN
Freq: Once | INTRAVENOUS | Status: AC
  Filled 2021-02-09: qty 250

## 2021-02-09 MED ORDER — HEPARIN SOD (PORK) LOCK FLUSH 100 UNIT/ML IV SOLN
500.0000 [IU] | Freq: Once | INTRAVENOUS | Status: AC
  Administered 2021-02-09: 500 [IU] via INTRAVENOUS
  Filled 2021-02-09: qty 5

## 2021-02-09 NOTE — Progress Notes (Signed)
[facility]pt[facility]  [phone] [phone]  Patient Care Team: Idelle Crouch, MD as PCP - General (Internal Medicine) Telford Nab, RN as Oncology Nurse Navigator Earlie Server, MD as Consulting Physician (Hematology and Oncology)   Name of the patient: Beth Mcmillan  [id number]  [birth year]   Date of visit: 02/09/21  Reason for Consult:  Ms. 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  Patient was recently hospitalized 02/02/2021-02/05/2021 for suspected sepsis, although a clear source of infection was never identified.  She was again hospitalized 02/07/2021-02/08/2021 with altered mental status thought secondary to delirium from lack of sleep.  This resolved quickly with use of as needed Ativan.  Patient was also treated for right lower extremity cellulitis.  Patient was discharged home on Augmentin.  Patient presents to [facility] today for post hospital follow-up.  Since discharging home, patient denies any significant changes or concerns.  She continues to take Augmentin (started yesterday) and says that her right lower extremity remains edematous with a small open sore that is draining serous fluid.  Daughter states that she was able to express a small amount of pus from the wound margins.  Patient states that she originally developed the wound after hitting her leg against an object.  No fever or chills today.  No other symptomatic complaints.  Denies any neurologic complaints. Denies recent fevers or illnesses. Denies any easy bleeding or bruising. Reports poor appetite but denies weight loss. Denies chest pain. Denies any nausea, vomiting, constipation, or diarrhea. Denies urinary complaints. Patient offers no further specific complaints today.  PAST MEDICAL HISTORY: Past Medical  History:  Diagnosis Date   Anxiety    Breathing problem    low breathing function 53%   COPD (chronic obstructive pulmonary disease) (HCC)    EMPHYSEMA   Dysrhythmia    tachycardia   Emphysema of lung (HCC)    Hemorrhoid    History of hiatal hernia    SMALL- 2022   Hypercholesteremia    Hypertension    Hypothyroidism    Larynx polyp    Lung cancer (HCC)    Palpitations    with anxiety   Platelets decreased ([place]) 2020   Seasonal allergies    Tinnitus    Vertigo    no episodes in several years   Wears dentures    partial lower   White coat syndrome with hypertension     PAST SURGICAL HISTORY:  Past Surgical History:  Procedure Laterality Date   ABDOMINAL HYSTERECTOMY     partial   COLONOSCOPY WITH PROPOFOL N/A 01/22/2016   Procedure: COLONOSCOPY WITH PROPOFOL;  Surgeon: Lucilla Lame, MD;  Location: [facility];  Service: Endoscopy;  Laterality: N/A;   COLONOSCOPY WITH PROPOFOL N/A 04/12/2019   Procedure: COLONOSCOPY WITH BIOPSY;  Surgeon: Lucilla Lame, MD;  Location: [facility];  Service: Endoscopy;  Laterality: N/A;  pt would like an early appt   ct guided lung bx     IR IMAGING GUIDED PORT INSERTION  12/18/2020   POLYPECTOMY  01/22/2016   Procedure: POLYPECTOMY;  Surgeon: Lucilla Lame, MD;  Location: [facility];  Service: Endoscopy;;   POLYPECTOMY N/A 04/12/2019   Procedure: POLYPECTOMY;  Surgeon: Lucilla Lame, MD;  Location: [facility];  Service: Endoscopy;  Laterality: N/A;   VIDEO BRONCHOSCOPY WITH ENDOBRONCHIAL NAVIGATION N/A 11/21/2020  Procedure: VIDEO BRONCHOSCOPY WITH ENDOBRONCHIAL NAVIGATION;  Surgeon: Ottie Glazier, MD;  Location: [facility];  Service: Thoracic;  Laterality: N/A;   VIDEO BRONCHOSCOPY WITH ENDOBRONCHIAL ULTRASOUND N/A 11/21/2020   Procedure: VIDEO BRONCHOSCOPY WITH ENDOBRONCHIAL ULTRASOUND;  Surgeon: Ottie Glazier, MD;  Location: [facility];  Service: Thoracic;  Laterality: N/A;    HEMATOLOGY/ONCOLOGY HISTORY:   Oncology History  Small cell lung cancer in adult ([place])  12/13/2020 Initial Diagnosis   Small cell lung cancer in adult [facility])    12/22/2020 -  Chemotherapy    Patient is on Treatment Plan: LUNG SCLC CARBOPLATIN + ETOPOSIDE + ATEZOLIZUMAB INDUCTION Q21D / ATEZOLIZUMAB MAINTENANCE Q21D       12/22/2020 Cancer Staging   Staging form: Lung, AJCC 8th Edition - Clinical stage from 12/22/2020: Stage IV (cT2, cN0, cM1) - Signed by Sindy Guadeloupe, MD on 12/22/2020      ALLERGIES:  is allergic to augmentin [amoxicillin-pot clavulanate], codeine, levaquin [levofloxacin], and tape.  MEDICATIONS:  Current Outpatient Medications  Medication Sig Dispense Refill   acetaminophen (TYLENOL) 500 MG tablet Take 500 mg by mouth every 6 (six) hours as needed for mild pain or moderate pain.     albuterol (VENTOLIN HFA) 108 (90 Base) MCG/ACT inhaler Inhale 2 puffs into the lungs 2 (two) times daily.     amoxicillin-clavulanate (AUGMENTIN) 875-125 MG tablet Take 1 tablet by mouth 2 (two) times daily for 5 days. 10 tablet 0   atorvastatin (LIPITOR) 10 MG tablet TAKE 1 TABLET BY MOUTH DAILY 90 tablet 0   dronabinol (MARINOL) 2.5 MG capsule Take 1 capsule (2.5 mg total) by mouth 2 (two) times daily before a meal. 14 capsule 0   ketotifen (ZADITOR) 0.025 % ophthalmic solution Place 1 drop into both eyes 2 (two) times daily as needed.     levothyroxine (SYNTHROID, LEVOTHROID) 50 MCG tablet Take 50 mcg by mouth daily before breakfast.     loratadine (CLARITIN) 10 MG tablet Take 10 mg by mouth daily. Reported on 01/22/2016     LORazepam (ATIVAN) 0.5 MG tablet Take 1 tablet (0.5 mg total) by mouth every 6 (six) hours as needed for anxiety. 120 tablet 0   losartan-hydrochlorothiazide (HYZAAR) 100-25 MG tablet Take 1 tablet by mouth daily.     magnesium chloride (SLOW-MAG) 64 MG TBEC SR tablet Take 1 tablet (64 mg total) by mouth daily. 30 tablet 0   Magnesium Chloride-Calcium 64-106 MG TBEC Take 1 tablet by mouth daily. 14  tablet 0   metoprolol succinate (TOPROL-XL) 50 MG 24 hr tablet Take 50 mg by mouth daily.     nicotine (NICODERM CQ - DOSED IN MG/24 HOURS) 14 mg/24hr patch Place 1 patch (14 mg total) onto the skin daily. 28 patch 0   nystatin (MYCOSTATIN) 100000 UNIT/ML suspension Take 5 mLs (500,000 Units total) by mouth 4 (four) times daily. 60 mL 0   ondansetron (ZOFRAN) 8 MG tablet Take 8 mg by mouth 2 (two) times daily as needed for nausea or vomiting.     PARoxetine (PAXIL) 10 MG tablet Take 5 mg by mouth every evening.     polyethylene glycol (MIRALAX / GLYCOLAX) 17 g packet Take 17 g by mouth daily as needed.     potassium chloride SA (KLOR-CON) 20 MEQ tablet Take 1 tablet (20 mEq total) by mouth daily. 28 tablet 0   prochlorperazine (COMPAZINE) 10 MG tablet Take 10 mg by mouth as needed for nausea or vomiting.     QUEtiapine (SEROQUEL) 25 MG tablet Take  1 tablet (25 mg total) by mouth at bedtime. 14 tablet 0   sodium chloride 1 g tablet Take 1 tablet (1 g total) by mouth 3 (three) times daily. 42 tablet 0   No current facility-administered medications for this visit.    VITAL SIGNS: There were no vitals taken for this visit. There were no vitals filed for this visit.  Estimated body mass index is 22.88 kg/m as calculated from the following:   Height as of 02/07/21: 5\' 7"  (1.702 m).   Weight as of 02/07/21: 146 lb 0.9 oz (66.3 kg).  LABS: CBC:    Component Value Date/Time   WBC 27.1 (H) 02/08/2021 0554   HGB 8.0 (L) 02/08/2021 0554   HGB 13.8 06/05/2013 1127   HCT 23.2 (L) 02/08/2021 0554   HCT 40.0 06/05/2013 1127   PLT 114 (L) 02/08/2021 0554   PLT 303 06/05/2013 1127   MCV 87.5 02/08/2021 0554   MCV 92 06/05/2013 1127   NEUTROABS 20.4 (H) 02/07/2021 1102   LYMPHSABS 2.0 02/07/2021 1102   MONOABS 3.2 (H) 02/07/2021 1102   EOSABS 0.0 02/07/2021 1102   BASOSABS 0.0 02/07/2021 1102   Comprehensive Metabolic Panel:    Component Value Date/Time   NA 132 (L) 02/08/2021 0554   NA 131  (L) 06/05/2013 1127   K 3.6 02/08/2021 0554   K 3.0 (L) 06/05/2013 1127   CL 105 02/08/2021 0554   CL 96 (L) 06/05/2013 1127   CO2 23 02/08/2021 0554   CO2 26 06/05/2013 1127   BUN <5 (L) 02/08/2021 0554   BUN 10 06/05/2013 1127   CREATININE 0.74 02/08/2021 0554   CREATININE 0.94 06/05/2013 1127   GLUCOSE 97 02/08/2021 0554   GLUCOSE 127 (H) 06/05/2013 1127   CALCIUM 7.7 (L) 02/08/2021 0554   CALCIUM 9.1 06/05/2013 1127   AST 18 02/02/2021 2323   ALT 17 02/02/2021 2323   ALKPHOS 77 02/02/2021 2323   BILITOT 0.8 02/02/2021 2323   PROT 6.0 (L) 02/02/2021 2323   ALBUMIN 3.2 (L) 02/02/2021 2323    RADIOGRAPHIC STUDIES: DG Chest 2 View  Result Date: 02/07/2021 CLINICAL DATA:  Sepsis, COPD EXAM: CHEST - 2 VIEW COMPARISON:  02/02/2021, 11/19/2020 FINDINGS: Right-sided chest port terminates at the level of the superior cavoatrial junction. Heart size is normal. Atherosclerotic calcification of the aortic knob. 1.5 cm left lower lobe lung nodule, similar in size to the recent prior chest x-ray. Lungs are hyperinflated and appear otherwise clear. No pleural effusion or pneumothorax. IMPRESSION: No acute cardiopulmonary abnormality. Electronically Signed   By: Davina Poke D.O.   On: 02/07/2021 13:32   DG Chest 2 View  Result Date: 01/28/2021 CLINICAL DATA:  Weakness. EXAM: CHEST - 2 VIEW COMPARISON:  None. FINDINGS: A right-sided venous Port-A-Cath is seen with its distal tip noted at the junction of the superior vena cava and right atrium. The lungs are hyperinflated. A 1.3 cm x 1.5 cm opacity is seen overlying the left lung base on the frontal view. Mild left basilar linear scarring and/or atelectasis is noted. The heart size and mediastinal contours are within normal limits. The visualized skeletal structures are unremarkable. IMPRESSION: 1. Mild left basilar linear scarring and/or atelectasis. 2. Focal opacity overlying the left lung base which may represent an overlying nipple shadow  versus residual left basilar lung mass. Electronically Signed   By: Virgina Norfolk M.D.   On: 01/28/2021 21:37   CT Head Wo Contrast  Result Date: 02/07/2021 CLINICAL DATA:  Patient with  confusion. EXAM: CT HEAD WITHOUT CONTRAST TECHNIQUE: Contiguous axial images were obtained from the base of the skull through the vertex without intravenous contrast. COMPARISON:  Brain CT 02/03/2021. FINDINGS: Brain: Ventricles and sulci are appropriate for patient's age. No evidence for acute cortically based infarct, intracranial hemorrhage, mass lesion or mass-effect. Vascular: Unremarkable. Skull: Intact. Sinuses/Orbits: Paranasal sinuses well aerated. Mastoid air cells are unremarkable. Orbits are unremarkable. Other: None. IMPRESSION: No acute intracranial process. Electronically Signed   By: Lovey Newcomer M.D.   On: 02/07/2021 13:18   CT Head W or Wo Contrast  Result Date: 02/03/2021 CLINICAL DATA:  Encephalopathy. EXAM: CT HEAD WITHOUT AND WITH CONTRAST TECHNIQUE: Contiguous axial images were obtained from the base of the skull through the vertex without and with intravenous contrast CONTRAST:  21mL OMNIPAQUE IOHEXOL 300 MG/ML  SOLN COMPARISON:  06/05/2013 FINDINGS: Brain: There is no mass, hemorrhage or extra-axial collection. The size and configuration of the ventricles and extra-axial CSF spaces are normal. The brain parenchyma is normal, without acute or chronic infarction. No abnormal contrast enhancement. Vascular: No abnormal hyperdensity of the major intracranial arteries or dural venous sinuses. No intracranial atherosclerosis. Skull: The visualized skull base, calvarium and extracranial soft tissues are normal. Sinuses/Orbits: No fluid levels or advanced mucosal thickening of the visualized paranasal sinuses. No mastoid or middle ear effusion. The orbits are normal. IMPRESSION: Normal head CT. Electronically Signed   By: Ulyses Jarred M.D.   On: 02/03/2021 01:05   MR BRAIN W WO CONTRAST  Result Date:  02/07/2021 CLINICAL DATA:  Encephalopathy. History of small cell lung carcinoma with metastases to bone. EXAM: MRI HEAD WITHOUT AND WITH CONTRAST TECHNIQUE: Multiplanar, multiecho pulse sequences of the brain and surrounding structures were obtained without and with intravenous contrast. CONTRAST:  7.62mL GADAVIST GADOBUTROL 1 MMOL/ML IV SOLN COMPARISON:  12/24/2020 FINDINGS: Brain: No acute infarct, mass effect or extra-axial collection. Single focus of high right convexity chronic microhemorrhage. There is multifocal hyperintense T2-weighted signal within the white matter. Parenchymal volume and CSF spaces are normal. The midline structures are normal. Punctate contrast enhancing lesion in the posterior right parietal lobe measuring 3 mm (series 21, image 9). No other contrast-enhancing lesions. Vascular: Major flow voids are preserved. Skull and upper cervical spine: Normal calvarium and skull base. Visualized upper cervical spine and soft tissues are normal. Sinuses/Orbits:No paranasal sinus fluid levels or advanced mucosal thickening. No mastoid or middle ear effusion. Normal orbits. IMPRESSION: 1. Posterior right parietal lobe metastasis measuring 3 mm. 2. Findings of chronic microvascular ischemia. Electronically Signed   By: Ulyses Jarred M.D.   On: 02/07/2021 22:10   US Venous Img Lower Bilateral (DVT)  Result Date: 02/03/2021 CLINICAL DATA:  75 year old female with a history bilateral lower extremity pain and swelling EXAM: BILATERAL LOWER EXTREMITY VENOUS DOPPLER ULTRASOUND TECHNIQUE: Gray-scale sonography with graded compression, as well as color Doppler and duplex ultrasound were performed to evaluate the lower extremity deep venous systems from the level of the common femoral vein and including the common femoral, femoral, profunda femoral, popliteal and calf veins including the posterior tibial, peroneal and gastrocnemius veins when visible. The superficial great saphenous vein was also  interrogated. Spectral Doppler was utilized to evaluate flow at rest and with distal augmentation maneuvers in the common femoral, femoral and popliteal veins. COMPARISON:  None. FINDINGS: RIGHT LOWER EXTREMITY Common Femoral Vein: No evidence of thrombus. Normal compressibility, respiratory phasicity and response to augmentation. Saphenofemoral Junction: No evidence of thrombus. Normal compressibility and flow on color Doppler  imaging. Profunda Femoral Vein: No evidence of thrombus. Normal compressibility and flow on color Doppler imaging. Femoral Vein: No evidence of thrombus. Normal compressibility, respiratory phasicity and response to augmentation. Popliteal Vein: No evidence of thrombus. Normal compressibility, respiratory phasicity and response to augmentation. Calf Veins: No evidence of thrombus. Normal compressibility and flow on color Doppler imaging. Superficial Great Saphenous Vein: No evidence of thrombus. Normal compressibility and flow on color Doppler imaging. Other Findings:  None. LEFT LOWER EXTREMITY Common Femoral Vein: No evidence of thrombus. Normal compressibility, respiratory phasicity and response to augmentation. Saphenofemoral Junction: No evidence of thrombus. Normal compressibility and flow on color Doppler imaging. Profunda Femoral Vein: No evidence of thrombus. Normal compressibility and flow on color Doppler imaging. Femoral Vein: No evidence of thrombus. Normal compressibility, respiratory phasicity and response to augmentation. Popliteal Vein: No evidence of thrombus. Normal compressibility, respiratory phasicity and response to augmentation. Calf Veins: No evidence of thrombus. Normal compressibility and flow on color Doppler imaging. Superficial Great Saphenous Vein: No evidence of thrombus. Normal compressibility and flow on color Doppler imaging. Other Findings:  None. IMPRESSION: Sonographic survey of the bilateral lower extremities negative for DVT Electronically Signed   By:  Corrie Mckusick D.O.   On: 02/03/2021 11:38   DG Chest Port 1 View  Result Date: 02/02/2021 CLINICAL DATA:  Questionable sepsis EXAM: PORTABLE CHEST 1 VIEW COMPARISON:  01/28/2021 FINDINGS: Right Port-A-Cath remains in place, unchanged. There is hyperinflation of the lungs compatible with COPD. Heart is normal size. Aortic calcifications. No confluent opacities or effusions. No acute bony abnormality. IMPRESSION: COPD.  No active disease. Electronically Signed   By: Rolm Baptise M.D.   On: 02/02/2021 23:33    PERFORMANCE STATUS (ECOG) : 2 - Symptomatic, <50% confined to bed  Review of Systems Unless otherwise noted, a complete review of systems is negative.  Physical Exam General: NAD HEENT: No oral sores or mucositis Cardiovascular: regular rate and rhythm Pulmonary: clear ant/posterior fields Abdomen: soft, nontender, + bowel sounds GU: no suprapubic tenderness Extremities: Right lower extremity edema, no joint deformities Skin: Small skin tear to right lower extremity with surrounding erythema and swelling.  See image Neurological: Weakness but otherwise nonfocal     Assessment and Plan- Patient is a 75 y.o. 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  Cellulitis -patient on Augmentin for 5 days.  May need to extend course of antibiotics but will monitor for healing.  Discussed basic wound care with patient/daughter.  Clean wound bed with saline.  Okay to apply bacitracin and keep covered with gauze.    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  Sodium was improving with fluids in the hospital.  Patient is interested in receiving IV fluids today.  We will give 1 L normal saline x1.  Continue sodium chloride supplementation and will  refill today at patient's request.   Poor oral intake -referral to nutritionist.  Recommend oral nutritional supplements daily.  Patient requests refill of Magic mouthwash, although she does not appear to have active mucositis.  Will send Rx.  Case and plan discussed with Dr. Janese Banks  Patient expressed understanding and was in agreement with this plan. She also understands that She can call clinic at any time with any questions, concerns, or complaints.   Thank you for allowing me to  participate in the care of this very pleasant patient.   Time Total: 45 minutes  Visit consisted of counseling and education dealing with the complex and emotionally intense issues of symptom management and palliative care in the setting of serious and potentially life-threatening illness.Greater than 50%  of this time was spent counseling and coordinating care related to the above assessment and plan.  Signed by: Altha Harm, PhD, NP-C

## 2021-02-09 NOTE — Progress Notes (Signed)
Patient received only IV hydration today. Tolerated well. Discharged to home.

## 2021-02-09 NOTE — Patient Instructions (Signed)
[facility]  Discharge Instructions: Thank you for choosing [facility] to provide your oncology and hematology care.  If you have a lab appointment with the [facility], please go directly to the [facility] and check in at the registration area.  Wear comfortable clothing and clothing appropriate for easy access to any Portacath or PICC line.   We strive to give you quality time with your provider. You may need to reschedule your appointment if you arrive late (15 or more minutes).  Arriving late affects you and other patients whose appointments are after yours.  Also, if you miss three or more appointments without notifying the office, you may be dismissed from the clinic at the provider's discretion.      For prescription refill requests, have your pharmacy contact our office and allow 72 hours for refills to be completed.    Today you received the following chemotherapy and/or immunotherapy agents       To help prevent nausea and vomiting after your treatment, we encourage you to take your nausea medication as directed.  BELOW ARE SYMPTOMS THAT SHOULD BE REPORTED IMMEDIATELY: *FEVER GREATER THAN 100.4 F (38 C) OR HIGHER *CHILLS OR SWEATING *NAUSEA AND VOMITING THAT IS NOT CONTROLLED WITH YOUR NAUSEA MEDICATION *UNUSUAL SHORTNESS OF BREATH *UNUSUAL BRUISING OR BLEEDING *URINARY PROBLEMS (pain or burning when urinating, or frequent urination) *BOWEL PROBLEMS (unusual diarrhea, constipation, pain near the anus) TENDERNESS IN MOUTH AND THROAT WITH OR WITHOUT PRESENCE OF ULCERS (sore throat, sores in mouth, or a toothache) UNUSUAL RASH, SWELLING OR PAIN  UNUSUAL VAGINAL DISCHARGE OR ITCHING   Items with * indicate a potential emergency and should be followed up as soon as possible or go to the Emergency Department if any problems should occur.  Please show the CHEMOTHERAPY ALERT CARD or IMMUNOTHERAPY ALERT CARD at check-in to the  Emergency Department and triage nurse.  Should you have questions after your visit or need to cancel or reschedule your appointment, please contact [facility]  [phone] and follow the prompts.  Office hours are 8:00 a.m. to 4:30 p.m. Monday - Friday. Please note that voicemails left after 4:00 p.m. may not be returned until the following business day.  We are closed weekends and major holidays. You have access to a nurse at all times for urgent questions. Please call the main number to the clinic [phone] and follow the prompts.  For any non-urgent questions, you may also contact your provider using MyChart. We now offer e-Visits for anyone 44 and older to request care online for non-urgent symptoms. For details visit [email or web address].   Also download the MyChart app! Go to the app store, search "MyChart", open the app, select [facility], and log in with your MyChart username and password.  Due to Covid, a mask is required upon entering the hospital/clinic. If you do not have a mask, one will be given to you upon arrival. For doctor visits, patients may have 1 support person aged 40 or older with them. For treatment visits, patients cannot have anyone with them due to current Covid guidelines and our immunocompromised population.

## 2021-02-10 ENCOUNTER — Inpatient Hospital Stay: Payer: Medicare Other | Admitting: Oncology

## 2021-02-10 ENCOUNTER — Inpatient Hospital Stay: Payer: Medicare Other

## 2021-02-10 ENCOUNTER — Telehealth: Payer: Self-pay | Admitting: *Deleted

## 2021-02-10 ENCOUNTER — Encounter: Payer: Self-pay | Admitting: Oncology

## 2021-02-10 LAB — T4: T4, Total: 6.1 ug/dL (ref 4.5–12.0)

## 2021-02-10 NOTE — Telephone Encounter (Signed)
Probably due to antibiotics. Recommend otc imodium 2 mg q2 hours prn max 16 mg/day. If diarrhea no better in24-48 hours or patient feels weaker, call us

## 2021-02-10 NOTE — Telephone Encounter (Signed)
Called reporting that patient is now having loose stools, not water, but very loose causing incontinence as patient cannot get to bathroom quick enough. Asking if this is from all the antibiotics she has been on or is this is something else. Please advise

## 2021-02-11 ENCOUNTER — Other Ambulatory Visit: Payer: Self-pay | Admitting: *Deleted

## 2021-02-11 ENCOUNTER — Encounter: Payer: Self-pay | Admitting: Radiation Oncology

## 2021-02-11 ENCOUNTER — Encounter: Payer: Self-pay | Admitting: Oncology

## 2021-02-11 ENCOUNTER — Inpatient Hospital Stay: Payer: Medicare Other

## 2021-02-11 ENCOUNTER — Other Ambulatory Visit: Payer: Self-pay

## 2021-02-11 ENCOUNTER — Ambulatory Visit
Admission: RE | Admit: 2021-02-11 | Discharge: 2021-02-11 | Disposition: A | Discharge status: Home / Self Care | Payer: Medicare Other | Source: Ambulatory Visit | Attending: Radiation Oncology | Admitting: Radiation Oncology

## 2021-02-11 ENCOUNTER — Ambulatory Visit: Payer: Medicare Other | Admitting: Radiation Oncology

## 2021-02-11 ENCOUNTER — Other Ambulatory Visit: Payer: Self-pay | Admitting: Oncology

## 2021-02-11 VITALS — BP 167/77 | HR 85 | Temp 97.7°F | Resp 18 | Wt 147.2 lb

## 2021-02-11 DIAGNOSIS — C7931 Secondary malignant neoplasm of brain: Secondary | ICD-10-CM

## 2021-02-11 DIAGNOSIS — Z923 Personal history of irradiation: Secondary | ICD-10-CM | POA: Diagnosis not present

## 2021-02-11 DIAGNOSIS — C7951 Secondary malignant neoplasm of bone: Secondary | ICD-10-CM | POA: Insufficient documentation

## 2021-02-11 DIAGNOSIS — C3490 Malignant neoplasm of unspecified part of unspecified bronchus or lung: Secondary | ICD-10-CM | POA: Insufficient documentation

## 2021-02-11 MED ORDER — MAGIC MOUTHWASH W/LIDOCAINE: 5.0000 mL | Freq: Four times a day (QID) | ORAL | 0 refills | Status: DC | PRN

## 2021-02-11 NOTE — Progress Notes (Signed)
Radiation Oncology Follow up Note  Name: Beth Mcmillan   Date:   02/11/2021 MRN:  [id number] DOB: 11/03/1945    This 75 y.o. female presents to the clinic today for 1 month follow-up status post palliative radiation therapy to both brain and hip for stage IV small cell lung cancer.  REFERRING PROVIDER: Idelle Crouch, MD  HPI: Patient is a 75 year old female now out 1 month having completed palliative radiation therapy to her whole brain as well as right hip for extensive stage small cell lung cancer seen today in routine follow-up she is doing fairly well she states she feels great.  Pain in the right hip has resolved.  She is having no neurologic complaints at this time recently was hospitalized for delirium was found to have sepsis.  Seems to be originating from lower extremity cellulitis for which she is currently on antibiotic therapy for.  She specifically denies cough hemoptysis or chest tightness..  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  COMPLICATIONS OF TREATMENT: none  FOLLOW UP COMPLIANCE: keeps appointments   PHYSICAL EXAM:  BP (!) 167/77 (BP Location: Left Arm, Patient Position: Sitting)   Pulse 85   Temp 97.7 F (36.5 C) (Tympanic)   Resp 18   Wt 147 lb 3.2 oz (66.8 kg)   BMI 23.05 kg/m  Crude visual fields are within normal range range of motion of lower extremities does not elicit pain.  She does have active cellulitis in the right lower extremity.  Well-developed well-nourished patient in NAD. HEENT reveals PERLA, EOMI, discs not visualized.  Oral cavity is clear. No oral mucosal lesions are identified. Neck is clear without evidence of cervical or supraclavicular adenopathy. Lungs are clear to A&P. Cardiac examination is essentially unremarkable with regular rate and rhythm without murmur rub or thrill. Abdomen is benign with no organomegaly or masses noted. Motor sensory and DTR levels are equal and  symmetric in the upper and lower extremities. Cranial nerves II through XII are grossly intact. Proprioception is intact. No peripheral adenopathy or edema is identified. No motor or sensory levels are noted. Crude visual fields are within normal range.  RADIOLOGY RESULTS: MRI of brain reviewed compatible with above-stated findings  PLAN: At this time we will continue to observe certainly not concerned about a 3 mm lesion in her brain as this was recently radiated.  I am turning follow-up care over to medical oncology.  I would happy to reevaluate the patient anytime should further palliative treatment be indicated.  Patient and daughter know to call with any concerns.  I would like to take this opportunity to thank you for allowing me to participate in the care of your patient.Noreene Filbert, MD

## 2021-02-11 NOTE — Telephone Encounter (Signed)
I spoke with Ms Beth Mcmillan and advised her of doctor reply. She will give Imodium to patient not to exceed 8 tabs per day and let us know if she does not improve

## 2021-02-11 NOTE — Telephone Encounter (Signed)
I attempted to return cal, but a voice mail with message that voice mail was full. Will try again later

## 2021-02-12 ENCOUNTER — Ambulatory Visit: Payer: Medicare Other

## 2021-02-12 LAB — VITAMIN B1: Vitamin B1 (Thiamine): 99.1 nmol/L (ref 66.5–200.0)

## 2021-02-12 LAB — CULTURE, BLOOD (ROUTINE X 2)
Culture: NO GROWTH
Culture: NO GROWTH
Special Requests: ADEQUATE
Special Requests: ADEQUATE

## 2021-02-16 ENCOUNTER — Other Ambulatory Visit: Payer: Self-pay | Admitting: *Deleted

## 2021-02-16 MED ORDER — POTASSIUM CHLORIDE CRYS ER 20 MEQ PO TBCR
20.0000 meq | EXTENDED_RELEASE_TABLET | Freq: Every day | ORAL | 0 refills | Status: DC
Start: 2021-02-16 — End: 2021-03-16

## 2021-02-17 ENCOUNTER — Inpatient Hospital Stay: Payer: Medicare Other

## 2021-02-17 ENCOUNTER — Encounter: Payer: Self-pay | Admitting: Oncology

## 2021-02-17 ENCOUNTER — Encounter: Payer: Self-pay | Admitting: *Deleted

## 2021-02-17 ENCOUNTER — Other Ambulatory Visit: Payer: Self-pay

## 2021-02-17 ENCOUNTER — Inpatient Hospital Stay (HOSPITAL_BASED_OUTPATIENT_CLINIC_OR_DEPARTMENT_OTHER): Payer: Medicare Other | Admitting: Oncology

## 2021-02-17 VITALS — BP 158/73 | HR 86 | Temp 97.0°F | Resp 18 | Wt 140.5 lb

## 2021-02-17 DIAGNOSIS — Z5111 Encounter for antineoplastic chemotherapy: Secondary | ICD-10-CM

## 2021-02-17 DIAGNOSIS — Z5112 Encounter for antineoplastic immunotherapy: Secondary | ICD-10-CM | POA: Diagnosis not present

## 2021-02-17 DIAGNOSIS — E860 Dehydration: Secondary | ICD-10-CM

## 2021-02-17 DIAGNOSIS — C3490 Malignant neoplasm of unspecified part of unspecified bronchus or lung: Secondary | ICD-10-CM

## 2021-02-17 LAB — COMPREHENSIVE METABOLIC PANEL
ALT: 13 U/L (ref 0–44)
AST: 20 U/L (ref 15–41)
Albumin: 3.1 g/dL — ABNORMAL LOW (ref 3.5–5.0)
Alkaline Phosphatase: 61 U/L (ref 38–126)
Anion gap: 7 (ref 5–15)
BUN: 11 mg/dL (ref 8–23)
CO2: 26 mmol/L (ref 22–32)
Calcium: 8.7 mg/dL — ABNORMAL LOW (ref 8.9–10.3)
Chloride: 94 mmol/L — ABNORMAL LOW (ref 98–111)
Creatinine, Ser: 0.97 mg/dL (ref 0.44–1.00)
GFR, Estimated: >60 mL/min (ref >60)
Glucose, Bld: 120 mg/dL — ABNORMAL HIGH (ref 70–99)
Potassium: 3.4 mmol/L — ABNORMAL LOW (ref 3.5–5.1)
Sodium: 127 mmol/L — ABNORMAL LOW (ref 135–145)
Total Bilirubin: 0.7 mg/dL (ref 0.3–1.2)
Total Protein: 6 g/dL — ABNORMAL LOW (ref 6.5–8.1)

## 2021-02-17 LAB — CBC WITH DIFFERENTIAL/PLATELET
Abs Immature Granulocytes: 0.23 10*3/uL — ABNORMAL HIGH (ref 0.00–0.07)
Basophils Absolute: 0.1 10*3/uL (ref 0.0–0.1)
Basophils Relative: 1 %
Eosinophils Absolute: 0 10*3/uL (ref 0.0–0.5)
Eosinophils Relative: 0 %
HCT: 28.8 % — ABNORMAL LOW (ref 36.0–46.0)
Hemoglobin: 9.3 g/dL — ABNORMAL LOW (ref 12.0–15.0)
Immature Granulocytes: 4 %
Lymphocytes Relative: 18 %
Lymphs Abs: 1.2 10*3/uL (ref 0.7–4.0)
MCH: 30.7 pg (ref 26.0–34.0)
MCHC: 32.3 g/dL (ref 30.0–36.0)
MCV: 95 fL (ref 80.0–100.0)
Monocytes Absolute: 0.9 10*3/uL (ref 0.1–1.0)
Monocytes Relative: 13 %
Neutro Abs: 4.2 10*3/uL (ref 1.7–7.7)
Neutrophils Relative %: 64 %
Platelets: 212 10*3/uL (ref 150–400)
RBC: 3.03 MIL/uL — ABNORMAL LOW (ref 3.87–5.11)
RDW: 19.5 % — ABNORMAL HIGH (ref 11.5–15.5)
WBC: 6.5 10*3/uL (ref 4.0–10.5)
nRBC: 0 % (ref 0.0–0.2)

## 2021-02-17 LAB — TSH: TSH: 1.706 u[IU]/mL (ref 0.350–4.500)

## 2021-02-17 MED ORDER — HEPARIN SOD (PORK) LOCK FLUSH 100 UNIT/ML IV SOLN
INTRAVENOUS | Status: AC
  Filled 2021-02-17: qty 5

## 2021-02-17 MED ORDER — SODIUM CHLORIDE 0.9 % IV SOLN
Freq: Once | INTRAVENOUS | Status: AC
Start: 2021-02-17 — End: 2021-02-17
  Filled 2021-02-17: qty 250

## 2021-02-17 MED ORDER — SODIUM CHLORIDE 0.9 % IV SOLN
1200.0000 mg | Freq: Once | INTRAVENOUS | Status: AC
  Administered 2021-02-17: 1200 mg via INTRAVENOUS
  Filled 2021-02-17: qty 20

## 2021-02-17 MED ORDER — PALONOSETRON HCL INJECTION 0.25 MG/5ML
0.2500 mg | Freq: Once | INTRAVENOUS | Status: AC
  Administered 2021-02-17: 0.25 mg via INTRAVENOUS
  Filled 2021-02-17: qty 5

## 2021-02-17 MED ORDER — SODIUM CHLORIDE 0.9 % IV SOLN
306.8000 mg | Freq: Once | INTRAVENOUS | Status: AC
  Administered 2021-02-17: 310 mg via INTRAVENOUS
  Filled 2021-02-17: qty 31

## 2021-02-17 MED ORDER — SODIUM CHLORIDE 0.9 % IV SOLN
Freq: Once | INTRAVENOUS | Status: DC
  Filled 2021-02-17: qty 250

## 2021-02-17 MED ORDER — SODIUM CHLORIDE 0.9 % IV SOLN
150.0000 mg | Freq: Once | INTRAVENOUS | Status: AC
  Administered 2021-02-17: 150 mg via INTRAVENOUS
  Filled 2021-02-17: qty 150

## 2021-02-17 MED ORDER — HEPARIN SOD (PORK) LOCK FLUSH 100 UNIT/ML IV SOLN
500.0000 [IU] | Freq: Once | INTRAVENOUS | Status: AC
  Administered 2021-02-17: 500 [IU] via INTRAVENOUS
  Filled 2021-02-17: qty 5

## 2021-02-17 MED ORDER — SODIUM CHLORIDE 0.9 % IV SOLN
10.0000 mg | Freq: Once | INTRAVENOUS | Status: AC
  Administered 2021-02-17: 10 mg via INTRAVENOUS
  Filled 2021-02-17: qty 10

## 2021-02-17 MED ORDER — SODIUM CHLORIDE 0.9 % IV SOLN
100.0000 mg/m2 | Freq: Once | INTRAVENOUS | Status: AC
  Administered 2021-02-17: 180 mg via INTRAVENOUS
  Filled 2021-02-17: qty 9

## 2021-02-17 MED ORDER — HEPARIN SOD (PORK) LOCK FLUSH 100 UNIT/ML IV SOLN
500.0000 [IU] | Freq: Once | INTRAVENOUS | Status: AC | PRN
  Administered 2021-02-17: 500 [IU]
  Filled 2021-02-17: qty 5

## 2021-02-17 MED ORDER — SODIUM CHLORIDE 0.9% FLUSH
10.0000 mL | Freq: Once | INTRAVENOUS | Status: AC
  Administered 2021-02-17: 10 mL via INTRAVENOUS
  Filled 2021-02-17: qty 10

## 2021-02-17 NOTE — Progress Notes (Signed)
Small amount of soft stool yest and day before. Took imodium this am for first time. No abd pain or nausea.

## 2021-02-17 NOTE — Patient Instructions (Signed)
[facility]  Discharge Instructions: Thank you for choosing [facility] to provide your oncology and hematology care.  If you have a lab appointment with the [facility], please go directly to the [facility] and check in at the registration area.  Wear comfortable clothing and clothing appropriate for easy access to any Portacath or PICC line.   We strive to give you quality time with your provider. You may need to reschedule your appointment if you arrive late (15 or more minutes).  Arriving late affects you and other patients whose appointments are after yours.  Also, if you miss three or more appointments without notifying the office, you may be dismissed from the clinic at the provider's discretion.      For prescription refill requests, have your pharmacy contact our office and allow 72 hours for refills to be completed.    Today you received the following chemotherapy and/or immunotherapy agents Tecentriq, Carboplatin, Etoposide      To help prevent nausea and vomiting after your treatment, we encourage you to take your nausea medication as directed.  BELOW ARE SYMPTOMS THAT SHOULD BE REPORTED IMMEDIATELY: *FEVER GREATER THAN 100.4 F (38 C) OR HIGHER *CHILLS OR SWEATING *NAUSEA AND VOMITING THAT IS NOT CONTROLLED WITH YOUR NAUSEA MEDICATION *UNUSUAL SHORTNESS OF BREATH *UNUSUAL BRUISING OR BLEEDING *URINARY PROBLEMS (pain or burning when urinating, or frequent urination) *BOWEL PROBLEMS (unusual diarrhea, constipation, pain near the anus) TENDERNESS IN MOUTH AND THROAT WITH OR WITHOUT PRESENCE OF ULCERS (sore throat, sores in mouth, or a toothache) UNUSUAL RASH, SWELLING OR PAIN  UNUSUAL VAGINAL DISCHARGE OR ITCHING   Items with * indicate a potential emergency and should be followed up as soon as possible or go to the Emergency Department if any problems should occur.  Please show the CHEMOTHERAPY ALERT CARD or IMMUNOTHERAPY  ALERT CARD at check-in to the Emergency Department and triage nurse.  Should you have questions after your visit or need to cancel or reschedule your appointment, please contact [facility]  [phone] and follow the prompts.  Office hours are 8:00 a.m. to 4:30 p.m. Monday - Friday. Please note that voicemails left after 4:00 p.m. may not be returned until the following business day.  We are closed weekends and major holidays. You have access to a nurse at all times for urgent questions. Please call the main number to the clinic [phone] and follow the prompts.  For any non-urgent questions, you may also contact your provider using MyChart. We now offer e-Visits for anyone 25 and older to request care online for non-urgent symptoms. For details visit [email or web address].   Also download the MyChart app! Go to the app store, search "MyChart", open the app, select [facility], and log in with your MyChart username and password.  Due to Covid, a mask is required upon entering the hospital/clinic. If you do not have a mask, one will be given to you upon arrival. For doctor visits, patients may have 1 support person aged 70 or older with them. For treatment visits, patients cannot have anyone with them due to current Covid guidelines and our immunocompromised population. Etoposide, VP-16 injection What is this medication? ETOPOSIDE, VP-16 (e toe POE side) is a chemotherapy drug. It is used to treattesticular cancer, lung cancer, and other cancers. This medicine may be used for other purposes; ask your health care provider orpharmacist if you have questions. COMMON BRAND NAME(S): Etopophos, Toposar, VePesid What should I  tell my care team before I take this medication? They need to know if you have any of these conditions: infection kidney disease liver disease low blood counts, like low white cell, platelet, or red cell counts an unusual or allergic  reaction to etoposide, other medicines, foods, dyes, or preservatives pregnant or trying to get pregnant breast-feeding How should I use this medication? This medicine is for infusion into a vein. It is administered in a hospital orclinic by a specially trained health care professional. Talk to your pediatrician regarding the use of this medicine in children.Special care may be needed. Overdosage: If you think you have taken too much of this medicine contact apoison control center or emergency room at once. NOTE: This medicine is only for you. Do not share this medicine with others. What if I miss a dose? It is important not to miss your dose. Call your doctor or health careprofessional if you are unable to keep an appointment. What may interact with this medication? This medicine may interact with the following medications: warfarin This list may not describe all possible interactions. Give your health care provider a list of all the medicines, herbs, non-prescription drugs, or dietary supplements you use. Also tell them if you smoke, drink alcohol, or use illegaldrugs. Some items may interact with your medicine. What should I watch for while using this medication? Visit your doctor for checks on your progress. This drug may make you feel generally unwell. This is not uncommon, as chemotherapy can affect healthy cells as well as cancer cells. Report any side effects. Continue your course oftreatment even though you feel ill unless your doctor tells you to stop. In some cases, you may be given additional medicines to help with side effects.Follow all directions for their use. Call your doctor or health care professional for advice if you get a fever, chills or sore throat, or other symptoms of a cold or flu. Do not treat yourself. This drug decreases your body's ability to fight infections. Try toavoid being around people who are sick. This medicine may increase your risk to bruise or bleed. Call  your doctor orhealth care professional if you notice any unusual bleeding. Talk to your doctor about your risk of cancer. You may be more at risk forcertain types of cancers if you take this medicine. Do not become pregnant while taking this medicine or for at least 6 months after stopping it. Women should inform their doctor if they wish to become pregnant or think they might be pregnant. Women of child-bearing potential will need to have a negative pregnancy test before starting this medicine. There is a potential for serious side effects to an unborn child. Talk to your health care professional or pharmacist for more information. Do not breast-feed aninfant while taking this medicine. Men must use a latex condom during sexual contact with a woman while taking this medicine and for at least 4 months after stopping it. A latex condom is needed even if you have had a vasectomy. Contact your doctor right away if your partner becomes pregnant. Do not donate sperm while taking this medicine and for at least 4 months after you stop taking this medicine. Men should inform their doctors if they wish to father a child. This medicine may lower spermcounts. What side effects may I notice from receiving this medication? Side effects that you should report to your doctor or health care professionalas soon as possible: allergic reactions like skin rash, itching or hives, swelling of the  face, lips, or tongue low blood counts - this medicine may decrease the number of white blood cells, red blood cells, and platelets. You may be at increased risk for infections and bleeding nausea, vomiting redness, blistering, peeling or loosening of the skin, including inside the mouth signs and symptoms of infection like fever; chills; cough; sore throat; pain or trouble passing urine signs and symptoms of low red blood cells or anemia such as unusually weak or tired; feeling faint or lightheaded; falls; breathing problems unusual  bruising or bleeding Side effects that usually do not require medical attention (report to yourdoctor or health care professional if they continue or are bothersome): changes in taste diarrhea hair loss loss of appetite mouth sores This list may not describe all possible side effects. Call your doctor for medical advice about side effects. You may report side effects to FDA at1-800-FDA-1088. Where should I keep my medication? This drug is given in a hospital or clinic and will not be stored at home. NOTE: This sheet is a summary. It may not cover all possible information. If you have questions about this medicine, talk to your doctor, pharmacist, orhealth care provider.  2022 Elsevier/Gold Standard (2018-10-04 16:57:15) Carboplatin injection What is this medication? CARBOPLATIN (KAR boe pla tin) is a chemotherapy drug. It targets fast dividing cells, like cancer cells, and causes these cells to die. This medicine is usedto treat ovarian cancer and many other cancers. This medicine may be used for other purposes; ask your health care provider orpharmacist if you have questions. COMMON BRAND NAME(S): Paraplatin What should I tell my care team before I take this medication? They need to know if you have any of these conditions: blood disorders hearing problems kidney disease recent or ongoing radiation therapy an unusual or allergic reaction to carboplatin, cisplatin, other chemotherapy, other medicines, foods, dyes, or preservatives pregnant or trying to get pregnant breast-feeding How should I use this medication? This drug is usually given as an infusion into a vein. It is administered in [facility] or clinic by a specially trained health care professional. Talk to your pediatrician regarding the use of this medicine in children.Special care may be needed. Overdosage: If you think you have taken too much of this medicine contact apoison control center or emergency room at once. NOTE:  This medicine is only for you. Do not share this medicine with others. What if I miss a dose? It is important not to miss a dose. Call your doctor or health careprofessional if you are unable to keep an appointment. What may interact with this medication? medicines for seizures medicines to increase blood counts like filgrastim, pegfilgrastim, sargramostim some antibiotics like amikacin, gentamicin, neomycin, streptomycin, tobramycin vaccines Talk to your doctor or health care professional before taking any of thesemedicines: acetaminophen aspirin ibuprofen ketoprofen naproxen This list may not describe all possible interactions. Give your health care provider a list of all the medicines, herbs, non-prescription drugs, or dietary supplements you use. Also tell them if you smoke, drink alcohol, or use illegaldrugs. Some items may interact with your medicine. What should I watch for while using this medication? Your condition will be monitored carefully while you are receiving this medicine. You will need important blood work done while you are taking thismedicine. This drug may make you feel generally unwell. This is not uncommon, as chemotherapy can affect healthy cells as well as cancer cells. Report any side effects. Continue your course of treatment even though you feel ill unless yourdoctor tells you  to stop. In some cases, you may be given additional medicines to help with side effects.Follow all directions for their use. Call your doctor or health care professional for advice if you get a fever, chills or sore throat, or other symptoms of a cold or flu. Do not treat yourself. This drug decreases your body's ability to fight infections. Try toavoid being around people who are sick. This medicine may increase your risk to bruise or bleed. Call your doctor orhealth care professional if you notice any unusual bleeding. Be careful brushing and flossing your teeth or using a toothpick because  you may get an infection or bleed more easily. If you have any dental work done,tell your dentist you are receiving this medicine. Avoid taking products that contain aspirin, acetaminophen, ibuprofen, naproxen, or ketoprofen unless instructed by your doctor. These medicines may hide afever. Do not become pregnant while taking this medicine. Women should inform their doctor if they wish to become pregnant or think they might be pregnant. There is a potential for serious side effects to an unborn child. Talk to your health care professional or pharmacist for more information. Do not breast-feed aninfant while taking this medicine. What side effects may I notice from receiving this medication? Side effects that you should report to your doctor or health care professionalas soon as possible: allergic reactions like skin rash, itching or hives, swelling of the face, lips, or tongue signs of infection - fever or chills, cough, sore throat, pain or difficulty passing urine signs of decreased platelets or bleeding - bruising, pinpoint red spots on the skin, black, tarry stools, nosebleeds signs of decreased red blood cells - unusually weak or tired, fainting spells, lightheadedness breathing problems changes in hearing changes in vision chest pain high blood pressure low blood counts - This drug may decrease the number of white blood cells, red blood cells and platelets. You may be at increased risk for infections and bleeding. nausea and vomiting pain, swelling, redness or irritation at the injection site pain, tingling, numbness in the hands or feet problems with balance, talking, walking trouble passing urine or change in the amount of urine Side effects that usually do not require medical attention (report to yourdoctor or health care professional if they continue or are bothersome): hair loss loss of appetite metallic taste in the mouth or changes in taste This list may not describe all possible  side effects. Call your doctor for medical advice about side effects. You may report side effects to FDA at1-800-FDA-1088. Where should I keep my medication? This drug is given in a hospital or clinic and will not be stored at home. NOTE: This sheet is a summary. It may not cover all possible information. If you have questions about this medicine, talk to your doctor, pharmacist, orhealth care provider.  2022 Elsevier/Gold Standard (2007-11-14 14:38:05) Atezolizumab injection What is this medication? ATEZOLIZUMAB (a te zoe LIZ ue mab) is a monoclonal antibody. It is used to treat bladder cancer (urothelial cancer), liver cancer, lung cancer, andmelanoma. This medicine may be used for other purposes; ask your health care provider orpharmacist if you have questions. COMMON BRAND NAME(S): Tecentriq What should I tell my care team before I take this medication? They need to know if you have any of these conditions: autoimmune diseases like Crohn's disease, ulcerative colitis, or lupus have had or planning to have an allogeneic stem cell transplant (uses someone else's stem cells) history of organ transplant history of radiation to the chest nervous system  problems like myasthenia gravis or Guillain-Barre syndrome an unusual or allergic reaction to atezolizumab, other medicines, foods, dyes, or preservatives pregnant or trying to get pregnant breast-feeding How should I use this medication? This medicine is for infusion into a vein. It is given by a health careprofessional in a hospital or clinic setting. A special MedGuide will be given to you before each treatment. Be sure to readthis information carefully each time. Talk to your pediatrician regarding the use of this medicine in children.Special care may be needed. Overdosage: If you think you have taken too much of this medicine contact apoison control center or emergency room at once. NOTE: This medicine is only for you. Do not share this  medicine with others. What if I miss a dose? It is important not to miss your dose. Call your doctor or health careprofessional if you are unable to keep an appointment. What may interact with this medication? Interactions have not been studied. This list may not describe all possible interactions. Give your health care provider a list of all the medicines, herbs, non-prescription drugs, or dietary supplements you use. Also tell them if you smoke, drink alcohol, or use illegaldrugs. Some items may interact with your medicine. What should I watch for while using this medication? Your condition will be monitored carefully while you are receiving thismedicine. You may need blood work done while you are taking this medicine. Do not become pregnant while taking this medicine or for at least 5 months after stopping it. Women should inform their doctor if they wish to become pregnant or think they might be pregnant. There is a potential for serious side effects to an unborn child. Talk to your health care professional or pharmacist for more information. Do not breast-feed an infant while taking this medicineor for at least 5 months after the last dose. What side effects may I notice from receiving this medication? Side effects that you should report to your doctor or health care professionalas soon as possible: allergic reactions like skin rash, itching or hives, swelling of the face, lips, or tongue black, tarry stools bloody or watery diarrhea breathing problems changes in vision chest pain or chest tightness chills facial flushing fever headache signs and symptoms of high blood sugar such as dizziness; dry mouth; dry skin; fruity breath; nausea; stomach pain; increased hunger or thirst; increased urination signs and symptoms of liver injury like dark yellow or brown urine; general ill feeling or flu-like symptoms; light-colored stools; loss of appetite; nausea; right upper belly pain; unusually  weak or tired; yellowing of the eyes or skin stomach pain trouble passing urine or change in the amount of urine Side effects that usually do not require medical attention (report to yourdoctor or health care professional if they continue or are bothersome): bone pain cough diarrhea joint pain muscle pain muscle weakness swelling of arms or legs tiredness weight loss This list may not describe all possible side effects. Call your doctor for medical advice about side effects. You may report side effects to FDA at1-800-FDA-1088. Where should I keep my medication? This drug is given in a hospital or clinic and will not be stored at home. NOTE: This sheet is a summary. It may not cover all possible information. If you have questions about this medicine, talk to your doctor, pharmacist, orhealth care provider.  2022 Elsevier/Gold Standard (2020-05-08 13:59:34)

## 2021-02-17 NOTE — Progress Notes (Signed)
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  Patient completed radiation on 5/31. Currently receiving chemotherapy. Noted recent hospitalization for right lower extremity cellulitis.    Met with patient during infusion.  Patient reports that her appetite during admission was not good but has improved since being out of the hospital.  Reports that she could not drink the "milky" shakes.  Daughter recently purchased her pedialyte juice type shake.  Reports yesterday for lunch ate 1/2 steak, broccoli and carrots, bread, fruit, onions for lunch then ate ice cream.  Dinner last night was other half of steak, fruit and bread.      Medications: reviewed  Labs: Na 127, K 3.4  Anthropometrics:   Weight 140 lb today  139 lb on 5/31 145 lb on 5/12 144 lb on 11/19/20   NUTRITION DIAGNOSIS: Inadequate oral intake continues with weight loss    INTERVENTION:  Samples of boost soothe and breeze and ensure clear given to patient to try. Instructions on where to find was provided.  Encouraged 1-2 per day Encouraged high calorie, high protein foods to prevent further weight loss    MONITORING, EVALUATION, GOAL: weight trends, intake   NEXT VISIT: Tuesday, July 19 during infusion  Chrisma Hurlock B. Zenia Resides, [place], [place] Registered Dietitian [phone] (mobile)

## 2021-02-17 NOTE — Progress Notes (Signed)
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  She underwent CT chest with contrast in March 2022 following an abnormal x-ray which showed a 2.6 cm mass in the left lower lobe.  This was followed by a PET CT scan which showed a 3.0 x 2.0 cm mass in the left lower lobe of the lung with an SUV of 9.7.  No evidence of locoregional adenopathy.  No evidence of intra-abdominal disease with patient was noted to have a hypermetabolic focus in the right iliac bone with an SUV of 11.1 which was suspicious for metastatic disease.  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  Cells were positive for TTF-1, CD56 and chromogranin.  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  Plan is therefore to hold off on chemotherapy until she completes whole brain radiation and steroid taper    Interval history-patient is much improved after her recent hospitalization.  She still has  some baseline fatigue but overall it has improved.  Mental status is improved with no periods of confusion.  She does have some ongoing memory loss.  ECOG PS- 1 Pain scale- 0   Review of systems- Review of Systems  Constitutional:  Positive for malaise/fatigue. Negative for chills, fever and weight loss.  HENT:  Negative for congestion, ear discharge and nosebleeds.   Eyes:  Negative for blurred vision.  Respiratory:  Negative for cough, hemoptysis, sputum production, shortness of breath and wheezing.   Cardiovascular:  Negative for chest pain, palpitations, orthopnea and claudication.  Gastrointestinal:  Negative for abdominal pain, blood in stool, constipation, diarrhea, heartburn, melena, nausea and vomiting.  Genitourinary:  Negative for dysuria, flank pain, frequency, hematuria and urgency.  Musculoskeletal:  Negative for back pain, joint pain and myalgias.  Skin:  Negative for rash.  Neurological:  Negative for dizziness, tingling, focal weakness, seizures, weakness and headaches.  Endo/Heme/Allergies:  Does not bruise/bleed easily.  Psychiatric/Behavioral:  Positive for memory loss. Negative for depression and suicidal ideas. The patient does not have insomnia.      Allergies  Allergen Reactions   Augmentin [Amoxicillin-Pot Clavulanate]     "messes up my blood cells"   Codeine Nausea Only   Levaquin [Levofloxacin]     Delusions    Tape Rash    Some bandaids cause rash sometimes     Past Medical History:  Diagnosis Date   Anxiety    Breathing problem    low breathing function 53%   COPD (chronic obstructive pulmonary disease) (HCC)    EMPHYSEMA   Dysrhythmia  tachycardia   Emphysema of lung (HCC)    Hemorrhoid    History of hiatal hernia    SMALL- 2022   Hypercholesteremia    Hypertension    Hypothyroidism    Larynx polyp    Lung cancer (HCC)    Palpitations    with anxiety   Platelets decreased ([place]) 2020   Seasonal allergies    Tinnitus    Vertigo     no episodes in several years   Wears dentures    partial lower   White coat syndrome with hypertension      Past Surgical History:  Procedure Laterality Date   ABDOMINAL HYSTERECTOMY     partial   COLONOSCOPY WITH PROPOFOL N/A 01/22/2016   Procedure: COLONOSCOPY WITH PROPOFOL;  Surgeon: Lucilla Lame, MD;  Location: [place];  Service: Endoscopy;  Laterality: N/A;   COLONOSCOPY WITH PROPOFOL N/A 04/12/2019   Procedure: COLONOSCOPY WITH BIOPSY;  Surgeon: Lucilla Lame, MD;  Location: [place];  Service: Endoscopy;  Laterality: N/A;  pt would like an early appt   ct guided lung bx     IR IMAGING GUIDED PORT INSERTION  12/18/2020   POLYPECTOMY  01/22/2016   Procedure: POLYPECTOMY;  Surgeon: Lucilla Lame, MD;  Location: [place];  Service: Endoscopy;;   POLYPECTOMY N/A 04/12/2019   Procedure: POLYPECTOMY;  Surgeon: Lucilla Lame, MD;  Location: [place];  Service: Endoscopy;  Laterality: N/A;   VIDEO BRONCHOSCOPY WITH ENDOBRONCHIAL NAVIGATION N/A 11/21/2020   Procedure: VIDEO BRONCHOSCOPY WITH ENDOBRONCHIAL NAVIGATION;  Surgeon: Ottie Glazier, MD;  Location: [facility];  Service: Thoracic;  Laterality: N/A;   VIDEO BRONCHOSCOPY WITH ENDOBRONCHIAL ULTRASOUND N/A 11/21/2020   Procedure: VIDEO BRONCHOSCOPY WITH ENDOBRONCHIAL ULTRASOUND;  Surgeon: Ottie Glazier, MD;  Location: [facility];  Service: Thoracic;  Laterality: N/A;    Social History   Socioeconomic History   Marital status: Married    Spouse name: Not on file   Number of children: Not on file   Years of education: Not on file   Highest education level: Not on file  Occupational History   Not on file  Tobacco Use   Smoking status: Former    Packs/day: 0.50    Years: 50.00    Pack years: 25.00    Types: Cigarettes    Quit date: 11/03/2020    Years since quitting: 0.2   Smokeless tobacco: Never  Vaping Use   Vaping Use: Some days  Substance and Sexual Activity   Alcohol use: No   Drug use:  Never   Sexual activity: Not on file  Other Topics Concern   Not on file  Social History Narrative   Not on file   Social Determinants of Health   Financial Resource Strain: Not on file  Food Insecurity: Not on file  Transportation Needs: Not on file  Physical Activity: Not on file  Stress: Not on file  Social Connections: Not on file  Intimate Partner Violence: Not on file    Family History  Problem Relation Age of Onset   Alzheimer's disease Mother    Heart attack Mother    High Cholesterol Mother    Hypertension Mother    Heart block Mother    Lung cancer Father    Uterine cancer Sister    Hypertension Sister    Heart block Sister    Anxiety disorder Sister    Breast cancer Neg Hx      Current Outpatient Medications:    acetaminophen (TYLENOL)  500 MG tablet, Take 500 mg by mouth every 6 (six) hours as needed for mild pain or moderate pain., Disp: , Rfl:    atorvastatin (LIPITOR) 10 MG tablet, TAKE 1 TABLET BY MOUTH DAILY, Disp: 90 tablet, Rfl: 0   doxycycline (VIBRAMYCIN) 100 MG capsule, Take 100 mg by mouth 2 (two) times daily., Disp: , Rfl:    Fluticasone-Umeclidin-Vilant (TRELEGY ELLIPTA) 100-62.5-25 MCG/INH AEPB, Inhale into the lungs., Disp: , Rfl:    ketotifen (ZADITOR) 0.025 % ophthalmic solution, Place 1 drop into both eyes 2 (two) times daily as needed., Disp: , Rfl:    levothyroxine (SYNTHROID, LEVOTHROID) 50 MCG tablet, Take 50 mcg by mouth daily before breakfast., Disp: , Rfl:    loratadine (CLARITIN) 10 MG tablet, Take 10 mg by mouth daily. Reported on 01/22/2016, Disp: , Rfl:    LORazepam (ATIVAN) 0.5 MG tablet, Take 1 tablet (0.5 mg total) by mouth every 6 (six) hours as needed for anxiety., Disp: 120 tablet, Rfl: 0   losartan-hydrochlorothiazide (HYZAAR) 100-25 MG tablet, Take 1 tablet by mouth daily., Disp: , Rfl:    magnesium chloride (SLOW-MAG) 64 MG TBEC SR tablet, Take 1 tablet (64 mg total) by mouth daily., Disp: 30 tablet, Rfl: 0   metoprolol  succinate (TOPROL-XL) 50 MG 24 hr tablet, Take 50 mg by mouth daily., Disp: , Rfl:    nystatin (MYCOSTATIN) 100000 UNIT/ML suspension, SHAKE LIQUID AND TAKE 5 ML BY MOUTH FOUR TIMES DAILY, Disp: 60 mL, Rfl: 0   ondansetron (ZOFRAN) 8 MG tablet, Take 8 mg by mouth 2 (two) times daily as needed for nausea or vomiting., Disp: , Rfl:    PARoxetine (PAXIL) 10 MG tablet, Take 5 mg by mouth every evening., Disp: , Rfl:    polyethylene glycol (MIRALAX / GLYCOLAX) 17 g packet, Take 17 g by mouth daily as needed., Disp: , Rfl:    potassium chloride SA (KLOR-CON) 20 MEQ tablet, Take 1 tablet (20 mEq total) by mouth daily., Disp: 28 tablet, Rfl: 0   prochlorperazine (COMPAZINE) 10 MG tablet, Take 10 mg by mouth as needed for nausea or vomiting., Disp: , Rfl:    QUEtiapine (SEROQUEL) 25 MG tablet, Take 1 tablet (25 mg total) by mouth at bedtime., Disp: 14 tablet, Rfl: 0   sodium chloride 1 g tablet, Take 1 tablet (1 g total) by mouth 3 (three) times daily., Disp: 42 tablet, Rfl: 0   magic mouthwash w/lidocaine SOLN, Take 5 mLs by mouth 4 (four) times daily as needed for mouth pain. (Patient not taking: Reported on 02/17/2021), Disp: 240 mL, Rfl: 0 No current facility-administered medications for this visit.  Facility-Administered Medications Ordered in Other Visits:    0.9 %  sodium chloride infusion, , Intravenous, Once, Sindy Guadeloupe, MD  Physical exam:  Vitals:   02/17/21 1018  BP: (!) 158/73  Pulse: 86  Resp: 18  Temp: (!) 97 F (36.1 C)  TempSrc: Tympanic  SpO2: 100%  Weight: 140 lb 8 oz (63.7 kg)   Physical Exam Cardiovascular:     Rate and Rhythm: Normal rate and regular rhythm.     Heart sounds: Normal heart sounds.  Pulmonary:     Effort: Pulmonary effort is normal.     Breath sounds: Normal breath sounds.  Abdominal:     General: Bowel sounds are normal.     Palpations: Abdomen is soft.  Skin:    General: Skin is warm and dry.     Comments: Solitary oval ulceration seen over right  lower extremity following fall.  Healthy granulation tissue at the base.  No evidence of infection  Neurological:     Mental Status: She is alert and oriented to person, place, and time.     CMP Latest Ref Rng & Units 02/17/2021  Glucose 70 - 99 mg/dL 120(H)  BUN 8 - 23 mg/dL 11  Creatinine 0.44 - 1.00 mg/dL 0.97  Sodium 135 - 145 mmol/L 127(L)  Potassium 3.5 - 5.1 mmol/L 3.4(L)  Chloride 98 - 111 mmol/L 94(L)  CO2 22 - 32 mmol/L 26  Calcium 8.9 - 10.3 mg/dL 8.7(L)  Total Protein 6.5 - 8.1 g/dL 6.0(L)  Total Bilirubin 0.3 - 1.2 mg/dL 0.7  Alkaline Phos 38 - 126 U/L 61  AST 15 - 41 U/L 20  ALT 0 - 44 U/L 13   CBC Latest Ref Rng & Units 02/17/2021  WBC 4.0 - 10.5 K/uL 6.5  Hemoglobin 12.0 - 15.0 g/dL 9.3(L)  Hematocrit 36.0 - 46.0 % 28.8(L)  Platelets 150 - 400 K/uL 212    No images are attached to the encounter.  DG Chest 2 View  Result Date: 02/07/2021 CLINICAL DATA:  Sepsis, COPD EXAM: CHEST - 2 VIEW COMPARISON:  02/02/2021, 11/19/2020 FINDINGS: Right-sided chest port terminates at the level of the superior cavoatrial junction. Heart size is normal. Atherosclerotic calcification of the aortic knob. 1.5 cm left lower lobe lung nodule, similar in size to the recent prior chest x-ray. Lungs are hyperinflated and appear otherwise clear. No pleural effusion or pneumothorax. IMPRESSION: No acute cardiopulmonary abnormality. Electronically Signed   By: Davina Poke D.O.   On: 02/07/2021 13:32   DG Chest 2 View  Result Date: 01/28/2021 CLINICAL DATA:  Weakness. EXAM: CHEST - 2 VIEW COMPARISON:  None. FINDINGS: A right-sided venous Port-A-Cath is seen with its distal tip noted at the junction of the superior vena cava and right atrium. The lungs are hyperinflated. A 1.3 cm x 1.5 cm opacity is seen overlying the left lung base on the frontal view. Mild left basilar linear scarring and/or atelectasis is noted. The heart size and mediastinal contours are within normal limits. The visualized  skeletal structures are unremarkable. IMPRESSION: 1. Mild left basilar linear scarring and/or atelectasis. 2. Focal opacity overlying the left lung base which may represent an overlying nipple shadow versus residual left basilar lung mass. Electronically Signed   By: Virgina Norfolk M.D.   On: 01/28/2021 21:37   CT Head Wo Contrast  Result Date: 02/07/2021 CLINICAL DATA:  Patient with confusion. EXAM: CT HEAD WITHOUT CONTRAST TECHNIQUE: Contiguous axial images were obtained from the base of the skull through the vertex without intravenous contrast. COMPARISON:  Brain CT 02/03/2021. FINDINGS: Brain: Ventricles and sulci are appropriate for patient's age. No evidence for acute cortically based infarct, intracranial hemorrhage, mass lesion or mass-effect. Vascular: Unremarkable. Skull: Intact. Sinuses/Orbits: Paranasal sinuses well aerated. Mastoid air cells are unremarkable. Orbits are unremarkable. Other: None. IMPRESSION: No acute intracranial process. Electronically Signed   By: Lovey Newcomer M.D.   On: 02/07/2021 13:18   CT Head W or Wo Contrast  Result Date: 02/03/2021 CLINICAL DATA:  Encephalopathy. EXAM: CT HEAD WITHOUT AND WITH CONTRAST TECHNIQUE: Contiguous axial images were obtained from the base of the skull through the vertex without and with intravenous contrast CONTRAST:  56mL OMNIPAQUE IOHEXOL 300 MG/ML  SOLN COMPARISON:  06/05/2013 FINDINGS: Brain: There is no mass, hemorrhage or extra-axial collection. The size and configuration of the ventricles and extra-axial CSF spaces are normal. The  brain parenchyma is normal, without acute or chronic infarction. No abnormal contrast enhancement. Vascular: No abnormal hyperdensity of the major intracranial arteries or dural venous sinuses. No intracranial atherosclerosis. Skull: The visualized skull base, calvarium and extracranial soft tissues are normal. Sinuses/Orbits: No fluid levels or advanced mucosal thickening of the visualized paranasal sinuses.  No mastoid or middle ear effusion. The orbits are normal. IMPRESSION: Normal head CT. Electronically Signed   By: Ulyses Jarred M.D.   On: 02/03/2021 01:05   MR BRAIN W WO CONTRAST  Result Date: 02/07/2021 CLINICAL DATA:  Encephalopathy. History of small cell lung carcinoma with metastases to bone. EXAM: MRI HEAD WITHOUT AND WITH CONTRAST TECHNIQUE: Multiplanar, multiecho pulse sequences of the brain and surrounding structures were obtained without and with intravenous contrast. CONTRAST:  7.73mL GADAVIST GADOBUTROL 1 MMOL/ML IV SOLN COMPARISON:  12/24/2020 FINDINGS: Brain: No acute infarct, mass effect or extra-axial collection. Single focus of high right convexity chronic microhemorrhage. There is multifocal hyperintense T2-weighted signal within the white matter. Parenchymal volume and CSF spaces are normal. The midline structures are normal. Punctate contrast enhancing lesion in the posterior right parietal lobe measuring 3 mm (series 21, image 9). No other contrast-enhancing lesions. Vascular: Major flow voids are preserved. Skull and upper cervical spine: Normal calvarium and skull base. Visualized upper cervical spine and soft tissues are normal. Sinuses/Orbits:No paranasal sinus fluid levels or advanced mucosal thickening. No mastoid or middle ear effusion. Normal orbits. IMPRESSION: 1. Posterior right parietal lobe metastasis measuring 3 mm. 2. Findings of chronic microvascular ischemia. Electronically Signed   By: Ulyses Jarred M.D.   On: 02/07/2021 22:10   US Venous Img Lower Bilateral (DVT)  Result Date: 02/03/2021 CLINICAL DATA:  75 year old female with a history bilateral lower extremity pain and swelling EXAM: BILATERAL LOWER EXTREMITY VENOUS DOPPLER ULTRASOUND TECHNIQUE: Gray-scale sonography with graded compression, as well as color Doppler and duplex ultrasound were performed to evaluate the lower extremity deep venous systems from the level of the common femoral vein and including the  common femoral, femoral, profunda femoral, popliteal and calf veins including the posterior tibial, peroneal and gastrocnemius veins when visible. The superficial great saphenous vein was also interrogated. Spectral Doppler was utilized to evaluate flow at rest and with distal augmentation maneuvers in the common femoral, femoral and popliteal veins. COMPARISON:  None. FINDINGS: RIGHT LOWER EXTREMITY Common Femoral Vein: No evidence of thrombus. Normal compressibility, respiratory phasicity and response to augmentation. Saphenofemoral Junction: No evidence of thrombus. Normal compressibility and flow on color Doppler imaging. Profunda Femoral Vein: No evidence of thrombus. Normal compressibility and flow on color Doppler imaging. Femoral Vein: No evidence of thrombus. Normal compressibility, respiratory phasicity and response to augmentation. Popliteal Vein: No evidence of thrombus. Normal compressibility, respiratory phasicity and response to augmentation. Calf Veins: No evidence of thrombus. Normal compressibility and flow on color Doppler imaging. Superficial Great Saphenous Vein: No evidence of thrombus. Normal compressibility and flow on color Doppler imaging. Other Findings:  None. LEFT LOWER EXTREMITY Common Femoral Vein: No evidence of thrombus. Normal compressibility, respiratory phasicity and response to augmentation. Saphenofemoral Junction: No evidence of thrombus. Normal compressibility and flow on color Doppler imaging. Profunda Femoral Vein: No evidence of thrombus. Normal compressibility and flow on color Doppler imaging. Femoral Vein: No evidence of thrombus. Normal compressibility, respiratory phasicity and response to augmentation. Popliteal Vein: No evidence of thrombus. Normal compressibility, respiratory phasicity and response to augmentation. Calf Veins: No evidence of thrombus. Normal compressibility and flow on color Doppler imaging. Superficial Great  Saphenous Vein: No evidence of thrombus.  Normal compressibility and flow on color Doppler imaging. Other Findings:  None. IMPRESSION: Sonographic survey of the bilateral lower extremities negative for DVT Electronically Signed   By: Corrie Mckusick D.O.   On: 02/03/2021 11:38   DG Chest Port 1 View  Result Date: 02/02/2021 CLINICAL DATA:  Questionable sepsis EXAM: PORTABLE CHEST 1 VIEW COMPARISON:  01/28/2021 FINDINGS: Right Port-A-Cath remains in place, unchanged. There is hyperinflation of the lungs compatible with COPD. Heart is normal size. Aortic calcifications. No confluent opacities or effusions. No acute bony abnormality. IMPRESSION: COPD.  No active disease. Electronically Signed   By: Rolm Baptise M.D.   On: 02/02/2021 23:33     Assessment and plan- Patient is a 75 y.o. female with extensive stage small cell lung cancer and brain metastases.  She is here for on treatment assessment prior to cycle 3 of carbo etoposide Tecentriq chemotherapy.  Cycle 3 was delayed by week due to cytopenias which have now resolved.  She will proceed with cycle 3 of carbo etoposide chemotherapy along with Tecentriq today and etoposide tomorrow and day after abiraterone Neulasta support.  I will dose reduce carboplatin to AUC 4 given that she required blood and platelet transfusion after cycle 2.  Hyponatremia: 1 L of IV fluids on day 3.  Dental clearance has been obtained for Zometa and she will proceed with Zometa tomorrow on 02/18/2021.  Calcium levels acceptable to proceed with Zometa  See NP Altha Harm in 1 week with labs CBC with differential, hold tube for possible transfusion and BMP for possible IV fluids  Labs CBC with differential and hold tube for possible transfusion in 2 weeks for possible IV fluids  See Dr. Janese Banks in 3 weeks.  Port labs CBC with differential, CMP.  Gets carbo etoposide chemotherapy on day 1 and etoposide on day 2 and day 3 with on for Neulasta support on day 3 with possible IV fluids on day 3   Plan is to complete 4  cycles followed by repeat scans   Visit Diagnosis 1. Encounter for antineoplastic chemotherapy   2. Small cell lung cancer in adult [facility])      Dr. Randa Evens, MD, MPH [facility] at [facility] [phone] 02/17/2021 4:16 PM

## 2021-02-18 ENCOUNTER — Encounter: Payer: Self-pay | Admitting: Oncology

## 2021-02-18 ENCOUNTER — Inpatient Hospital Stay: Payer: Medicare Other

## 2021-02-18 ENCOUNTER — Other Ambulatory Visit: Payer: Self-pay | Admitting: Oncology

## 2021-02-18 VITALS — BP 123/71 | HR 76 | Temp 96.3°F | Resp 20

## 2021-02-18 DIAGNOSIS — C3490 Malignant neoplasm of unspecified part of unspecified bronchus or lung: Secondary | ICD-10-CM

## 2021-02-18 DIAGNOSIS — C7951 Secondary malignant neoplasm of bone: Secondary | ICD-10-CM

## 2021-02-18 DIAGNOSIS — Z5112 Encounter for antineoplastic immunotherapy: Secondary | ICD-10-CM | POA: Diagnosis not present

## 2021-02-18 LAB — T4: T4, Total: 6.4 ug/dL (ref 4.5–12.0)

## 2021-02-18 MED ORDER — SODIUM CHLORIDE 0.9 % IV SOLN
10.0000 mg | Freq: Once | INTRAVENOUS | Status: AC
  Administered 2021-02-18: 10 mg via INTRAVENOUS
  Filled 2021-02-18: qty 10

## 2021-02-18 MED ORDER — SODIUM CHLORIDE 0.9% FLUSH
10.0000 mL | INTRAVENOUS | Status: DC | PRN
Start: 2021-02-18 — End: 2021-02-18
  Administered 2021-02-18: 10 mL
  Filled 2021-02-18: qty 10

## 2021-02-18 MED ORDER — HEPARIN SOD (PORK) LOCK FLUSH 100 UNIT/ML IV SOLN
500.0000 [IU] | Freq: Once | INTRAVENOUS | Status: AC | PRN
  Administered 2021-02-18: 500 [IU]
  Filled 2021-02-18: qty 5

## 2021-02-18 MED ORDER — SODIUM CHLORIDE 0.9 % IV SOLN
100.0000 mg/m2 | Freq: Once | INTRAVENOUS | Status: AC
  Administered 2021-02-18: 180 mg via INTRAVENOUS
  Filled 2021-02-18: qty 9

## 2021-02-18 MED ORDER — SODIUM CHLORIDE 0.9 % IV SOLN
Freq: Once | INTRAVENOUS | Status: AC
  Filled 2021-02-18: qty 250

## 2021-02-18 MED ORDER — ZOLEDRONIC ACID 4 MG/5ML IV CONC
3.5000 mg | INTRAVENOUS | Status: DC
  Administered 2021-02-18: 3.5 mg via INTRAVENOUS
  Filled 2021-02-18: qty 4.38

## 2021-02-18 NOTE — Patient Instructions (Signed)
[facility]  Discharge Instructions: Thank you for choosing [facility] to provide your oncology and hematology care.  If you have a lab appointment with the [facility], please go directly to the [facility] and check in at the registration area.  Wear comfortable clothing and clothing appropriate for easy access to any Portacath or PICC line.   We strive to give you quality time with your provider. You may need to reschedule your appointment if you arrive late (15 or more minutes).  Arriving late affects you and other patients whose appointments are after yours.  Also, if you miss three or more appointments without notifying the office, you may be dismissed from the clinic at the provider's discretion.      For prescription refill requests, have your pharmacy contact our office and allow 72 hours for refills to be completed.    Today you received the following chemotherapy and/or immunotherapy agents Zometa, Etoposide      To help prevent nausea and vomiting after your treatment, we encourage you to take your nausea medication as directed.  BELOW ARE SYMPTOMS THAT SHOULD BE REPORTED IMMEDIATELY: *FEVER GREATER THAN 100.4 F (38 C) OR HIGHER *CHILLS OR SWEATING *NAUSEA AND VOMITING THAT IS NOT CONTROLLED WITH YOUR NAUSEA MEDICATION *UNUSUAL SHORTNESS OF BREATH *UNUSUAL BRUISING OR BLEEDING *URINARY PROBLEMS (pain or burning when urinating, or frequent urination) *BOWEL PROBLEMS (unusual diarrhea, constipation, pain near the anus) TENDERNESS IN MOUTH AND THROAT WITH OR WITHOUT PRESENCE OF ULCERS (sore throat, sores in mouth, or a toothache) UNUSUAL RASH, SWELLING OR PAIN  UNUSUAL VAGINAL DISCHARGE OR ITCHING   Items with * indicate a potential emergency and should be followed up as soon as possible or go to the Emergency Department if any problems should occur.  Please show the CHEMOTHERAPY ALERT CARD or IMMUNOTHERAPY ALERT CARD at  check-in to the Emergency Department and triage nurse.  Should you have questions after your visit or need to cancel or reschedule your appointment, please contact [facility]  [phone] and follow the prompts.  Office hours are 8:00 a.m. to 4:30 p.m. Monday - Friday. Please note that voicemails left after 4:00 p.m. may not be returned until the following business day.  We are closed weekends and major holidays. You have access to a nurse at all times for urgent questions. Please call the main number to the clinic [phone] and follow the prompts.  For any non-urgent questions, you may also contact your provider using MyChart. We now offer e-Visits for anyone 65 and older to request care online for non-urgent symptoms. For details visit [email or web address].   Also download the MyChart app! Go to the app store, search "MyChart", open the app, select [facility], and log in with your MyChart username and password.  Due to Covid, a mask is required upon entering the hospital/clinic. If you do not have a mask, one will be given to you upon arrival. For doctor visits, patients may have 1 support person aged 70 or older with them. For treatment visits, patients cannot have anyone with them due to current Covid guidelines and our immunocompromised population. Etoposide, VP-16 injection What is this medication? ETOPOSIDE, VP-16 (e toe POE side) is a chemotherapy drug. It is used to treattesticular cancer, lung cancer, and other cancers. This medicine may be used for other purposes; ask your health care provider orpharmacist if you have questions. COMMON BRAND NAME(S): Etopophos, Toposar, VePesid What should I tell  my care team before I take this medication? They need to know if you have any of these conditions: infection kidney disease liver disease low blood counts, like low white cell, platelet, or red cell counts an unusual or allergic reaction to  etoposide, other medicines, foods, dyes, or preservatives pregnant or trying to get pregnant breast-feeding How should I use this medication? This medicine is for infusion into a vein. It is administered in a hospital orclinic by a specially trained health care professional. Talk to your pediatrician regarding the use of this medicine in children.Special care may be needed. Overdosage: If you think you have taken too much of this medicine contact apoison control center or emergency room at once. NOTE: This medicine is only for you. Do not share this medicine with others. What if I miss a dose? It is important not to miss your dose. Call your doctor or health careprofessional if you are unable to keep an appointment. What may interact with this medication? This medicine may interact with the following medications: warfarin This list may not describe all possible interactions. Give your health care provider a list of all the medicines, herbs, non-prescription drugs, or dietary supplements you use. Also tell them if you smoke, drink alcohol, or use illegaldrugs. Some items may interact with your medicine. What should I watch for while using this medication? Visit your doctor for checks on your progress. This drug may make you feel generally unwell. This is not uncommon, as chemotherapy can affect healthy cells as well as cancer cells. Report any side effects. Continue your course oftreatment even though you feel ill unless your doctor tells you to stop. In some cases, you may be given additional medicines to help with side effects.Follow all directions for their use. Call your doctor or health care professional for advice if you get a fever, chills or sore throat, or other symptoms of a cold or flu. Do not treat yourself. This drug decreases your body's ability to fight infections. Try toavoid being around people who are sick. This medicine may increase your risk to bruise or bleed. Call your doctor  orhealth care professional if you notice any unusual bleeding. Talk to your doctor about your risk of cancer. You may be more at risk forcertain types of cancers if you take this medicine. Do not become pregnant while taking this medicine or for at least 6 months after stopping it. Women should inform their doctor if they wish to become pregnant or think they might be pregnant. Women of child-bearing potential will need to have a negative pregnancy test before starting this medicine. There is a potential for serious side effects to an unborn child. Talk to your health care professional or pharmacist for more information. Do not breast-feed aninfant while taking this medicine. Men must use a latex condom during sexual contact with a woman while taking this medicine and for at least 4 months after stopping it. A latex condom is needed even if you have had a vasectomy. Contact your doctor right away if your partner becomes pregnant. Do not donate sperm while taking this medicine and for at least 4 months after you stop taking this medicine. Men should inform their doctors if they wish to father a child. This medicine may lower spermcounts. What side effects may I notice from receiving this medication? Side effects that you should report to your doctor or health care professionalas soon as possible: allergic reactions like skin rash, itching or hives, swelling of the face,  lips, or tongue low blood counts - this medicine may decrease the number of white blood cells, red blood cells, and platelets. You may be at increased risk for infections and bleeding nausea, vomiting redness, blistering, peeling or loosening of the skin, including inside the mouth signs and symptoms of infection like fever; chills; cough; sore throat; pain or trouble passing urine signs and symptoms of low red blood cells or anemia such as unusually weak or tired; feeling faint or lightheaded; falls; breathing problems unusual bruising or  bleeding Side effects that usually do not require medical attention (report to yourdoctor or health care professional if they continue or are bothersome): changes in taste diarrhea hair loss loss of appetite mouth sores This list may not describe all possible side effects. Call your doctor for medical advice about side effects. You may report side effects to FDA at1-800-FDA-1088. Where should I keep my medication? This drug is given in a hospital or clinic and will not be stored at home. NOTE: This sheet is a summary. It may not cover all possible information. If you have questions about this medicine, talk to your doctor, pharmacist, orhealth care provider.  2022 Elsevier/Gold Standard (2018-10-04 16:57:15) Zoledronic Acid Injection (Hypercalcemia, Oncology) What is this medication? ZOLEDRONIC ACID (ZOE le dron ik AS id) slows calcium loss from bones. It high calcium levels in the blood from some kinds of cancer. It may be used in otherpeople at risk for bone loss. This medicine may be used for other purposes; ask your health care provider orpharmacist if you have questions. COMMON BRAND NAME(S): Zometa What should I tell my care team before I take this medication? They need to know if you have any of these conditions: cancer dehydration dental disease kidney disease liver disease low levels of calcium in the blood lung or breathing disease (asthma) receiving steroids like dexamethasone or prednisone an unusual or allergic reaction to zoledronic acid, other medicines, foods, dyes, or preservatives pregnant or trying to get pregnant breast-feeding How should I use this medication? This drug is injected into a vein. It is given by a health care provider in [place] [facility] or clinic setting. Talk to your health care provider about the use of this drug in children.Special care may be needed. Overdosage: If you think you have taken too much of this medicine contact apoison control center or  emergency room at once. NOTE: This medicine is only for you. Do not share this medicine with others. What if I miss a dose? Keep appointments for follow-up doses. It is important not to miss your dose.Call your health care provider if you are unable to keep an appointment. What may interact with this medication? certain antibiotics given by injection NSAIDs, medicines for pain and inflammation, like ibuprofen or naproxen some diuretics like bumetanide, furosemide teriparatide thalidomide This list may not describe all possible interactions. Give your health care provider a list of all the medicines, herbs, non-prescription drugs, or dietary supplements you use. Also tell them if you smoke, drink alcohol, or use illegaldrugs. Some items may interact with your medicine. What should I watch for while using this medication? Visit your health care provider for regular checks on your progress. It may besome time before you see the benefit from this drug. Some people who take this drug have severe bone, joint, or muscle pain. This drug may also increase your risk for jaw problems or a broken thigh bone. Tell your health care provider right away if you have severe pain in your  jaw, bones, joints, or muscles. Tell you health care provider if you have any painthat does not go away or that gets worse. Tell your dentist and dental surgeon that you are taking this drug. You should not have major dental surgery while on this drug. See your dentist to have a dental exam and fix any dental problems before starting this drug. Take good care of your teeth while on this drug. Make sure you see your dentist forregular follow-up appointments. You should make sure you get enough calcium and vitamin D while you are taking this drug. Discuss the foods you eat and the vitamins you take with your healthcare provider. Check with your health care provider if you have severe diarrhea, nausea, and vomiting, or if you sweat a lot.  The loss of too much body fluid may make itdangerous for you to take this drug. You may need blood work done while you are taking this drug. Do not become pregnant while taking this drug. Women should inform their health care provider if they wish to become pregnant or think they might be pregnant. There is potential for serious harm to an unborn child. Talk to your healthcare provider for more information. What side effects may I notice from receiving this medication? Side effects that you should report to your doctor or health care provider assoon as possible: allergic reactions (skin rash, itching or hives; swelling of the face, lips, or tongue) bone pain infection (fever, chills, cough, sore throat, pain or trouble passing urine) jaw pain, especially after dental work joint pain kidney injury (trouble passing urine or change in the amount of urine) low blood pressure (dizziness; feeling faint or lightheaded, falls; unusually weak or tired) low calcium levels (fast heartbeat; muscle cramps or pain; pain, tingling, or numbness in the hands or feet; seizures) low magnesium levels (fast, irregular heartbeat; muscle cramp or pain; muscle weakness; tremors; seizures) low red blood cell counts (trouble breathing; feeling faint; lightheaded, falls; unusually weak or tired) muscle pain redness, blistering, peeling, or loosening of the skin, including inside the mouth severe diarrhea swelling of the ankles, feet, hands trouble breathing Side effects that usually do not require medical attention (report to yourdoctor or health care provider if they continue or are bothersome): anxious constipation coughing depressed mood eye irritation, itching, or pain fever general ill feeling or flu-like symptoms nausea pain, redness, or irritation at site where injected trouble sleeping This list may not describe all possible side effects. Call your doctor for medical advice about side effects. You may  report side effects to FDA at1-800-FDA-1088. Where should I keep my medication? This drug is given in a hospital or clinic. It will not be stored at home. NOTE: This sheet is a summary. It may not cover all possible information. If you have questions about this medicine, talk to your doctor, pharmacist, orhealth care provider.  2022 Elsevier/Gold Standard (2019-05-24 09:13:00)

## 2021-02-18 NOTE — Progress Notes (Signed)
Please cancel all thyroid tests

## 2021-02-19 ENCOUNTER — Other Ambulatory Visit: Payer: Self-pay

## 2021-02-19 ENCOUNTER — Telehealth: Payer: Self-pay | Admitting: *Deleted

## 2021-02-19 ENCOUNTER — Inpatient Hospital Stay: Payer: Medicare Other

## 2021-02-19 ENCOUNTER — Encounter: Payer: Self-pay | Admitting: Oncology

## 2021-02-19 VITALS — BP 153/62 | HR 80 | Temp 96.5°F | Resp 18

## 2021-02-19 DIAGNOSIS — Z5112 Encounter for antineoplastic immunotherapy: Secondary | ICD-10-CM | POA: Diagnosis not present

## 2021-02-19 DIAGNOSIS — C3490 Malignant neoplasm of unspecified part of unspecified bronchus or lung: Secondary | ICD-10-CM

## 2021-02-19 MED ORDER — SODIUM CHLORIDE 0.9 % IV SOLN
Freq: Once | INTRAVENOUS | Status: AC
  Filled 2021-02-19: qty 250

## 2021-02-19 MED ORDER — HEPARIN SOD (PORK) LOCK FLUSH 100 UNIT/ML IV SOLN
500.0000 [IU] | Freq: Once | INTRAVENOUS | Status: AC | PRN
  Administered 2021-02-19: 500 [IU]
  Filled 2021-02-19: qty 5

## 2021-02-19 MED ORDER — PEGFILGRASTIM 6 MG/0.6ML ~~LOC~~ PSKT
6.0000 mg | PREFILLED_SYRINGE | Freq: Once | SUBCUTANEOUS | Status: AC
  Administered 2021-02-19: 6 mg via SUBCUTANEOUS
  Filled 2021-02-19: qty 0.6

## 2021-02-19 MED ORDER — SODIUM CHLORIDE 0.9 % IV SOLN
10.0000 mg | Freq: Once | INTRAVENOUS | Status: AC
  Administered 2021-02-19: 10 mg via INTRAVENOUS
  Filled 2021-02-19: qty 10

## 2021-02-19 MED ORDER — HEPARIN SOD (PORK) LOCK FLUSH 100 UNIT/ML IV SOLN
INTRAVENOUS | Status: AC
  Filled 2021-02-19: qty 5

## 2021-02-19 MED ORDER — QUETIAPINE FUMARATE 25 MG PO TABS: 25.0000 mg | ORAL_TABLET | Freq: Every day | ORAL | 0 refills | Status: DC

## 2021-02-19 MED ORDER — MAGIC MOUTHWASH W/LIDOCAINE
5.0000 mL | Freq: Four times a day (QID) | ORAL | 0 refills | Status: DC | PRN
Start: 2021-02-19 — End: 2021-03-31

## 2021-02-19 MED ORDER — SODIUM CHLORIDE 0.9 % IV SOLN
100.0000 mg/m2 | Freq: Once | INTRAVENOUS | Status: AC
  Administered 2021-02-19: 180 mg via INTRAVENOUS
  Filled 2021-02-19: qty 9

## 2021-02-19 NOTE — Patient Instructions (Signed)
[facility]  Discharge Instructions: Thank you for choosing [facility] to provide your oncology and hematology care.  If you have a lab appointment with the Cancer Center, please go directly to the Cancer Center and check in at the registration area.  Wear comfortable clothing and clothing appropriate for easy access to any Portacath or PICC line.   We strive to give you quality time with your provider. You may need to reschedule your appointment if you arrive late (15 or more minutes).  Arriving late affects you and other patients whose appointments are after yours.  Also, if you miss three or more appointments without notifying the office, you may be dismissed from the clinic at the provider's discretion.      For prescription refill requests, have your pharmacy contact our office and allow 72 hours for refills to be completed.    Today you received the following chemotherapy and/or immunotherapy agents Etoposide     To help prevent nausea and vomiting after your treatment, we encourage you to take your nausea medication as directed.  BELOW ARE SYMPTOMS THAT SHOULD BE REPORTED IMMEDIATELY: . *FEVER GREATER THAN 100.4 F (38 C) OR HIGHER . *CHILLS OR SWEATING . *NAUSEA AND VOMITING THAT IS NOT CONTROLLED WITH YOUR NAUSEA MEDICATION . *UNUSUAL SHORTNESS OF BREATH . *UNUSUAL BRUISING OR BLEEDING . *URINARY PROBLEMS (pain or burning when urinating, or frequent urination) . *BOWEL PROBLEMS (unusual diarrhea, constipation, pain near the anus) . TENDERNESS IN MOUTH AND THROAT WITH OR WITHOUT PRESENCE OF ULCERS (sore throat, sores in mouth, or a toothache) . UNUSUAL RASH, SWELLING OR PAIN  . UNUSUAL VAGINAL DISCHARGE OR ITCHING   Items with * indicate a potential emergency and should be followed up as soon as possible or go to the Emergency Department if any problems should occur.  Please show the CHEMOTHERAPY ALERT CARD or IMMUNOTHERAPY ALERT  CARD at check-in to the Emergency Department and triage nurse.  Should you have questions after your visit or need to cancel or reschedule your appointment, please contact [facility]  [phone] and follow the prompts.  Office hours are 8:00 a.m. to 4:30 p.m. Monday - Friday. Please note that voicemails left after 4:00 p.m. may not be returned until the following business day.  We are closed weekends and major holidays. You have access to a nurse at all times for urgent questions. Please call the main number to the clinic [phone] and follow the prompts.  For any non-urgent questions, you may also contact your provider using MyChart. We now offer e-Visits for anyone 18 and older to request care online for non-urgent symptoms. For details visit [email or web address].   Also download the MyChart app! Go to the app store, search "MyChart", open the app, select [facility], and log in with your MyChart username and password.  Due to Covid, a mask is required upon entering the hospital/clinic. If you do not have a mask, one will be given to you upon arrival. For doctor visits, patients may have 1 support person aged 18 or older with them. For treatment visits, patients cannot have anyone with them due to current Covid guidelines and our immunocompromised population.  

## 2021-02-19 NOTE — Telephone Encounter (Signed)
Nicki daughter called asking if Dr Janese Banks would refill the sleep medicine hospitalist started patient on and if the prior auth for [place] can be done and send prescription to [facility]

## 2021-02-20 ENCOUNTER — Encounter: Payer: Self-pay | Admitting: Oncology

## 2021-02-20 NOTE — Telephone Encounter (Signed)
Phoned patient and informed her that authorization for Magic Mouthwash was denied by her insurance and it will not be covered. Pt stated that she was not having any oral pain or ulcerations at this time, and did not need the medication at the moment. I informed her that if symptoms reoccur to call us and we would figure out something else. I also informed patient that she could call her insurance company for an appeal if she felt necessary. Pt verbalized understanding.

## 2021-02-22 ENCOUNTER — Encounter: Payer: Self-pay | Admitting: Oncology

## 2021-02-24 ENCOUNTER — Encounter: Payer: Self-pay | Admitting: Hospice and Palliative Medicine

## 2021-02-24 ENCOUNTER — Other Ambulatory Visit: Payer: Self-pay

## 2021-02-24 ENCOUNTER — Inpatient Hospital Stay: Payer: Medicare Other | Attending: Oncology

## 2021-02-24 ENCOUNTER — Inpatient Hospital Stay (HOSPITAL_BASED_OUTPATIENT_CLINIC_OR_DEPARTMENT_OTHER): Payer: Medicare Other | Admitting: Hospice and Palliative Medicine

## 2021-02-24 ENCOUNTER — Inpatient Hospital Stay: Payer: Medicare Other

## 2021-02-24 ENCOUNTER — Other Ambulatory Visit: Payer: Self-pay | Admitting: Oncology

## 2021-02-24 VITALS — BP 166/56 | HR 71 | Temp 96.2°F | Resp 20 | Wt 145.0 lb

## 2021-02-24 DIAGNOSIS — Z87891 Personal history of nicotine dependence: Secondary | ICD-10-CM | POA: Diagnosis not present

## 2021-02-24 DIAGNOSIS — D72819 Decreased white blood cell count, unspecified: Secondary | ICD-10-CM | POA: Diagnosis not present

## 2021-02-24 DIAGNOSIS — C7931 Secondary malignant neoplasm of brain: Secondary | ICD-10-CM | POA: Insufficient documentation

## 2021-02-24 DIAGNOSIS — Z79899 Other long term (current) drug therapy: Secondary | ICD-10-CM | POA: Diagnosis not present

## 2021-02-24 DIAGNOSIS — Z885 Allergy status to narcotic agent status: Secondary | ICD-10-CM | POA: Diagnosis not present

## 2021-02-24 DIAGNOSIS — Z5189 Encounter for other specified aftercare: Secondary | ICD-10-CM | POA: Insufficient documentation

## 2021-02-24 DIAGNOSIS — C3432 Malignant neoplasm of lower lobe, left bronchus or lung: Secondary | ICD-10-CM | POA: Diagnosis present

## 2021-02-24 DIAGNOSIS — Z5112 Encounter for antineoplastic immunotherapy: Secondary | ICD-10-CM | POA: Diagnosis present

## 2021-02-24 DIAGNOSIS — T451X5A Adverse effect of antineoplastic and immunosuppressive drugs, initial encounter: Secondary | ICD-10-CM | POA: Diagnosis not present

## 2021-02-24 DIAGNOSIS — R110 Nausea: Secondary | ICD-10-CM | POA: Diagnosis not present

## 2021-02-24 DIAGNOSIS — C3490 Malignant neoplasm of unspecified part of unspecified bronchus or lung: Secondary | ICD-10-CM

## 2021-02-24 DIAGNOSIS — R531 Weakness: Secondary | ICD-10-CM | POA: Diagnosis not present

## 2021-02-24 DIAGNOSIS — D6481 Anemia due to antineoplastic chemotherapy: Secondary | ICD-10-CM | POA: Diagnosis not present

## 2021-02-24 DIAGNOSIS — Z5111 Encounter for antineoplastic chemotherapy: Secondary | ICD-10-CM | POA: Insufficient documentation

## 2021-02-24 DIAGNOSIS — Z882 Allergy status to sulfonamides status: Secondary | ICD-10-CM | POA: Insufficient documentation

## 2021-02-24 DIAGNOSIS — Z880 Allergy status to penicillin: Secondary | ICD-10-CM | POA: Insufficient documentation

## 2021-02-24 DIAGNOSIS — R062 Wheezing: Secondary | ICD-10-CM | POA: Diagnosis not present

## 2021-02-24 DIAGNOSIS — D649 Anemia, unspecified: Secondary | ICD-10-CM

## 2021-02-24 DIAGNOSIS — Z881 Allergy status to other antibiotic agents status: Secondary | ICD-10-CM | POA: Insufficient documentation

## 2021-02-24 DIAGNOSIS — Z8249 Family history of ischemic heart disease and other diseases of the circulatory system: Secondary | ICD-10-CM | POA: Insufficient documentation

## 2021-02-24 DIAGNOSIS — Z801 Family history of malignant neoplasm of trachea, bronchus and lung: Secondary | ICD-10-CM | POA: Insufficient documentation

## 2021-02-24 DIAGNOSIS — R600 Localized edema: Secondary | ICD-10-CM | POA: Diagnosis not present

## 2021-02-24 DIAGNOSIS — Z888 Allergy status to other drugs, medicaments and biological substances status: Secondary | ICD-10-CM | POA: Insufficient documentation

## 2021-02-24 DIAGNOSIS — Z8349 Family history of other endocrine, nutritional and metabolic diseases: Secondary | ICD-10-CM | POA: Insufficient documentation

## 2021-02-24 DIAGNOSIS — C7951 Secondary malignant neoplasm of bone: Secondary | ICD-10-CM | POA: Insufficient documentation

## 2021-02-24 DIAGNOSIS — R5383 Other fatigue: Secondary | ICD-10-CM | POA: Insufficient documentation

## 2021-02-24 DIAGNOSIS — Z8719 Personal history of other diseases of the digestive system: Secondary | ICD-10-CM | POA: Diagnosis not present

## 2021-02-24 DIAGNOSIS — Z8049 Family history of malignant neoplasm of other genital organs: Secondary | ICD-10-CM | POA: Insufficient documentation

## 2021-02-24 DIAGNOSIS — Z818 Family history of other mental and behavioral disorders: Secondary | ICD-10-CM | POA: Insufficient documentation

## 2021-02-24 DIAGNOSIS — Z923 Personal history of irradiation: Secondary | ICD-10-CM | POA: Insufficient documentation

## 2021-02-24 LAB — CBC WITH DIFFERENTIAL/PLATELET
Abs Immature Granulocytes: 0.08 10*3/uL — ABNORMAL HIGH (ref 0.00–0.07)
Basophils Absolute: 0.1 10*3/uL (ref 0.0–0.1)
Basophils Relative: 2 %
Eosinophils Absolute: 0.2 10*3/uL (ref 0.0–0.5)
Eosinophils Relative: 5 %
HCT: 23.1 % — ABNORMAL LOW (ref 36.0–46.0)
Hemoglobin: 7.8 g/dL — ABNORMAL LOW (ref 12.0–15.0)
Immature Granulocytes: 2 %
Lymphocytes Relative: 17 %
Lymphs Abs: 0.6 10*3/uL — ABNORMAL LOW (ref 0.7–4.0)
MCH: 32.4 pg (ref 26.0–34.0)
MCHC: 33.8 g/dL (ref 30.0–36.0)
MCV: 95.9 fL (ref 80.0–100.0)
Monocytes Absolute: 0.1 10*3/uL (ref 0.1–1.0)
Monocytes Relative: 2 %
Neutro Abs: 2.6 10*3/uL (ref 1.7–7.7)
Neutrophils Relative %: 72 %
Platelets: 80 10*3/uL — ABNORMAL LOW (ref 150–400)
RBC: 2.41 MIL/uL — ABNORMAL LOW (ref 3.87–5.11)
RDW: 18.6 % — ABNORMAL HIGH (ref 11.5–15.5)
Smear Review: NORMAL
WBC: 3.6 10*3/uL — ABNORMAL LOW (ref 4.0–10.5)
nRBC: 0 % (ref 0.0–0.2)

## 2021-02-24 LAB — COMPREHENSIVE METABOLIC PANEL
ALT: 20 U/L (ref 0–44)
AST: 23 U/L (ref 15–41)
Albumin: 3 g/dL — ABNORMAL LOW (ref 3.5–5.0)
Alkaline Phosphatase: 71 U/L (ref 38–126)
Anion gap: 7 (ref 5–15)
BUN: 14 mg/dL (ref 8–23)
CO2: 24 mmol/L (ref 22–32)
Calcium: 8.3 mg/dL — ABNORMAL LOW (ref 8.9–10.3)
Chloride: 99 mmol/L (ref 98–111)
Creatinine, Ser: 0.78 mg/dL (ref 0.44–1.00)
GFR, Estimated: >60 mL/min (ref >60)
Glucose, Bld: 115 mg/dL — ABNORMAL HIGH (ref 70–99)
Potassium: 3.8 mmol/L (ref 3.5–5.1)
Sodium: 130 mmol/L — ABNORMAL LOW (ref 135–145)
Total Bilirubin: 1 mg/dL (ref 0.3–1.2)
Total Protein: 5.7 g/dL — ABNORMAL LOW (ref 6.5–8.1)

## 2021-02-24 LAB — PREPARE RBC (CROSSMATCH)

## 2021-02-24 MED ORDER — HEPARIN SOD (PORK) LOCK FLUSH 100 UNIT/ML IV SOLN
500.0000 [IU] | Freq: Every day | INTRAVENOUS | Status: AC | PRN
Start: 2021-02-24 — End: 2021-02-24
  Administered 2021-02-24: 500 [IU]
  Filled 2021-02-24: qty 5

## 2021-02-24 MED ORDER — HEPARIN SOD (PORK) LOCK FLUSH 100 UNIT/ML IV SOLN
INTRAVENOUS | Status: AC
  Filled 2021-02-24: qty 5

## 2021-02-24 MED ORDER — SODIUM CHLORIDE 0.9% IV SOLUTION
250.0000 mL | Freq: Once | INTRAVENOUS | Status: AC
  Administered 2021-02-24: 250 mL via INTRAVENOUS
  Filled 2021-02-24: qty 250

## 2021-02-24 MED ORDER — QUETIAPINE FUMARATE 25 MG PO TABS: 25.0000 mg | ORAL_TABLET | Freq: Every day | ORAL | 2 refills | Status: DC

## 2021-02-24 MED ORDER — ACETAMINOPHEN 325 MG PO TABS
650.0000 mg | ORAL_TABLET | Freq: Once | ORAL | Status: AC
  Administered 2021-02-24: 650 mg via ORAL
  Filled 2021-02-24: qty 2

## 2021-02-24 NOTE — Telephone Encounter (Signed)
[place] to renew quetiapine 25 mg. Can you look into MMW?

## 2021-02-24 NOTE — Progress Notes (Signed)
Symptom Management [place]  Telephone:([phone] [phone]  Patient Care Team: Idelle Crouch, MD as PCP - General (Internal Medicine) Telford Nab, RN as Oncology Nurse Navigator Earlie Server, MD as Consulting Physician (Hematology and Oncology)   Name of the patient: Beth Mcmillan  [id number]  1946/05/22   Date of visit: 02/24/21  Reason for Consult:  Ms. 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  Patient was recently hospitalized 02/02/2021-02/05/2021 for suspected sepsis, although a clear source of infection was never identified.  She was again hospitalized 02/07/2021-02/08/2021 with altered mental status thought secondary to delirium from lack of sleep.  This resolved quickly with use of as needed Ativan.  Patient was also treated for right lower extremity cellulitis.  Patient was discharged home on Augmentin.  Patient saw Dr. Janese Banks on 02/17/2021 for cycle 3 carbo/etoposide/Tecentriq chemotherapy.  Patient scheduled for follow-up in [facility] today to recheck labs and consider possible transfusion.  Today, she denies any neurologic complaints. Denies recent fevers or illnesses. Denies any easy bleeding or bruising. Reports poor appetite but denies weight loss. Denies chest pain. Denies any nausea, vomiting, constipation, or diarrhea. Denies urinary complaints. Patient offers no further specific complaints today.  PAST MEDICAL HISTORY: Past Medical History:  Diagnosis Date   Anxiety    Breathing problem    low breathing function 53%   COPD (chronic obstructive pulmonary disease) (HCC)    EMPHYSEMA   Dysrhythmia    tachycardia   Emphysema of lung (HCC)    Hemorrhoid    History of hiatal hernia    SMALL- 2022   Hypercholesteremia    Hypertension    Hypothyroidism    Larynx polyp    Lung  cancer (HCC)    Palpitations    with anxiety   Platelets decreased ([place]) 2020   Seasonal allergies    Tinnitus    Vertigo    no episodes in several years   Wears dentures    partial lower   White coat syndrome with hypertension     PAST SURGICAL HISTORY:  Past Surgical History:  Procedure Laterality Date   ABDOMINAL HYSTERECTOMY     partial   COLONOSCOPY WITH PROPOFOL N/A 01/22/2016   Procedure: COLONOSCOPY WITH PROPOFOL;  Surgeon: Lucilla Lame, MD;  Location: [place];  Service: Endoscopy;  Laterality: N/A;   COLONOSCOPY WITH PROPOFOL N/A 04/12/2019   Procedure: COLONOSCOPY WITH BIOPSY;  Surgeon: Lucilla Lame, MD;  Location: [place];  Service: Endoscopy;  Laterality: N/A;  pt would like an early appt   ct guided lung bx     IR IMAGING GUIDED PORT INSERTION  12/18/2020   POLYPECTOMY  01/22/2016   Procedure: POLYPECTOMY;  Surgeon: Lucilla Lame, MD;  Location: [place];  Service: Endoscopy;;   POLYPECTOMY N/A 04/12/2019   Procedure: POLYPECTOMY;  Surgeon: Lucilla Lame, MD;  Location: [place];  Service: Endoscopy;  Laterality: N/A;   VIDEO BRONCHOSCOPY WITH ENDOBRONCHIAL NAVIGATION N/A 11/21/2020   Procedure: VIDEO BRONCHOSCOPY WITH ENDOBRONCHIAL NAVIGATION;  Surgeon: Ottie Glazier, MD;  Location: [facility];  Service: Thoracic;  Laterality: N/A;   VIDEO BRONCHOSCOPY WITH ENDOBRONCHIAL ULTRASOUND N/A 11/21/2020   Procedure: VIDEO BRONCHOSCOPY WITH ENDOBRONCHIAL ULTRASOUND;  Surgeon: Ottie Glazier, MD;  Location: [facility];  Service: Thoracic;  Laterality: N/A;    HEMATOLOGY/ONCOLOGY HISTORY:  Oncology History  Small  cell lung cancer in adult [facility])  12/13/2020 Initial Diagnosis   Small cell lung cancer in adult [facility])   12/22/2020 -  Chemotherapy    Patient is on Treatment Plan: LUNG SCLC CARBOPLATIN + ETOPOSIDE + ATEZOLIZUMAB INDUCTION Q21D / ATEZOLIZUMAB MAINTENANCE Q21D      12/22/2020 Cancer Staging   Staging form: Lung, AJCC 8th Edition -  Clinical stage from 12/22/2020: Stage IV (cT2, cN0, cM1) - Signed by Sindy Guadeloupe, MD on 12/22/2020     ALLERGIES:  is allergic to augmentin [amoxicillin-pot clavulanate], codeine, levaquin [levofloxacin], and tape.  MEDICATIONS:  Current Outpatient Medications  Medication Sig Dispense Refill   acetaminophen (TYLENOL) 500 MG tablet Take 500 mg by mouth every 6 (six) hours as needed for mild pain or moderate pain.     atorvastatin (LIPITOR) 10 MG tablet TAKE 1 TABLET BY MOUTH DAILY 90 tablet 0   doxycycline (VIBRAMYCIN) 100 MG capsule Take 100 mg by mouth 2 (two) times daily.     Fluticasone-Umeclidin-Vilant (TRELEGY ELLIPTA) 100-62.5-25 MCG/INH AEPB Inhale into the lungs.     ketotifen (ZADITOR) 0.025 % ophthalmic solution Place 1 drop into both eyes 2 (two) times daily as needed.     levothyroxine (SYNTHROID, LEVOTHROID) 50 MCG tablet Take 50 mcg by mouth daily before breakfast.     loratadine (CLARITIN) 10 MG tablet Take 10 mg by mouth daily. Reported on 01/22/2016     LORazepam (ATIVAN) 0.5 MG tablet Take 1 tablet (0.5 mg total) by mouth every 6 (six) hours as needed for anxiety. 120 tablet 0   losartan-hydrochlorothiazide (HYZAAR) 100-25 MG tablet Take 1 tablet by mouth daily.     magic mouthwash w/lidocaine SOLN Take 5 mLs by mouth 4 (four) times daily as needed for mouth pain. 240 mL 0   magnesium chloride (SLOW-MAG) 64 MG TBEC SR tablet Take 1 tablet (64 mg total) by mouth daily. 30 tablet 0   metoprolol succinate (TOPROL-XL) 50 MG 24 hr tablet Take 50 mg by mouth daily.     nystatin (MYCOSTATIN) 100000 UNIT/ML suspension SHAKE LIQUID AND TAKE 5 ML BY MOUTH FOUR TIMES DAILY 60 mL 0   ondansetron (ZOFRAN) 8 MG tablet Take 8 mg by mouth 2 (two) times daily as needed for nausea or vomiting.     PARoxetine (PAXIL) 10 MG tablet Take 5 mg by mouth every evening.     polyethylene glycol (MIRALAX / GLYCOLAX) 17 g packet Take 17 g by mouth daily as needed.     potassium chloride SA (KLOR-CON) 20  MEQ tablet Take 1 tablet (20 mEq total) by mouth daily. 28 tablet 0   prochlorperazine (COMPAZINE) 10 MG tablet Take 10 mg by mouth as needed for nausea or vomiting.     QUEtiapine (SEROQUEL) 25 MG tablet Take 1 tablet (25 mg total) by mouth at bedtime. 14 tablet 0   sodium chloride 1 g tablet Take 1 tablet (1 g total) by mouth 3 (three) times daily. 42 tablet 0   No current facility-administered medications for this visit.   Facility-Administered Medications Ordered in Other Visits  Medication Dose Route Frequency Provider Last Rate Last Admin   0.9 %  sodium chloride infusion (Manually program via Guardrails IV Fluids)  250 mL Intravenous Once Sindy Guadeloupe, MD       acetaminophen (TYLENOL) tablet 650 mg  650 mg Oral Once Sindy Guadeloupe, MD       heparin lock flush 100 unit/mL  500 Units Intracatheter Daily PRN Randa Evens  C, MD        VITAL SIGNS: BP (!) 166/56   Pulse 71   Temp (!) 96.2 F (35.7 C) (Tympanic)   Resp 20   Wt 145 lb (65.8 kg)   SpO2 100%   BMI 22.71 kg/m  Filed Weights   02/24/21 0853  Weight: 145 lb (65.8 kg)    Estimated body mass index is 22.71 kg/m as calculated from the following:   Height as of 02/07/21: 5\' 7"  (1.702 m).   Weight as of this encounter: 145 lb (65.8 kg).  LABS: CBC:    Component Value Date/Time   WBC 3.6 (L) 02/24/2021 0804   HGB 7.8 (L) 02/24/2021 0804   HGB 13.8 06/05/2013 1127   HCT 23.1 (L) 02/24/2021 0804   HCT 40.0 06/05/2013 1127   PLT 80 (L) 02/24/2021 0804   PLT 303 06/05/2013 1127   MCV 95.9 02/24/2021 0804   MCV 92 06/05/2013 1127   NEUTROABS 2.6 02/24/2021 0804   LYMPHSABS 0.6 (L) 02/24/2021 0804   MONOABS 0.1 02/24/2021 0804   EOSABS 0.2 02/24/2021 0804   BASOSABS 0.1 02/24/2021 0804   Comprehensive Metabolic Panel:    Component Value Date/Time   NA 130 (L) 02/24/2021 0804   NA 131 (L) 06/05/2013 1127   K 3.8 02/24/2021 0804   K 3.0 (L) 06/05/2013 1127   CL 99 02/24/2021 0804   CL 96 (L) 06/05/2013 1127    CO2 24 02/24/2021 0804   CO2 26 06/05/2013 1127   BUN 14 02/24/2021 0804   BUN 10 06/05/2013 1127   CREATININE 0.78 02/24/2021 0804   CREATININE 0.94 06/05/2013 1127   GLUCOSE 115 (H) 02/24/2021 0804   GLUCOSE 127 (H) 06/05/2013 1127   CALCIUM 8.3 (L) 02/24/2021 0804   CALCIUM 9.1 06/05/2013 1127   AST 23 02/24/2021 0804   ALT 20 02/24/2021 0804   ALKPHOS 71 02/24/2021 0804   BILITOT 1.0 02/24/2021 0804   PROT 5.7 (L) 02/24/2021 0804   ALBUMIN 3.0 (L) 02/24/2021 0804    RADIOGRAPHIC STUDIES: DG Chest 2 View  Result Date: 02/07/2021 CLINICAL DATA:  Sepsis, COPD EXAM: CHEST - 2 VIEW COMPARISON:  02/02/2021, 11/19/2020 FINDINGS: Right-sided chest port terminates at the level of the superior cavoatrial junction. Heart size is normal. Atherosclerotic calcification of the aortic knob. 1.5 cm left lower lobe lung nodule, similar in size to the recent prior chest x-ray. Lungs are hyperinflated and appear otherwise clear. No pleural effusion or pneumothorax. IMPRESSION: No acute cardiopulmonary abnormality. Electronically Signed   By: Davina Poke D.O.   On: 02/07/2021 13:32   DG Chest 2 View  Result Date: 01/28/2021 CLINICAL DATA:  Weakness. EXAM: CHEST - 2 VIEW COMPARISON:  None. FINDINGS: A right-sided venous Port-A-Cath is seen with its distal tip noted at the junction of the superior vena cava and right atrium. The lungs are hyperinflated. A 1.3 cm x 1.5 cm opacity is seen overlying the left lung base on the frontal view. Mild left basilar linear scarring and/or atelectasis is noted. The heart size and mediastinal contours are within normal limits. The visualized skeletal structures are unremarkable. IMPRESSION: 1. Mild left basilar linear scarring and/or atelectasis. 2. Focal opacity overlying the left lung base which may represent an overlying nipple shadow versus residual left basilar lung mass. Electronically Signed   By: Virgina Norfolk M.D.   On: 01/28/2021 21:37   CT Head Wo  Contrast  Result Date: 02/07/2021 CLINICAL DATA:  Patient with confusion. EXAM: CT HEAD  WITHOUT CONTRAST TECHNIQUE: Contiguous axial images were obtained from the base of the skull through the vertex without intravenous contrast. COMPARISON:  Brain CT 02/03/2021. FINDINGS: Brain: Ventricles and sulci are appropriate for patient's age. No evidence for acute cortically based infarct, intracranial hemorrhage, mass lesion or mass-effect. Vascular: Unremarkable. Skull: Intact. Sinuses/Orbits: Paranasal sinuses well aerated. Mastoid air cells are unremarkable. Orbits are unremarkable. Other: None. IMPRESSION: No acute intracranial process. Electronically Signed   By: Lovey Newcomer M.D.   On: 02/07/2021 13:18   CT Head W or Wo Contrast  Result Date: 02/03/2021 CLINICAL DATA:  Encephalopathy. EXAM: CT HEAD WITHOUT AND WITH CONTRAST TECHNIQUE: Contiguous axial images were obtained from the base of the skull through the vertex without and with intravenous contrast CONTRAST:  25mL OMNIPAQUE IOHEXOL 300 MG/ML  SOLN COMPARISON:  06/05/2013 FINDINGS: Brain: There is no mass, hemorrhage or extra-axial collection. The size and configuration of the ventricles and extra-axial CSF spaces are normal. The brain parenchyma is normal, without acute or chronic infarction. No abnormal contrast enhancement. Vascular: No abnormal hyperdensity of the major intracranial arteries or dural venous sinuses. No intracranial atherosclerosis. Skull: The visualized skull base, calvarium and extracranial soft tissues are normal. Sinuses/Orbits: No fluid levels or advanced mucosal thickening of the visualized paranasal sinuses. No mastoid or middle ear effusion. The orbits are normal. IMPRESSION: Normal head CT. Electronically Signed   By: Ulyses Jarred M.D.   On: 02/03/2021 01:05   MR BRAIN W WO CONTRAST  Result Date: 02/07/2021 CLINICAL DATA:  Encephalopathy. History of small cell lung carcinoma with metastases to bone. EXAM: MRI HEAD WITHOUT  AND WITH CONTRAST TECHNIQUE: Multiplanar, multiecho pulse sequences of the brain and surrounding structures were obtained without and with intravenous contrast. CONTRAST:  7.26mL GADAVIST GADOBUTROL 1 MMOL/ML IV SOLN COMPARISON:  12/24/2020 FINDINGS: Brain: No acute infarct, mass effect or extra-axial collection. Single focus of high right convexity chronic microhemorrhage. There is multifocal hyperintense T2-weighted signal within the white matter. Parenchymal volume and CSF spaces are normal. The midline structures are normal. Punctate contrast enhancing lesion in the posterior right parietal lobe measuring 3 mm (series 21, image 9). No other contrast-enhancing lesions. Vascular: Major flow voids are preserved. Skull and upper cervical spine: Normal calvarium and skull base. Visualized upper cervical spine and soft tissues are normal. Sinuses/Orbits:No paranasal sinus fluid levels or advanced mucosal thickening. No mastoid or middle ear effusion. Normal orbits. IMPRESSION: 1. Posterior right parietal lobe metastasis measuring 3 mm. 2. Findings of chronic microvascular ischemia. Electronically Signed   By: Ulyses Jarred M.D.   On: 02/07/2021 22:10   US Venous Img Lower Bilateral (DVT)  Result Date: 02/03/2021 CLINICAL DATA:  75 year old female with a history bilateral lower extremity pain and swelling EXAM: BILATERAL LOWER EXTREMITY VENOUS DOPPLER ULTRASOUND TECHNIQUE: Gray-scale sonography with graded compression, as well as color Doppler and duplex ultrasound were performed to evaluate the lower extremity deep venous systems from the level of the common femoral vein and including the common femoral, femoral, profunda femoral, popliteal and calf veins including the posterior tibial, peroneal and gastrocnemius veins when visible. The superficial great saphenous vein was also interrogated. Spectral Doppler was utilized to evaluate flow at rest and with distal augmentation maneuvers in the common femoral, femoral  and popliteal veins. COMPARISON:  None. FINDINGS: RIGHT LOWER EXTREMITY Common Femoral Vein: No evidence of thrombus. Normal compressibility, respiratory phasicity and response to augmentation. Saphenofemoral Junction: No evidence of thrombus. Normal compressibility and flow on color Doppler imaging. Profunda Femoral Vein:  No evidence of thrombus. Normal compressibility and flow on color Doppler imaging. Femoral Vein: No evidence of thrombus. Normal compressibility, respiratory phasicity and response to augmentation. Popliteal Vein: No evidence of thrombus. Normal compressibility, respiratory phasicity and response to augmentation. Calf Veins: No evidence of thrombus. Normal compressibility and flow on color Doppler imaging. Superficial Great Saphenous Vein: No evidence of thrombus. Normal compressibility and flow on color Doppler imaging. Other Findings:  None. LEFT LOWER EXTREMITY Common Femoral Vein: No evidence of thrombus. Normal compressibility, respiratory phasicity and response to augmentation. Saphenofemoral Junction: No evidence of thrombus. Normal compressibility and flow on color Doppler imaging. Profunda Femoral Vein: No evidence of thrombus. Normal compressibility and flow on color Doppler imaging. Femoral Vein: No evidence of thrombus. Normal compressibility, respiratory phasicity and response to augmentation. Popliteal Vein: No evidence of thrombus. Normal compressibility, respiratory phasicity and response to augmentation. Calf Veins: No evidence of thrombus. Normal compressibility and flow on color Doppler imaging. Superficial Great Saphenous Vein: No evidence of thrombus. Normal compressibility and flow on color Doppler imaging. Other Findings:  None. IMPRESSION: Sonographic survey of the bilateral lower extremities negative for DVT Electronically Signed   By: Corrie Mckusick D.O.   On: 02/03/2021 11:38   DG Chest Port 1 View  Result Date: 02/02/2021 CLINICAL DATA:  Questionable sepsis EXAM:  PORTABLE CHEST 1 VIEW COMPARISON:  01/28/2021 FINDINGS: Right Port-A-Cath remains in place, unchanged. There is hyperinflation of the lungs compatible with COPD. Heart is normal size. Aortic calcifications. No confluent opacities or effusions. No acute bony abnormality. IMPRESSION: COPD.  No active disease. Electronically Signed   By: Rolm Baptise M.D.   On: 02/02/2021 23:33    PERFORMANCE STATUS (ECOG) : 2 - Symptomatic, <50% confined to bed  Review of Systems Unless otherwise noted, a complete review of systems is negative.  Physical Exam General: NAD Cardiovascular: regular rate and rhythm Pulmonary: clear ant/posterior fields Abdomen: soft, nontender, + bowel sounds GU: no suprapubic tenderness Extremities: No lower extremity edema, no joint deformities Skin: Small skin tear to right lower extremity without warmth or redness.  Area is scabbed Neurological: Weakness but otherwise nonfocal   Assessment and Plan- Patient is a 75 y.o. 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  Extensive stage small cell lung cancer  - patient reports that she is feeling good today.  She denies any significant changes or concerns. Patient will follow up with Dr. Janese Banks in 2 weeks for consideration of chemotherapy.  Will benefit from palliative care involvement to discuss goals/ACP  Anemia-hemoglobin dropped to 7.8.  Unclear if yet at nadir.  Discussed with Dr. Janese Banks and given patient's poor reserve, will proceed with transfusion 1 unit packed red blood cells today.  Orders placed by Dr. Janese Banks.   Poor oral intake -followed by nutrition.  Patient is pushing oral fluids but mostly subsisting on Ensures.  Case and plan discussed with Dr. Janese Banks  Patient expressed understanding and was in agreement with this plan. She also understands that She can call  clinic at any time with any questions, concerns, or complaints.   Thank you for allowing me to participate in the care of this very pleasant patient.   Time Total: 15 minutes  Visit consisted of counseling and education dealing with the complex and emotionally intense issues of symptom management and palliative care in the setting of serious and  potentially life-threatening illness.Greater than 50%  of this time was spent counseling and coordinating care related to the above assessment and plan.  Signed by: Altha Harm, PhD, NP-C

## 2021-02-24 NOTE — Patient Instructions (Signed)
[facility]  Discharge Instructions: Thank you for choosing [facility] to provide your oncology and hematology care.  If you have a lab appointment with the [facility], please go directly to the [facility] and check in at the registration area.  Wear comfortable clothing and clothing appropriate for easy access to any Portacath or PICC line.   We strive to give you quality time with your provider. You may need to reschedule your appointment if you arrive late (15 or more minutes).  Arriving late affects you and other patients whose appointments are after yours.  Also, if you miss three or more appointments without notifying the office, you may be dismissed from the clinic at the provider's discretion.      For prescription refill requests, have your pharmacy contact our office and allow 72 hours for refills to be completed.    Today you received the following : Blood transfusion   To help prevent nausea and vomiting after your treatment, we encourage you to take your nausea medication as directed.  BELOW ARE SYMPTOMS THAT SHOULD BE REPORTED IMMEDIATELY: *FEVER GREATER THAN 100.4 F (38 C) OR HIGHER *CHILLS OR SWEATING *NAUSEA AND VOMITING THAT IS NOT CONTROLLED WITH YOUR NAUSEA MEDICATION *UNUSUAL SHORTNESS OF BREATH *UNUSUAL BRUISING OR BLEEDING *URINARY PROBLEMS (pain or burning when urinating, or frequent urination) *BOWEL PROBLEMS (unusual diarrhea, constipation, pain near the anus) TENDERNESS IN MOUTH AND THROAT WITH OR WITHOUT PRESENCE OF ULCERS (sore throat, sores in mouth, or a toothache) UNUSUAL RASH, SWELLING OR PAIN  UNUSUAL VAGINAL DISCHARGE OR ITCHING   Items with * indicate a potential emergency and should be followed up as soon as possible or go to the Emergency Department if any problems should occur.  Please show the CHEMOTHERAPY ALERT CARD or IMMUNOTHERAPY ALERT CARD at check-in to the Emergency Department and  triage nurse.  Blood Transfusion, Adult, Care After This sheet gives you information about how to care for yourself after your procedure. Your doctor may also give you more specific instructions. If youhave problems or questions, contact your doctor. What can I expect after the procedure? After the procedure, it is common to have: Bruising and soreness at the IV site. A fever or chills on the day of the procedure. This may be your body's response to the new blood cells received. A headache. Follow these instructions at home: Insertion site care     Follow instructions from your doctor about how to take care of your insertion site. This is where an IV tube was put into your vein. Make sure you: Wash your hands with soap and water before and after you change your bandage (dressing). If you cannot use soap and water, use hand sanitizer. Change your bandage as told by your doctor. Check your insertion site every day for signs of infection. Check for: Redness, swelling, or pain. Bleeding from the site. Warmth. Pus or a bad smell. General instructions Take over-the-counter and prescription medicines only as told by your doctor. Rest as told by your doctor. Go back to your normal activities as told by your doctor. Keep all follow-up visits as told by your doctor. This is important. Contact a doctor if: You have itching or red, swollen areas of skin (hives). You feel worried or nervous (anxious). You feel weak after doing your normal activities. You have redness, swelling, warmth, or pain around the insertion site. You have blood coming from the insertion site, and the blood does not  stop with pressure. You have pus or a bad smell coming from the insertion site. Get help right away if: You have signs of a serious reaction. This may be coming from an allergy or the body's defense system (immune system). Signs include: Trouble breathing or shortness of breath. Swelling of the face or  feeling warm (flushed). Fever or chills. Head, chest, or back pain. Dark pee (urine) or blood in the pee. Widespread rash. Fast heartbeat. Feeling dizzy or light-headed. You may receive your blood transfusion in an outpatient setting. If so, youwill be told whom to contact to report any reactions. These symptoms may be an emergency. Do not wait to see if the symptoms will go away. Get medical help right away. Call your local emergency services (911 in the [place] Do not drive yourself to the hospital. Summary Bruising and soreness at the IV site are common. Check your insertion site every day for signs of infection. Rest as told by your doctor. Go back to your normal activities as told by your doctor. Get help right away if you have signs of a serious reaction. This information is not intended to replace advice given to you by your health care provider. Make sure you discuss any questions you have with your healthcare provider. Document Revised: 02/01/2019 Document Reviewed: 02/01/2019 Elsevier Patient Education  2022 [place].   Should you have questions after your visit or need to cancel or reschedule your appointment, please contact [facility]  [phone] and follow the prompts.  Office hours are 8:00 a.m. to 4:30 p.m. Monday - Friday. Please note that voicemails left after 4:00 p.m. may not be returned until the following business day.  We are closed weekends and major holidays. You have access to a nurse at all times for urgent questions. Please call the main number to the clinic [phone] and follow the prompts.  For any non-urgent questions, you may also contact your provider using MyChart. We now offer e-Visits for anyone 60 and older to request care online for non-urgent symptoms. For details visit [email or web address].   Also download the MyChart app! Go to the app store, search "MyChart", open the app, select [facility], and log in  with your MyChart username and password.  Due to Covid, a mask is required upon entering the hospital/clinic. If you do not have a mask, one will be given to you upon arrival. For doctor visits, patients may have 1 support person aged 17 or older with them. For treatment visits, patients cannot have anyone with them due to current Covid guidelines and our immunocompromised population.

## 2021-02-24 NOTE — Addendum Note (Signed)
Addended by: Altha Harm R on: 02/24/2021 12:58 PM   Modules accepted: Orders

## 2021-02-25 LAB — BPAM RBC
Blood Product Expiration Date: 202207092359
ISSUE DATE / TIME: 202207051146
Unit Type and Rh: 9500

## 2021-02-25 LAB — TYPE AND SCREEN
ABO/RH(D): O POS
Antibody Screen: NEGATIVE
Unit division: 0

## 2021-03-03 ENCOUNTER — Other Ambulatory Visit: Payer: Self-pay

## 2021-03-03 ENCOUNTER — Inpatient Hospital Stay: Payer: Medicare Other

## 2021-03-03 DIAGNOSIS — Z95828 Presence of other vascular implants and grafts: Secondary | ICD-10-CM

## 2021-03-03 DIAGNOSIS — Z5112 Encounter for antineoplastic immunotherapy: Secondary | ICD-10-CM | POA: Diagnosis not present

## 2021-03-03 DIAGNOSIS — C3490 Malignant neoplasm of unspecified part of unspecified bronchus or lung: Secondary | ICD-10-CM

## 2021-03-03 LAB — CBC WITH DIFFERENTIAL/PLATELET
Abs Immature Granulocytes: 7.59 10*3/uL — ABNORMAL HIGH (ref 0.00–0.07)
Basophils Absolute: 0 10*3/uL (ref 0.0–0.1)
Basophils Relative: 0 %
Eosinophils Absolute: 0 10*3/uL (ref 0.0–0.5)
Eosinophils Relative: 0 %
HCT: 27.4 % — ABNORMAL LOW (ref 36.0–46.0)
Hemoglobin: 9 g/dL — ABNORMAL LOW (ref 12.0–15.0)
Immature Granulocytes: 27 %
Lymphocytes Relative: 7 %
Lymphs Abs: 1.9 10*3/uL (ref 0.7–4.0)
MCH: 31.8 pg (ref 26.0–34.0)
MCHC: 32.8 g/dL (ref 30.0–36.0)
MCV: 96.8 fL (ref 80.0–100.0)
Monocytes Absolute: 1.5 10*3/uL — ABNORMAL HIGH (ref 0.1–1.0)
Monocytes Relative: 5 %
Neutro Abs: 17 10*3/uL — ABNORMAL HIGH (ref 1.7–7.7)
Neutrophils Relative %: 61 %
Platelets: 67 10*3/uL — ABNORMAL LOW (ref 150–400)
RBC: 2.83 MIL/uL — ABNORMAL LOW (ref 3.87–5.11)
RDW: 19.7 % — ABNORMAL HIGH (ref 11.5–15.5)
Smear Review: NORMAL
WBC: 28.1 10*3/uL — ABNORMAL HIGH (ref 4.0–10.5)
nRBC: 0.4 % — ABNORMAL HIGH (ref 0.0–0.2)

## 2021-03-03 LAB — COMPREHENSIVE METABOLIC PANEL
ALT: 20 U/L (ref 0–44)
AST: 25 U/L (ref 15–41)
Albumin: 3.2 g/dL — ABNORMAL LOW (ref 3.5–5.0)
Alkaline Phosphatase: 92 U/L (ref 38–126)
Anion gap: 9 (ref 5–15)
BUN: 8 mg/dL (ref 8–23)
CO2: 20 mmol/L — ABNORMAL LOW (ref 22–32)
Calcium: 8.3 mg/dL — ABNORMAL LOW (ref 8.9–10.3)
Chloride: 101 mmol/L (ref 98–111)
Creatinine, Ser: 0.77 mg/dL (ref 0.44–1.00)
GFR, Estimated: >60 mL/min (ref >60)
Glucose, Bld: 144 mg/dL — ABNORMAL HIGH (ref 70–99)
Potassium: 3.7 mmol/L (ref 3.5–5.1)
Sodium: 130 mmol/L — ABNORMAL LOW (ref 135–145)
Total Bilirubin: 0.2 mg/dL — ABNORMAL LOW (ref 0.3–1.2)
Total Protein: 5.9 g/dL — ABNORMAL LOW (ref 6.5–8.1)

## 2021-03-03 LAB — SAMPLE TO BLOOD BANK

## 2021-03-03 MED ORDER — HEPARIN SOD (PORK) LOCK FLUSH 100 UNIT/ML IV SOLN
INTRAVENOUS | Status: AC
  Filled 2021-03-03: qty 5

## 2021-03-03 MED ORDER — SODIUM CHLORIDE 0.9% FLUSH
10.0000 mL | Freq: Once | INTRAVENOUS | Status: AC
  Administered 2021-03-03: 10 mL via INTRAVENOUS
  Filled 2021-03-03: qty 10

## 2021-03-03 MED ORDER — HEPARIN SOD (PORK) LOCK FLUSH 100 UNIT/ML IV SOLN
500.0000 [IU] | Freq: Once | INTRAVENOUS | Status: AC
Start: 2021-03-03 — End: 2021-03-03
  Administered 2021-03-03: 500 [IU] via INTRAVENOUS
  Filled 2021-03-03: qty 5

## 2021-03-03 NOTE — Progress Notes (Signed)
Hgb 9.0, no transfusion today per Dr. Janese Banks. Patient has been hydrating well at home, no fluids needed. Discharged in stable condition.

## 2021-03-03 NOTE — Patient Instructions (Addendum)
[facility]  Discharge Instructions: Thank you for choosing [facility] to provide your oncology and hematology care.  If you have a lab appointment with the [facility], please go directly to the [facility] and check in at the registration area.  Wear comfortable clothing and clothing appropriate for easy access to any Portacath or PICC line.   We strive to give you quality time with your provider. You may need to reschedule your appointment if you arrive late (15 or more minutes).  Arriving late affects you and other patients whose appointments are after yours.  Also, if you miss three or more appointments without notifying the office, you may be dismissed from the clinic at the provider's discretion.      For prescription refill requests, have your pharmacy contact our office and allow 72 hours for refills to be completed.    Today you received the following chemotherapy and/or immunotherapy agents      To help prevent nausea and vomiting after your treatment, we encourage you to take your nausea medication as directed.  BELOW ARE SYMPTOMS THAT SHOULD BE REPORTED IMMEDIATELY: *FEVER GREATER THAN 100.4 F (38 C) OR HIGHER *CHILLS OR SWEATING *NAUSEA AND VOMITING THAT IS NOT CONTROLLED WITH YOUR NAUSEA MEDICATION *UNUSUAL SHORTNESS OF BREATH *UNUSUAL BRUISING OR BLEEDING *URINARY PROBLEMS (pain or burning when urinating, or frequent urination) *BOWEL PROBLEMS (unusual diarrhea, constipation, pain near the anus) TENDERNESS IN MOUTH AND THROAT WITH OR WITHOUT PRESENCE OF ULCERS (sore throat, sores in mouth, or a toothache) UNUSUAL RASH, SWELLING OR PAIN  UNUSUAL VAGINAL DISCHARGE OR ITCHING   Items with * indicate a potential emergency and should be followed up as soon as possible or go to the Emergency Department if any problems should occur.  Please show the CHEMOTHERAPY ALERT CARD or IMMUNOTHERAPY ALERT CARD at check-in to the  Emergency Department and triage nurse.  Should you have questions after your visit or need to cancel or reschedule your appointment, please contact [facility]  [phone] and follow the prompts.  Office hours are 8:00 a.m. to 4:30 p.m. Monday - Friday. Please note that voicemails left after 4:00 p.m. may not be returned until the following business day.  We are closed weekends and major holidays. You have access to a nurse at all times for urgent questions. Please call the main number to the clinic [phone] and follow the prompts.  For any non-urgent questions, you may also contact your provider using MyChart. We now offer e-Visits for anyone 70 and older to request care online for non-urgent symptoms. For details visit [email or web address].   Also download the MyChart app! Go to the app store, search "MyChart", open the app, select [facility], and log in with your MyChart username and password.  Due to Covid, a mask is required upon entering the hospital/clinic. If you do not have a mask, one will be given to you upon arrival. For doctor visits, patients may have 1 support person aged 35 or older with them. For treatment visits, patients cannot have anyone with them due to current Covid guidelines and our immunocompromised population.

## 2021-03-05 ENCOUNTER — Other Ambulatory Visit: Payer: Self-pay | Admitting: *Deleted

## 2021-03-05 ENCOUNTER — Telehealth: Payer: Self-pay | Admitting: *Deleted

## 2021-03-05 ENCOUNTER — Other Ambulatory Visit: Payer: Self-pay

## 2021-03-05 ENCOUNTER — Inpatient Hospital Stay: Payer: Medicare Other

## 2021-03-05 DIAGNOSIS — N23 Unspecified renal colic: Secondary | ICD-10-CM

## 2021-03-05 DIAGNOSIS — R350 Frequency of micturition: Secondary | ICD-10-CM

## 2021-03-05 DIAGNOSIS — Z5112 Encounter for antineoplastic immunotherapy: Secondary | ICD-10-CM | POA: Diagnosis not present

## 2021-03-05 LAB — URINALYSIS, COMPLETE (UACMP) WITH MICROSCOPIC
Bilirubin Urine: NEGATIVE
Glucose, UA: NEGATIVE mg/dL
Ketones, ur: NEGATIVE mg/dL
Nitrite: NEGATIVE
Protein, ur: NEGATIVE mg/dL
Specific Gravity, Urine: 1.002 — ABNORMAL LOW (ref 1.005–1.030)
Squamous Epithelial / HPF: NONE SEEN (ref 0–5)
WBC, UA: >50 WBC/hpf — ABNORMAL HIGH (ref 0–5)
pH: 6 (ref 5.0–8.0)

## 2021-03-05 MED ORDER — SULFAMETHOXAZOLE-TRIMETHOPRIM 800-160 MG PO TABS: 1.0000 | ORAL_TABLET | Freq: Two times a day (BID) | ORAL | 0 refills | Status: DC

## 2021-03-05 NOTE — Telephone Encounter (Signed)
Patient called to report that she is having symptoms of a UTI, burning with urination and frequency. She states that she is normally treated by PCP but would like Dr. Janese Banks to advise her.

## 2021-03-05 NOTE — Progress Notes (Signed)
ua

## 2021-03-05 NOTE — Telephone Encounter (Signed)
Come here and give [place] a sample of uA and urine cx

## 2021-03-06 ENCOUNTER — Encounter: Payer: Self-pay | Admitting: Oncology

## 2021-03-06 ENCOUNTER — Encounter: Payer: Self-pay | Admitting: *Deleted

## 2021-03-06 ENCOUNTER — Other Ambulatory Visit: Payer: Self-pay | Admitting: Internal Medicine

## 2021-03-06 ENCOUNTER — Other Ambulatory Visit: Payer: Self-pay | Admitting: Oncology

## 2021-03-06 LAB — URINE CULTURE: Culture: <10000 — AB

## 2021-03-06 MED ORDER — NITROFURANTOIN MONOHYD MACRO 100 MG PO CAPS: 100.0000 mg | ORAL_CAPSULE | Freq: Two times a day (BID) | ORAL | 0 refills | Status: DC

## 2021-03-06 NOTE — Progress Notes (Signed)
On 7/15-patient's daughter called regarding patient having nausea vomiting secondary to recent start of Bactrim antibiotic for possible UTI.  Prior history of similar intolerance.  Recommend Macrobid-100 mg twice daily [.  Patient/family no history of any allergies].  FYI-Dr.Yu.

## 2021-03-08 ENCOUNTER — Encounter: Payer: Self-pay | Admitting: *Deleted

## 2021-03-09 ENCOUNTER — Other Ambulatory Visit: Payer: Self-pay | Admitting: Oncology

## 2021-03-09 ENCOUNTER — Other Ambulatory Visit: Payer: Self-pay | Admitting: *Deleted

## 2021-03-09 ENCOUNTER — Encounter: Payer: Self-pay | Admitting: Oncology

## 2021-03-09 MED ORDER — METOCLOPRAMIDE HCL 10 MG PO TABS
10.0000 mg | ORAL_TABLET | Freq: Three times a day (TID) | ORAL | 0 refills | Status: DC
Start: 2021-03-09 — End: 2021-04-07

## 2021-03-10 ENCOUNTER — Other Ambulatory Visit: Payer: Self-pay

## 2021-03-10 ENCOUNTER — Inpatient Hospital Stay: Payer: Medicare Other

## 2021-03-10 ENCOUNTER — Other Ambulatory Visit: Payer: Self-pay | Admitting: *Deleted

## 2021-03-10 ENCOUNTER — Inpatient Hospital Stay (HOSPITAL_BASED_OUTPATIENT_CLINIC_OR_DEPARTMENT_OTHER): Payer: Medicare Other | Admitting: Oncology

## 2021-03-10 ENCOUNTER — Encounter: Payer: Self-pay | Admitting: Oncology

## 2021-03-10 ENCOUNTER — Inpatient Hospital Stay (HOSPITAL_BASED_OUTPATIENT_CLINIC_OR_DEPARTMENT_OTHER): Payer: Medicare Other | Admitting: Hospice and Palliative Medicine

## 2021-03-10 VITALS — BP 154/83 | HR 76 | Temp 97.8°F | Resp 18

## 2021-03-10 VITALS — BP 102/50 | HR 72 | Temp 98.3°F | Resp 16 | Ht 67.0 in | Wt 143.1 lb

## 2021-03-10 DIAGNOSIS — C7951 Secondary malignant neoplasm of bone: Secondary | ICD-10-CM

## 2021-03-10 DIAGNOSIS — R112 Nausea with vomiting, unspecified: Secondary | ICD-10-CM | POA: Diagnosis not present

## 2021-03-10 DIAGNOSIS — D6481 Anemia due to antineoplastic chemotherapy: Secondary | ICD-10-CM | POA: Diagnosis not present

## 2021-03-10 DIAGNOSIS — Z5112 Encounter for antineoplastic immunotherapy: Secondary | ICD-10-CM | POA: Diagnosis not present

## 2021-03-10 DIAGNOSIS — C3490 Malignant neoplasm of unspecified part of unspecified bronchus or lung: Secondary | ICD-10-CM

## 2021-03-10 DIAGNOSIS — Z5111 Encounter for antineoplastic chemotherapy: Secondary | ICD-10-CM

## 2021-03-10 DIAGNOSIS — Z515 Encounter for palliative care: Secondary | ICD-10-CM

## 2021-03-10 DIAGNOSIS — D8989 Other specified disorders involving the immune mechanism, not elsewhere classified: Secondary | ICD-10-CM

## 2021-03-10 DIAGNOSIS — T451X5A Adverse effect of antineoplastic and immunosuppressive drugs, initial encounter: Secondary | ICD-10-CM

## 2021-03-10 LAB — COMPREHENSIVE METABOLIC PANEL
ALT: 14 U/L (ref 0–44)
AST: 18 U/L (ref 15–41)
Albumin: 3.5 g/dL (ref 3.5–5.0)
Alkaline Phosphatase: 63 U/L (ref 38–126)
Anion gap: 8 (ref 5–15)
BUN: 14 mg/dL (ref 8–23)
CO2: 20 mmol/L — ABNORMAL LOW (ref 22–32)
Calcium: 8.5 mg/dL — ABNORMAL LOW (ref 8.9–10.3)
Chloride: 100 mmol/L (ref 98–111)
Creatinine, Ser: 0.89 mg/dL (ref 0.44–1.00)
GFR, Estimated: >60 mL/min (ref >60)
Glucose, Bld: 146 mg/dL — ABNORMAL HIGH (ref 70–99)
Potassium: 4.3 mmol/L (ref 3.5–5.1)
Sodium: 128 mmol/L — ABNORMAL LOW (ref 135–145)
Total Bilirubin: 0.4 mg/dL (ref 0.3–1.2)
Total Protein: 6.3 g/dL — ABNORMAL LOW (ref 6.5–8.1)

## 2021-03-10 LAB — CBC WITH DIFFERENTIAL/PLATELET
Abs Immature Granulocytes: 1.1 10*3/uL — ABNORMAL HIGH (ref 0.00–0.07)
Basophils Absolute: 0.1 10*3/uL (ref 0.0–0.1)
Basophils Relative: 1 %
Eosinophils Absolute: 0.1 10*3/uL (ref 0.0–0.5)
Eosinophils Relative: 1 %
HCT: 29.1 % — ABNORMAL LOW (ref 36.0–46.0)
Hemoglobin: 9.8 g/dL — ABNORMAL LOW (ref 12.0–15.0)
Immature Granulocytes: 8 %
Lymphocytes Relative: 7 %
Lymphs Abs: 0.9 10*3/uL (ref 0.7–4.0)
MCH: 32.5 pg (ref 26.0–34.0)
MCHC: 33.7 g/dL (ref 30.0–36.0)
MCV: 96.4 fL (ref 80.0–100.0)
Monocytes Absolute: 0.9 10*3/uL (ref 0.1–1.0)
Monocytes Relative: 7 %
Neutro Abs: 10.8 10*3/uL — ABNORMAL HIGH (ref 1.7–7.7)
Neutrophils Relative %: 76 %
Platelets: 212 10*3/uL (ref 150–400)
RBC: 3.02 MIL/uL — ABNORMAL LOW (ref 3.87–5.11)
RDW: 20.9 % — ABNORMAL HIGH (ref 11.5–15.5)
WBC: 14 10*3/uL — ABNORMAL HIGH (ref 4.0–10.5)
nRBC: 0 % (ref 0.0–0.2)

## 2021-03-10 MED ORDER — PALONOSETRON HCL INJECTION 0.25 MG/5ML
0.2500 mg | Freq: Once | INTRAVENOUS | Status: AC
  Administered 2021-03-10: 0.25 mg via INTRAVENOUS
  Filled 2021-03-10: qty 5

## 2021-03-10 MED ORDER — SODIUM CHLORIDE 0.9 % IV SOLN
Freq: Once | INTRAVENOUS | Status: AC
  Filled 2021-03-10: qty 250

## 2021-03-10 MED ORDER — HEPARIN SOD (PORK) LOCK FLUSH 100 UNIT/ML IV SOLN
500.0000 [IU] | Freq: Once | INTRAVENOUS | Status: AC
  Administered 2021-03-10: 500 [IU] via INTRAVENOUS
  Filled 2021-03-10: qty 5

## 2021-03-10 MED ORDER — SODIUM CHLORIDE 0.9 % IV SOLN
10.0000 mg | Freq: Once | INTRAVENOUS | Status: AC
  Administered 2021-03-10: 10 mg via INTRAVENOUS
  Filled 2021-03-10: qty 10

## 2021-03-10 MED ORDER — SODIUM CHLORIDE 0.9% FLUSH
10.0000 mL | Freq: Once | INTRAVENOUS | Status: AC
  Administered 2021-03-10: 10 mL via INTRAVENOUS
  Filled 2021-03-10: qty 10

## 2021-03-10 MED ORDER — SODIUM CHLORIDE 0.9 % IV SOLN: 1200.0000 mg | Freq: Once | INTRAVENOUS | Status: DC

## 2021-03-10 MED ORDER — SODIUM CHLORIDE 0.9 % IV SOLN
306.8000 mg | Freq: Once | INTRAVENOUS | Status: AC
  Administered 2021-03-10: 310 mg via INTRAVENOUS
  Filled 2021-03-10: qty 31

## 2021-03-10 MED ORDER — TRELEGY ELLIPTA 100-62.5-25 MCG/INH IN AEPB: 1.0000 | INHALATION_SPRAY | Freq: Every day | RESPIRATORY_TRACT | 2 refills | Status: DC

## 2021-03-10 MED ORDER — SODIUM CHLORIDE 0.9 % IV SOLN
150.0000 mg | Freq: Once | INTRAVENOUS | Status: AC
  Administered 2021-03-10: 150 mg via INTRAVENOUS
  Filled 2021-03-10: qty 150

## 2021-03-10 MED ORDER — ONDANSETRON HCL 8 MG PO TABS: ORAL_TABLET | ORAL | 3 refills | Status: DC

## 2021-03-10 MED ORDER — HEPARIN SOD (PORK) LOCK FLUSH 100 UNIT/ML IV SOLN
INTRAVENOUS | Status: AC
  Filled 2021-03-10: qty 5

## 2021-03-10 MED ORDER — SODIUM CHLORIDE 0.9 % IV SOLN
100.0000 mg/m2 | Freq: Once | INTRAVENOUS | Status: AC
  Administered 2021-03-10: 180 mg via INTRAVENOUS
  Filled 2021-03-10: qty 9

## 2021-03-10 NOTE — Patient Instructions (Signed)
[facility]  Discharge Instructions: Thank you for choosing [facility] to provide your oncology and hematology care.  If you have a lab appointment with the [facility], please go directly to the [facility] and check in at the registration area.  Wear comfortable clothing and clothing appropriate for easy access to any Portacath or PICC line.   We strive to give you quality time with your provider. You may need to reschedule your appointment if you arrive late (15 or more minutes).  Arriving late affects you and other patients whose appointments are after yours.  Also, if you miss three or more appointments without notifying the office, you may be dismissed from the clinic at the provider's discretion.      For prescription refill requests, have your pharmacy contact our office and allow 72 hours for refills to be completed.    Today you received the following chemotherapy and/or immunotherapy agents CARBOPLATIN, ETOPOSIDE      To help prevent nausea and vomiting after your treatment, we encourage you to take your nausea medication as directed.  BELOW ARE SYMPTOMS THAT SHOULD BE REPORTED IMMEDIATELY: *FEVER GREATER THAN 100.4 F (38 C) OR HIGHER *CHILLS OR SWEATING *NAUSEA AND VOMITING THAT IS NOT CONTROLLED WITH YOUR NAUSEA MEDICATION *UNUSUAL SHORTNESS OF BREATH *UNUSUAL BRUISING OR BLEEDING *URINARY PROBLEMS (pain or burning when urinating, or frequent urination) *BOWEL PROBLEMS (unusual diarrhea, constipation, pain near the anus) TENDERNESS IN MOUTH AND THROAT WITH OR WITHOUT PRESENCE OF ULCERS (sore throat, sores in mouth, or a toothache) UNUSUAL RASH, SWELLING OR PAIN  UNUSUAL VAGINAL DISCHARGE OR ITCHING   Items with * indicate a potential emergency and should be followed up as soon as possible or go to the Emergency Department if any problems should occur.  Please show the CHEMOTHERAPY ALERT CARD or IMMUNOTHERAPY ALERT CARD  at check-in to the Emergency Department and triage nurse.  Should you have questions after your visit or need to cancel or reschedule your appointment, please contact [facility]  [phone] and follow the prompts.  Office hours are 8:00 a.m. to 4:30 p.m. Monday - Friday. Please note that voicemails left after 4:00 p.m. may not be returned until the following business day.  We are closed weekends and major holidays. You have access to a nurse at all times for urgent questions. Please call the main number to the clinic [phone] and follow the prompts.  For any non-urgent questions, you may also contact your provider using MyChart. We now offer e-Visits for anyone 27 and older to request care online for non-urgent symptoms. For details visit [email or web address].   Also download the MyChart app! Go to the app store, search "MyChart", open the app, select [facility], and log in with your MyChart username and password.  Due to Covid, a mask is required upon entering the hospital/clinic. If you do not have a mask, one will be given to you upon arrival. For doctor visits, patients may have 1 support person aged 42 or older with them. For treatment visits, patients cannot have anyone with them due to current Covid guidelines and our immunocompromised population.   Carboplatin injection What is this medication? CARBOPLATIN (KAR boe pla tin) is a chemotherapy drug. It targets fast dividing cells, like cancer cells, and causes these cells to die. This medicine is usedto treat ovarian cancer and many other cancers. This medicine may be used for other purposes; ask your health care provider orpharmacist  if you have questions. COMMON BRAND NAME(S): Paraplatin What should I tell my care team before I take this medication? They need to know if you have any of these conditions: blood disorders hearing problems kidney disease recent or ongoing radiation therapy an  unusual or allergic reaction to carboplatin, cisplatin, other chemotherapy, other medicines, foods, dyes, or preservatives pregnant or trying to get pregnant breast-feeding How should I use this medication? This drug is usually given as an infusion into a vein. It is administered in [facility] or clinic by a specially trained health care professional. Talk to your pediatrician regarding the use of this medicine in children.Special care may be needed. Overdosage: If you think you have taken too much of this medicine contact apoison control center or emergency room at once. NOTE: This medicine is only for you. Do not share this medicine with others. What if I miss a dose? It is important not to miss a dose. Call your doctor or health careprofessional if you are unable to keep an appointment. What may interact with this medication? medicines for seizures medicines to increase blood counts like filgrastim, pegfilgrastim, sargramostim some antibiotics like amikacin, gentamicin, neomycin, streptomycin, tobramycin vaccines Talk to your doctor or health care professional before taking any of thesemedicines: acetaminophen aspirin ibuprofen ketoprofen naproxen This list may not describe all possible interactions. Give your health care provider a list of all the medicines, herbs, non-prescription drugs, or dietary supplements you use. Also tell them if you smoke, drink alcohol, or use illegaldrugs. Some items may interact with your medicine. What should I watch for while using this medication? Your condition will be monitored carefully while you are receiving this medicine. You will need important blood work done while you are taking thismedicine. This drug may make you feel generally unwell. This is not uncommon, as chemotherapy can affect healthy cells as well as cancer cells. Report any side effects. Continue your course of treatment even though you feel ill unless yourdoctor tells you to stop. In  some cases, you may be given additional medicines to help with side effects.Follow all directions for their use. Call your doctor or health care professional for advice if you get a fever, chills or sore throat, or other symptoms of a cold or flu. Do not treat yourself. This drug decreases your body's ability to fight infections. Try toavoid being around people who are sick. This medicine may increase your risk to bruise or bleed. Call your doctor orhealth care professional if you notice any unusual bleeding. Be careful brushing and flossing your teeth or using a toothpick because you may get an infection or bleed more easily. If you have any dental work done,tell your dentist you are receiving this medicine. Avoid taking products that contain aspirin, acetaminophen, ibuprofen, naproxen, or ketoprofen unless instructed by your doctor. These medicines may hide afever. Do not become pregnant while taking this medicine. Women should inform their doctor if they wish to become pregnant or think they might be pregnant. There is a potential for serious side effects to an unborn child. Talk to your health care professional or pharmacist for more information. Do not breast-feed aninfant while taking this medicine. What side effects may I notice from receiving this medication? Side effects that you should report to your doctor or health care professionalas soon as possible: allergic reactions like skin rash, itching or hives, swelling of the face, lips, or tongue signs of infection - fever or chills, cough, sore throat, pain or difficulty passing urine signs  of decreased platelets or bleeding - bruising, pinpoint red spots on the skin, black, tarry stools, nosebleeds signs of decreased red blood cells - unusually weak or tired, fainting spells, lightheadedness breathing problems changes in hearing changes in vision chest pain high blood pressure low blood counts - This drug may decrease the number of white  blood cells, red blood cells and platelets. You may be at increased risk for infections and bleeding. nausea and vomiting pain, swelling, redness or irritation at the injection site pain, tingling, numbness in the hands or feet problems with balance, talking, walking trouble passing urine or change in the amount of urine Side effects that usually do not require medical attention (report to yourdoctor or health care professional if they continue or are bothersome): hair loss loss of appetite metallic taste in the mouth or changes in taste This list may not describe all possible side effects. Call your doctor for medical advice about side effects. You may report side effects to FDA at1-800-FDA-1088. Where should I keep my medication? This drug is given in a hospital or clinic and will not be stored at home. NOTE: This sheet is a summary. It may not cover all possible information. If you have questions about this medicine, talk to your doctor, pharmacist, orhealth care provider.  2022 Elsevier/Gold Standard (2007-11-14 14:38:05)  Etoposide, VP-16 capsules What is this medication? ETOPOSIDE, VP-16 (e toe POE side) is a chemotherapy drug. It is used to treatsmall cell lung cancer and other cancers. This medicine may be used for other purposes; ask your health care provider orpharmacist if you have questions. COMMON BRAND NAME(S): VePesid What should I tell my care team before I take this medication? They need to know if you have any of these conditions: infection kidney disease liver disease low blood counts, like low white cell, platelet, or red cell counts an unusual or allergic reaction to etoposide, other medicines, foods, dyes, or preservatives pregnant or trying to get pregnant breast-feeding How should I use this medication? Take this medicine by mouth with a glass of water. Follow the directions on the prescription label. Do not open, crush, or chew the capsules. It is advisable to  wear gloves when handling this medicine. Take your medicine at regular intervals. Do not take it more often than directed. Do not stop taking excepton your doctor's advice. Talk to your pediatrician regarding the use of this medicine in children.Special care may be needed. Overdosage: If you think you have taken too much of this medicine contact apoison control center or emergency room at once. NOTE: This medicine is only for you. Do not share this medicine with others. What if I miss a dose? If you miss a dose, take it as soon as you can. If it is almost time for yournext dose, take only that dose. Do not take double or extra doses. What may interact with this medication? This medicine may interact with the following medications: cyclosporine warfarin This list may not describe all possible interactions. Give your health care provider a list of all the medicines, herbs, non-prescription drugs, or dietary supplements you use. Also tell them if you smoke, drink alcohol, or use illegaldrugs. Some items may interact with your medicine. What should I watch for while using this medication? Visit your doctor for checks on your progress. This drug may make you feel generally unwell. This is not uncommon, as chemotherapy can affect healthy cells as well as cancer cells. Report any side effects. Continue your course oftreatment even  though you feel ill unless your doctor tells you to stop. In some cases, you may be given additional medicines to help with side effects.Follow all directions for their use. Call your doctor or health care professional for advice if you get a fever, chills or sore throat, or other symptoms of a cold or flu. Do not treat yourself. This drug decreases your body's ability to fight infections. Try toavoid being around people who are sick. This medicine may increase your risk to bruise or bleed. Call your doctor orhealth care professional if you notice any unusual bleeding. Talk to  your doctor about your risk of cancer. You may be more at risk forcertain types of cancers if you take this medicine. Do not become pregnant while taking this medicine or for at least 6 months after stopping it. Women should inform their doctor if they wish to become pregnant or think they might be pregnant. Women of child-bearing potential will need to have a negative pregnancy test before starting this medicine. There is a potential for serious side effects to an unborn child. Talk to your health care professional or pharmacist for more information. Do not breast-feed aninfant while taking this medicine. Men must use a latex condom during sexual contact with a woman while taking this medicine and for at least 4 months after stopping it. A latex condom is needed even if you have had a vasectomy. Contact your doctor right away if your partner becomes pregnant. Do not donate sperm while taking this medicine and for 4 months after you stop taking this medicine. Men should inform theirdoctors if they wish to father a child. This medicine may lower sperm counts. What side effects may I notice from receiving this medication? Side effects that you should report to your doctor or health care professionalas soon as possible: allergic reactions like skin rash, itching or hives, swelling of the face, lips, or tongue low blood counts - this medicine may decrease the number of white blood cells, red blood cells, and platelets. You may be at increased risk for infections and bleeding nausea, vomiting redness, blistering, peeling or loosening of the skin, including inside the mouth signs and symptoms of infection like fever; chills; cough; sore throat; pain or trouble passing urine signs and symptoms of low red blood cells or anemia such as unusually weak or tired; feeling faint or lightheaded; falls; breathing problems unusual bruising or bleeding Side effects that usually do not require medical attention (report to  yourdoctor or health care professional if they continue or are bothersome): changes in taste diarrhea hair loss loss of appetite mouth sores This list may not describe all possible side effects. Call your doctor for medical advice about side effects. You may report side effects to FDA at1-800-FDA-1088. Where should I keep my medication? Keep out of the reach of children. Store in a refrigerator between 2 and 8 degrees C (36 and 46 degrees F). Do notfreeze. Throw away any unused medicine after the expiration date. NOTE: This sheet is a summary. It may not cover all possible information. If you have questions about this medicine, talk to your doctor, pharmacist, orhealth care provider.  2022 Elsevier/Gold Standard (2018-10-04 16:44:55)

## 2021-03-10 NOTE — Progress Notes (Signed)
[place]  [phone] [phone]   Name: Beth Mcmillan Date: 03/10/2021 MRN: [id number]  DOB: 27-Nov-1945  Patient Care Team: Idelle Crouch, MD as PCP - General (Internal Medicine) Telford Nab, RN as Oncology Nurse Navigator Earlie Server, MD as Consulting Physician (Hematology and Oncology)    REASON FOR CONSULTATION: Beth Mcmillan is a 75 y.o. 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  Patient was recently hospitalized 02/02/2021-02/05/2021 for suspected sepsis, although a clear source of infection was never identified.  She was again hospitalized 02/07/2021-02/08/2021 with altered mental status thought secondary to delirium from lack of sleep.  This resolved quickly with use of as needed Ativan.  Patient was also treated for right lower extremity cellulitis.  Patient was referred to palliative care to help address goals and manage ongoing symptoms.   SOCIAL HISTORY:     reports that she quit smoking about 4 months ago. Her smoking use included cigarettes. She has a 25.00 pack-year smoking history. She has never used smokeless tobacco. She reports that she does not drink alcohol and does not use drugs.  Patient lives at home with her husband.  She has a son and daughter who are involved in her care.  Patient previously worked as a Network engineer at [employer] and then later [employer].  ADVANCE DIRECTIVES:  Not on file  CODE STATUS:   PAST MEDICAL HISTORY: Past Medical History:  Diagnosis Date   Anxiety    Breathing problem    low breathing function 53%   COPD (chronic obstructive pulmonary disease) (HCC)    EMPHYSEMA   Dysrhythmia    tachycardia   Emphysema of lung (HCC)    Hemorrhoid    History of hiatal hernia    SMALL- 2022    Hypercholesteremia    Hypertension    Hypothyroidism    Larynx polyp    Lung cancer (HCC)    Palpitations    with anxiety   Platelets decreased ([place]) 2020   Seasonal allergies    Tinnitus    Vertigo    no episodes in several years   Wears dentures    partial lower   White coat syndrome with hypertension     PAST SURGICAL HISTORY:  Past Surgical History:  Procedure Laterality Date   ABDOMINAL HYSTERECTOMY     partial   COLONOSCOPY WITH PROPOFOL N/A 01/22/2016   Procedure: COLONOSCOPY WITH PROPOFOL;  Surgeon: Lucilla Lame, MD;  Location: [place];  Service: Endoscopy;  Laterality: N/A;   COLONOSCOPY WITH PROPOFOL N/A 04/12/2019   Procedure: COLONOSCOPY WITH BIOPSY;  Surgeon: Lucilla Lame, MD;  Location: [place];  Service: Endoscopy;  Laterality: N/A;  pt would like an early appt   ct guided lung bx     IR IMAGING GUIDED PORT INSERTION  12/18/2020   POLYPECTOMY  01/22/2016   Procedure: POLYPECTOMY;  Surgeon: Lucilla Lame, MD;  Location: [place];  Service: Endoscopy;;   POLYPECTOMY N/A 04/12/2019   Procedure: POLYPECTOMY;  Surgeon: Lucilla Lame, MD;  Location: [place];  Service: Endoscopy;  Laterality: N/A;   VIDEO BRONCHOSCOPY WITH ENDOBRONCHIAL NAVIGATION N/A 11/21/2020   Procedure: VIDEO BRONCHOSCOPY WITH ENDOBRONCHIAL NAVIGATION;  Surgeon: Ottie Glazier, MD;  Location: [facility];  Service: Thoracic;  Laterality: N/A;   VIDEO BRONCHOSCOPY WITH ENDOBRONCHIAL ULTRASOUND N/A 11/21/2020  Procedure: VIDEO BRONCHOSCOPY WITH ENDOBRONCHIAL ULTRASOUND;  Surgeon: Ottie Glazier, MD;  Location: [facility];  Service: Thoracic;  Laterality: N/A;    HEMATOLOGY/ONCOLOGY HISTORY:  Oncology History  Small cell lung cancer in adult ([place])  12/13/2020 Initial Diagnosis   Small cell lung cancer in adult [facility])    12/22/2020 -  Chemotherapy    Patient is on Treatment Plan: LUNG SCLC CARBOPLATIN + ETOPOSIDE + ATEZOLIZUMAB INDUCTION Q21D / ATEZOLIZUMAB MAINTENANCE  Q21D       12/22/2020 Cancer Staging   Staging form: Lung, AJCC 8th Edition - Clinical stage from 12/22/2020: Stage IV (cT2, cN0, cM1) - Signed by Sindy Guadeloupe, MD on 12/22/2020      ALLERGIES:  is allergic to augmentin [amoxicillin-pot clavulanate], codeine, levaquin [levofloxacin], sulfa antibiotics, vancomycin, and tape.  MEDICATIONS:  Current Outpatient Medications  Medication Sig Dispense Refill   acetaminophen (TYLENOL) 500 MG tablet Take 500 mg by mouth every 6 (six) hours as needed for mild pain or moderate pain.     atorvastatin (LIPITOR) 10 MG tablet TAKE 1 TABLET BY MOUTH DAILY 90 tablet 0   Fluticasone-Umeclidin-Vilant (TRELEGY ELLIPTA) 100-62.5-25 MCG/INH AEPB Inhale 1 puff into the lungs daily. 1 each 2   ketotifen (ZADITOR) 0.025 % ophthalmic solution Place 1 drop into both eyes 2 (two) times daily as needed.     levothyroxine (SYNTHROID, LEVOTHROID) 50 MCG tablet Take 50 mcg by mouth daily before breakfast.     loperamide (IMODIUM) 2 MG capsule Take 2 mg by mouth as needed for diarrhea or loose stools.     loratadine (CLARITIN) 10 MG tablet Take 10 mg by mouth daily. Reported on 01/22/2016     LORazepam (ATIVAN) 0.5 MG tablet Take 1 tablet (0.5 mg total) by mouth every 6 (six) hours as needed for anxiety. (Patient not taking: Reported on 03/10/2021) 120 tablet 0   losartan-hydrochlorothiazide (HYZAAR) 100-25 MG tablet Take 1 tablet by mouth daily.     magic mouthwash w/lidocaine SOLN Take 5 mLs by mouth 4 (four) times daily as needed for mouth pain. (Patient not taking: Reported on 03/10/2021) 240 mL 0   magnesium chloride (SLOW-MAG) 64 MG TBEC SR tablet Take 1 tablet (64 mg total) by mouth daily. 30 tablet 0   metoCLOPramide (REGLAN) 10 MG tablet Take 1 tablet (10 mg total) by mouth 4 (four) times daily -  before meals and at bedtime. 120 tablet 0   metoprolol succinate (TOPROL-XL) 50 MG 24 hr tablet Take 50 mg by mouth daily.     nitrofurantoin, macrocrystal-monohydrate,  (MACROBID) 100 MG capsule Take 1 capsule (100 mg total) by mouth 2 (two) times daily. 10 capsule 0   nystatin (MYCOSTATIN) 100000 UNIT/ML suspension SHAKE LIQUID AND TAKE 5 ML BY MOUTH FOUR TIMES DAILY 60 mL 0   ondansetron (ZOFRAN) 8 MG tablet TAKE 1 TABLET BY MOUTH TWICE DAILY AS NEEDED FOR REFRACTORY NAUSEA/ VOMITING. START ON DAY 3 AFTER CARBOPLATIN CHEMO 30 tablet 3   PARoxetine (PAXIL) 10 MG tablet Take 5 mg by mouth every evening.     polyethylene glycol (MIRALAX / GLYCOLAX) 17 g packet Take 17 g by mouth daily as needed. (Patient not taking: Reported on 03/10/2021)     potassium chloride SA (KLOR-CON) 20 MEQ tablet Take 1 tablet (20 mEq total) by mouth daily. 28 tablet 0   prochlorperazine (COMPAZINE) 10 MG tablet Take 10 mg by mouth as needed for nausea or vomiting.     QUEtiapine (SEROQUEL) 25 MG tablet Take 1 tablet (25  mg total) by mouth at bedtime. 30 tablet 2   sodium chloride 1 g tablet Take 1 tablet (1 g total) by mouth 3 (three) times daily. 42 tablet 0   No current facility-administered medications for this visit.   Facility-Administered Medications Ordered in Other Visits  Medication Dose Route Frequency Provider Last Rate Last Admin   CARBOplatin (PARAPLATIN) 310 mg in sodium chloride 0.9 % 250 mL chemo infusion  310 mg Intravenous Once Sindy Guadeloupe, MD 562 mL/hr at 03/10/21 1018 310 mg at 03/10/21 1018   etoposide (VEPESID) 180 mg in sodium chloride 0.9 % 500 mL chemo infusion  100 mg/m2 (Treatment Plan Recorded) Intravenous Once Sindy Guadeloupe, MD       heparin lock flush 100 unit/mL  500 Units Intravenous Once Sindy Guadeloupe, MD        VITAL SIGNS: There were no vitals taken for this visit. There were no vitals filed for this visit.  Estimated body mass index is 22.71 kg/m as calculated from the following:   Height as of 02/07/21: 5\' 7"  (1.702 m).   Weight as of 02/24/21: 145 lb (65.8 kg).  LABS: CBC:    Component Value Date/Time   WBC 14.0 (H) 03/10/2021 0812    HGB 9.8 (L) 03/10/2021 0812   HGB 13.8 06/05/2013 1127   HCT 29.1 (L) 03/10/2021 0812   HCT 40.0 06/05/2013 1127   PLT 212 03/10/2021 0812   PLT 303 06/05/2013 1127   MCV 96.4 03/10/2021 0812   MCV 92 06/05/2013 1127   NEUTROABS 10.8 (H) 03/10/2021 0812   LYMPHSABS 0.9 03/10/2021 0812   MONOABS 0.9 03/10/2021 0812   EOSABS 0.1 03/10/2021 0812   BASOSABS 0.1 03/10/2021 0812   Comprehensive Metabolic Panel:    Component Value Date/Time   NA 128 (L) 03/10/2021 0812   NA 131 (L) 06/05/2013 1127   K 4.3 03/10/2021 0812   K 3.0 (L) 06/05/2013 1127   CL 100 03/10/2021 0812   CL 96 (L) 06/05/2013 1127   CO2 20 (L) 03/10/2021 0812   CO2 26 06/05/2013 1127   BUN 14 03/10/2021 0812   BUN 10 06/05/2013 1127   CREATININE 0.89 03/10/2021 0812   CREATININE 0.94 06/05/2013 1127   GLUCOSE 146 (H) 03/10/2021 0812   GLUCOSE 127 (H) 06/05/2013 1127   CALCIUM 8.5 (L) 03/10/2021 0812   CALCIUM 9.1 06/05/2013 1127   AST 18 03/10/2021 0812   ALT 14 03/10/2021 0812   ALKPHOS 63 03/10/2021 0812   BILITOT 0.4 03/10/2021 0812   PROT 6.3 (L) 03/10/2021 0812   ALBUMIN 3.5 03/10/2021 0812    RADIOGRAPHIC STUDIES: No results found.  PERFORMANCE STATUS (ECOG) : 1 - Symptomatic but completely ambulatory  Review of Systems Unless otherwise noted, a complete review of systems is negative.  Physical Exam General: NAD Pulmonary: Unlabored Extremities: no edema, no joint deformities Skin: no rashes Neurological: Weakness but otherwise nonfocal  IMPRESSION: Patient known to me from [facility] but today I introduced the concept of palliative care.  She reports that she is doing reasonably well.  She denies any significant changes or concerns today.  She continues to have scabbed area on her leg but overall it is much improved.  She continues to endorse poor oral intake but says that she is now drinking nutritional supplements 4-5 times a day.  I reviewed with patient ACP documents and a MOST form, which  she took home to discuss with family.  PLAN: -Continue current prescription treatment -ACP/MOST  form reviewed -Follow-up MyChart visit 1 month   Patient expressed understanding and was in agreement with this plan. She also understands that She can call the clinic at any time with any questions, concerns, or complaints.     Time Total: 20 minutes  Visit consisted of counseling and education dealing with the complex and emotionally intense issues of symptom management and palliative care in the setting of serious and potentially life-threatening illness.Greater than 50%  of this time was spent counseling and coordinating care related to the above assessment and plan.  Signed by: Altha Harm, PhD, NP-C

## 2021-03-10 NOTE — Progress Notes (Signed)
Nutrition Follow-up:    Patient with small cell lung cancer with mets to bone.  Patient completed radiation on 5/31.  Patient receiving carboplatin, etoposide, tecentriq.  Met with patient during infusion.  Patient reports poor appetite.  Has been drinking 4-5 boost soothe shakes (300 calories, 10 g protein) and drinking 1-2 gatorades.  Has been eating very little solid foods due to nausea.  Can smell food and feels sick. Says that she has been taking nausea medication. Planning on giving new nausea medication today.    Noted recent treatment for UTI  Medications: reviewed  Labs: Na 128, glucose 146  Anthropometrics:   No new weight today  7/5 145 lb 140 lb on 6/28 139 lb on 5/31 145 lb on 5/12 144 lb on 3/30  NUTRITION DIAGNOSIS: Inadequate oral intake continues   INTERVENTION:  Continue to encourage drinking boost soothe shakes for calories and protein and solid food intake so poor  Discussed bland foods to nibble on during the day with nausea (pretzels, saltines, jello, popsciles, italian ice, sherbet.  Provided recipes for nausea for patient to try.     MONITORING, EVALUATION, GOAL: weight trends, intake   NEXT VISIT: Tuesday, August 9th during infusion  Yeslin Delio B. Zenia Resides, [place], [place] Registered Dietitian [phone] (mobile)

## 2021-03-10 NOTE — Progress Notes (Signed)
Pt weak and has diarreha stool every morning and it has some form. There are some days that pt has more than 1 stool. She can't eat- the smell and taste is nauseated. She drinks 4-5 ensure every day and drinks gatorade and water all day along. She feels weak.

## 2021-03-10 NOTE — Progress Notes (Signed)
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  She underwent CT chest with contrast in March 2022 following an abnormal x-ray which showed a 2.6 cm mass in the left lower lobe.  This was followed by a PET CT scan which showed a 3.0 x 2.0 cm mass in the left lower lobe of the lung with an SUV of 9.7.  No evidence of locoregional adenopathy.  No evidence of intra-abdominal disease with patient was noted to have a hypermetabolic focus in the right iliac bone with an SUV of 11.1 which was suspicious for metastatic disease.  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  Cells were positive for TTF-1, CD56 and chromogranin.  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  Patient completed whole brain radiation treatment and resumed carbo etoposide Tecentriq chemotherapy      Interval history-patient has ongoing fatigue but is overall at her baseline state of health.   Appetite is fair.  She does have some ongoing nausea which is not well controlled with Zofran and Compazine as needed.  No recent episodes of confusion.  ECOG PS- 1 Pain scale- 0 Opioid associated constipation- no  Review of systems- Review of Systems  Constitutional:  Positive for malaise/fatigue. Negative for chills, fever and weight loss.  HENT:  Negative for congestion, ear discharge and nosebleeds.   Eyes:  Negative for blurred vision.  Respiratory:  Negative for cough, hemoptysis, sputum production, shortness of breath and wheezing.   Cardiovascular:  Negative for chest pain, palpitations, orthopnea and claudication.  Gastrointestinal:  Positive for nausea. Negative for abdominal pain, blood in stool, constipation, diarrhea, heartburn, melena and vomiting.  Genitourinary:  Negative for dysuria, flank pain, frequency, hematuria and urgency.  Musculoskeletal:  Negative for back pain, joint pain and myalgias.  Skin:  Negative for rash.  Neurological:  Negative for dizziness, tingling, focal weakness, seizures, weakness and headaches.  Endo/Heme/Allergies:  Does not bruise/bleed easily.  Psychiatric/Behavioral:  Negative for depression and suicidal ideas. The patient does not have insomnia.       Allergies  Allergen Reactions   Augmentin [Amoxicillin-Pot Clavulanate]     "messes up my blood cells"   Codeine Nausea Only   Levaquin [Levofloxacin]     Delusions    Sulfa Antibiotics Nausea And Vomiting   Vancomycin Other (See Comments)    Redman syndome- [facility]   Tape Rash    Some bandaids cause rash sometimes     Past Medical History:  Diagnosis Date   Anxiety    Breathing  problem    low breathing function 53%   COPD (chronic obstructive pulmonary disease) (HCC)    EMPHYSEMA   Dysrhythmia    tachycardia   Emphysema of lung (HCC)    Hemorrhoid    History of hiatal hernia    SMALL- 2022   Hypercholesteremia    Hypertension    Hypothyroidism     Larynx polyp    Lung cancer (HCC)    Palpitations    with anxiety   Platelets decreased ([place]) 2020   Seasonal allergies    Tinnitus    Vertigo    no episodes in several years   Wears dentures    partial lower   White coat syndrome with hypertension      Past Surgical History:  Procedure Laterality Date   ABDOMINAL HYSTERECTOMY     partial   COLONOSCOPY WITH PROPOFOL N/A 01/22/2016   Procedure: COLONOSCOPY WITH PROPOFOL;  Surgeon: Lucilla Lame, MD;  Location: [place];  Service: Endoscopy;  Laterality: N/A;   COLONOSCOPY WITH PROPOFOL N/A 04/12/2019   Procedure: COLONOSCOPY WITH BIOPSY;  Surgeon: Lucilla Lame, MD;  Location: [place];  Service: Endoscopy;  Laterality: N/A;  pt would like an early appt   ct guided lung bx     IR IMAGING GUIDED PORT INSERTION  12/18/2020   POLYPECTOMY  01/22/2016   Procedure: POLYPECTOMY;  Surgeon: Lucilla Lame, MD;  Location: [place];  Service: Endoscopy;;   POLYPECTOMY N/A 04/12/2019   Procedure: POLYPECTOMY;  Surgeon: Lucilla Lame, MD;  Location: [place];  Service: Endoscopy;  Laterality: N/A;   VIDEO BRONCHOSCOPY WITH ENDOBRONCHIAL NAVIGATION N/A 11/21/2020   Procedure: VIDEO BRONCHOSCOPY WITH ENDOBRONCHIAL NAVIGATION;  Surgeon: Ottie Glazier, MD;  Location: [facility];  Service: Thoracic;  Laterality: N/A;   VIDEO BRONCHOSCOPY WITH ENDOBRONCHIAL ULTRASOUND N/A 11/21/2020   Procedure: VIDEO BRONCHOSCOPY WITH ENDOBRONCHIAL ULTRASOUND;  Surgeon: Ottie Glazier, MD;  Location: [facility];  Service: Thoracic;  Laterality: N/A;    Social History   Socioeconomic History   Marital status: Married    Spouse name: Not on file   Number of children: Not on file   Years of education: Not on file   Highest education level: Not on file  Occupational History   Not on file  Tobacco Use   Smoking status: Former    Packs/day: 0.50    Years: 50.00    Pack years: 25.00    Types: Cigarettes    Quit date: 11/03/2020     Years since quitting: 0.3   Smokeless tobacco: Never  Vaping Use   Vaping Use: Some days  Substance and Sexual Activity   Alcohol use: No   Drug use: Never   Sexual activity: Not on file  Other Topics Concern   Not on file  Social History Narrative   Not on file   Social Determinants of Health   Financial Resource Strain: Not on file  Food Insecurity: Not on file  Transportation Needs: Not on file  Physical Activity: Not on file  Stress: Not on file  Social Connections: Not on file  Intimate Partner Violence: Not on file    Family History  Problem Relation Age of Onset   Alzheimer's disease Mother    Heart attack Mother    High Cholesterol Mother    Hypertension Mother    Heart block Mother    Lung cancer Father    Uterine cancer Sister    Hypertension Sister    Heart block Sister  Anxiety disorder Sister    Breast cancer Neg Hx      Current Outpatient Medications:    acetaminophen (TYLENOL) 500 MG tablet, Take 500 mg by mouth every 6 (six) hours as needed for mild pain or moderate pain., Disp: , Rfl:    atorvastatin (LIPITOR) 10 MG tablet, TAKE 1 TABLET BY MOUTH DAILY, Disp: 90 tablet, Rfl: 0   doxycycline (VIBRAMYCIN) 100 MG capsule, Take 100 mg by mouth 2 (two) times daily., Disp: , Rfl:    Fluticasone-Umeclidin-Vilant (TRELEGY ELLIPTA) 100-62.5-25 MCG/INH AEPB, Inhale into the lungs., Disp: , Rfl:    ketotifen (ZADITOR) 0.025 % ophthalmic solution, Place 1 drop into both eyes 2 (two) times daily as needed., Disp: , Rfl:    levothyroxine (SYNTHROID, LEVOTHROID) 50 MCG tablet, Take 50 mcg by mouth daily before breakfast., Disp: , Rfl:    loratadine (CLARITIN) 10 MG tablet, Take 10 mg by mouth daily. Reported on 01/22/2016, Disp: , Rfl:    LORazepam (ATIVAN) 0.5 MG tablet, Take 1 tablet (0.5 mg total) by mouth every 6 (six) hours as needed for anxiety., Disp: 120 tablet, Rfl: 0   losartan-hydrochlorothiazide (HYZAAR) 100-25 MG tablet, Take 1 tablet by mouth daily.,  Disp: , Rfl:    magic mouthwash w/lidocaine SOLN, Take 5 mLs by mouth 4 (four) times daily as needed for mouth pain., Disp: 240 mL, Rfl: 0   magnesium chloride (SLOW-MAG) 64 MG TBEC SR tablet, Take 1 tablet (64 mg total) by mouth daily., Disp: 30 tablet, Rfl: 0   metoCLOPramide (REGLAN) 10 MG tablet, Take 1 tablet (10 mg total) by mouth 4 (four) times daily -  before meals and at bedtime., Disp: 120 tablet, Rfl: 0   metoprolol succinate (TOPROL-XL) 50 MG 24 hr tablet, Take 50 mg by mouth daily., Disp: , Rfl:    nitrofurantoin, macrocrystal-monohydrate, (MACROBID) 100 MG capsule, Take 1 capsule (100 mg total) by mouth 2 (two) times daily., Disp: 10 capsule, Rfl: 0   nystatin (MYCOSTATIN) 100000 UNIT/ML suspension, SHAKE LIQUID AND TAKE 5 ML BY MOUTH FOUR TIMES DAILY, Disp: 60 mL, Rfl: 0   ondansetron (ZOFRAN) 8 MG tablet, TAKE 1 TABLET BY MOUTH TWICE DAILY AS NEEDED FOR REFRACTORY NAUSEA/ VOMITING. START ON DAY 3 AFTER CARBOPLATIN CHEMO, Disp: 30 tablet, Rfl: 3   PARoxetine (PAXIL) 10 MG tablet, Take 5 mg by mouth every evening., Disp: , Rfl:    polyethylene glycol (MIRALAX / GLYCOLAX) 17 g packet, Take 17 g by mouth daily as needed., Disp: , Rfl:    potassium chloride SA (KLOR-CON) 20 MEQ tablet, Take 1 tablet (20 mEq total) by mouth daily., Disp: 28 tablet, Rfl: 0   prochlorperazine (COMPAZINE) 10 MG tablet, Take 10 mg by mouth as needed for nausea or vomiting., Disp: , Rfl:    QUEtiapine (SEROQUEL) 25 MG tablet, Take 1 tablet (25 mg total) by mouth at bedtime., Disp: 30 tablet, Rfl: 2   sodium chloride 1 g tablet, Take 1 tablet (1 g total) by mouth 3 (three) times daily., Disp: 42 tablet, Rfl: 0 No current facility-administered medications for this visit.  Facility-Administered Medications Ordered in Other Visits:    heparin lock flush 100 unit/mL, 500 Units, Intravenous, Once, Sindy Guadeloupe, MD  Physical exam:  Vitals:   03/10/21 1216  BP: (!) 102/50  Pulse: 72  Resp: 16  Temp: 98.3 F  (36.8 C)  TempSrc: Oral  SpO2: 99%  Weight: 143 lb 1.6 oz (64.9 kg)  Height: 5\' 7"  (1.702 m)  Physical Exam Constitutional:      General: She is not in acute distress. Cardiovascular:     Rate and Rhythm: Normal rate and regular rhythm.     Heart sounds: Normal heart sounds.  Pulmonary:     Effort: Pulmonary effort is normal.     Breath sounds: Normal breath sounds.  Abdominal:     General: Bowel sounds are normal.     Palpations: Abdomen is soft.  Skin:    General: Skin is warm and dry.  Neurological:     Mental Status: She is alert and oriented to person, place, and time.     CMP Latest Ref Rng & Units 03/03/2021  Glucose 70 - 99 mg/dL 144(H)  BUN 8 - 23 mg/dL 8  Creatinine 0.44 - 1.00 mg/dL 0.77  Sodium 135 - 145 mmol/L 130(L)  Potassium 3.5 - 5.1 mmol/L 3.7  Chloride 98 - 111 mmol/L 101  CO2 22 - 32 mmol/L 20(L)  Calcium 8.9 - 10.3 mg/dL 8.3(L)  Total Protein 6.5 - 8.1 g/dL 5.9(L)  Total Bilirubin 0.3 - 1.2 mg/dL 0.2(L)  Alkaline Phos 38 - 126 U/L 92  AST 15 - 41 U/L 25  ALT 0 - 44 U/L 20   CBC Latest Ref Rng & Units 03/10/2021  WBC 4.0 - 10.5 K/uL 14.0(H)  Hemoglobin 12.0 - 15.0 g/dL 9.8(L)  Hematocrit 36.0 - 46.0 % 29.1(L)  Platelets 150 - 400 K/uL 212     Assessment and plan- Patient is a 75 y.o. female with extensive stage small cell lung cancer and brain metastases.  She is s/p whole brain radiation treatment and here for on treatment assessment prior to cycle 4 of carbo etoposide Tecentriq chemotherapy  Counts okay to proceed with cycle 4 of carbo etoposide  chemotherapy today.  She will be receiving Tecentriq tomorrow.  She will receive etoposide tomorrow and day after with on pro Neulasta support on day 3.  She is currently receiving carboplatin at a reduced dose of AUC 4 due to cytopenias and hospitalizations.  1 L of IV fluids tomorrow as well.  She will be seen by covering NP in 10 days with port labs CBC with differential, CMP for possible IV  fluids.  Bone metastases: She received 1 dose of Zometa on 02/18/2021 and I will plan to give her the next dose with cycle 1 of maintenance Tecentriq  Plan to repeat CT chest abdomen pelvis with contrast and bone scan 10 days from now.  Chemo induced nausea vomiting: I have asked her to take either Zofran or Compazine but we have added as needed Reglan and see if her symptoms are better controlled with that.  Chemo induced anemia: Stable continue to monitor.  Will check ferritin and iron studies with next set of labs  Leukocytosis: No signs and symptoms of infection.  Continue to monitor   Visit Diagnosis 1. Encounter for antineoplastic chemotherapy   2. Encounter for antineoplastic immunotherapy   3. Antineoplastic chemotherapy induced anemia   4. Chemotherapy induced nausea and vomiting      Dr. Randa Evens, MD, MPH [facility] at [facility] [phone] 03/10/2021 12:10 PM

## 2021-03-11 ENCOUNTER — Inpatient Hospital Stay: Payer: Medicare Other

## 2021-03-11 VITALS — BP 153/60 | HR 93 | Temp 97.0°F | Resp 16 | Wt 143.0 lb

## 2021-03-11 DIAGNOSIS — C3490 Malignant neoplasm of unspecified part of unspecified bronchus or lung: Secondary | ICD-10-CM

## 2021-03-11 DIAGNOSIS — Z5112 Encounter for antineoplastic immunotherapy: Secondary | ICD-10-CM | POA: Diagnosis not present

## 2021-03-11 DIAGNOSIS — N23 Unspecified renal colic: Secondary | ICD-10-CM

## 2021-03-11 LAB — URINALYSIS, COMPLETE (UACMP) WITH MICROSCOPIC
Bacteria, UA: NONE SEEN
Bilirubin Urine: NEGATIVE
Glucose, UA: 50 mg/dL — AB
Hgb urine dipstick: NEGATIVE
Ketones, ur: NEGATIVE mg/dL
Leukocytes,Ua: NEGATIVE
Nitrite: NEGATIVE
Protein, ur: NEGATIVE mg/dL
Specific Gravity, Urine: 1.015 (ref 1.005–1.030)
pH: 6 (ref 5.0–8.0)

## 2021-03-11 MED ORDER — DEXAMETHASONE SODIUM PHOSPHATE 100 MG/10ML IJ SOLN
10.0000 mg | Freq: Once | INTRAMUSCULAR | Status: AC
  Administered 2021-03-11: 10 mg via INTRAVENOUS
  Filled 2021-03-11: qty 10

## 2021-03-11 MED ORDER — HEPARIN SOD (PORK) LOCK FLUSH 100 UNIT/ML IV SOLN
500.0000 [IU] | Freq: Once | INTRAVENOUS | Status: AC | PRN
  Administered 2021-03-11: 500 [IU]
  Filled 2021-03-11: qty 5

## 2021-03-11 MED ORDER — SODIUM CHLORIDE 0.9 % IV SOLN
Freq: Once | INTRAVENOUS | Status: AC
  Filled 2021-03-11: qty 250

## 2021-03-11 MED ORDER — SODIUM CHLORIDE 0.9 % IV SOLN
1200.0000 mg | Freq: Once | INTRAVENOUS | Status: AC
  Administered 2021-03-11: 1200 mg via INTRAVENOUS
  Filled 2021-03-11: qty 20

## 2021-03-11 MED ORDER — HEPARIN SOD (PORK) LOCK FLUSH 100 UNIT/ML IV SOLN
INTRAVENOUS | Status: AC
  Filled 2021-03-11: qty 5

## 2021-03-11 MED ORDER — SODIUM CHLORIDE 0.9 % IV SOLN
100.0000 mg/m2 | Freq: Once | INTRAVENOUS | Status: AC
  Administered 2021-03-11: 180 mg via INTRAVENOUS
  Filled 2021-03-11: qty 9

## 2021-03-11 NOTE — Patient Instructions (Signed)
[facility]  Discharge Instructions: Thank you for choosing [facility] to provide your oncology and hematology care.  If you have a lab appointment with the [facility], please go directly to the [facility] and check in at the registration area.  Wear comfortable clothing and clothing appropriate for easy access to any Portacath or PICC line.   We strive to give you quality time with your provider. You may need to reschedule your appointment if you arrive late (15 or more minutes).  Arriving late affects you and other patients whose appointments are after yours.  Also, if you miss three or more appointments without notifying the office, you may be dismissed from the clinic at the provider's discretion.      For prescription refill requests, have your pharmacy contact our office and allow 72 hours for refills to be completed.    Today you received the following chemotherapy and/or immunotherapy agents : Tecentriq / Etoposide   To help prevent nausea and vomiting after your treatment, we encourage you to take your nausea medication as directed.  BELOW ARE SYMPTOMS THAT SHOULD BE REPORTED IMMEDIATELY: *FEVER GREATER THAN 100.4 F (38 C) OR HIGHER *CHILLS OR SWEATING *NAUSEA AND VOMITING THAT IS NOT CONTROLLED WITH YOUR NAUSEA MEDICATION *UNUSUAL SHORTNESS OF BREATH *UNUSUAL BRUISING OR BLEEDING *URINARY PROBLEMS (pain or burning when urinating, or frequent urination) *BOWEL PROBLEMS (unusual diarrhea, constipation, pain near the anus) TENDERNESS IN MOUTH AND THROAT WITH OR WITHOUT PRESENCE OF ULCERS (sore throat, sores in mouth, or a toothache) UNUSUAL RASH, SWELLING OR PAIN  UNUSUAL VAGINAL DISCHARGE OR ITCHING   Items with * indicate a potential emergency and should be followed up as soon as possible or go to the Emergency Department if any problems should occur.  Please show the CHEMOTHERAPY ALERT CARD or IMMUNOTHERAPY ALERT CARD at  check-in to the Emergency Department and triage nurse.  Should you have questions after your visit or need to cancel or reschedule your appointment, please contact [facility]  [phone] and follow the prompts.  Office hours are 8:00 a.m. to 4:30 p.m. Monday - Friday. Please note that voicemails left after 4:00 p.m. may not be returned until the following business day.  We are closed weekends and major holidays. You have access to a nurse at all times for urgent questions. Please call the main number to the clinic [phone] and follow the prompts.  For any non-urgent questions, you may also contact your provider using MyChart. We now offer e-Visits for anyone 45 and older to request care online for non-urgent symptoms. For details visit [email or web address].   Also download the MyChart app! Go to the app store, search "MyChart", open the app, select [facility], and log in with your MyChart username and password.  Due to Covid, a mask is required upon entering the hospital/clinic. If you do not have a mask, one will be given to you upon arrival. For doctor visits, patients may have 1 support person aged 90 or older with them. For treatment visits, patients cannot have anyone with them due to current Covid guidelines and our immunocompromised population.

## 2021-03-12 ENCOUNTER — Inpatient Hospital Stay: Payer: Medicare Other

## 2021-03-12 ENCOUNTER — Encounter: Payer: Self-pay | Admitting: *Deleted

## 2021-03-12 VITALS — BP 174/74 | HR 77 | Temp 98.0°F | Resp 18

## 2021-03-12 DIAGNOSIS — C3490 Malignant neoplasm of unspecified part of unspecified bronchus or lung: Secondary | ICD-10-CM

## 2021-03-12 DIAGNOSIS — Z5112 Encounter for antineoplastic immunotherapy: Secondary | ICD-10-CM | POA: Diagnosis not present

## 2021-03-12 MED ORDER — SODIUM CHLORIDE 0.9 % IV SOLN
Freq: Once | INTRAVENOUS | Status: AC
  Filled 2021-03-12: qty 250

## 2021-03-12 MED ORDER — SODIUM CHLORIDE 0.9 % IV SOLN
10.0000 mg | Freq: Once | INTRAVENOUS | Status: AC
  Administered 2021-03-12: 10 mg via INTRAVENOUS
  Filled 2021-03-12: qty 10

## 2021-03-12 MED ORDER — PEGFILGRASTIM 6 MG/0.6ML ~~LOC~~ PSKT
6.0000 mg | PREFILLED_SYRINGE | Freq: Once | SUBCUTANEOUS | Status: AC
  Administered 2021-03-12: 6 mg via SUBCUTANEOUS
  Filled 2021-03-12: qty 0.6

## 2021-03-12 MED ORDER — HEPARIN SOD (PORK) LOCK FLUSH 100 UNIT/ML IV SOLN
500.0000 [IU] | Freq: Once | INTRAVENOUS | Status: AC | PRN
  Administered 2021-03-12: 500 [IU]
  Filled 2021-03-12: qty 5

## 2021-03-12 MED ORDER — SODIUM CHLORIDE 0.9 % IV SOLN
100.0000 mg/m2 | Freq: Once | INTRAVENOUS | Status: AC
  Administered 2021-03-12: 180 mg via INTRAVENOUS
  Filled 2021-03-12: qty 9

## 2021-03-12 MED ORDER — SODIUM CHLORIDE 0.9% FLUSH
10.0000 mL | INTRAVENOUS | Status: DC | PRN
  Administered 2021-03-12: 10 mL
  Filled 2021-03-12: qty 10

## 2021-03-12 MED ORDER — HEPARIN SOD (PORK) LOCK FLUSH 100 UNIT/ML IV SOLN
INTRAVENOUS | Status: AC
  Filled 2021-03-12: qty 5

## 2021-03-12 NOTE — Progress Notes (Signed)
BP (!) 176/70 (BP Location: Right Arm)   Pulse 90   Temp 98 F (36.7 C) (Tympanic)   Resp 18   SpO2 100%   Dr. Janese Banks notified of BP. No additional IVFs today, chemo/neulasta OBI only.

## 2021-03-12 NOTE — Patient Instructions (Signed)
[facility]  Discharge Instructions: Thank you for choosing [facility] to provide your oncology and hematology care.  If you have a lab appointment with the [facility], please go directly to the [facility] and check in at the registration area.  Wear comfortable clothing and clothing appropriate for easy access to any Portacath or PICC line.   We strive to give you quality time with your provider. You may need to reschedule your appointment if you arrive late (15 or more minutes).  Arriving late affects you and other patients whose appointments are after yours.  Also, if you miss three or more appointments without notifying the office, you may be dismissed from the clinic at the provider's discretion.      For prescription refill requests, have your pharmacy contact our office and allow 72 hours for refills to be completed.    Today you received the following chemotherapy and/or immunotherapy agents - etoposide      To help prevent nausea and vomiting after your treatment, we encourage you to take your nausea medication as directed.  BELOW ARE SYMPTOMS THAT SHOULD BE REPORTED IMMEDIATELY: *FEVER GREATER THAN 100.4 F (38 C) OR HIGHER *CHILLS OR SWEATING *NAUSEA AND VOMITING THAT IS NOT CONTROLLED WITH YOUR NAUSEA MEDICATION *UNUSUAL SHORTNESS OF BREATH *UNUSUAL BRUISING OR BLEEDING *URINARY PROBLEMS (pain or burning when urinating, or frequent urination) *BOWEL PROBLEMS (unusual diarrhea, constipation, pain near the anus) TENDERNESS IN MOUTH AND THROAT WITH OR WITHOUT PRESENCE OF ULCERS (sore throat, sores in mouth, or a toothache) UNUSUAL RASH, SWELLING OR PAIN  UNUSUAL VAGINAL DISCHARGE OR ITCHING   Items with * indicate a potential emergency and should be followed up as soon as possible or go to the Emergency Department if any problems should occur.  Please show the CHEMOTHERAPY ALERT CARD or IMMUNOTHERAPY ALERT CARD at check-in  to the Emergency Department and triage nurse.  Should you have questions after your visit or need to cancel or reschedule your appointment, please contact [facility]  [phone] and follow the prompts.  Office hours are 8:00 a.m. to 4:30 p.m. Monday - Friday. Please note that voicemails left after 4:00 p.m. may not be returned until the following business day.  We are closed weekends and major holidays. You have access to a nurse at all times for urgent questions. Please call the main number to the clinic [phone] and follow the prompts.  For any non-urgent questions, you may also contact your provider using MyChart. We now offer e-Visits for anyone 67 and older to request care online for non-urgent symptoms. For details visit [email or web address].   Also download the MyChart app! Go to the app store, search "MyChart", open the app, select [facility], and log in with your MyChart username and password.  Due to Covid, a mask is required upon entering the hospital/clinic. If you do not have a mask, one will be given to you upon arrival. For doctor visits, patients may have 1 support person aged 49 or older with them. For treatment visits, patients cannot have anyone with them due to current Covid guidelines and our immunocompromised population.   Etoposide, VP-16 injection What is this medication? ETOPOSIDE, VP-16 (e toe POE side) is a chemotherapy drug. It is used to treattesticular cancer, lung cancer, and other cancers. This medicine may be used for other purposes; ask your health care provider orpharmacist if you have questions. COMMON BRAND NAME(S): Etopophos, Toposar, VePesid What should  I tell my care team before I take this medication? They need to know if you have any of these conditions: infection kidney disease liver disease low blood counts, like low white cell, platelet, or red cell counts an unusual or allergic reaction to etoposide,  other medicines, foods, dyes, or preservatives pregnant or trying to get pregnant breast-feeding How should I use this medication? This medicine is for infusion into a vein. It is administered in a hospital orclinic by a specially trained health care professional. Talk to your pediatrician regarding the use of this medicine in children.Special care may be needed. Overdosage: If you think you have taken too much of this medicine contact apoison control center or emergency room at once. NOTE: This medicine is only for you. Do not share this medicine with others. What if I miss a dose? It is important not to miss your dose. Call your doctor or health careprofessional if you are unable to keep an appointment. What may interact with this medication? This medicine may interact with the following medications: warfarin This list may not describe all possible interactions. Give your health care provider a list of all the medicines, herbs, non-prescription drugs, or dietary supplements you use. Also tell them if you smoke, drink alcohol, or use illegaldrugs. Some items may interact with your medicine. What should I watch for while using this medication? Visit your doctor for checks on your progress. This drug may make you feel generally unwell. This is not uncommon, as chemotherapy can affect healthy cells as well as cancer cells. Report any side effects. Continue your course oftreatment even though you feel ill unless your doctor tells you to stop. In some cases, you may be given additional medicines to help with side effects.Follow all directions for their use. Call your doctor or health care professional for advice if you get a fever, chills or sore throat, or other symptoms of a cold or flu. Do not treat yourself. This drug decreases your body's ability to fight infections. Try toavoid being around people who are sick. This medicine may increase your risk to bruise or bleed. Call your doctor orhealth  care professional if you notice any unusual bleeding. Talk to your doctor about your risk of cancer. You may be more at risk forcertain types of cancers if you take this medicine. Do not become pregnant while taking this medicine or for at least 6 months after stopping it. Women should inform their doctor if they wish to become pregnant or think they might be pregnant. Women of child-bearing potential will need to have a negative pregnancy test before starting this medicine. There is a potential for serious side effects to an unborn child. Talk to your health care professional or pharmacist for more information. Do not breast-feed aninfant while taking this medicine. Men must use a latex condom during sexual contact with a woman while taking this medicine and for at least 4 months after stopping it. A latex condom is needed even if you have had a vasectomy. Contact your doctor right away if your partner becomes pregnant. Do not donate sperm while taking this medicine and for at least 4 months after you stop taking this medicine. Men should inform their doctors if they wish to father a child. This medicine may lower spermcounts. What side effects may I notice from receiving this medication? Side effects that you should report to your doctor or health care professionalas soon as possible: allergic reactions like skin rash, itching or hives, swelling of  the face, lips, or tongue low blood counts - this medicine may decrease the number of white blood cells, red blood cells, and platelets. You may be at increased risk for infections and bleeding nausea, vomiting redness, blistering, peeling or loosening of the skin, including inside the mouth signs and symptoms of infection like fever; chills; cough; sore throat; pain or trouble passing urine signs and symptoms of low red blood cells or anemia such as unusually weak or tired; feeling faint or lightheaded; falls; breathing problems unusual bruising or  bleeding Side effects that usually do not require medical attention (report to yourdoctor or health care professional if they continue or are bothersome): changes in taste diarrhea hair loss loss of appetite mouth sores This list may not describe all possible side effects. Call your doctor for medical advice about side effects. You may report side effects to FDA at1-800-FDA-1088. Where should I keep my medication? This drug is given in a hospital or clinic and will not be stored at home. NOTE: This sheet is a summary. It may not cover all possible information. If you have questions about this medicine, talk to your doctor, pharmacist, orhealth care provider.  2022 Elsevier/Gold Standard (2018-10-04 16:57:15)

## 2021-03-13 ENCOUNTER — Ambulatory Visit: Payer: Medicare Other

## 2021-03-14 LAB — URINE CULTURE: Culture: 40000 — AB

## 2021-03-16 ENCOUNTER — Other Ambulatory Visit: Payer: Self-pay | Admitting: Oncology

## 2021-03-18 ENCOUNTER — Other Ambulatory Visit: Payer: Self-pay

## 2021-03-18 ENCOUNTER — Telehealth: Payer: Self-pay | Admitting: *Deleted

## 2021-03-18 ENCOUNTER — Inpatient Hospital Stay: Payer: Medicare Other

## 2021-03-18 ENCOUNTER — Inpatient Hospital Stay (HOSPITAL_BASED_OUTPATIENT_CLINIC_OR_DEPARTMENT_OTHER): Payer: Medicare Other | Admitting: Hospice and Palliative Medicine

## 2021-03-18 VITALS — BP 159/61 | HR 77 | Temp 98.7°F | Resp 18

## 2021-03-18 DIAGNOSIS — D701 Agranulocytosis secondary to cancer chemotherapy: Secondary | ICD-10-CM | POA: Diagnosis not present

## 2021-03-18 DIAGNOSIS — C3490 Malignant neoplasm of unspecified part of unspecified bronchus or lung: Secondary | ICD-10-CM | POA: Diagnosis not present

## 2021-03-18 DIAGNOSIS — R5383 Other fatigue: Secondary | ICD-10-CM | POA: Diagnosis not present

## 2021-03-18 DIAGNOSIS — Z5112 Encounter for antineoplastic immunotherapy: Secondary | ICD-10-CM | POA: Diagnosis not present

## 2021-03-18 DIAGNOSIS — T451X5A Adverse effect of antineoplastic and immunosuppressive drugs, initial encounter: Secondary | ICD-10-CM

## 2021-03-18 LAB — CBC WITH DIFFERENTIAL/PLATELET
Abs Immature Granulocytes: 0.06 10*3/uL (ref 0.00–0.07)
Basophils Absolute: 0 10*3/uL (ref 0.0–0.1)
Basophils Relative: 3 %
Eosinophils Absolute: 0 10*3/uL (ref 0.0–0.5)
Eosinophils Relative: 0 %
HCT: 20.2 % — ABNORMAL LOW (ref 36.0–46.0)
Hemoglobin: 6.7 g/dL — ABNORMAL LOW (ref 12.0–15.0)
Immature Granulocytes: 8 %
Lymphocytes Relative: 53 %
Lymphs Abs: 0.4 10*3/uL — ABNORMAL LOW (ref 0.7–4.0)
MCH: 32.2 pg (ref 26.0–34.0)
MCHC: 33.2 g/dL (ref 30.0–36.0)
MCV: 97.1 fL (ref 80.0–100.0)
Monocytes Absolute: 0.1 10*3/uL (ref 0.1–1.0)
Monocytes Relative: 9 %
Neutro Abs: 0.2 10*3/uL — CL (ref 1.7–7.7)
Neutrophils Relative %: 27 %
Platelets: 38 10*3/uL — ABNORMAL LOW (ref 150–400)
RBC: 2.08 MIL/uL — ABNORMAL LOW (ref 3.87–5.11)
RDW: 19.3 % — ABNORMAL HIGH (ref 11.5–15.5)
WBC: 0.8 10*3/uL — CL (ref 4.0–10.5)
nRBC: 0 % (ref 0.0–0.2)

## 2021-03-18 LAB — COMPREHENSIVE METABOLIC PANEL
ALT: 12 U/L (ref 0–44)
AST: 15 U/L (ref 15–41)
Albumin: 3.1 g/dL — ABNORMAL LOW (ref 3.5–5.0)
Alkaline Phosphatase: 50 U/L (ref 38–126)
Anion gap: 5 (ref 5–15)
BUN: 20 mg/dL (ref 8–23)
CO2: 20 mmol/L — ABNORMAL LOW (ref 22–32)
Calcium: 8 mg/dL — ABNORMAL LOW (ref 8.9–10.3)
Chloride: 101 mmol/L (ref 98–111)
Creatinine, Ser: 0.65 mg/dL (ref 0.44–1.00)
GFR, Estimated: >60 mL/min (ref >60)
Glucose, Bld: 114 mg/dL — ABNORMAL HIGH (ref 70–99)
Potassium: 3.9 mmol/L (ref 3.5–5.1)
Sodium: 126 mmol/L — ABNORMAL LOW (ref 135–145)
Total Bilirubin: 0.6 mg/dL (ref 0.3–1.2)
Total Protein: 5.4 g/dL — ABNORMAL LOW (ref 6.5–8.1)

## 2021-03-18 MED ORDER — SODIUM CHLORIDE 0.9 % IV SOLN
INTRAVENOUS | Status: DC
  Filled 2021-03-18 (×2): qty 250

## 2021-03-18 MED ORDER — HEPARIN SOD (PORK) LOCK FLUSH 100 UNIT/ML IV SOLN
500.0000 [IU] | Freq: Once | INTRAVENOUS | Status: AC
  Administered 2021-03-18: 500 [IU] via INTRAVENOUS
  Filled 2021-03-18: qty 5

## 2021-03-18 MED ORDER — DOXYCYCLINE MONOHYDRATE 100 MG PO TABS: 100.0000 mg | ORAL_TABLET | Freq: Two times a day (BID) | ORAL | 0 refills | Status: DC

## 2021-03-18 NOTE — Progress Notes (Signed)
Symptom Management [facility]  Telephone:([phone] [phone]  Patient Care Team: Idelle Crouch, MD as PCP - General (Internal Medicine) Telford Nab, RN as Oncology Nurse Navigator Earlie Server, MD as Consulting Physician (Hematology and Oncology)   Name of the patient: Beth Mcmillan  [id number]  1946-06-01   Date of visit: 03/18/21  Reason for Consult:  Ms. 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  Patient was recently hospitalized 02/02/2021-02/05/2021 for suspected sepsis, although a clear source of infection was never identified.  She was again hospitalized 02/07/2021-02/08/2021 with altered mental status thought secondary to delirium from lack of sleep.  This resolved quickly with use of as needed Ativan.  Patient was also treated for right lower extremity cellulitis.    Patient saw Dr. Janese Banks on 03/10/2021 for carbo/topside/Tecentriq chemotherapy.  She had some fatigue and nausea but was otherwise at baseline.  UA and culture was checked due to urinary frequency.  UA was normal but patient was found to have E faecalis and culture, which was presumably contamination.  Patient presents to [facility] today, with worsening fatigue/weakness since she received chemotherapy last week.  She requests IV fluids.  She also has had loose stools since last chemotherapy.  Loose stool frequency is 1-2 a day.  However, patient denies actual diarrhea, fever, chills, bleeding, or other symptomatic complaints.  Nausea has resolved.  PAST MEDICAL HISTORY: Past Medical History:  Diagnosis Date   Anxiety    Breathing problem    low breathing function 53%   COPD (chronic obstructive pulmonary disease) (HCC)    EMPHYSEMA   Dysrhythmia    tachycardia   Emphysema of lung (HCC)    Hemorrhoid    History of hiatal hernia     SMALL- 2022   Hypercholesteremia    Hypertension    Hypothyroidism    Larynx polyp    Lung cancer (HCC)    Palpitations    with anxiety   Platelets decreased ([facility]) 2020   Seasonal allergies    Tinnitus    Vertigo    no episodes in several years   Wears dentures    partial lower   White coat syndrome with hypertension     PAST SURGICAL HISTORY:  Past Surgical History:  Procedure Laterality Date   ABDOMINAL HYSTERECTOMY     partial   COLONOSCOPY WITH PROPOFOL N/A 01/22/2016   Procedure: COLONOSCOPY WITH PROPOFOL;  Surgeon: Lucilla Lame, MD;  Location: [facility];  Service: Endoscopy;  Laterality: N/A;   COLONOSCOPY WITH PROPOFOL N/A 04/12/2019   Procedure: COLONOSCOPY WITH BIOPSY;  Surgeon: Lucilla Lame, MD;  Location: [facility];  Service: Endoscopy;  Laterality: N/A;  pt would like an early appt   ct guided lung bx     IR IMAGING GUIDED PORT INSERTION  12/18/2020   POLYPECTOMY  01/22/2016   Procedure: POLYPECTOMY;  Surgeon: Lucilla Lame, MD;  Location: [facility];  Service: Endoscopy;;   POLYPECTOMY N/A 04/12/2019   Procedure: POLYPECTOMY;  Surgeon: Lucilla Lame, MD;  Location: [facility];  Service: Endoscopy;  Laterality: N/A;   VIDEO BRONCHOSCOPY WITH ENDOBRONCHIAL NAVIGATION N/A 11/21/2020   Procedure: VIDEO BRONCHOSCOPY WITH ENDOBRONCHIAL NAVIGATION;  Surgeon: Ottie Glazier, MD;  Location: [facility];  Service: Thoracic;  Laterality: N/A;   VIDEO BRONCHOSCOPY WITH ENDOBRONCHIAL ULTRASOUND N/A 11/21/2020   Procedure: VIDEO BRONCHOSCOPY  WITH ENDOBRONCHIAL ULTRASOUND;  Surgeon: Ottie Glazier, MD;  Location: [facility];  Service: Thoracic;  Laterality: N/A;    HEMATOLOGY/ONCOLOGY HISTORY:  Oncology History  Small cell lung cancer in adult ([facility])  12/13/2020 Initial Diagnosis   Small cell lung cancer in adult [facility])   12/22/2020 -  Chemotherapy    Patient is on Treatment Plan: LUNG SCLC CARBOPLATIN + ETOPOSIDE + ATEZOLIZUMAB INDUCTION Q21D /  ATEZOLIZUMAB MAINTENANCE Q21D      12/22/2020 Cancer Staging   Staging form: Lung, AJCC 8th Edition - Clinical stage from 12/22/2020: Stage IV (cT2, cN0, cM1) - Signed by Sindy Guadeloupe, MD on 12/22/2020     ALLERGIES:  is allergic to augmentin [amoxicillin-pot clavulanate], codeine, levaquin [levofloxacin], sulfa antibiotics, vancomycin, and tape.  MEDICATIONS:  Current Outpatient Medications  Medication Sig Dispense Refill   acetaminophen (TYLENOL) 500 MG tablet Take 500 mg by mouth every 6 (six) hours as needed for mild pain or moderate pain.     atorvastatin (LIPITOR) 10 MG tablet TAKE 1 TABLET BY MOUTH DAILY 90 tablet 0   Fluticasone-Umeclidin-Vilant (TRELEGY ELLIPTA) 100-62.5-25 MCG/INH AEPB Inhale 1 puff into the lungs daily. 1 each 2   ketotifen (ZADITOR) 0.025 % ophthalmic solution Place 1 drop into both eyes 2 (two) times daily as needed.     levothyroxine (SYNTHROID, LEVOTHROID) 50 MCG tablet Take 50 mcg by mouth daily before breakfast.     loperamide (IMODIUM) 2 MG capsule Take 2 mg by mouth as needed for diarrhea or loose stools.     loratadine (CLARITIN) 10 MG tablet Take 10 mg by mouth daily. Reported on 01/22/2016     LORazepam (ATIVAN) 0.5 MG tablet Take 1 tablet (0.5 mg total) by mouth every 6 (six) hours as needed for anxiety. (Patient not taking: Reported on 03/10/2021) 120 tablet 0   losartan-hydrochlorothiazide (HYZAAR) 100-25 MG tablet Take 1 tablet by mouth daily.     magic mouthwash w/lidocaine SOLN Take 5 mLs by mouth 4 (four) times daily as needed for mouth pain. (Patient not taking: Reported on 03/10/2021) 240 mL 0   magnesium chloride (SLOW-MAG) 64 MG TBEC SR tablet Take 1 tablet (64 mg total) by mouth daily. 30 tablet 0   metoCLOPramide (REGLAN) 10 MG tablet Take 1 tablet (10 mg total) by mouth 4 (four) times daily -  before meals and at bedtime. 120 tablet 0   metoprolol succinate (TOPROL-XL) 50 MG 24 hr tablet Take 50 mg by mouth daily.     nitrofurantoin,  macrocrystal-monohydrate, (MACROBID) 100 MG capsule Take 1 capsule (100 mg total) by mouth 2 (two) times daily. 10 capsule 0   nystatin (MYCOSTATIN) 100000 UNIT/ML suspension SHAKE LIQUID AND TAKE 5 ML BY MOUTH FOUR TIMES DAILY 60 mL 0   ondansetron (ZOFRAN) 8 MG tablet TAKE 1 TABLET BY MOUTH TWICE DAILY AS NEEDED FOR REFRACTORY NAUSEA/ VOMITING. START ON DAY 3 AFTER CARBOPLATIN CHEMO 30 tablet 3   PARoxetine (PAXIL) 10 MG tablet Take 5 mg by mouth every evening.     polyethylene glycol (MIRALAX / GLYCOLAX) 17 g packet Take 17 g by mouth daily as needed. (Patient not taking: Reported on 03/10/2021)     potassium chloride SA (KLOR-CON) 20 MEQ tablet TAKE 1 TABLET(20 MEQ) BY MOUTH DAILY 28 tablet 0   prochlorperazine (COMPAZINE) 10 MG tablet Take 10 mg by mouth as needed for nausea or vomiting.     QUEtiapine (SEROQUEL) 25 MG tablet Take 1 tablet (25 mg total) by mouth at bedtime. 30 tablet  2   sodium chloride 1 g tablet Take 1 tablet (1 g total) by mouth 3 (three) times daily. 42 tablet 0   No current facility-administered medications for this visit.   Facility-Administered Medications Ordered in Other Visits  Medication Dose Route Frequency Provider Last Rate Last Admin   0.9 %  sodium chloride infusion   Intravenous Continuous Jesus Poplin, Kirt Boys, NP 999 mL/hr at 03/18/21 1532 New Bag at 03/18/21 1532    VITAL SIGNS: BP (!) 159/61   Pulse 77   Temp 98.7 F (37.1 C) (Tympanic)   Resp 18   SpO2 100%  There were no vitals filed for this visit.   Estimated body mass index is 22.4 kg/m as calculated from the following:   Height as of 03/10/21: 5\' 7"  (1.702 m).   Weight as of 03/11/21: 143 lb (64.9 kg).  LABS: CBC:    Component Value Date/Time   WBC 0.8 (LL) 03/18/2021 1525   HGB 6.7 (L) 03/18/2021 1525   HGB 13.8 06/05/2013 1127   HCT 20.2 (L) 03/18/2021 1525   HCT 40.0 06/05/2013 1127   PLT 38 (L) 03/18/2021 1525   PLT 303 06/05/2013 1127   MCV 97.1 03/18/2021 1525   MCV 92  06/05/2013 1127   NEUTROABS 0.2 (LL) 03/18/2021 1525   LYMPHSABS 0.4 (L) 03/18/2021 1525   MONOABS 0.1 03/18/2021 1525   EOSABS 0.0 03/18/2021 1525   BASOSABS 0.0 03/18/2021 1525   Comprehensive Metabolic Panel:    Component Value Date/Time   NA 126 (L) 03/18/2021 1525   NA 131 (L) 06/05/2013 1127   K 3.9 03/18/2021 1525   K 3.0 (L) 06/05/2013 1127   CL 101 03/18/2021 1525   CL 96 (L) 06/05/2013 1127   CO2 20 (L) 03/18/2021 1525   CO2 26 06/05/2013 1127   BUN 20 03/18/2021 1525   BUN 10 06/05/2013 1127   CREATININE 0.65 03/18/2021 1525   CREATININE 0.94 06/05/2013 1127   GLUCOSE 114 (H) 03/18/2021 1525   GLUCOSE 127 (H) 06/05/2013 1127   CALCIUM 8.0 (L) 03/18/2021 1525   CALCIUM 9.1 06/05/2013 1127   AST 15 03/18/2021 1525   ALT 12 03/18/2021 1525   ALKPHOS 50 03/18/2021 1525   BILITOT 0.6 03/18/2021 1525   PROT 5.4 (L) 03/18/2021 1525   ALBUMIN 3.1 (L) 03/18/2021 1525    RADIOGRAPHIC STUDIES: No results found.  PERFORMANCE STATUS (ECOG) : 2 - Symptomatic, <50% confined to bed  Review of Systems Unless otherwise noted, a complete review of systems is negative.  Physical Exam General: NAD Cardiovascular: regular rate and rhythm Pulmonary: clear ant/posterior fields Abdomen: soft, nontender, + bowel sounds GU: no suprapubic tenderness Extremities: No lower extremity edema, no joint deformities Skin: Small scab to RLE Neurological: Weakness but otherwise nonfocal   Assessment and Plan- Patient is a 75 y.o. 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  Anemia is also probably contributing. Husband feels like patient overdid it yesterday exertionally as patient was visiting with family.  Patient given IV fluids today, which historically has helped her  symptomatically.  Anemia - Hg 6.7 today.  Discussed with Dr. Janese Banks and will plan on transfusion when patient returns on 03/20/2021  Neutropenia-ANC 0.2 today. Discussed with Dr. Janese Banks and will start patient on doxycycline prophylactically.  Diarrhea-patient having occasional loose stools, which is  likely secondary to chemotherapy.  Patient was also recently started on metoclopramide, which has completely resolved her nausea but could be contributing to loose stools.  Would recommend sending for stool culture/C. difficile if symptoms worsen.  Case and plan discussed with Dr. Janese Banks. Patient to follow-up with Rulon Abide, NP as previously scheduled on 03/20/2021  Patient expressed understanding and was in agreement with this plan. She also understands that She can call clinic at any time with any questions, concerns, or complaints.   Thank you for allowing me to participate in the care of this very pleasant patient.   Time Total: 30 minutes  Visit consisted of counseling and education dealing with the complex and emotionally intense issues of symptom management and palliative care in the setting of serious and potentially life-threatening illness.Greater than 50%  of this time was spent counseling and coordinating care related to the above assessment and plan.  Signed by: Altha Harm, PhD, NP-C

## 2021-03-18 NOTE — Telephone Encounter (Signed)
Daughter called reporting that patient is having weakness, fatigue and is shaking all over and is asking for patient to be evaluated. [place] a time to get to office. She states patient is s/p 3 cycles of chemotherapy

## 2021-03-19 ENCOUNTER — Encounter: Payer: Self-pay | Admitting: Oncology

## 2021-03-19 NOTE — Progress Notes (Signed)
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  Heme/Onc history: Patient is a 75 year old female with extensive smoking history and currently smokes half a pack per day.  She underwent CT chest with contrast in March 2022 following an abnormal x-ray which showed a 2.6 cm mass in the left lower lobe.  This was followed by a PET CT scan which showed a 3.0 x 2.0 cm mass in the left lower lobe of the lung with an SUV of 9.7.  No evidence of locoregional adenopathy.  No evidence of intra-abdominal disease with patient was noted to have a hypermetabolic focus in the right iliac bone with an SUV of 11.1 which was suspicious for metastatic disease.  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  Cells were positive for TTF-1, CD56 and chromogranin.  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  Patient completed whole brain radiation treatment and resumed carbo etoposide Tecentriq chemotherapy    Interval history-continues to have ongoing fatigue and weakness especially in her lower extremities.  Feels very "shaky".  She is here today  with her granddaughter.  Feels anxious.  Has intermittent nausea.  Appetite is low but she is able to drink 4-5 ensures daily.  She eats very little solid food.  No episodes of confusion.  Reports no falls.   ECOG PS- 1 Pain scale- 0 Opioid associated constipation- no  Review of systems- Review of Systems  Constitutional:  Positive for malaise/fatigue.  Respiratory:  Positive for wheezing (LUL).   Gastrointestinal:  Positive for nausea.  Neurological:  Positive for weakness.  Psychiatric/Behavioral:  The patient is nervous/anxious.       Allergies  Allergen Reactions   Augmentin [Amoxicillin-Pot Clavulanate]     "messes up my blood cells"   Codeine Nausea Only   Levaquin [Levofloxacin]     Delusions    Sulfa Antibiotics Nausea And Vomiting   Vancomycin Other (See Comments)    Redman syndome- happened in the hospital at [facility]   Tape Rash    Some bandaids cause rash sometimes     Past Medical History:  Diagnosis Date   Anxiety    Breathing problem    low breathing function 53%   COPD (chronic obstructive pulmonary disease) (HCC)    EMPHYSEMA   Dysrhythmia    tachycardia   Emphysema of lung (HCC)    Hemorrhoid    History of hiatal hernia    SMALL- 2022   Hypercholesteremia    Hypertension    Hypothyroidism    Larynx polyp    Lung cancer (HCC)    Palpitations    with anxiety   Platelets decreased ([place]) 2020   Seasonal allergies    Tinnitus  Vertigo    no episodes in several years   Wears dentures    partial lower   White coat syndrome with hypertension      Past Surgical History:  Procedure Laterality Date   ABDOMINAL HYSTERECTOMY     partial   COLONOSCOPY WITH PROPOFOL N/A 01/22/2016   Procedure: COLONOSCOPY WITH PROPOFOL;  Surgeon: Lucilla Lame, MD;  Location: [place];  Service: Endoscopy;  Laterality: N/A;   COLONOSCOPY WITH PROPOFOL N/A 04/12/2019   Procedure: COLONOSCOPY WITH BIOPSY;  Surgeon: Lucilla Lame, MD;  Location: [place];  Service: Endoscopy;  Laterality: N/A;  pt would like an early appt   ct guided lung bx     IR IMAGING GUIDED PORT INSERTION  12/18/2020   POLYPECTOMY  01/22/2016   Procedure: POLYPECTOMY;  Surgeon: Lucilla Lame, MD;  Location: [place];  Service: Endoscopy;;   POLYPECTOMY N/A 04/12/2019   Procedure: POLYPECTOMY;  Surgeon: Lucilla Lame, MD;  Location: [place];  Service: Endoscopy;  Laterality: N/A;   VIDEO BRONCHOSCOPY WITH ENDOBRONCHIAL NAVIGATION N/A 11/21/2020   Procedure: VIDEO BRONCHOSCOPY WITH ENDOBRONCHIAL NAVIGATION;  Surgeon: Ottie Glazier, MD;  Location: [facility];  Service: Thoracic;  Laterality: N/A;   VIDEO BRONCHOSCOPY WITH ENDOBRONCHIAL ULTRASOUND N/A 11/21/2020   Procedure: VIDEO BRONCHOSCOPY WITH ENDOBRONCHIAL ULTRASOUND;  Surgeon: Ottie Glazier, MD;  Location: [facility];  Service: Thoracic;  Laterality: N/A;    Social History   Socioeconomic History   Marital status: Married    Spouse name: Not on file   Number of children: Not on file   Years of education: Not on file   Highest education level: Not on file  Occupational History   Not on file  Tobacco Use   Smoking status: Former    Packs/day: 0.50    Years: 50.00    Pack years: 25.00    Types: Cigarettes    Quit date: 11/03/2020    Years since quitting: 0.3   Smokeless tobacco: Never  Vaping Use   Vaping Use: Some days  Substance and Sexual Activity   Alcohol use: No   Drug use: Never   Sexual activity: Not on file  Other Topics Concern   Not on file  Social History Narrative   Not on file   Social Determinants of Health   Financial Resource Strain: Not on file  Food Insecurity: Not on file  Transportation Needs: Not on file  Physical Activity: Not on file  Stress: Not on file  Social Connections: Not on file  Intimate Partner Violence: Not on file    Family History  Problem Relation Age of Onset   Alzheimer's disease Mother    Heart attack Mother    High Cholesterol  Mother    Hypertension Mother    Heart block Mother    Lung cancer Father    Uterine cancer Sister    Hypertension Sister    Heart block Sister    Anxiety disorder Sister    Breast cancer Neg Hx      Current Outpatient Medications:    acetaminophen (TYLENOL) 500 MG tablet, Take 500 mg by mouth every 6 (six) hours as needed for mild pain or moderate pain., Disp: , Rfl:    atorvastatin (LIPITOR) 10 MG tablet, TAKE 1 TABLET BY MOUTH DAILY, Disp: 90 tablet, Rfl: 0   doxycycline (ADOXA) 100 MG tablet, Take 1 tablet (100 mg total) by mouth 2 (two) times daily., Disp: 14 tablet, Rfl: 0   Fluticasone-Umeclidin-Vilant (TRELEGY ELLIPTA)  100-62.5-25 MCG/INH AEPB, Inhale 1 puff into the lungs daily., Disp: 1 each, Rfl: 2   ketotifen (ZADITOR) 0.025 % ophthalmic solution, Place 1 drop into both eyes 2 (two) times daily as needed., Disp: , Rfl:    levothyroxine (SYNTHROID, LEVOTHROID) 50 MCG tablet, Take 50 mcg by mouth daily before breakfast., Disp: , Rfl:    loperamide (IMODIUM) 2 MG capsule, Take 2 mg by mouth as needed for diarrhea or loose stools., Disp: , Rfl:    loratadine (CLARITIN) 10 MG tablet, Take 10 mg by mouth daily. Reported on 01/22/2016, Disp: , Rfl:    LORazepam (ATIVAN) 0.5 MG tablet, Take 1 tablet (0.5 mg total) by mouth every 6 (six) hours as needed for anxiety., Disp: 120 tablet, Rfl: 0   losartan-hydrochlorothiazide (HYZAAR) 100-25 MG tablet, Take 1 tablet by mouth daily., Disp: , Rfl:    magnesium chloride (SLOW-MAG) 64 MG TBEC SR tablet, Take 1 tablet (64 mg total) by mouth daily., Disp: 30 tablet, Rfl: 0   metoCLOPramide (REGLAN) 10 MG tablet, Take 1 tablet (10 mg total) by mouth 4 (four) times daily -  before meals and at bedtime., Disp: 120 tablet, Rfl: 0   metoprolol succinate (TOPROL-XL) 50 MG 24 hr tablet, Take 50 mg by mouth daily., Disp: , Rfl:    nystatin (MYCOSTATIN) 100000 UNIT/ML suspension, SHAKE LIQUID AND TAKE 5 ML BY MOUTH FOUR TIMES DAILY, Disp: 60 mL, Rfl: 0    ondansetron (ZOFRAN) 8 MG tablet, TAKE 1 TABLET BY MOUTH TWICE DAILY AS NEEDED FOR REFRACTORY NAUSEA/ VOMITING. START ON DAY 3 AFTER CARBOPLATIN CHEMO, Disp: 30 tablet, Rfl: 3   PARoxetine (PAXIL) 10 MG tablet, Take 5 mg by mouth every evening., Disp: , Rfl:    potassium chloride SA (KLOR-CON) 20 MEQ tablet, TAKE 1 TABLET(20 MEQ) BY MOUTH DAILY, Disp: 28 tablet, Rfl: 0   prochlorperazine (COMPAZINE) 10 MG tablet, Take 10 mg by mouth as needed for nausea or vomiting., Disp: , Rfl:    QUEtiapine (SEROQUEL) 25 MG tablet, Take 1 tablet (25 mg total) by mouth at bedtime., Disp: 30 tablet, Rfl: 2   sodium chloride 1 g tablet, Take 1 tablet (1 g total) by mouth 3 (three) times daily., Disp: 42 tablet, Rfl: 0   magic mouthwash w/lidocaine SOLN, Take 5 mLs by mouth 4 (four) times daily as needed for mouth pain. (Patient not taking: Reported on 03/20/2021), Disp: 240 mL, Rfl: 0   nitrofurantoin, macrocrystal-monohydrate, (MACROBID) 100 MG capsule, Take 1 capsule (100 mg total) by mouth 2 (two) times daily., Disp: 10 capsule, Rfl: 0   polyethylene glycol (MIRALAX / GLYCOLAX) 17 g packet, Take 17 g by mouth daily as needed. (Patient not taking: No sig reported), Disp: , Rfl:  No current facility-administered medications for this visit.  Facility-Administered Medications Ordered in Other Visits:    heparin lock flush 100 unit/mL, 500 Units, Intravenous, Once, Sindy Guadeloupe, MD   heparin lock flush 100 unit/mL, 500 Units, Intravenous, Once, Sindy Guadeloupe, MD   sodium chloride flush (NS) 0.9 % injection 10 mL, 10 mL, Intravenous, PRN, Sindy Guadeloupe, MD, 10 mL at 03/20/21 0829   sodium chloride flush (NS) 0.9 % injection 10 mL, 10 mL, Intravenous, PRN, Sindy Guadeloupe, MD  Physical exam:  Vitals:   03/20/21 0845  BP: (!) 157/57  Pulse: 77  Resp: 18  Temp: 99.1 F (37.3 C)  SpO2: 100%  Weight: 142 lb 0.3 oz (64.4 kg)   Physical Exam Constitutional:  Appearance: Normal appearance.  HENT:     Head:  Normocephalic and atraumatic.  Eyes:     Pupils: Pupils are equal, round, and reactive to light.  Cardiovascular:     Rate and Rhythm: Normal rate and regular rhythm.     Heart sounds: Normal heart sounds. No murmur heard. Pulmonary:     Effort: Pulmonary effort is normal.     Breath sounds: Normal breath sounds. No wheezing.  Abdominal:     General: Bowel sounds are normal. There is no distension.     Palpations: Abdomen is soft.     Tenderness: There is no abdominal tenderness.  Musculoskeletal:        General: Normal range of motion.     Cervical back: Normal range of motion.  Skin:    General: Skin is warm and dry.     Findings: No rash.  Neurological:     Mental Status: She is alert and oriented to person, place, and time.  Psychiatric:        Judgment: Judgment normal.     CMP Latest Ref Rng & Units 03/20/2021  Glucose 70 - 99 mg/dL 117(H)  BUN 8 - 23 mg/dL 13  Creatinine 0.44 - 1.00 mg/dL 0.68  Sodium 135 - 145 mmol/L 128(L)  Potassium 3.5 - 5.1 mmol/L 3.8  Chloride 98 - 111 mmol/L 97(L)  CO2 22 - 32 mmol/L 22  Calcium 8.9 - 10.3 mg/dL 9.0  Total Protein 6.5 - 8.1 g/dL 6.0(L)  Total Bilirubin 0.3 - 1.2 mg/dL 0.3  Alkaline Phos 38 - 126 U/L 58  AST 15 - 41 U/L 16  ALT 0 - 44 U/L 11   CBC Latest Ref Rng & Units 03/20/2021  WBC 4.0 - 10.5 K/uL 0.8(LL)  Hemoglobin 12.0 - 15.0 g/dL 7.2(L)  Hematocrit 36.0 - 46.0 % 21.0(L)  Platelets 150 - 400 K/uL 21(LL)     Assessment and plan- Patient is a 75 y.o. female with extensive stage small cell lung cancer and brain metastases.  She is s/p whole brain radiation treatment and here for blood transfusion due to significant anemia post cycle 4 chemotherapy.  She was evaluated 2 days ago for fatigue and found to have a hemoglobin of 6.7.  She was given IV fluids.  She was asked to return as scheduled today for blood and additional lab work.  She was started on doxycycline for prophylaxis neutropenia for 7 days.  Counts today  continue to be low.  Hemoglobin 7.2 (6.7) and white count is 800 with ANC of 200.  Platelets are 21,000.  Proceed with 1 unit packed red blood cells.  Bone metastasis-received Zometa last given on 02/18/2021.  Proceed with Zometa today.  Corrected calcium is 9.3.  She is scheduled for a CT CAP on 03/30/2021.  Chemo induced nausea-this is improving.  Continue either Zofran or Compazine plus Reglan.  Chemo induced anemia-1 unit PRBC today.  Anxiety-continue Ativan as needed.  We can increase her Paxil to 10 mg daily.  Severe leukopenia-she gets on pro Neulasta which she last had on 03/12/2021.  Counts should start to improve in the next couple days.  She is on prophylactic doxycycline.  She is scheduled for follow-up with Dr. Janese Banks on 03/31/2021 which she will keep.  She would like to go to the beach for a few days this next week.  Given her low counts, I recommend she return to clinic on Monday morning for repeat labs and possible IV fluids if she think  she needs them.  I will call her with her lab results to make sure that things are improving.  I spent 20 minutes dedicated to the care of this patient (face-to-face and non-face-to-face) on the date of the encounter to include what is described in the assessment and plan.  Faythe Casa, NP 03/20/2021 9:46 AM   Visit Diagnosis 1. Small cell lung cancer in adult [facility])   2. Other fatigue   3. Weakness

## 2021-03-20 ENCOUNTER — Other Ambulatory Visit: Payer: Self-pay

## 2021-03-20 ENCOUNTER — Inpatient Hospital Stay: Payer: Medicare Other

## 2021-03-20 ENCOUNTER — Inpatient Hospital Stay (HOSPITAL_BASED_OUTPATIENT_CLINIC_OR_DEPARTMENT_OTHER): Payer: Medicare Other | Admitting: Oncology

## 2021-03-20 ENCOUNTER — Other Ambulatory Visit: Payer: Self-pay | Admitting: Oncology

## 2021-03-20 ENCOUNTER — Other Ambulatory Visit: Payer: Self-pay | Admitting: *Deleted

## 2021-03-20 ENCOUNTER — Encounter: Payer: Self-pay | Admitting: Oncology

## 2021-03-20 VITALS — BP 160/76 | HR 76 | Temp 96.7°F | Resp 18

## 2021-03-20 VITALS — BP 157/57 | HR 77 | Temp 99.1°F | Resp 18 | Wt 142.0 lb

## 2021-03-20 DIAGNOSIS — R531 Weakness: Secondary | ICD-10-CM

## 2021-03-20 DIAGNOSIS — Z5112 Encounter for antineoplastic immunotherapy: Secondary | ICD-10-CM | POA: Diagnosis not present

## 2021-03-20 DIAGNOSIS — D649 Anemia, unspecified: Secondary | ICD-10-CM

## 2021-03-20 DIAGNOSIS — C7951 Secondary malignant neoplasm of bone: Secondary | ICD-10-CM

## 2021-03-20 DIAGNOSIS — R5383 Other fatigue: Secondary | ICD-10-CM

## 2021-03-20 DIAGNOSIS — C3490 Malignant neoplasm of unspecified part of unspecified bronchus or lung: Secondary | ICD-10-CM

## 2021-03-20 LAB — CBC WITH DIFFERENTIAL/PLATELET
Abs Immature Granulocytes: 0 10*3/uL (ref 0.00–0.07)
Basophils Absolute: 0 10*3/uL (ref 0.0–0.1)
Basophils Relative: 3 %
Eosinophils Absolute: 0 10*3/uL (ref 0.0–0.5)
Eosinophils Relative: 0 %
HCT: 21 % — ABNORMAL LOW (ref 36.0–46.0)
Hemoglobin: 7.2 g/dL — ABNORMAL LOW (ref 12.0–15.0)
Immature Granulocytes: 0 %
Lymphocytes Relative: 54 %
Lymphs Abs: 0.4 10*3/uL — ABNORMAL LOW (ref 0.7–4.0)
MCH: 32.3 pg (ref 26.0–34.0)
MCHC: 34.3 g/dL (ref 30.0–36.0)
MCV: 94.2 fL (ref 80.0–100.0)
Monocytes Absolute: 0.1 10*3/uL (ref 0.1–1.0)
Monocytes Relative: 18 %
Neutro Abs: 0.2 10*3/uL — CL (ref 1.7–7.7)
Neutrophils Relative %: 25 %
Platelets: 21 10*3/uL — CL (ref 150–400)
RBC: 2.23 MIL/uL — ABNORMAL LOW (ref 3.87–5.11)
RDW: 18.9 % — ABNORMAL HIGH (ref 11.5–15.5)
Smear Review: NORMAL
WBC: 0.8 10*3/uL — CL (ref 4.0–10.5)
nRBC: 0 % (ref 0.0–0.2)

## 2021-03-20 LAB — COMPREHENSIVE METABOLIC PANEL
ALT: 11 U/L (ref 0–44)
AST: 16 U/L (ref 15–41)
Albumin: 3.6 g/dL (ref 3.5–5.0)
Alkaline Phosphatase: 58 U/L (ref 38–126)
Anion gap: 9 (ref 5–15)
BUN: 13 mg/dL (ref 8–23)
CO2: 22 mmol/L (ref 22–32)
Calcium: 9 mg/dL (ref 8.9–10.3)
Chloride: 97 mmol/L — ABNORMAL LOW (ref 98–111)
Creatinine, Ser: 0.68 mg/dL (ref 0.44–1.00)
GFR, Estimated: >60 mL/min (ref >60)
Glucose, Bld: 117 mg/dL — ABNORMAL HIGH (ref 70–99)
Potassium: 3.8 mmol/L (ref 3.5–5.1)
Sodium: 128 mmol/L — ABNORMAL LOW (ref 135–145)
Total Bilirubin: 0.3 mg/dL (ref 0.3–1.2)
Total Protein: 6 g/dL — ABNORMAL LOW (ref 6.5–8.1)

## 2021-03-20 LAB — SAMPLE TO BLOOD BANK

## 2021-03-20 LAB — PREPARE RBC (CROSSMATCH)

## 2021-03-20 MED ORDER — SODIUM CHLORIDE 0.9% FLUSH
10.0000 mL | INTRAVENOUS | Status: DC | PRN
  Administered 2021-03-20: 10 mL via INTRAVENOUS
  Filled 2021-03-20: qty 10

## 2021-03-20 MED ORDER — HEPARIN SOD (PORK) LOCK FLUSH 100 UNIT/ML IV SOLN
500.0000 [IU] | Freq: Once | INTRAVENOUS | Status: AC
  Filled 2021-03-20: qty 5

## 2021-03-20 MED ORDER — SLOW-MAG 71.5-119 MG PO TBEC: 1.0000 | DELAYED_RELEASE_TABLET | Freq: Every day | ORAL | 0 refills | Status: DC

## 2021-03-20 MED ORDER — HEPARIN SOD (PORK) LOCK FLUSH 100 UNIT/ML IV SOLN
500.0000 [IU] | Freq: Once | INTRAVENOUS | Status: AC
  Administered 2021-03-20: 500 [IU] via INTRAVENOUS
  Filled 2021-03-20: qty 5

## 2021-03-20 MED ORDER — SODIUM CHLORIDE 0.9 % IV SOLN
Freq: Once | INTRAVENOUS | Status: AC
  Filled 2021-03-20: qty 250

## 2021-03-20 MED ORDER — ACETAMINOPHEN 325 MG PO TABS
650.0000 mg | ORAL_TABLET | Freq: Once | ORAL | Status: AC
  Administered 2021-03-20: 650 mg via ORAL
  Filled 2021-03-20: qty 2

## 2021-03-20 MED ORDER — HEPARIN SOD (PORK) LOCK FLUSH 100 UNIT/ML IV SOLN
INTRAVENOUS | Status: AC
  Filled 2021-03-20: qty 5

## 2021-03-20 MED ORDER — SODIUM CHLORIDE 1 G PO TABS: 1.0000 g | ORAL_TABLET | Freq: Three times a day (TID) | ORAL | 0 refills | Status: DC

## 2021-03-20 MED ORDER — SODIUM CHLORIDE 0.9% FLUSH
10.0000 mL | INTRAVENOUS | Status: DC | PRN
  Filled 2021-03-20: qty 10

## 2021-03-20 MED ORDER — SODIUM CHLORIDE 0.9% IV SOLUTION
250.0000 mL | Freq: Once | INTRAVENOUS | Status: AC
  Administered 2021-03-20: 250 mL via INTRAVENOUS
  Filled 2021-03-20: qty 250

## 2021-03-20 MED ORDER — ZOLEDRONIC ACID 4 MG/5ML IV CONC
3.5000 mg | INTRAVENOUS | Status: DC
  Administered 2021-03-20: 3.5 mg via INTRAVENOUS
  Filled 2021-03-20: qty 4.38

## 2021-03-20 MED ORDER — SODIUM CHLORIDE 0.9% FLUSH
3.0000 mL | INTRAVENOUS | Status: DC | PRN
  Filled 2021-03-20: qty 3

## 2021-03-20 MED ORDER — SLOW-MAG 71.5-119 MG PO TBEC: 1.0000 | DELAYED_RELEASE_TABLET | Freq: Every day | ORAL | 0 refills | Status: AC

## 2021-03-20 NOTE — Patient Instructions (Signed)
[facility]  Discharge Instructions: Thank you for choosing [facility] to provide your oncology and hematology care.  If you have a lab appointment with the [facility], please go directly to the [facility] and check in at the registration area.  Wear comfortable clothing and clothing appropriate for easy access to any Portacath or PICC line.   We strive to give you quality time with your provider. You may need to reschedule your appointment if you arrive late (15 or more minutes).  Arriving late affects you and other patients whose appointments are after yours.  Also, if you miss three or more appointments without notifying the office, you may be dismissed from the clinic at the provider's discretion.      For prescription refill requests, have your pharmacy contact our office and allow 72 hours for refills to be completed.    Today you received the following chemotherapy and/or immunotherapy agents blood and zometa      To help prevent nausea and vomiting after your treatment, we encourage you to take your nausea medication as directed.  BELOW ARE SYMPTOMS THAT SHOULD BE REPORTED IMMEDIATELY: *FEVER GREATER THAN 100.4 F (38 C) OR HIGHER *CHILLS OR SWEATING *NAUSEA AND VOMITING THAT IS NOT CONTROLLED WITH YOUR NAUSEA MEDICATION *UNUSUAL SHORTNESS OF BREATH *UNUSUAL BRUISING OR BLEEDING *URINARY PROBLEMS (pain or burning when urinating, or frequent urination) *BOWEL PROBLEMS (unusual diarrhea, constipation, pain near the anus) TENDERNESS IN MOUTH AND THROAT WITH OR WITHOUT PRESENCE OF ULCERS (sore throat, sores in mouth, or a toothache) UNUSUAL RASH, SWELLING OR PAIN  UNUSUAL VAGINAL DISCHARGE OR ITCHING   Items with * indicate a potential emergency and should be followed up as soon as possible or go to the Emergency Department if any problems should occur.  Please show the CHEMOTHERAPY ALERT CARD or IMMUNOTHERAPY ALERT CARD at  check-in to the Emergency Department and triage nurse.  Should you have questions after your visit or need to cancel or reschedule your appointment, please contact [facility]  [phone] and follow the prompts.  Office hours are 8:00 a.m. to 4:30 p.m. Monday - Friday. Please note that voicemails left after 4:00 p.m. may not be returned until the following business day.  We are closed weekends and major holidays. You have access to a nurse at all times for urgent questions. Please call the main number to the clinic [phone] and follow the prompts.  For any non-urgent questions, you may also contact your provider using MyChart. We now offer e-Visits for anyone 61 and older to request care online for non-urgent symptoms. For details visit [email or web address].   Also download the MyChart app! Go to the app store, search "MyChart", open the app, select [facility], and log in with your MyChart username and password.  Due to Covid, a mask is required upon entering the hospital/clinic. If you do not have a mask, one will be given to you upon arrival. For doctor visits, patients may have 1 support person aged 17 or older with them. For treatment visits, patients cannot have anyone with them due to current Covid guidelines and our immunocompromised population.   Blood Transfusion, Adult, Care After This sheet gives you information about how to care for yourself after your procedure. Your doctor may also give you more specific instructions. If youhave problems or questions, contact your doctor. What can I expect after the procedure? After the procedure, it is common to have: Bruising and  soreness at the IV site. A fever or chills on the day of the procedure. This may be your body's response to the new blood cells received. A headache. Follow these instructions at home: Insertion site care     Follow instructions from your doctor about how to take care of  your insertion site. This is where an IV tube was put into your vein. Make sure you: Wash your hands with soap and water before and after you change your bandage (dressing). If you cannot use soap and water, use hand sanitizer. Change your bandage as told by your doctor. Check your insertion site every day for signs of infection. Check for: Redness, swelling, or pain. Bleeding from the site. Warmth. Pus or a bad smell. General instructions Take over-the-counter and prescription medicines only as told by your doctor. Rest as told by your doctor. Go back to your normal activities as told by your doctor. Keep all follow-up visits as told by your doctor. This is important. Contact a doctor if: You have itching or red, swollen areas of skin (hives). You feel worried or nervous (anxious). You feel weak after doing your normal activities. You have redness, swelling, warmth, or pain around the insertion site. You have blood coming from the insertion site, and the blood does not stop with pressure. You have pus or a bad smell coming from the insertion site. Get help right away if: You have signs of a serious reaction. This may be coming from an allergy or the body's defense system (immune system). Signs include: Trouble breathing or shortness of breath. Swelling of the face or feeling warm (flushed). Fever or chills. Head, chest, or back pain. Dark pee (urine) or blood in the pee. Widespread rash. Fast heartbeat. Feeling dizzy or light-headed. You may receive your blood transfusion in an outpatient setting. If so, youwill be told whom to contact to report any reactions. These symptoms may be an emergency. Do not wait to see if the symptoms will go away. Get medical help right away. Call your local emergency services (911 in the [place] Do not drive yourself to the hospital. Summary Bruising and soreness at the IV site are common. Check your insertion site every day for signs of  infection. Rest as told by your doctor. Go back to your normal activities as told by your doctor. Get help right away if you have signs of a serious reaction. This information is not intended to replace advice given to you by your health care provider. Make sure you discuss any questions you have with your healthcare provider. Document Revised: 02/01/2019 Document Reviewed: 02/01/2019 Elsevier Patient Education  [employer].  Zoledronic Acid Injection (Hypercalcemia, Oncology) What is this medication? ZOLEDRONIC ACID (ZOE le dron ik AS id) slows calcium loss from bones. It high calcium levels in the blood from some kinds of cancer. It may be used in otherpeople at risk for bone loss. This medicine may be used for other purposes; ask your health care provider orpharmacist if you have questions. COMMON BRAND NAME(S): Zometa What should I tell my care team before I take this medication? They need to know if you have any of these conditions: cancer dehydration dental disease kidney disease liver disease low levels of calcium in the blood lung or breathing disease (asthma) receiving steroids like dexamethasone or prednisone an unusual or allergic reaction to zoledronic acid, other medicines, foods, dyes, or preservatives pregnant or trying to get pregnant breast-feeding How should I use this medication? This  drug is injected into a vein. It is given by a health care provider in [facility] or clinic setting. Talk to your health care provider about the use of this drug in children.Special care may be needed. Overdosage: If you think you have taken too much of this medicine contact apoison control center or emergency room at once. NOTE: This medicine is only for you. Do not share this medicine with others. What if I miss a dose? Keep appointments for follow-up doses. It is important not to miss your dose.Call your health care provider if you are unable to keep an appointment. What may  interact with this medication? certain antibiotics given by injection NSAIDs, medicines for pain and inflammation, like ibuprofen or naproxen some diuretics like bumetanide, furosemide teriparatide thalidomide This list may not describe all possible interactions. Give your health care provider a list of all the medicines, herbs, non-prescription drugs, or dietary supplements you use. Also tell them if you smoke, drink alcohol, or use illegaldrugs. Some items may interact with your medicine. What should I watch for while using this medication? Visit your health care provider for regular checks on your progress. It may besome time before you see the benefit from this drug. Some people who take this drug have severe bone, joint, or muscle pain. This drug may also increase your risk for jaw problems or a broken thigh bone. Tell your health care provider right away if you have severe pain in your jaw, bones, joints, or muscles. Tell you health care provider if you have any painthat does not go away or that gets worse. Tell your dentist and dental surgeon that you are taking this drug. You should not have major dental surgery while on this drug. See your dentist to have a dental exam and fix any dental problems before starting this drug. Take good care of your teeth while on this drug. Make sure you see your dentist forregular follow-up appointments. You should make sure you get enough calcium and vitamin D while you are taking this drug. Discuss the foods you eat and the vitamins you take with your healthcare provider. Check with your health care provider if you have severe diarrhea, nausea, and vomiting, or if you sweat a lot. The loss of too much body fluid may make itdangerous for you to take this drug. You may need blood work done while you are taking this drug. Do not become pregnant while taking this drug. Women should inform their health care provider if they wish to become pregnant or think they  might be pregnant. There is potential for serious harm to an unborn child. Talk to your healthcare provider for more information. What side effects may I notice from receiving this medication? Side effects that you should report to your doctor or health care provider assoon as possible: allergic reactions (skin rash, itching or hives; swelling of the face, lips, or tongue) bone pain infection (fever, chills, cough, sore throat, pain or trouble passing urine) jaw pain, especially after dental work joint pain kidney injury (trouble passing urine or change in the amount of urine) low blood pressure (dizziness; feeling faint or lightheaded, falls; unusually weak or tired) low calcium levels (fast heartbeat; muscle cramps or pain; pain, tingling, or numbness in the hands or feet; seizures) low magnesium levels (fast, irregular heartbeat; muscle cramp or pain; muscle weakness; tremors; seizures) low red blood cell counts (trouble breathing; feeling faint; lightheaded, falls; unusually weak or tired) muscle pain redness, blistering, peeling, or loosening of  the skin, including inside the mouth severe diarrhea swelling of the ankles, feet, hands trouble breathing Side effects that usually do not require medical attention (report to yourdoctor or health care provider if they continue or are bothersome): anxious constipation coughing depressed mood eye irritation, itching, or pain fever general ill feeling or flu-like symptoms nausea pain, redness, or irritation at site where injected trouble sleeping This list may not describe all possible side effects. Call your doctor for medical advice about side effects. You may report side effects to FDA at1-800-FDA-1088. Where should I keep my medication? This drug is given in a hospital or clinic. It will not be stored at home. NOTE: This sheet is a summary. It may not cover all possible information. If you have questions about this medicine, talk to  your doctor, pharmacist, orhealth care provider.  2022 Elsevier/Gold Standard (2019-05-24 09:13:00)

## 2021-03-21 LAB — BPAM RBC
Blood Product Expiration Date: 202208052359
ISSUE DATE / TIME: 202207291231
Unit Type and Rh: 5100

## 2021-03-21 LAB — TYPE AND SCREEN
ABO/RH(D): O POS
Antibody Screen: NEGATIVE
Unit division: 0

## 2021-03-23 ENCOUNTER — Inpatient Hospital Stay: Payer: Medicare Other | Attending: Oncology

## 2021-03-23 ENCOUNTER — Ambulatory Visit: Payer: Medicare Other

## 2021-03-23 ENCOUNTER — Inpatient Hospital Stay: Payer: Medicare Other

## 2021-03-23 ENCOUNTER — Other Ambulatory Visit: Payer: Self-pay

## 2021-03-23 VITALS — BP 160/65 | HR 66 | Temp 98.0°F | Resp 18

## 2021-03-23 DIAGNOSIS — Z87891 Personal history of nicotine dependence: Secondary | ICD-10-CM | POA: Insufficient documentation

## 2021-03-23 DIAGNOSIS — Z95828 Presence of other vascular implants and grafts: Secondary | ICD-10-CM

## 2021-03-23 DIAGNOSIS — E871 Hypo-osmolality and hyponatremia: Secondary | ICD-10-CM | POA: Insufficient documentation

## 2021-03-23 DIAGNOSIS — R197 Diarrhea, unspecified: Secondary | ICD-10-CM | POA: Insufficient documentation

## 2021-03-23 DIAGNOSIS — C7931 Secondary malignant neoplasm of brain: Secondary | ICD-10-CM

## 2021-03-23 DIAGNOSIS — K449 Diaphragmatic hernia without obstruction or gangrene: Secondary | ICD-10-CM | POA: Diagnosis not present

## 2021-03-23 DIAGNOSIS — I700 Atherosclerosis of aorta: Secondary | ICD-10-CM | POA: Insufficient documentation

## 2021-03-23 DIAGNOSIS — Z888 Allergy status to other drugs, medicaments and biological substances status: Secondary | ICD-10-CM | POA: Insufficient documentation

## 2021-03-23 DIAGNOSIS — R110 Nausea: Secondary | ICD-10-CM | POA: Diagnosis not present

## 2021-03-23 DIAGNOSIS — Z801 Family history of malignant neoplasm of trachea, bronchus and lung: Secondary | ICD-10-CM | POA: Insufficient documentation

## 2021-03-23 DIAGNOSIS — Z79899 Other long term (current) drug therapy: Secondary | ICD-10-CM | POA: Diagnosis not present

## 2021-03-23 DIAGNOSIS — Z882 Allergy status to sulfonamides status: Secondary | ICD-10-CM | POA: Diagnosis not present

## 2021-03-23 DIAGNOSIS — C3432 Malignant neoplasm of lower lobe, left bronchus or lung: Secondary | ICD-10-CM | POA: Insufficient documentation

## 2021-03-23 DIAGNOSIS — Z8349 Family history of other endocrine, nutritional and metabolic diseases: Secondary | ICD-10-CM | POA: Insufficient documentation

## 2021-03-23 DIAGNOSIS — C7951 Secondary malignant neoplasm of bone: Secondary | ICD-10-CM

## 2021-03-23 DIAGNOSIS — Z5112 Encounter for antineoplastic immunotherapy: Secondary | ICD-10-CM | POA: Diagnosis present

## 2021-03-23 DIAGNOSIS — J439 Emphysema, unspecified: Secondary | ICD-10-CM | POA: Diagnosis not present

## 2021-03-23 DIAGNOSIS — R5383 Other fatigue: Secondary | ICD-10-CM | POA: Insufficient documentation

## 2021-03-23 DIAGNOSIS — Z885 Allergy status to narcotic agent status: Secondary | ICD-10-CM | POA: Insufficient documentation

## 2021-03-23 DIAGNOSIS — Z881 Allergy status to other antibiotic agents status: Secondary | ICD-10-CM | POA: Insufficient documentation

## 2021-03-23 DIAGNOSIS — J432 Centrilobular emphysema: Secondary | ICD-10-CM | POA: Diagnosis not present

## 2021-03-23 DIAGNOSIS — Z880 Allergy status to penicillin: Secondary | ICD-10-CM | POA: Diagnosis not present

## 2021-03-23 DIAGNOSIS — C3490 Malignant neoplasm of unspecified part of unspecified bronchus or lung: Secondary | ICD-10-CM

## 2021-03-23 DIAGNOSIS — R109 Unspecified abdominal pain: Secondary | ICD-10-CM | POA: Insufficient documentation

## 2021-03-23 DIAGNOSIS — Z9221 Personal history of antineoplastic chemotherapy: Secondary | ICD-10-CM | POA: Insufficient documentation

## 2021-03-23 DIAGNOSIS — Z923 Personal history of irradiation: Secondary | ICD-10-CM | POA: Insufficient documentation

## 2021-03-23 DIAGNOSIS — R531 Weakness: Secondary | ICD-10-CM

## 2021-03-23 DIAGNOSIS — Z8719 Personal history of other diseases of the digestive system: Secondary | ICD-10-CM | POA: Diagnosis not present

## 2021-03-23 DIAGNOSIS — Z818 Family history of other mental and behavioral disorders: Secondary | ICD-10-CM | POA: Insufficient documentation

## 2021-03-23 DIAGNOSIS — Z8049 Family history of malignant neoplasm of other genital organs: Secondary | ICD-10-CM | POA: Insufficient documentation

## 2021-03-23 DIAGNOSIS — E876 Hypokalemia: Secondary | ICD-10-CM | POA: Diagnosis not present

## 2021-03-23 DIAGNOSIS — Z8249 Family history of ischemic heart disease and other diseases of the circulatory system: Secondary | ICD-10-CM | POA: Insufficient documentation

## 2021-03-23 DIAGNOSIS — D649 Anemia, unspecified: Secondary | ICD-10-CM

## 2021-03-23 LAB — CBC WITH DIFFERENTIAL/PLATELET
Abs Immature Granulocytes: 4.41 10*3/uL — ABNORMAL HIGH (ref 0.00–0.07)
Basophils Absolute: 0 10*3/uL (ref 0.0–0.1)
Basophils Relative: 0 %
Eosinophils Absolute: 0 10*3/uL (ref 0.0–0.5)
Eosinophils Relative: 0 %
HCT: 25.9 % — ABNORMAL LOW (ref 36.0–46.0)
Hemoglobin: 8.8 g/dL — ABNORMAL LOW (ref 12.0–15.0)
Immature Granulocytes: 18 %
Lymphocytes Relative: 7 %
Lymphs Abs: 1.8 10*3/uL (ref 0.7–4.0)
MCH: 31.3 pg (ref 26.0–34.0)
MCHC: 34 g/dL (ref 30.0–36.0)
MCV: 92.2 fL (ref 80.0–100.0)
Monocytes Absolute: 2.2 10*3/uL — ABNORMAL HIGH (ref 0.1–1.0)
Monocytes Relative: 9 %
Neutro Abs: 15.9 10*3/uL — ABNORMAL HIGH (ref 1.7–7.7)
Neutrophils Relative %: 66 %
Platelets: 43 10*3/uL — ABNORMAL LOW (ref 150–400)
RBC: 2.81 MIL/uL — ABNORMAL LOW (ref 3.87–5.11)
RDW: 21.9 % — ABNORMAL HIGH (ref 11.5–15.5)
Smear Review: NORMAL
WBC: 24.3 10*3/uL — ABNORMAL HIGH (ref 4.0–10.5)
nRBC: 0.6 % — ABNORMAL HIGH (ref 0.0–0.2)

## 2021-03-23 LAB — COMPREHENSIVE METABOLIC PANEL
ALT: 12 U/L (ref 0–44)
AST: 22 U/L (ref 15–41)
Albumin: 3.6 g/dL (ref 3.5–5.0)
Alkaline Phosphatase: 82 U/L (ref 38–126)
Anion gap: 10 (ref 5–15)
BUN: 14 mg/dL (ref 8–23)
CO2: 21 mmol/L — ABNORMAL LOW (ref 22–32)
Calcium: 8.3 mg/dL — ABNORMAL LOW (ref 8.9–10.3)
Chloride: 98 mmol/L (ref 98–111)
Creatinine, Ser: 0.85 mg/dL (ref 0.44–1.00)
GFR, Estimated: >60 mL/min (ref >60)
Glucose, Bld: 127 mg/dL — ABNORMAL HIGH (ref 70–99)
Potassium: 3.5 mmol/L (ref 3.5–5.1)
Sodium: 129 mmol/L — ABNORMAL LOW (ref 135–145)
Total Bilirubin: 0.4 mg/dL (ref 0.3–1.2)
Total Protein: 6.1 g/dL — ABNORMAL LOW (ref 6.5–8.1)

## 2021-03-23 MED ORDER — SODIUM CHLORIDE 0.9% FLUSH
10.0000 mL | Freq: Once | INTRAVENOUS | Status: AC
  Administered 2021-03-23: 10 mL via INTRAVENOUS
  Filled 2021-03-23: qty 10

## 2021-03-23 MED ORDER — SODIUM CHLORIDE 0.9 % IV SOLN: 8.0000 mg | Freq: Once | INTRAVENOUS | Status: DC

## 2021-03-23 MED ORDER — SODIUM CHLORIDE 0.9 % IV SOLN
Freq: Once | INTRAVENOUS | Status: AC
  Filled 2021-03-23: qty 250

## 2021-03-23 MED ORDER — HEPARIN SOD (PORK) LOCK FLUSH 100 UNIT/ML IV SOLN
500.0000 [IU] | Freq: Once | INTRAVENOUS | Status: AC
  Administered 2021-03-23: 500 [IU] via INTRAVENOUS
  Filled 2021-03-23: qty 5

## 2021-03-23 MED ORDER — HEPARIN SOD (PORK) LOCK FLUSH 100 UNIT/ML IV SOLN
INTRAVENOUS | Status: AC
  Filled 2021-03-23: qty 5

## 2021-03-23 MED ORDER — ONDANSETRON HCL 4 MG/2ML IJ SOLN
8.0000 mg | Freq: Once | INTRAMUSCULAR | Status: AC
  Administered 2021-03-23: 8 mg via INTRAVENOUS
  Filled 2021-03-23: qty 4

## 2021-03-23 NOTE — Progress Notes (Signed)
Patient tolerated IV fluids infusion well today with zofran, no concerns voiced. Patient states she feels better. Patient discharged with husband. AVS printed. Stable.

## 2021-03-25 ENCOUNTER — Emergency Department
Admission: EM | Admit: 2021-03-25 | Discharge: 2021-03-25 | Disposition: A | Discharge status: Home / Self Care | Payer: Medicare Other | Attending: Emergency Medicine | Admitting: Emergency Medicine

## 2021-03-25 ENCOUNTER — Other Ambulatory Visit: Payer: Self-pay

## 2021-03-25 ENCOUNTER — Telehealth: Payer: Self-pay | Admitting: *Deleted

## 2021-03-25 ENCOUNTER — Emergency Department: Payer: Medicare Other

## 2021-03-25 DIAGNOSIS — Z20822 Contact with and (suspected) exposure to covid-19: Secondary | ICD-10-CM | POA: Insufficient documentation

## 2021-03-25 DIAGNOSIS — E039 Hypothyroidism, unspecified: Secondary | ICD-10-CM | POA: Diagnosis not present

## 2021-03-25 DIAGNOSIS — Z85118 Personal history of other malignant neoplasm of bronchus and lung: Secondary | ICD-10-CM | POA: Diagnosis not present

## 2021-03-25 DIAGNOSIS — I10 Essential (primary) hypertension: Secondary | ICD-10-CM | POA: Insufficient documentation

## 2021-03-25 DIAGNOSIS — Z7951 Long term (current) use of inhaled steroids: Secondary | ICD-10-CM | POA: Diagnosis not present

## 2021-03-25 DIAGNOSIS — E876 Hypokalemia: Secondary | ICD-10-CM | POA: Insufficient documentation

## 2021-03-25 DIAGNOSIS — Z85038 Personal history of other malignant neoplasm of large intestine: Secondary | ICD-10-CM | POA: Insufficient documentation

## 2021-03-25 DIAGNOSIS — J449 Chronic obstructive pulmonary disease, unspecified: Secondary | ICD-10-CM | POA: Diagnosis not present

## 2021-03-25 DIAGNOSIS — Z87891 Personal history of nicotine dependence: Secondary | ICD-10-CM | POA: Insufficient documentation

## 2021-03-25 DIAGNOSIS — E860 Dehydration: Secondary | ICD-10-CM | POA: Insufficient documentation

## 2021-03-25 DIAGNOSIS — R112 Nausea with vomiting, unspecified: Secondary | ICD-10-CM | POA: Insufficient documentation

## 2021-03-25 DIAGNOSIS — R197 Diarrhea, unspecified: Secondary | ICD-10-CM | POA: Insufficient documentation

## 2021-03-25 DIAGNOSIS — Z79899 Other long term (current) drug therapy: Secondary | ICD-10-CM | POA: Diagnosis not present

## 2021-03-25 LAB — CBC WITH DIFFERENTIAL/PLATELET
Abs Immature Granulocytes: 8.1 10*3/uL — ABNORMAL HIGH (ref 0.00–0.07)
Basophils Absolute: 0 10*3/uL (ref 0.0–0.1)
Basophils Relative: 0 %
Eosinophils Absolute: 0 10*3/uL (ref 0.0–0.5)
Eosinophils Relative: 0 %
HCT: 29.1 % — ABNORMAL LOW (ref 36.0–46.0)
Hemoglobin: 9.9 g/dL — ABNORMAL LOW (ref 12.0–15.0)
Immature Granulocytes: 24 %
Lymphocytes Relative: 7 %
Lymphs Abs: 2.4 10*3/uL (ref 0.7–4.0)
MCH: 31.2 pg (ref 26.0–34.0)
MCHC: 34 g/dL (ref 30.0–36.0)
MCV: 91.8 fL (ref 80.0–100.0)
Monocytes Absolute: 2.7 10*3/uL — ABNORMAL HIGH (ref 0.1–1.0)
Monocytes Relative: 8 %
Neutro Abs: 21 10*3/uL — ABNORMAL HIGH (ref 1.7–7.7)
Neutrophils Relative %: 61 %
Platelets: 80 10*3/uL — ABNORMAL LOW (ref 150–400)
RBC: 3.17 MIL/uL — ABNORMAL LOW (ref 3.87–5.11)
RDW: 22.6 % — ABNORMAL HIGH (ref 11.5–15.5)
Smear Review: DECREASED
WBC: 34.1 10*3/uL — ABNORMAL HIGH (ref 4.0–10.5)
nRBC: 0.9 % — ABNORMAL HIGH (ref 0.0–0.2)

## 2021-03-25 LAB — COMPREHENSIVE METABOLIC PANEL
ALT: 12 U/L (ref 0–44)
AST: 18 U/L (ref 15–41)
Albumin: 3.9 g/dL (ref 3.5–5.0)
Alkaline Phosphatase: 107 U/L (ref 38–126)
Anion gap: 11 (ref 5–15)
BUN: 12 mg/dL (ref 8–23)
CO2: 23 mmol/L (ref 22–32)
Calcium: 8.7 mg/dL — ABNORMAL LOW (ref 8.9–10.3)
Chloride: 97 mmol/L — ABNORMAL LOW (ref 98–111)
Creatinine, Ser: 0.8 mg/dL (ref 0.44–1.00)
GFR, Estimated: >60 mL/min (ref >60)
Glucose, Bld: 112 mg/dL — ABNORMAL HIGH (ref 70–99)
Potassium: 3.3 mmol/L — ABNORMAL LOW (ref 3.5–5.1)
Sodium: 131 mmol/L — ABNORMAL LOW (ref 135–145)
Total Bilirubin: 0.7 mg/dL (ref 0.3–1.2)
Total Protein: 6.5 g/dL (ref 6.5–8.1)

## 2021-03-25 LAB — PROCALCITONIN: Procalcitonin: 0.21 ng/mL

## 2021-03-25 LAB — URINALYSIS, COMPLETE (UACMP) WITH MICROSCOPIC
Bilirubin Urine: NEGATIVE
Glucose, UA: NEGATIVE mg/dL
Ketones, ur: NEGATIVE mg/dL
Leukocytes,Ua: NEGATIVE
Nitrite: NEGATIVE
Protein, ur: NEGATIVE mg/dL
Specific Gravity, Urine: 1.004 — ABNORMAL LOW (ref 1.005–1.030)
pH: 6 (ref 5.0–8.0)

## 2021-03-25 LAB — RESP PANEL BY RT-PCR (FLU A&B, COVID) ARPGX2
Influenza A by PCR: NEGATIVE
Influenza B by PCR: NEGATIVE
SARS Coronavirus 2 by RT PCR: NEGATIVE

## 2021-03-25 LAB — LACTIC ACID, PLASMA: Lactic Acid, Venous: 1.6 mmol/L (ref 0.5–1.9)

## 2021-03-25 LAB — MAGNESIUM: Magnesium: 0.9 mg/dL — CL (ref 1.7–2.4)

## 2021-03-25 MED ORDER — POTASSIUM CHLORIDE CRYS ER 20 MEQ PO TBCR
40.0000 meq | EXTENDED_RELEASE_TABLET | Freq: Once | ORAL | Status: AC
  Administered 2021-03-25: 40 meq via ORAL
  Filled 2021-03-25: qty 2

## 2021-03-25 MED ORDER — POTASSIUM CHLORIDE 10 MEQ/100ML IV SOLN
10.0000 meq | INTRAVENOUS | Status: DC
  Administered 2021-03-25: 10 meq via INTRAVENOUS
  Filled 2021-03-25: qty 100

## 2021-03-25 MED ORDER — POTASSIUM CHLORIDE 10 MEQ/100ML IV SOLN: 10.0000 meq | INTRAVENOUS | Status: AC

## 2021-03-25 MED ORDER — MAGNESIUM SULFATE 50 % IJ SOLN
6.0000 g | Freq: Once | INTRAVENOUS | Status: AC
  Administered 2021-03-25: 6 g via INTRAVENOUS
  Filled 2021-03-25: qty 10

## 2021-03-25 MED ORDER — LACTATED RINGERS IV BOLUS
1000.0000 mL | Freq: Once | INTRAVENOUS | Status: AC
  Administered 2021-03-25: 1000 mL via INTRAVENOUS

## 2021-03-25 MED ORDER — IOHEXOL 350 MG/ML SOLN
75.0000 mL | Freq: Once | INTRAVENOUS | Status: AC | PRN
  Administered 2021-03-25: 75 mL via INTRAVENOUS
  Filled 2021-03-25: qty 75

## 2021-03-25 MED ORDER — MAGNESIUM SULFATE 4 GM/100ML IV SOLN: 4.0000 g | Freq: Once | INTRAVENOUS | Status: DC

## 2021-03-25 NOTE — ED Notes (Signed)
Patient has been up to bathroom x2 unable to provider stool sample. Provider aware.

## 2021-03-25 NOTE — Telephone Encounter (Signed)
Daughter called again and I informed her of discussion with Dr Janese Banks and that we do not have a provider available to see patient today or tomorrow and that she should go to ER or Urgent Care. She stated that they are not fans of Urgent Care so she will have her father take patient to ER and wants ER physician to call Dr Janese Banks I told her to be sure to tell her father for them to do that.

## 2021-03-25 NOTE — ED Provider Notes (Signed)
[facility] Emergency Department Provider Note  ____________________________________________   Event Date/Time   First MD Initiated Contact with Patient 03/25/21 1624     (approximate)  I have reviewed the triage vital signs and the nursing notes.   HISTORY  Chief Complaint Diarrhea   HPI Nikcole Eischeid is a 75 y.o. 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  Denies any blood in her vomit or stool.  She denies any urinary symptoms or abdominal pain but does note she is currently on antibiotics for a UTI recently diagnosed.  She denies any fevers, chest pain, back pain, shortness of breath, cough, headache, earache, sore throat, falls or injuries rashes dysphagia feels little tremulous and overall just weak.  Note she did receive some IV fluids in the clinic on 8/1 but feels that Helped a little bit and she is now feeling just as bad she did then.         Past Medical History:  Diagnosis Date   Anxiety    Breathing problem    low breathing function 53%   COPD (chronic obstructive pulmonary disease) (HCC)    EMPHYSEMA   Dysrhythmia    tachycardia   Emphysema of lung (HCC)    Hemorrhoid    History of hiatal hernia    SMALL- 2022   Hypercholesteremia    Hypertension    Hypothyroidism    Larynx polyp    Lung cancer ([place])    Palpitations    with anxiety   Platelets decreased ([place]) 2020   Seasonal allergies    Tinnitus    Vertigo    no episodes in several years   Wears dentures    partial lower   White coat syndrome with hypertension     Patient Active  Problem List   Diagnosis Date Noted   Encephalopathy 02/07/2021   Tobacco abuse 02/07/2021   Sepsis ([place]) 02/03/2021   Anemia associated with chemotherapy 02/03/2021   Thrombocytopenia ([place]) 02/03/2021   Hyponatremia 36/64/4034   Acute metabolic encephalopathy 74/25/9563   Generalized weakness 02/03/2021   Antineoplastic chemotherapy induced pancytopenia (HCC)    Bone metastases ([place]) 01/01/2021   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   Goals of care, counseling/discussion 12/13/2020   Small cell lung cancer in adult [facility]) 12/13/2020   Hypothyroidism, acquired 10/02/2019   GAD (generalized anxiety disorder) 10/02/2019   Personal history of colonic polyps    Polyp of transverse colon    Special screening for malignant neoplasms, colon    Benign neoplasm of cecum    Benign neoplasm of ascending colon     Past Surgical History:  Procedure Laterality Date   ABDOMINAL HYSTERECTOMY     partial   COLONOSCOPY WITH PROPOFOL N/A 01/22/2016   Procedure: COLONOSCOPY WITH PROPOFOL;  Surgeon: Lucilla Lame, MD;  Location: [place];  Service: Endoscopy;  Laterality: N/A;   COLONOSCOPY WITH PROPOFOL N/A 04/12/2019   Procedure: COLONOSCOPY WITH BIOPSY;  Surgeon: Lucilla Lame, MD;  Location: [place];  Service: Endoscopy;  Laterality: N/A;  pt would like an early appt   ct guided lung bx  IR IMAGING GUIDED PORT INSERTION  12/18/2020   POLYPECTOMY  01/22/2016   Procedure: POLYPECTOMY;  Surgeon: Lucilla Lame, MD;  Location: [place];  Service: Endoscopy;;   POLYPECTOMY N/A 04/12/2019   Procedure: POLYPECTOMY;  Surgeon: Lucilla Lame, MD;  Location: [place];  Service: Endoscopy;  Laterality: N/A;   VIDEO BRONCHOSCOPY WITH ENDOBRONCHIAL NAVIGATION N/A 11/21/2020   Procedure: VIDEO BRONCHOSCOPY WITH ENDOBRONCHIAL NAVIGATION;  Surgeon: Ottie Glazier, MD;  Location: [facility];  Service: Thoracic;  Laterality: N/A;   VIDEO BRONCHOSCOPY  WITH ENDOBRONCHIAL ULTRASOUND N/A 11/21/2020   Procedure: VIDEO BRONCHOSCOPY WITH ENDOBRONCHIAL ULTRASOUND;  Surgeon: Ottie Glazier, MD;  Location: [facility];  Service: Thoracic;  Laterality: N/A;    Prior to Admission medications   Medication Sig Start Date End Date Taking? Authorizing Provider  acetaminophen (TYLENOL) 500 MG tablet Take 500 mg by mouth every 6 (six) hours as needed for mild pain or moderate pain.    [provider]  atorvastatin (LIPITOR) 10 MG tablet TAKE 1 TABLET BY MOUTH DAILY 01/28/20   Cletis Athens, MD  doxycycline (ADOXA) 100 MG tablet Take 1 tablet (100 mg total) by mouth 2 (two) times daily. 03/18/21   Borders, Kirt Boys, NP  Fluticasone-Umeclidin-Vilant (TRELEGY ELLIPTA) 100-62.5-25 MCG/INH AEPB Inhale 1 puff into the lungs daily. 03/10/21   Sindy Guadeloupe, MD  ketotifen (ZADITOR) 0.025 % ophthalmic solution Place 1 drop into both eyes 2 (two) times daily as needed.    [provider]  levothyroxine (SYNTHROID, LEVOTHROID) 50 MCG tablet Take 50 mcg by mouth daily before breakfast.    [provider]  loperamide (IMODIUM) 2 MG capsule Take 2 mg by mouth as needed for diarrhea or loose stools.    [provider]  loratadine (CLARITIN) 10 MG tablet Take 10 mg by mouth daily. Reported on 01/22/2016    [provider]  LORazepam (ATIVAN) 0.5 MG tablet Take 1 tablet (0.5 mg total) by mouth every 6 (six) hours as needed for anxiety. 12/17/20   Sindy Guadeloupe, MD  losartan-hydrochlorothiazide (HYZAAR) 100-25 MG tablet Take 1 tablet by mouth daily.    [provider]  magic mouthwash w/lidocaine SOLN Take 5 mLs by mouth 4 (four) times daily as needed for mouth pain. Patient not taking: Reported on 03/20/2021 02/19/21   Sindy Guadeloupe, MD  magnesium chloride (SLOW-MAG) 64 MG TBEC SR tablet Take 1 tablet (64 mg total) by mouth daily. 03/20/21   Jacquelin Hawking, NP  metoCLOPramide (REGLAN) 10 MG tablet Take 1 tablet (10 mg total) by  mouth 4 (four) times daily -  before meals and at bedtime. 03/09/21   Sindy Guadeloupe, MD  metoprolol succinate (TOPROL-XL) 50 MG 24 hr tablet Take 50 mg by mouth daily. 07/01/14   [provider]  nitrofurantoin, macrocrystal-monohydrate, (MACROBID) 100 MG capsule Take 1 capsule (100 mg total) by mouth 2 (two) times daily. 03/06/21   Cammie Sickle, MD  nystatin (MYCOSTATIN) 100000 UNIT/ML suspension SHAKE LIQUID AND TAKE 5 ML BY MOUTH FOUR TIMES DAILY 03/06/21   Sindy Guadeloupe, MD  ondansetron (ZOFRAN) 8 MG tablet TAKE 1 TABLET BY MOUTH TWICE DAILY AS NEEDED FOR REFRACTORY NAUSEA/ VOMITING. START ON DAY 3 AFTER CARBOPLATIN CHEMO 03/10/21   Sindy Guadeloupe, MD  PARoxetine (PAXIL) 10 MG tablet Take 5 mg by mouth every evening.    [provider]  polyethylene glycol (MIRALAX / GLYCOLAX) 17 g packet Take 17 g by mouth daily as needed. Patient not  taking: No sig reported    [provider]  potassium chloride SA (KLOR-CON) 20 MEQ tablet TAKE 1 TABLET(20 MEQ) BY MOUTH DAILY 03/16/21   Sindy Guadeloupe, MD  prochlorperazine (COMPAZINE) 10 MG tablet Take 10 mg by mouth as needed for nausea or vomiting.    [provider]  QUEtiapine (SEROQUEL) 25 MG tablet Take 1 tablet (25 mg total) by mouth at bedtime. 02/24/21   Borders, Kirt Boys, NP  sodium chloride 1 g tablet Take 1 tablet (1 g total) by mouth 3 (three) times daily. 03/20/21   Jacquelin Hawking, NP    Allergies Augmentin [amoxicillin-pot clavulanate], Codeine, Levaquin [levofloxacin], Sulfa antibiotics, Vancomycin, and Tape  Family History  Problem Relation Age of Onset   Alzheimer's disease Mother    Heart attack Mother    High Cholesterol Mother    Hypertension Mother    Heart block Mother    Lung cancer Father    Uterine cancer Sister    Hypertension Sister    Heart block Sister    Anxiety disorder Sister    Breast cancer Neg Hx     Social History Social History   Tobacco Use   Smoking status:  Former    Packs/day: 0.50    Years: 50.00    Pack years: 25.00    Types: Cigarettes    Quit date: 11/03/2020    Years since quitting: 0.3   Smokeless tobacco: Never  Vaping Use   Vaping Use: Some days  Substance Use Topics   Alcohol use: No   Drug use: Never    Review of Systems  Review of Systems  Constitutional:  Positive for malaise/fatigue. Negative for chills and fever.  HENT:  Negative for sore throat.   Eyes:  Negative for pain.  Respiratory:  Negative for cough and stridor.   Cardiovascular:  Negative for chest pain.  Gastrointestinal:  Positive for diarrhea, nausea and vomiting.  Genitourinary:  Negative for dysuria.  Musculoskeletal:  Negative for myalgias.  Skin:  Negative for rash.  Neurological:  Positive for tremors and weakness. Negative for seizures, loss of consciousness and headaches.  Psychiatric/Behavioral:  Negative for suicidal ideas.   All other systems reviewed and are negative.    ____________________________________________   PHYSICAL EXAM:  VITAL SIGNS: ED Triage Vitals  Enc Vitals Group     BP 03/25/21 1612 (!) 107/96     Pulse Rate 03/25/21 1612 86     Resp 03/25/21 1612 16     Temp 03/25/21 1612 98.6 F (37 C)     Temp Source 03/25/21 1612 Oral     SpO2 03/25/21 1612 97 %     Weight 03/25/21 1612 139 lb (63 kg)     Height 03/25/21 1612 5\' 7"  (1.702 m)     Head Circumference --      Peak Flow --      Pain Score 03/25/21 1612 0     Pain Loc --      Pain Edu? --      Excl. in [place]? --    Vitals:   03/25/21 1612  BP: (!) 107/96  Pulse: 86  Resp: 16  Temp: 98.6 F (37 C)  SpO2: 97%   Physical Exam Vitals and nursing note reviewed.  Constitutional:      General: She is not in acute distress.    Appearance: She is well-developed. She is ill-appearing.  HENT:     Head: Normocephalic and atraumatic.     Right  Ear: External ear normal.     Left Ear: External ear normal.     Mouth/Throat:     Mouth: Mucous membranes are dry.   Eyes:     Conjunctiva/sclera: Conjunctivae normal.  Cardiovascular:     Rate and Rhythm: Normal rate and regular rhythm.     Pulses: Normal pulses.     Heart sounds: No murmur heard. Pulmonary:     Effort: Pulmonary effort is normal. No respiratory distress.     Breath sounds: Normal breath sounds.  Abdominal:     Palpations: Abdomen is soft.     Tenderness: There is no abdominal tenderness. There is no right CVA tenderness or left CVA tenderness.  Musculoskeletal:     Cervical back: Neck supple.  Skin:    General: Skin is warm and dry.     Capillary Refill: Capillary refill takes more than 3 seconds.  Neurological:     Mental Status: She is alert and oriented to person, place, and time.  Psychiatric:        Mood and Affect: Mood normal.     ____________________________________________   LABS (all labs ordered are listed, but only abnormal results are displayed)  Labs Reviewed  COMPREHENSIVE METABOLIC PANEL - Abnormal; Notable for the following components:      Result Value   Sodium 131 (*)    Potassium 3.3 (*)    Chloride 97 (*)    Glucose, Bld 112 (*)    Calcium 8.7 (*)    All other components within normal limits  URINALYSIS, COMPLETE (UACMP) WITH MICROSCOPIC - Abnormal; Notable for the following components:   Color, Urine STRAW (*)    APPearance HAZY (*)    Specific Gravity, Urine 1.004 (*)    Hgb urine dipstick SMALL (*)    Bacteria, UA RARE (*)    All other components within normal limits  CBC WITH DIFFERENTIAL/PLATELET - Abnormal; Notable for the following components:   WBC 34.1 (*)    RBC 3.17 (*)    Hemoglobin 9.9 (*)    HCT 29.1 (*)    RDW 22.6 (*)    Platelets 80 (*)    nRBC 0.9 (*)    Neutro Abs 21.0 (*)    Monocytes Absolute 2.7 (*)    Abs Immature Granulocytes 8.10 (*)    All other components within normal limits  MAGNESIUM - Abnormal; Notable for the following components:   Magnesium 0.9 (*)    All other components within normal limits  RESP  PANEL BY RT-PCR (FLU A&B, COVID) ARPGX2  GASTROINTESTINAL PANEL BY PCR, STOOL (REPLACES STOOL CULTURE)  C DIFFICILE QUICK SCREEN W PCR REFLEX    LACTIC ACID, PLASMA  PROCALCITONIN   ____________________________________________  EKG  ____________________________________________  RADIOLOGY  ED MD interpretation: CT abdomen pelvis shows no evidence of diverticulitis, kidney stone, pyonephritis, biliary pathology or other clear acute abdominal pelvic process.  There is a tiny hiatal hernia and aortic atherosclerosis.  Official radiology report(s): CT ABDOMEN PELVIS W CONTRAST  Result Date: 03/25/2021 CLINICAL DATA:  Nonlocalized acute abdominal pain. diarrhea, vomiting for 4-5 days. Last chemo 16 days ago. Has lung, hip and brain cancer. States getting worse instead of better since chemo. NAD noted. Alert and oriented EXAM: CT ABDOMEN AND PELVIS WITH CONTRAST TECHNIQUE: Multidetector CT imaging of the abdomen and pelvis was performed using the standard protocol following bolus administration of intravenous contrast. CONTRAST:  68mL OMNIPAQUE IOHEXOL 350 MG/ML SOLN COMPARISON:  CT chest 11/19/2020 FINDINGS: Lower chest: Linear atelectasis  versus scarring within the left lower lobe. Tiny hiatal hernia Hepatobiliary: No focal liver abnormality. No gallstones, gallbladder wall thickening, or pericholecystic fluid. No biliary dilatation. Pancreas: No focal lesion. Normal pancreatic contour. No surrounding inflammatory changes. No main pancreatic ductal dilatation. Spleen: Normal in size without focal abnormality. Adrenals/Urinary Tract: No adrenal nodule bilaterally. Bilateral kidneys enhance symmetrically. Subcentimeter hypodensities too small to characterize. No hydronephrosis. No hydroureter. The urinary bladder is unremarkable. On delayed imaging, there is no urothelial wall thickening and there are no filling defects in the opacified portions of the bilateral collecting systems or ureters.  Stomach/Bowel: Oval density within the pyloric lumen likely related to medication ingestion. Stomach is within normal limits. No evidence of bowel wall thickening or dilatation. Appendix appears normal. Vascular/Lymphatic: No abdominal aorta or iliac aneurysm. Severe atherosclerotic plaque of the aorta and its branches. No abdominal, pelvic, or inguinal lymphadenopathy. Reproductive: Status post hysterectomy. No adnexal masses. Other: No intraperitoneal free fluid. No intraperitoneal free gas. No organized fluid collection. Musculoskeletal: No abdominal wall hernia or abnormality. No suspicious lytic or blastic osseous lesions. No acute displaced fracture. IMPRESSION: 1. No acute intra-abdominal or intrapelvic abnormality. 2. Tiny hiatal hernia. 3.  Aortic Atherosclerosis (ICD10-I70.0). Electronically Signed   By: Iven Finn M.D.   On: 03/25/2021 19:01    ____________________________________________   PROCEDURES  Procedure(s) performed (including Critical Care):  Procedures   ____________________________________________   INITIAL IMPRESSION / ASSESSMENT AND PLAN / ED COURSE      Patient presents with above-stated history exam for assessment of several weeks of progressive nausea, decreased appetite, intermittent nonbloody vomiting, and therefore days of progressive increase in nonbloody diarrhea.  Patient also notes she is currently being treated for urinary tract infection but denies any urinary symptoms abdominal pain back pain fever or other clear associated symptoms today.  She does appear quite dehydrated on exam abdomen is soft and she has no significant CVA tenderness.  On arrival she is afebrile hemodynamically stable borderline low blood pressure at 107/96.  Differential includes possible GI symptoms related to chemotherapy causing dehydration, ongoing cystitis, metabolic derangements, diverticulitis, acute infectious gastroenteritis given she is on antibiotics with C. difficile  on the differential.  CBC shows WBC count of 34.1, hemoglobin of 9.9 and platelets of 80.  2 days ago CBC showed WBC count of 24.3 and hemoglobin 8.8 and platelets of 43.  5 degrees the patient's WBCs were 0.8 and hemoglobin was 7.2 with platelets of 21.  Patient did receive Neulasta on 7/21.  COVID and influenza PCR is negative.  Lactic acid is nonelevated.  UA shows rare bacteria with small hemoglobin 6-10 WBCs but no nitrites or leukocyte esterase.  CT abdomen pelvis shows no evidence of diverticulitis, kidney stone, pyonephritis, biliary pathology or other clear acute abdominal pelvic process.  There is a tiny hiatal hernia and aortic atherosclerosis.  Discussed patient's presentation and initial work-up with on-call oncologist Dr. Tasia Catchings who stated patient's leukocytosis today could certainly be related to recent Neulasta not necessarily for.  She did recommend obtaining C. difficile patient is able to provide a stool sample.  It is also possible her GI symptoms are from her chemotherapy.  On reassessment patient states she is feeling better after IV fluids.  She was able to tolerate some Sprite and had a couple crackers.  She feels that she is much better than when she came in and does not wish to be hospitalized.  She does states she has several antiemetics at home and after discussion with  daughter it seems they have been trying different things including Reglan and and Compazine after they are having less success with Zofran.  However patient states she currently does not feel nauseous.  Will initially think patient was little dehydrated and she was hydrated with some IV fluids given she is now tolerating p.o. and drinking some fluids with otherwise stable vitals and low suspicion.  Magnesium did return unexpectedly low at 0.9.  This was aggressively repleted IV with 6 g.  In addition potassium was aggressively repleted.  However given she is tolerating p.o. strongly wishes to go home with otherwise  stable vitals and low suspicion for sepsis I think discharge is reasonable with plan for patient to schedule close follow-up with her oncologist and PCP.  Advised her that she will need to have her electrolytes rechecked in 1 to 2 days.  She is amenable this plan.  She will return immediately to emergency room if she develops fever, is unable to keep fluids down with her antiemetics at home or develops any acute pain or worsening of symptoms.  Care signed over to PA provider at 9pm with plan to continue to observe while getting Mg infusion and d/c once completed.      ____________________________________________   FINAL CLINICAL IMPRESSION(S) / ED DIAGNOSES  Final diagnoses:  Nausea vomiting and diarrhea  Dehydration  Hypokalemia  Hypomagnesemia    Medications  potassium chloride 10 mEq in 100 mL IVPB (has no administration in time range)  magnesium sulfate 6 g in dextrose 5 % 100 mL IVPB (has no administration in time range)  lactated ringers bolus 1,000 mL (0 mLs Intravenous Stopped 03/25/21 1951)  iohexol (OMNIPAQUE) 350 MG/ML injection 75 mL (75 mLs Intravenous Contrast Given 03/25/21 1806)  potassium chloride SA (KLOR-CON) CR tablet 40 mEq (40 mEq Oral Given 03/25/21 2042)     ED Discharge Orders     None        Note:  This document was prepared using Dragon voice recognition software and may include unintentional dictation errors.    Lucrezia Starch, MD 03/25/21 2059

## 2021-03-25 NOTE — Telephone Encounter (Signed)
Patient daughter called reporting that her father called her reporting that patient is having diarrhea, is shaking all over and is not eating. She requests a return call to herself (though she is out of town at this time) so that she can advise her father . She does not want patient to go to ER if it can be avoided

## 2021-03-25 NOTE — ED Provider Notes (Signed)
Emergency Medicine Provider Triage Evaluation Note  Karrine Kluttz , a 75 y.o. female  was evaluated in triage.  Lung cancer patient with bone and brain mets presents to the ER with complain of nausea, vomiting, diarrhea, decreased appetite x 5 days. Last chemo was 16 days ago and is feeling worse instead of better.   Review of Systems  Positive: Nausea, vomiting, diarrhea Negative: Fever, abdominal pain, dysuria.  Physical Exam  BP (!) 107/96   Pulse 86   Temp 98.6 F (37 C) (Oral)   Resp 16   Ht 5\' 7"  (1.702 m)   Wt 63 kg   SpO2 97%   BMI 21.77 kg/m  Gen:   Awake, no distress   Resp:  Normal effort  MSK:   Moves extremities without difficulty  Other:    Medical Decision Making  Medically screening exam initiated at 4:13 PM.  Appropriate orders placed.  Criss Rosales was informed that the remainder of the evaluation will be completed by another provider, this initial triage assessment does not replace that evaluation, and the importance of remaining in the ED until their evaluation is complete.    Victorino Dike, FNP 03/25/21 1621    Lucrezia Starch, MD 03/25/21 [phone]

## 2021-03-25 NOTE — ED Triage Notes (Signed)
Pt to ED for diarrhea, vomitting for 4-5 days. Last chemo 16 days ago. Has lung, hip and brain cancer.  States getting worse instead of better since chemo. NAD noted. Alert and oriented Cari beth in triage to assess pt, orders to be placed

## 2021-03-26 ENCOUNTER — Encounter: Payer: Self-pay | Admitting: Oncology

## 2021-03-26 ENCOUNTER — Inpatient Hospital Stay: Payer: Medicare Other

## 2021-03-26 ENCOUNTER — Other Ambulatory Visit: Payer: Self-pay | Admitting: *Deleted

## 2021-03-26 VITALS — BP 154/68 | HR 85 | Temp 98.6°F | Resp 18

## 2021-03-26 DIAGNOSIS — Z95828 Presence of other vascular implants and grafts: Secondary | ICD-10-CM

## 2021-03-26 DIAGNOSIS — Z5112 Encounter for antineoplastic immunotherapy: Secondary | ICD-10-CM | POA: Diagnosis not present

## 2021-03-26 DIAGNOSIS — C7931 Secondary malignant neoplasm of brain: Secondary | ICD-10-CM

## 2021-03-26 DIAGNOSIS — C3490 Malignant neoplasm of unspecified part of unspecified bronchus or lung: Secondary | ICD-10-CM

## 2021-03-26 DIAGNOSIS — R531 Weakness: Secondary | ICD-10-CM

## 2021-03-26 LAB — MAGNESIUM: Magnesium: 1.8 mg/dL (ref 1.7–2.4)

## 2021-03-26 MED ORDER — HEPARIN SOD (PORK) LOCK FLUSH 100 UNIT/ML IV SOLN
500.0000 [IU] | Freq: Once | INTRAVENOUS | Status: AC
  Administered 2021-03-26: 500 [IU]
  Filled 2021-03-26: qty 5

## 2021-03-26 MED ORDER — SODIUM CHLORIDE 0.9 % IV SOLN
Freq: Once | INTRAVENOUS | Status: AC
  Filled 2021-03-26: qty 250

## 2021-03-26 NOTE — Addendum Note (Signed)
Addended by: Gloris Ham on: 03/26/2021 05:50 PM   Modules accepted: Orders

## 2021-03-27 ENCOUNTER — Other Ambulatory Visit: Payer: Medicare Other

## 2021-03-27 ENCOUNTER — Ambulatory Visit: Payer: Medicare Other

## 2021-03-30 ENCOUNTER — Other Ambulatory Visit: Payer: Self-pay | Admitting: Oncology

## 2021-03-30 ENCOUNTER — Other Ambulatory Visit: Payer: Self-pay

## 2021-03-30 ENCOUNTER — Ambulatory Visit
Admission: RE | Admit: 2021-03-30 | Discharge: 2021-03-30 | Disposition: A | Discharge status: Home / Self Care | Payer: Medicare Other | Source: Ambulatory Visit | Attending: Oncology | Admitting: Oncology

## 2021-03-30 DIAGNOSIS — C7951 Secondary malignant neoplasm of bone: Secondary | ICD-10-CM | POA: Insufficient documentation

## 2021-03-30 DIAGNOSIS — C3490 Malignant neoplasm of unspecified part of unspecified bronchus or lung: Secondary | ICD-10-CM

## 2021-03-30 MED ORDER — IOHEXOL 300 MG/ML  SOLN
75.0000 mL | Freq: Once | INTRAMUSCULAR | Status: AC | PRN
  Administered 2021-03-30: 75 mL via INTRAVENOUS

## 2021-03-31 ENCOUNTER — Encounter: Payer: Self-pay | Admitting: *Deleted

## 2021-03-31 ENCOUNTER — Inpatient Hospital Stay: Payer: Medicare Other

## 2021-03-31 ENCOUNTER — Other Ambulatory Visit: Payer: Self-pay

## 2021-03-31 ENCOUNTER — Inpatient Hospital Stay (HOSPITAL_BASED_OUTPATIENT_CLINIC_OR_DEPARTMENT_OTHER): Payer: Medicare Other | Admitting: Oncology

## 2021-03-31 ENCOUNTER — Encounter: Payer: Self-pay | Admitting: Oncology

## 2021-03-31 DIAGNOSIS — C3490 Malignant neoplasm of unspecified part of unspecified bronchus or lung: Secondary | ICD-10-CM

## 2021-03-31 DIAGNOSIS — E876 Hypokalemia: Secondary | ICD-10-CM

## 2021-03-31 DIAGNOSIS — Z7189 Other specified counseling: Secondary | ICD-10-CM

## 2021-03-31 DIAGNOSIS — E871 Hypo-osmolality and hyponatremia: Secondary | ICD-10-CM

## 2021-03-31 DIAGNOSIS — Z95828 Presence of other vascular implants and grafts: Secondary | ICD-10-CM

## 2021-03-31 DIAGNOSIS — Z5112 Encounter for antineoplastic immunotherapy: Secondary | ICD-10-CM | POA: Diagnosis not present

## 2021-03-31 DIAGNOSIS — C7951 Secondary malignant neoplasm of bone: Secondary | ICD-10-CM

## 2021-03-31 LAB — CBC WITH DIFFERENTIAL/PLATELET
Abs Immature Granulocytes: 1.19 10*3/uL — ABNORMAL HIGH (ref 0.00–0.07)
Basophils Absolute: 0.1 10*3/uL (ref 0.0–0.1)
Basophils Relative: 1 %
Eosinophils Absolute: 0 10*3/uL (ref 0.0–0.5)
Eosinophils Relative: 0 %
HCT: 28.8 % — ABNORMAL LOW (ref 36.0–46.0)
Hemoglobin: 9.9 g/dL — ABNORMAL LOW (ref 12.0–15.0)
Immature Granulocytes: 7 %
Lymphocytes Relative: 6 %
Lymphs Abs: 1.1 10*3/uL (ref 0.7–4.0)
MCH: 31.8 pg (ref 26.0–34.0)
MCHC: 34.4 g/dL (ref 30.0–36.0)
MCV: 92.6 fL (ref 80.0–100.0)
Monocytes Absolute: 1.5 10*3/uL — ABNORMAL HIGH (ref 0.1–1.0)
Monocytes Relative: 9 %
Neutro Abs: 12.7 10*3/uL — ABNORMAL HIGH (ref 1.7–7.7)
Neutrophils Relative %: 77 %
Platelets: 181 10*3/uL (ref 150–400)
RBC: 3.11 MIL/uL — ABNORMAL LOW (ref 3.87–5.11)
RDW: 22.6 % — ABNORMAL HIGH (ref 11.5–15.5)
Smear Review: NORMAL
WBC: 16.6 10*3/uL — ABNORMAL HIGH (ref 4.0–10.5)
nRBC: 0 % (ref 0.0–0.2)

## 2021-03-31 LAB — MAGNESIUM: Magnesium: 1.3 mg/dL — ABNORMAL LOW (ref 1.7–2.4)

## 2021-03-31 LAB — COMPREHENSIVE METABOLIC PANEL
ALT: 10 U/L (ref 0–44)
AST: 17 U/L (ref 15–41)
Albumin: 3.6 g/dL (ref 3.5–5.0)
Alkaline Phosphatase: 71 U/L (ref 38–126)
Anion gap: 9 (ref 5–15)
BUN: 13 mg/dL (ref 8–23)
CO2: 26 mmol/L (ref 22–32)
Calcium: 9 mg/dL (ref 8.9–10.3)
Chloride: 88 mmol/L — ABNORMAL LOW (ref 98–111)
Creatinine, Ser: 0.94 mg/dL (ref 0.44–1.00)
GFR, Estimated: >60 mL/min (ref >60)
Glucose, Bld: 161 mg/dL — ABNORMAL HIGH (ref 70–99)
Potassium: 2.7 mmol/L — CL (ref 3.5–5.1)
Sodium: 123 mmol/L — ABNORMAL LOW (ref 135–145)
Total Bilirubin: 0.5 mg/dL (ref 0.3–1.2)
Total Protein: 6.2 g/dL — ABNORMAL LOW (ref 6.5–8.1)

## 2021-03-31 MED ORDER — POTASSIUM CHLORIDE IN NACL 20-0.9 MEQ/L-% IV SOLN
Freq: Once | INTRAVENOUS | Status: AC
  Filled 2021-03-31: qty 1000

## 2021-03-31 MED ORDER — HEPARIN SOD (PORK) LOCK FLUSH 100 UNIT/ML IV SOLN
500.0000 [IU] | Freq: Once | INTRAVENOUS | Status: AC
  Administered 2021-03-31: 500 [IU] via INTRAVENOUS
  Filled 2021-03-31: qty 5

## 2021-03-31 MED ORDER — SODIUM CHLORIDE 0.9% FLUSH
10.0000 mL | Freq: Once | INTRAVENOUS | Status: AC
  Administered 2021-03-31: 10 mL via INTRAVENOUS
  Filled 2021-03-31: qty 10

## 2021-03-31 MED ORDER — SODIUM CHLORIDE 0.9 % IV SOLN
Freq: Once | INTRAVENOUS | Status: AC
  Filled 2021-03-31: qty 250

## 2021-03-31 MED ORDER — MAGNESIUM SULFATE 2 GM/50ML IV SOLN
2.0000 g | Freq: Once | INTRAVENOUS | Status: AC
  Administered 2021-03-31: 2 g via INTRAVENOUS
  Filled 2021-03-31: qty 50

## 2021-03-31 MED ORDER — HEPARIN SOD (PORK) LOCK FLUSH 100 UNIT/ML IV SOLN
INTRAVENOUS | Status: AC
  Filled 2021-03-31: qty 5

## 2021-03-31 NOTE — Progress Notes (Signed)
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  Patient completed radiation on 5/31.  Patient receiving carbo, etoposide, tecentriq.    Met with patient in infusion.  Patient shaky, weak.  Reports no nausea in 2 days and no diarrhea since yesterday am.  Reports that she has been able to eat ham biscuit, jelly biscuit, crackers, cheerios, peaches.  Drinking boost soothe shakes (strawberry kiwi), 4 per day (1200 calories, 40 g protein).      Medications: reviewed  Labs: Na 123, K 2.7, glucose 161, Mag 1.3  Anthropometrics:   Weight 139 lb  145 lb on 7/5 140 lb on 6/28 139 lb on 5/31 145 lb on 5/12 144 lb on 3/30   NUTRITION DIAGNOSIS: Inadequate oral intake continues   INTERVENTION:  Continue boost soothe shakes QID (1200 calories and 40 g protein) Continue eating bland foods to help with nausea and diarrhea    MONITORING, EVALUATION, GOAL: weight trends, intake   NEXT VISIT: Tuesday, August 23rd during infusion  Beth Mcmillan B. Zenia Resides, [place], [place] Registered Dietitian [phone] (mobile)

## 2021-03-31 NOTE — Progress Notes (Signed)
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  She underwent CT chest with contrast in March 2022 following an abnormal x-ray which showed a 2.6 cm mass in the left lower lobe.  This was followed by a PET CT scan which showed a 3.0 x 2.0 cm mass in the left lower lobe of the lung with an SUV of 9.7.  No evidence of locoregional adenopathy.  No evidence of intra-abdominal disease with patient was noted to have a hypermetabolic focus in the right iliac bone with an SUV of 11.1 which was suspicious for metastatic disease.  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  Cells were positive for TTF-1, CD56 and chromogranin.  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  Patient completed whole brain radiation treatment and completed carbo etoposide Tecentriq 4 cycles on 03/10/2021.  Interval history-patient reports feeling significantly fatigued.  At times she feels tremulous.  She has been spending most of her time  in bed.  She also came to the hospital on 03/25/2021 with symptoms of abdominal pain and diarrhea.  CT abdomen did not show any acute pathology.  Nausea is gradually improving.  ECOG PS- 3 Pain scale- 0   Review of systems- Review of Systems  Constitutional:  Negative for chills, fever, malaise/fatigue and weight loss.  HENT:  Negative for congestion, ear discharge and nosebleeds.   Eyes:  Negative for blurred vision.  Respiratory:  Negative for cough, hemoptysis, sputum production, shortness of breath and wheezing.   Cardiovascular:  Negative for chest pain, palpitations, orthopnea and claudication.  Gastrointestinal:  Negative for abdominal pain, blood in stool, constipation, diarrhea, heartburn, melena, nausea and vomiting.  Genitourinary:  Negative for dysuria, flank pain, frequency, hematuria and urgency.  Musculoskeletal:  Negative for back pain, joint pain and myalgias.  Skin:  Negative for rash.  Neurological:  Negative for dizziness, tingling, focal weakness, seizures, weakness and headaches.  Endo/Heme/Allergies:  Does not bruise/bleed easily.  Psychiatric/Behavioral:  Negative for depression and suicidal ideas. The patient does not have insomnia.      Allergies  Allergen Reactions   Augmentin [Amoxicillin-Pot Clavulanate]     "messes up my blood cells"   Codeine Nausea Only   Levaquin [Levofloxacin]     Delusions    Sulfa Antibiotics Nausea And Vomiting   Vancomycin Other (See Comments)    [place] syndome- happened in the hospital at [facility]   Tape Rash    Some bandaids cause rash sometimes     Past Medical History:  Diagnosis Date   Anxiety    Breathing problem  low breathing function 53%   COPD (chronic obstructive pulmonary disease) (HCC)    EMPHYSEMA   Dysrhythmia    tachycardia   Emphysema of lung (HCC)    Hemorrhoid    History of hiatal hernia    SMALL- 2022   Hypercholesteremia    Hypertension    Hypothyroidism    Larynx polyp    Lung cancer (HCC)     Palpitations    with anxiety   Platelets decreased ([place]) 2020   Seasonal allergies    Tinnitus    Vertigo    no episodes in several years   Wears dentures    partial lower   White coat syndrome with hypertension      Past Surgical History:  Procedure Laterality Date   ABDOMINAL HYSTERECTOMY     partial   COLONOSCOPY WITH PROPOFOL N/A 01/22/2016   Procedure: COLONOSCOPY WITH PROPOFOL;  Surgeon: Lucilla Lame, MD;  Location: [place];  Service: Endoscopy;  Laterality: N/A;   COLONOSCOPY WITH PROPOFOL N/A 04/12/2019   Procedure: COLONOSCOPY WITH BIOPSY;  Surgeon: Lucilla Lame, MD;  Location: [place];  Service: Endoscopy;  Laterality: N/A;  pt would like an early appt   ct guided lung bx     IR IMAGING GUIDED PORT INSERTION  12/18/2020   POLYPECTOMY  01/22/2016   Procedure: POLYPECTOMY;  Surgeon: Lucilla Lame, MD;  Location: [place];  Service: Endoscopy;;   POLYPECTOMY N/A 04/12/2019   Procedure: POLYPECTOMY;  Surgeon: Lucilla Lame, MD;  Location: [place];  Service: Endoscopy;  Laterality: N/A;   VIDEO BRONCHOSCOPY WITH ENDOBRONCHIAL NAVIGATION N/A 11/21/2020   Procedure: VIDEO BRONCHOSCOPY WITH ENDOBRONCHIAL NAVIGATION;  Surgeon: Ottie Glazier, MD;  Location: [facility];  Service: Thoracic;  Laterality: N/A;   VIDEO BRONCHOSCOPY WITH ENDOBRONCHIAL ULTRASOUND N/A 11/21/2020   Procedure: VIDEO BRONCHOSCOPY WITH ENDOBRONCHIAL ULTRASOUND;  Surgeon: Ottie Glazier, MD;  Location: [facility];  Service: Thoracic;  Laterality: N/A;    Social History   Socioeconomic History   Marital status: Married    Spouse name: Not on file   Number of children: Not on file   Years of education: Not on file   Highest education level: Not on file  Occupational History   Not on file  Tobacco Use   Smoking status: Former    Packs/day: 0.50    Years: 50.00    Pack years: 25.00    Types: Cigarettes    Quit date: 11/03/2020    Years since quitting: 0.4   Smokeless  tobacco: Never  Vaping Use   Vaping Use: Some days  Substance and Sexual Activity   Alcohol use: No   Drug use: Never   Sexual activity: Not on file  Other Topics Concern   Not on file  Social History Narrative   Not on file   Social Determinants of Health   Financial Resource Strain: Not on file  Food Insecurity: Not on file  Transportation Needs: Not on file  Physical Activity: Not on file  Stress: Not on file  Social Connections: Not on file  Intimate Partner Violence: Not on file    Family History  Problem Relation Age of Onset   Alzheimer's disease Mother    Heart attack Mother    High Cholesterol Mother    Hypertension Mother    Heart block Mother    Lung cancer Father    Uterine cancer Sister    Hypertension Sister    Heart block Sister    Anxiety  disorder Sister    Breast cancer Neg Hx      Current Outpatient Medications:    acetaminophen (TYLENOL) 500 MG tablet, Take 500 mg by mouth every 6 (six) hours as needed for mild pain or moderate pain., Disp: , Rfl:    atorvastatin (LIPITOR) 10 MG tablet, TAKE 1 TABLET BY MOUTH DAILY, Disp: 90 tablet, Rfl: 0   Fluticasone-Umeclidin-Vilant (TRELEGY ELLIPTA) 100-62.5-25 MCG/INH AEPB, Inhale 1 puff into the lungs daily., Disp: 1 each, Rfl: 2   ketotifen (ZADITOR) 0.025 % ophthalmic solution, Place 1 drop into both eyes 2 (two) times daily as needed., Disp: , Rfl:    levothyroxine (SYNTHROID, LEVOTHROID) 50 MCG tablet, Take 50 mcg by mouth daily before breakfast., Disp: , Rfl:    loperamide (IMODIUM) 2 MG capsule, Take 2 mg by mouth as needed for diarrhea or loose stools., Disp: , Rfl:    loratadine (CLARITIN) 10 MG tablet, Take 10 mg by mouth daily. Reported on 01/22/2016, Disp: , Rfl:    LORazepam (ATIVAN) 0.5 MG tablet, Take 1 tablet (0.5 mg total) by mouth every 6 (six) hours as needed for anxiety., Disp: 120 tablet, Rfl: 0   losartan-hydrochlorothiazide (HYZAAR) 100-25 MG tablet, Take 1 tablet by mouth daily., Disp: ,  Rfl:    magnesium chloride (SLOW-MAG) 64 MG TBEC SR tablet, Take 1 tablet (64 mg total) by mouth daily., Disp: 30 tablet, Rfl: 0   metoCLOPramide (REGLAN) 10 MG tablet, Take 1 tablet (10 mg total) by mouth 4 (four) times daily -  before meals and at bedtime., Disp: 120 tablet, Rfl: 0   metoprolol succinate (TOPROL-XL) 50 MG 24 hr tablet, Take 50 mg by mouth daily., Disp: , Rfl:    ondansetron (ZOFRAN) 8 MG tablet, TAKE 1 TABLET BY MOUTH TWICE DAILY AS NEEDED FOR REFRACTORY NAUSEA/ VOMITING. START ON DAY 3 AFTER CARBOPLATIN CHEMO, Disp: 30 tablet, Rfl: 3   PARoxetine (PAXIL) 10 MG tablet, Take 5 mg by mouth every evening., Disp: , Rfl:    potassium chloride SA (KLOR-CON) 20 MEQ tablet, TAKE 1 TABLET(20 MEQ) BY MOUTH DAILY, Disp: 28 tablet, Rfl: 0   prochlorperazine (COMPAZINE) 10 MG tablet, Take 10 mg by mouth as needed for nausea or vomiting., Disp: , Rfl:    QUEtiapine (SEROQUEL) 25 MG tablet, Take 1 tablet (25 mg total) by mouth at bedtime., Disp: 30 tablet, Rfl: 2   sodium chloride 1 g tablet, Take 1 tablet (1 g total) by mouth 3 (three) times daily., Disp: 42 tablet, Rfl: 0   nystatin (MYCOSTATIN) 100000 UNIT/ML suspension, SHAKE LIQUID AND TAKE 5 ML BY MOUTH FOUR TIMES DAILY (Patient not taking: Reported on 03/31/2021), Disp: 60 mL, Rfl: 0   polyethylene glycol (MIRALAX / GLYCOLAX) 17 g packet, Take 17 g by mouth daily as needed. (Patient not taking: No sig reported), Disp: , Rfl:  No current facility-administered medications for this visit.  Facility-Administered Medications Ordered in Other Visits:    0.9 %  sodium chloride infusion, , Intravenous, Once, Earlie Server, MD  Physical exam:  Vitals:   03/31/21 1011  BP: (!) 129/47  Pulse: (!) 57  Resp: 16  Temp: 98.4 F (36.9 C)  TempSrc: Oral  Weight: 139 lb (63 kg)  Height: 5\' 7"  (1.702 m)   Physical Exam Constitutional:      General: She is not in acute distress.    Comments: She appears frail and fatigued.  Sitting in a wheelchair   Cardiovascular:     Rate and Rhythm: Normal  rate and regular rhythm.     Heart sounds: Normal heart sounds.  Pulmonary:     Effort: Pulmonary effort is normal.     Breath sounds: Normal breath sounds.  Abdominal:     General: Bowel sounds are normal.     Palpations: Abdomen is soft.  Skin:    General: Skin is warm and dry.  Neurological:     Mental Status: She is alert and oriented to person, place, and time.     CMP Latest Ref Rng & Units 03/31/2021  Glucose 70 - 99 mg/dL 161(H)  BUN 8 - 23 mg/dL 13  Creatinine 0.44 - 1.00 mg/dL 0.94  Sodium 135 - 145 mmol/L 123(L)  Potassium 3.5 - 5.1 mmol/L 2.7(LL)  Chloride 98 - 111 mmol/L 88(L)  CO2 22 - 32 mmol/L 26  Calcium 8.9 - 10.3 mg/dL 9.0  Total Protein 6.5 - 8.1 g/dL 6.2(L)  Total Bilirubin 0.3 - 1.2 mg/dL 0.5  Alkaline Phos 38 - 126 U/L 71  AST 15 - 41 U/L 17  ALT 0 - 44 U/L 10   CBC Latest Ref Rng & Units 03/31/2021  WBC 4.0 - 10.5 K/uL 16.6(H)  Hemoglobin 12.0 - 15.0 g/dL 9.9(L)  Hematocrit 36.0 - 46.0 % 28.8(L)  Platelets 150 - 400 K/uL 181    No images are attached to the encounter.  CT CHEST W CONTRAST  Result Date: 03/30/2021 CLINICAL DATA:  Lung cancer, restaging on chemotherapy. EXAM: CT CHEST WITH CONTRAST TECHNIQUE: Multidetector CT imaging of the chest was performed during intravenous contrast administration. CONTRAST:  84mL OMNIPAQUE IOHEXOL 300 MG/ML  SOLN COMPARISON:  CT abdomen pelvis 03/25/2021 and CT chest 11/19/2020. FINDINGS: Cardiovascular: Right IJ Port-A-Cath terminates in the right atrium. Atherosclerotic calcification of the aorta, aortic valve and coronary arteries. Heart is at the upper limits of normal in size. No pericardial effusion. Mediastinum/Nodes: No pathologically enlarged mediastinal, hilar, internal mammary or axillary lymph nodes. Esophagus is grossly unremarkable. Lungs/Pleura: Biapical pleuroparenchymal scarring. Centrilobular and paraseptal emphysema. Scarring in the right middle lobe and  lingula as well as both lower lobes. Left lower lobe nodule measures 1.0 x 1.4 cm (3/106), decreased from 2.1 x 2.6 cm on 11/19/2020. Other millimetric pulmonary nodules are unchanged. No pleural fluid. Airway is unremarkable. Upper Abdomen: Subcentimeter low-attenuation lesion segment 4, too small to characterize. Visualized portions of the liver and gallbladder are otherwise unremarkable. Thickening of the adrenal glands bilaterally. Visualized portion of the right kidney is unremarkable. 8 mm low-attenuation lesion in the upper pole left kidney, too small to characterize. Visualized portions of the spleen, pancreas, stomach and bowel are unremarkable with the exception of a small hiatal hernia. Musculoskeletal: Degenerative changes in the spine. No worrisome lytic or sclerotic lesions. IMPRESSION: 1. Interval decrease in size of left lower lobe nodule. No evidence of metastatic disease. 2. Aortic atherosclerosis (ICD10-I70.0). Coronary artery calcification. 3.  Emphysema (ICD10-J43.9). Electronically Signed   By: Lorin Picket M.D.   On: 03/30/2021 16:08   CT ABDOMEN PELVIS W CONTRAST  Result Date: 03/25/2021 CLINICAL DATA:  Nonlocalized acute abdominal pain. diarrhea, vomiting for 4-5 days. Last chemo 16 days ago. Has lung, hip and brain cancer. States getting worse instead of better since chemo. NAD noted. Alert and oriented EXAM: CT ABDOMEN AND PELVIS WITH CONTRAST TECHNIQUE: Multidetector CT imaging of the abdomen and pelvis was performed using the standard protocol following bolus administration of intravenous contrast. CONTRAST:  78mL OMNIPAQUE IOHEXOL 350 MG/ML SOLN COMPARISON:  CT chest 11/19/2020 FINDINGS: Lower  chest: Linear atelectasis versus scarring within the left lower lobe. Tiny hiatal hernia Hepatobiliary: No focal liver abnormality. No gallstones, gallbladder wall thickening, or pericholecystic fluid. No biliary dilatation. Pancreas: No focal lesion. Normal pancreatic contour. No  surrounding inflammatory changes. No main pancreatic ductal dilatation. Spleen: Normal in size without focal abnormality. Adrenals/Urinary Tract: No adrenal nodule bilaterally. Bilateral kidneys enhance symmetrically. Subcentimeter hypodensities too small to characterize. No hydronephrosis. No hydroureter. The urinary bladder is unremarkable. On delayed imaging, there is no urothelial wall thickening and there are no filling defects in the opacified portions of the bilateral collecting systems or ureters. Stomach/Bowel: Oval density within the pyloric lumen likely related to medication ingestion. Stomach is within normal limits. No evidence of bowel wall thickening or dilatation. Appendix appears normal. Vascular/Lymphatic: No abdominal aorta or iliac aneurysm. Severe atherosclerotic plaque of the aorta and its branches. No abdominal, pelvic, or inguinal lymphadenopathy. Reproductive: Status post hysterectomy. No adnexal masses. Other: No intraperitoneal free fluid. No intraperitoneal free gas. No organized fluid collection. Musculoskeletal: No abdominal wall hernia or abnormality. No suspicious lytic or blastic osseous lesions. No acute displaced fracture. IMPRESSION: 1. No acute intra-abdominal or intrapelvic abnormality. 2. Tiny hiatal hernia. 3.  Aortic Atherosclerosis (ICD10-I70.0). Electronically Signed   By: Iven Finn M.D.   On: 03/25/2021 19:01     Assessment and plan- Patient is a 75 y.o. female with extensive stage small cell lung cancer with bone and brain metastases s/p 4 cycles of carbo etoposide Tecentriq chemotherapy here to discuss CT scan results and further management  I have reviewed CT chest and abdomen images independently and discussed findings with the patient.  Overall there has been decrease in the size of primary Left lower lobe lung mass from prior 2.1 x 2.6 cm to 1 x 1.4 cm.  She did not have any other signs of disease in the lung previously.  She was noted to have a  biopsy-proven hypermetabolic focus in the right ilium on PET scan which is not well visualized on the CT abdomen.  We will await bone scan results to characterize it further.  Overall she has had good response to treatment but has tolerated chemotherapy poorly exacerbated by whole brain radiation as well.  I will give her a break from all treatment for the next 2 weeks and based on her performance status decide about restarting Tecentriq alone at that time.  Plan is to continue Tecentriq until progression or toxicity if tolerated.  Hyponatremia/hypokalemia/hypomagnesemia: She will receive 1 L of IV fluids today along with 20 mEq of IV potassium and IV magnesium.  She will also come later this week for another session of IV fluids and potassium and will be seen by covering NP in 1 week's time for possible IV fluids  Bone metastases: We will restart Zometa in 2 weeks   Visit Diagnosis 1. Hypomagnesemia   2. Small cell lung cancer in adult ([place])   3. Goals of care, counseling/discussion   4. Hyponatremia   5. Hypokalemia      Dr. Randa Evens, MD, MPH [facility] [phone] 03/31/2021 4:19 PM

## 2021-04-03 ENCOUNTER — Encounter: Payer: Self-pay | Admitting: Oncology

## 2021-04-03 ENCOUNTER — Inpatient Hospital Stay: Payer: Medicare Other

## 2021-04-03 ENCOUNTER — Inpatient Hospital Stay (HOSPITAL_BASED_OUTPATIENT_CLINIC_OR_DEPARTMENT_OTHER): Payer: Medicare Other | Admitting: Hospice and Palliative Medicine

## 2021-04-03 ENCOUNTER — Other Ambulatory Visit: Payer: Self-pay

## 2021-04-03 VITALS — BP 145/52 | HR 74 | Temp 99.0°F | Resp 18

## 2021-04-03 DIAGNOSIS — R300 Dysuria: Secondary | ICD-10-CM

## 2021-04-03 DIAGNOSIS — C7951 Secondary malignant neoplasm of bone: Secondary | ICD-10-CM

## 2021-04-03 DIAGNOSIS — N390 Urinary tract infection, site not specified: Secondary | ICD-10-CM | POA: Diagnosis not present

## 2021-04-03 DIAGNOSIS — Z5112 Encounter for antineoplastic immunotherapy: Secondary | ICD-10-CM | POA: Diagnosis not present

## 2021-04-03 DIAGNOSIS — R5383 Other fatigue: Secondary | ICD-10-CM

## 2021-04-03 DIAGNOSIS — C3490 Malignant neoplasm of unspecified part of unspecified bronchus or lung: Secondary | ICD-10-CM

## 2021-04-03 DIAGNOSIS — C7931 Secondary malignant neoplasm of brain: Secondary | ICD-10-CM

## 2021-04-03 DIAGNOSIS — R531 Weakness: Secondary | ICD-10-CM

## 2021-04-03 LAB — URINALYSIS, COMPLETE (UACMP) WITH MICROSCOPIC
Bacteria, UA: NONE SEEN
Bilirubin Urine: NEGATIVE
Glucose, UA: NEGATIVE mg/dL
Ketones, ur: NEGATIVE mg/dL
Nitrite: NEGATIVE
Protein, ur: NEGATIVE mg/dL
Specific Gravity, Urine: 1.01 (ref 1.005–1.030)
WBC, UA: >50 WBC/hpf — ABNORMAL HIGH (ref 0–5)
pH: 6 (ref 5.0–8.0)

## 2021-04-03 LAB — CBC WITH DIFFERENTIAL/PLATELET
Abs Immature Granulocytes: 0.47 10*3/uL — ABNORMAL HIGH (ref 0.00–0.07)
Basophils Absolute: 0.1 10*3/uL (ref 0.0–0.1)
Basophils Relative: 0 %
Eosinophils Absolute: 0 10*3/uL (ref 0.0–0.5)
Eosinophils Relative: 0 %
HCT: 26.8 % — ABNORMAL LOW (ref 36.0–46.0)
Hemoglobin: 9.2 g/dL — ABNORMAL LOW (ref 12.0–15.0)
Immature Granulocytes: 3 %
Lymphocytes Relative: 5 %
Lymphs Abs: 0.8 10*3/uL (ref 0.7–4.0)
MCH: 31.8 pg (ref 26.0–34.0)
MCHC: 34.3 g/dL (ref 30.0–36.0)
MCV: 92.7 fL (ref 80.0–100.0)
Monocytes Absolute: 1.6 10*3/uL — ABNORMAL HIGH (ref 0.1–1.0)
Monocytes Relative: 10 %
Neutro Abs: 13.4 10*3/uL — ABNORMAL HIGH (ref 1.7–7.7)
Neutrophils Relative %: 82 %
Platelets: 165 10*3/uL (ref 150–400)
RBC: 2.89 MIL/uL — ABNORMAL LOW (ref 3.87–5.11)
RDW: 22.4 % — ABNORMAL HIGH (ref 11.5–15.5)
WBC: 16.4 10*3/uL — ABNORMAL HIGH (ref 4.0–10.5)
nRBC: 0 % (ref 0.0–0.2)

## 2021-04-03 LAB — COMPREHENSIVE METABOLIC PANEL
ALT: 10 U/L (ref 0–44)
AST: 15 U/L (ref 15–41)
Albumin: 3.5 g/dL (ref 3.5–5.0)
Alkaline Phosphatase: 62 U/L (ref 38–126)
Anion gap: 12 (ref 5–15)
BUN: 15 mg/dL (ref 8–23)
CO2: 25 mmol/L (ref 22–32)
Calcium: 8.5 mg/dL — ABNORMAL LOW (ref 8.9–10.3)
Chloride: 84 mmol/L — ABNORMAL LOW (ref 98–111)
Creatinine, Ser: 0.72 mg/dL (ref 0.44–1.00)
GFR, Estimated: >60 mL/min (ref >60)
Glucose, Bld: 113 mg/dL — ABNORMAL HIGH (ref 70–99)
Potassium: 3.6 mmol/L (ref 3.5–5.1)
Sodium: 121 mmol/L — ABNORMAL LOW (ref 135–145)
Total Bilirubin: 0.7 mg/dL (ref 0.3–1.2)
Total Protein: 6 g/dL — ABNORMAL LOW (ref 6.5–8.1)

## 2021-04-03 MED ORDER — NITROFURANTOIN MONOHYD MACRO 100 MG PO CAPS: 100.0000 mg | ORAL_CAPSULE | Freq: Two times a day (BID) | ORAL | 0 refills | Status: DC

## 2021-04-03 MED ORDER — HEPARIN SOD (PORK) LOCK FLUSH 100 UNIT/ML IV SOLN
500.0000 [IU] | Freq: Once | INTRAVENOUS | Status: AC
  Administered 2021-04-03: 500 [IU] via INTRAVENOUS
  Filled 2021-04-03: qty 5

## 2021-04-03 MED ORDER — SODIUM CHLORIDE 0.9 % IV SOLN
Freq: Once | INTRAVENOUS | Status: AC
  Filled 2021-04-03: qty 250

## 2021-04-03 MED ORDER — SODIUM CHLORIDE 1 G PO TABS: 1.0000 g | ORAL_TABLET | Freq: Three times a day (TID) | ORAL | 0 refills | Status: DC

## 2021-04-03 NOTE — Progress Notes (Signed)
Symptom Management [place]  [phone] [phone]  Patient Care Team: Idelle Crouch, MD as PCP - General (Internal Medicine) Telford Nab, RN as Oncology Nurse Navigator Sindy Guadeloupe, MD as Attending Physician (Hematology)   Name of the patient: Beth Mcmillan  [id number]  [birth year]   Date of visit: 04/03/21  Reason for Consult:  Ms. 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  Patient was recently hospitalized 02/02/2021-02/05/2021 for suspected sepsis, although a clear source of infection was never identified.  She was again hospitalized 02/07/2021-02/08/2021 with altered mental status thought secondary to delirium from lack of sleep.  This resolved quickly with use of as needed Ativan.  Patient was also treated for right lower extremity cellulitis.    Patient was seen in the ER on 03/25/2021 for abdominal pain and diarrhea.  Abdominal CT did not reveal anything acute.  Patient last saw Dr. Janese Banks on 03/31/2021 at which time she felt significantly fatigued and was occasionally tremulous.  Patient has been spending more time in bed. CT of the chest and abdomen on 03/30/2021 revealed interval decrease in size of left lower lobe nodule without evidence of metastatic disease. Patient was felt to have had a good response to chemotherapy but with overall poor tolerance.  Plan was to give patient a 2-week holiday from chemotherapy before restarting Tecentriq alone.  Patient presents to [facility] today for follow-up on labs and consideration of fluids.  However, patient reports waking early this morning with dysuria, urinary frequency, and urgency and feels like she has a UTI.  She denies fever or chills.  No back or suprapubic pain.  She has chronic nausea but denies vomiting.  Occasional loose stools but  no diarrhea.  She remains fatigued.  Patient denies other symptomatic complaints.  PAST MEDICAL HISTORY: Past Medical History:  Diagnosis Date   Anxiety    Breathing problem    low breathing function 53%   COPD (chronic obstructive pulmonary disease) (HCC)    EMPHYSEMA   Dysrhythmia    tachycardia   Emphysema of lung (HCC)    Hemorrhoid    History of hiatal hernia    SMALL- 2022   Hypercholesteremia    Hypertension    Hypothyroidism    Larynx polyp    Lung cancer (HCC)    Palpitations    with anxiety   Platelets decreased ([place]) 2020   Seasonal allergies    Tinnitus    Vertigo    no episodes in several years   Wears dentures    partial lower   White coat syndrome with hypertension     PAST SURGICAL HISTORY:  Past Surgical History:  Procedure Laterality Date   ABDOMINAL HYSTERECTOMY     partial   COLONOSCOPY WITH PROPOFOL N/A 01/22/2016   Procedure: COLONOSCOPY WITH PROPOFOL;  Surgeon: Lucilla Lame, MD;  Location: [place];  Service: Endoscopy;  Laterality: N/A;   COLONOSCOPY WITH PROPOFOL N/A 04/12/2019   Procedure: COLONOSCOPY WITH BIOPSY;  Surgeon: Lucilla Lame, MD;  Location: [place];  Service: Endoscopy;  Laterality: N/A;  pt would like an early appt   ct guided lung bx     IR IMAGING GUIDED PORT INSERTION  12/18/2020   POLYPECTOMY  01/22/2016   Procedure: POLYPECTOMY;  Surgeon: Lucilla Lame, MD;  Location: [place];  Service: Endoscopy;;  POLYPECTOMY N/A 04/12/2019   Procedure: POLYPECTOMY;  Surgeon: Lucilla Lame, MD;  Location: [place];  Service: Endoscopy;  Laterality: N/A;   VIDEO BRONCHOSCOPY WITH ENDOBRONCHIAL NAVIGATION N/A 11/21/2020   Procedure: VIDEO BRONCHOSCOPY WITH ENDOBRONCHIAL NAVIGATION;  Surgeon: Ottie Glazier, MD;  Location: [facility];  Service: Thoracic;  Laterality: N/A;   VIDEO BRONCHOSCOPY WITH ENDOBRONCHIAL ULTRASOUND N/A 11/21/2020   Procedure: VIDEO BRONCHOSCOPY WITH ENDOBRONCHIAL ULTRASOUND;  Surgeon:  Ottie Glazier, MD;  Location: [facility];  Service: Thoracic;  Laterality: N/A;    HEMATOLOGY/ONCOLOGY HISTORY:  Oncology History  Small cell lung cancer in adult ([place])  12/13/2020 Initial Diagnosis   Small cell lung cancer in adult [facility])   12/22/2020 -  Chemotherapy    Patient is on Treatment Plan: LUNG SCLC CARBOPLATIN + ETOPOSIDE + ATEZOLIZUMAB INDUCTION Q21D / ATEZOLIZUMAB MAINTENANCE Q21D      12/22/2020 Cancer Staging   Staging form: Lung, AJCC 8th Edition - Clinical stage from 12/22/2020: Stage IV (cT2, cN0, cM1) - Signed by Sindy Guadeloupe, MD on 12/22/2020     ALLERGIES:  is allergic to augmentin [amoxicillin-pot clavulanate], codeine, levaquin [levofloxacin], sulfa antibiotics, vancomycin, and tape.  MEDICATIONS:  Current Outpatient Medications  Medication Sig Dispense Refill   acetaminophen (TYLENOL) 500 MG tablet Take 500 mg by mouth every 6 (six) hours as needed for mild pain or moderate pain.     atorvastatin (LIPITOR) 10 MG tablet TAKE 1 TABLET BY MOUTH DAILY 90 tablet 0   Fluticasone-Umeclidin-Vilant (TRELEGY ELLIPTA) 100-62.5-25 MCG/INH AEPB Inhale 1 puff into the lungs daily. 1 each 2   ketotifen (ZADITOR) 0.025 % ophthalmic solution Place 1 drop into both eyes 2 (two) times daily as needed.     levothyroxine (SYNTHROID, LEVOTHROID) 50 MCG tablet Take 50 mcg by mouth daily before breakfast.     loperamide (IMODIUM) 2 MG capsule Take 2 mg by mouth as needed for diarrhea or loose stools.     loratadine (CLARITIN) 10 MG tablet Take 10 mg by mouth daily. Reported on 01/22/2016     LORazepam (ATIVAN) 0.5 MG tablet Take 1 tablet (0.5 mg total) by mouth every 6 (six) hours as needed for anxiety. 120 tablet 0   losartan-hydrochlorothiazide (HYZAAR) 100-25 MG tablet Take 1 tablet by mouth daily.     magnesium chloride (SLOW-MAG) 64 MG TBEC SR tablet Take 1 tablet (64 mg total) by mouth daily. 30 tablet 0   metoCLOPramide (REGLAN) 10 MG tablet Take 1 tablet (10 mg total) by mouth 4  (four) times daily -  before meals and at bedtime. 120 tablet 0   metoprolol succinate (TOPROL-XL) 50 MG 24 hr tablet Take 50 mg by mouth daily.     nystatin (MYCOSTATIN) 100000 UNIT/ML suspension SHAKE LIQUID AND TAKE 5 ML BY MOUTH FOUR TIMES DAILY (Patient not taking: Reported on 03/31/2021) 60 mL 0   ondansetron (ZOFRAN) 8 MG tablet TAKE 1 TABLET BY MOUTH TWICE DAILY AS NEEDED FOR REFRACTORY NAUSEA/ VOMITING. START ON DAY 3 AFTER CARBOPLATIN CHEMO 30 tablet 3   PARoxetine (PAXIL) 10 MG tablet Take 5 mg by mouth every evening.     polyethylene glycol (MIRALAX / GLYCOLAX) 17 g packet Take 17 g by mouth daily as needed. (Patient not taking: No sig reported)     potassium chloride SA (KLOR-CON) 20 MEQ tablet TAKE 1 TABLET(20 MEQ) BY MOUTH DAILY 28 tablet 0   prochlorperazine (COMPAZINE) 10 MG tablet Take 10 mg by mouth as needed for nausea or vomiting.  QUEtiapine (SEROQUEL) 25 MG tablet Take 1 tablet (25 mg total) by mouth at bedtime. 30 tablet 2   sodium chloride 1 g tablet Take 1 tablet (1 g total) by mouth 3 (three) times daily. 42 tablet 0   No current facility-administered medications for this visit.   Facility-Administered Medications Ordered in Other Visits  Medication Dose Route Frequency Provider Last Rate Last Admin   0.9 %  sodium chloride infusion   Intravenous Once Earlie Server, MD        VITAL SIGNS: BP (!) 145/52   Pulse 74   Temp 99 F (37.2 C) (Oral)   Resp 18   SpO2 100%  There were no vitals filed for this visit.   Estimated body mass index is 21.77 kg/m as calculated from the following:   Height as of 03/31/21: 5\' 7"  (1.702 m).   Weight as of 03/31/21: 139 lb (63 kg).  LABS: CBC:    Component Value Date/Time   WBC 16.4 (H) 04/03/2021 0951   HGB 9.2 (L) 04/03/2021 0951   HGB 13.8 06/05/2013 1127   HCT 26.8 (L) 04/03/2021 0951   HCT 40.0 06/05/2013 1127   PLT 165 04/03/2021 0951   PLT 303 06/05/2013 1127   MCV 92.7 04/03/2021 0951   MCV 92 06/05/2013 1127    NEUTROABS 13.4 (H) 04/03/2021 0951   LYMPHSABS 0.8 04/03/2021 0951   MONOABS 1.6 (H) 04/03/2021 0951   EOSABS 0.0 04/03/2021 0951   BASOSABS 0.1 04/03/2021 0951   Comprehensive Metabolic Panel:    Component Value Date/Time   NA 123 (L) 03/31/2021 0930   NA 131 (L) 06/05/2013 1127   K 2.7 (LL) 03/31/2021 0930   K 3.0 (L) 06/05/2013 1127   CL 88 (L) 03/31/2021 0930   CL 96 (L) 06/05/2013 1127   CO2 26 03/31/2021 0930   CO2 26 06/05/2013 1127   BUN 13 03/31/2021 0930   BUN 10 06/05/2013 1127   CREATININE 0.94 03/31/2021 0930   CREATININE 0.94 06/05/2013 1127   GLUCOSE 161 (H) 03/31/2021 0930   GLUCOSE 127 (H) 06/05/2013 1127   CALCIUM 9.0 03/31/2021 0930   CALCIUM 9.1 06/05/2013 1127   AST 17 03/31/2021 0930   ALT 10 03/31/2021 0930   ALKPHOS 71 03/31/2021 0930   BILITOT 0.5 03/31/2021 0930   PROT 6.2 (L) 03/31/2021 0930   ALBUMIN 3.6 03/31/2021 0930    RADIOGRAPHIC STUDIES: CT CHEST W CONTRAST  Result Date: 03/30/2021 CLINICAL DATA:  Lung cancer, restaging on chemotherapy. EXAM: CT CHEST WITH CONTRAST TECHNIQUE: Multidetector CT imaging of the chest was performed during intravenous contrast administration. CONTRAST:  76mL OMNIPAQUE IOHEXOL 300 MG/ML  SOLN COMPARISON:  CT abdomen pelvis 03/25/2021 and CT chest 11/19/2020. FINDINGS: Cardiovascular: Right IJ Port-A-Cath terminates in the right atrium. Atherosclerotic calcification of the aorta, aortic valve and coronary arteries. Heart is at the upper limits of normal in size. No pericardial effusion. Mediastinum/Nodes: No pathologically enlarged mediastinal, hilar, internal mammary or axillary lymph nodes. Esophagus is grossly unremarkable. Lungs/Pleura: Biapical pleuroparenchymal scarring. Centrilobular and paraseptal emphysema. Scarring in the right middle lobe and lingula as well as both lower lobes. Left lower lobe nodule measures 1.0 x 1.4 cm (3/106), decreased from 2.1 x 2.6 cm on 11/19/2020. Other millimetric pulmonary nodules  are unchanged. No pleural fluid. Airway is unremarkable. Upper Abdomen: Subcentimeter low-attenuation lesion segment 4, too small to characterize. Visualized portions of the liver and gallbladder are otherwise unremarkable. Thickening of the adrenal glands bilaterally. Visualized portion of the right  kidney is unremarkable. 8 mm low-attenuation lesion in the upper pole left kidney, too small to characterize. Visualized portions of the spleen, pancreas, stomach and bowel are unremarkable with the exception of a small hiatal hernia. Musculoskeletal: Degenerative changes in the spine. No worrisome lytic or sclerotic lesions. IMPRESSION: 1. Interval decrease in size of left lower lobe nodule. No evidence of metastatic disease. 2. Aortic atherosclerosis (ICD10-I70.0). Coronary artery calcification. 3.  Emphysema (ICD10-J43.9). Electronically Signed   By: Lorin Picket M.D.   On: 03/30/2021 16:08   CT ABDOMEN PELVIS W CONTRAST  Result Date: 03/25/2021 CLINICAL DATA:  Nonlocalized acute abdominal pain. diarrhea, vomiting for 4-5 days. Last chemo 16 days ago. Has lung, hip and brain cancer. States getting worse instead of better since chemo. NAD noted. Alert and oriented EXAM: CT ABDOMEN AND PELVIS WITH CONTRAST TECHNIQUE: Multidetector CT imaging of the abdomen and pelvis was performed using the standard protocol following bolus administration of intravenous contrast. CONTRAST:  50mL OMNIPAQUE IOHEXOL 350 MG/ML SOLN COMPARISON:  CT chest 11/19/2020 FINDINGS: Lower chest: Linear atelectasis versus scarring within the left lower lobe. Tiny hiatal hernia Hepatobiliary: No focal liver abnormality. No gallstones, gallbladder wall thickening, or pericholecystic fluid. No biliary dilatation. Pancreas: No focal lesion. Normal pancreatic contour. No surrounding inflammatory changes. No main pancreatic ductal dilatation. Spleen: Normal in size without focal abnormality. Adrenals/Urinary Tract: No adrenal nodule bilaterally.  Bilateral kidneys enhance symmetrically. Subcentimeter hypodensities too small to characterize. No hydronephrosis. No hydroureter. The urinary bladder is unremarkable. On delayed imaging, there is no urothelial wall thickening and there are no filling defects in the opacified portions of the bilateral collecting systems or ureters. Stomach/Bowel: Oval density within the pyloric lumen likely related to medication ingestion. Stomach is within normal limits. No evidence of bowel wall thickening or dilatation. Appendix appears normal. Vascular/Lymphatic: No abdominal aorta or iliac aneurysm. Severe atherosclerotic plaque of the aorta and its branches. No abdominal, pelvic, or inguinal lymphadenopathy. Reproductive: Status post hysterectomy. No adnexal masses. Other: No intraperitoneal free fluid. No intraperitoneal free gas. No organized fluid collection. Musculoskeletal: No abdominal wall hernia or abnormality. No suspicious lytic or blastic osseous lesions. No acute displaced fracture. IMPRESSION: 1. No acute intra-abdominal or intrapelvic abnormality. 2. Tiny hiatal hernia. 3.  Aortic Atherosclerosis (ICD10-I70.0). Electronically Signed   By: Iven Finn M.D.   On: 03/25/2021 19:01    PERFORMANCE STATUS (ECOG) : 2 - Symptomatic, <50% confined to bed  Review of Systems Unless otherwise noted, a complete review of systems is negative.  Physical Exam General: NAD Cardiovascular: regular rate and rhythm Pulmonary: clear ant/posterior fields Abdomen: soft, nontender, + bowel sounds GU: no suprapubic tenderness Extremities: No lower extremity edema, no joint deformities Skin: Small scab to RLE Neurological: Weakness but otherwise nonfocal   Assessment and Plan- Patient is a 75 y.o. 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  Patient is on a chemo holiday for the next 2 weeks.  We will give IV fluids today.  Continue supportive care.  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  Patient was previously on salt tablets. Discussed with Dr. Janese Banks and will restart.   UTI - UA appears consistent with UTI.  Will send for culture and sensitivities.  Patient is nontoxic-appearing.  She does have multiple antibiotic allergies but has  tolerated Macrobid in the past.  We will restart Macrobid 100 mg twice daily x5 days. GFR >60.  Goals of care -patient has had recent interval improvement with her cancer on chemotherapy.  However, she recognizes that the chemotherapy has caused a symptom burden with some reduction in her quality of life.  However, patient remains committed to pursuing treatment.  We did discuss completing ACP documents and a MOST form, which I previously sent home with patient.  Patient reports that it was her understanding from working previously with an attorney that if she has a living will it would mean foregoing any medical treatment.  I explained that that was not the case.  Patient says that she will think about it.  Case and plan discussed with Dr. Janese Banks. Patient to follow-up with Beckey Rutter, NP as previously scheduled on 04/08/2019 for repeat labs and possible fluids  Patient expressed understanding and was in agreement with this plan. She also understands that She can call clinic at any time with any questions, concerns, or complaints.   Thank you for allowing me to participate in the care of this very pleasant patient.   Time Total: 30 minutes  Visit consisted of counseling and education dealing with the complex and emotionally intense issues of symptom management and palliative care in the setting of serious and potentially life-threatening illness.Greater than 50%  of this time was spent counseling and coordinating  care related to the above assessment and plan.  Signed by: Altha Harm, PhD, NP-C

## 2021-04-03 NOTE — Progress Notes (Signed)
Obtained urine specimen. Patient received IV hydration. Will call pt back if urine is positive for infection. Discharged to home. Accompanied by husband

## 2021-04-03 NOTE — Progress Notes (Signed)
Pt is here for IV fluids and possible electrolyte replacement, labs pending. States that she feels like she has a UTI due to painful urination. Denies nausea, vomiting, diarrhea, but does endorse a poor appetite.

## 2021-04-05 ENCOUNTER — Encounter: Payer: Self-pay | Admitting: Oncology

## 2021-04-05 ENCOUNTER — Other Ambulatory Visit: Payer: Self-pay | Admitting: Oncology

## 2021-04-05 LAB — URINE CULTURE: Culture: 40000 — AB

## 2021-04-06 ENCOUNTER — Encounter
Admission: RE | Admit: 2021-04-06 | Discharge: 2021-04-06 | Disposition: A | Discharge status: Home / Self Care | Payer: Medicare Other | Source: Ambulatory Visit | Attending: Oncology | Admitting: Oncology

## 2021-04-06 ENCOUNTER — Encounter: Payer: Self-pay | Admitting: Oncology

## 2021-04-06 ENCOUNTER — Other Ambulatory Visit: Payer: Self-pay

## 2021-04-06 DIAGNOSIS — C3490 Malignant neoplasm of unspecified part of unspecified bronchus or lung: Secondary | ICD-10-CM | POA: Diagnosis present

## 2021-04-06 DIAGNOSIS — C7951 Secondary malignant neoplasm of bone: Secondary | ICD-10-CM | POA: Diagnosis present

## 2021-04-06 MED ORDER — TECHNETIUM TC 99M MEDRONATE IV KIT
20.0000 | PACK | Freq: Once | INTRAVENOUS | Status: AC | PRN
  Administered 2021-04-06: 22.5 via INTRAVENOUS

## 2021-04-07 ENCOUNTER — Other Ambulatory Visit: Payer: Self-pay

## 2021-04-07 ENCOUNTER — Inpatient Hospital Stay (HOSPITAL_BASED_OUTPATIENT_CLINIC_OR_DEPARTMENT_OTHER): Payer: Medicare Other | Admitting: Nurse Practitioner

## 2021-04-07 ENCOUNTER — Inpatient Hospital Stay: Payer: Medicare Other

## 2021-04-07 ENCOUNTER — Encounter: Payer: Self-pay | Admitting: Nurse Practitioner

## 2021-04-07 VITALS — BP 137/51 | HR 77 | Temp 98.4°F | Resp 18 | Wt 141.0 lb

## 2021-04-07 DIAGNOSIS — E871 Hypo-osmolality and hyponatremia: Secondary | ICD-10-CM

## 2021-04-07 DIAGNOSIS — C7931 Secondary malignant neoplasm of brain: Secondary | ICD-10-CM

## 2021-04-07 DIAGNOSIS — C3490 Malignant neoplasm of unspecified part of unspecified bronchus or lung: Secondary | ICD-10-CM

## 2021-04-07 DIAGNOSIS — C7951 Secondary malignant neoplasm of bone: Secondary | ICD-10-CM

## 2021-04-07 DIAGNOSIS — Z5189 Encounter for other specified aftercare: Secondary | ICD-10-CM | POA: Diagnosis not present

## 2021-04-07 DIAGNOSIS — R531 Weakness: Secondary | ICD-10-CM

## 2021-04-07 DIAGNOSIS — Z5112 Encounter for antineoplastic immunotherapy: Secondary | ICD-10-CM | POA: Diagnosis not present

## 2021-04-07 LAB — BASIC METABOLIC PANEL
Anion gap: 9 (ref 5–15)
BUN: 12 mg/dL (ref 8–23)
CO2: 24 mmol/L (ref 22–32)
Calcium: 8.5 mg/dL — ABNORMAL LOW (ref 8.9–10.3)
Chloride: 94 mmol/L — ABNORMAL LOW (ref 98–111)
Creatinine, Ser: 0.78 mg/dL (ref 0.44–1.00)
GFR, Estimated: >60 mL/min (ref >60)
Glucose, Bld: 137 mg/dL — ABNORMAL HIGH (ref 70–99)
Potassium: 3.9 mmol/L (ref 3.5–5.1)
Sodium: 127 mmol/L — ABNORMAL LOW (ref 135–145)

## 2021-04-07 LAB — CBC WITH DIFFERENTIAL/PLATELET
Abs Immature Granulocytes: 0.1 10*3/uL — ABNORMAL HIGH (ref 0.00–0.07)
Basophils Absolute: 0 10*3/uL (ref 0.0–0.1)
Basophils Relative: 1 %
Eosinophils Absolute: 0.1 10*3/uL (ref 0.0–0.5)
Eosinophils Relative: 1 %
HCT: 25.8 % — ABNORMAL LOW (ref 36.0–46.0)
Hemoglobin: 8.7 g/dL — ABNORMAL LOW (ref 12.0–15.0)
Immature Granulocytes: 2 %
Lymphocytes Relative: 5 %
Lymphs Abs: 0.3 10*3/uL — ABNORMAL LOW (ref 0.7–4.0)
MCH: 32.1 pg (ref 26.0–34.0)
MCHC: 33.7 g/dL (ref 30.0–36.0)
MCV: 95.2 fL (ref 80.0–100.0)
Monocytes Absolute: 0.8 10*3/uL (ref 0.1–1.0)
Monocytes Relative: 12 %
Neutro Abs: 5.2 10*3/uL (ref 1.7–7.7)
Neutrophils Relative %: 79 %
Platelets: 119 10*3/uL — ABNORMAL LOW (ref 150–400)
RBC: 2.71 MIL/uL — ABNORMAL LOW (ref 3.87–5.11)
RDW: 22.5 % — ABNORMAL HIGH (ref 11.5–15.5)
WBC: 6.5 10*3/uL (ref 4.0–10.5)
nRBC: 0 % (ref 0.0–0.2)

## 2021-04-07 LAB — MAGNESIUM: Magnesium: 1.3 mg/dL — ABNORMAL LOW (ref 1.7–2.4)

## 2021-04-07 MED ORDER — HEPARIN SOD (PORK) LOCK FLUSH 100 UNIT/ML IV SOLN
500.0000 [IU] | Freq: Once | INTRAVENOUS | Status: AC | PRN
  Administered 2021-04-07: 500 [IU]
  Filled 2021-04-07: qty 5

## 2021-04-07 MED ORDER — LOSARTAN POTASSIUM 100 MG PO TABS: 100.0000 mg | ORAL_TABLET | Freq: Every day | ORAL | 0 refills | Status: DC

## 2021-04-07 MED ORDER — DEXAMETHASONE SODIUM PHOSPHATE 10 MG/ML IJ SOLN
10.0000 mg | Freq: Once | INTRAMUSCULAR | Status: AC
Start: 2021-04-07 — End: 2021-04-07
  Administered 2021-04-07: 10 mg via INTRAVENOUS
  Filled 2021-04-07: qty 1

## 2021-04-07 MED ORDER — SODIUM CHLORIDE 0.9 % IV SOLN
Freq: Once | INTRAVENOUS | Status: AC
  Filled 2021-04-07: qty 250

## 2021-04-07 MED ORDER — MAGNESIUM SULFATE 4 GM/100ML IV SOLN
4.0000 g | Freq: Once | INTRAVENOUS | Status: AC
  Administered 2021-04-07: 4 g via INTRAVENOUS
  Filled 2021-04-07: qty 100

## 2021-04-07 MED ORDER — SODIUM CHLORIDE 0.9% FLUSH
10.0000 mL | Freq: Once | INTRAVENOUS | Status: AC | PRN
  Administered 2021-04-07: 10 mL
  Filled 2021-04-07: qty 10

## 2021-04-07 NOTE — Progress Notes (Signed)
Pt received IV hydration along with 4 grams magnesium and dexamethasone. Pt drank ice water. Did not want anything that we had to offer to eat. She stated she would eat after she leaves here. Denies any nausea. States she is ready to be discharged to home.VSS.

## 2021-04-07 NOTE — Progress Notes (Signed)
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  She underwent CT chest with contrast in March 2022 following an abnormal x-ray which showed a 2.6 cm mass in the left lower lobe.  This was followed by a PET CT scan which showed a 3.0 x 2.0 cm mass in the left lower lobe of the lung with an SUV of 9.7.  No evidence of locoregional adenopathy.  No evidence of intra-abdominal disease with patient was noted to have a hypermetabolic focus in the right iliac bone with an SUV of 11.1 which was suspicious for metastatic disease.  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  Cells were positive for TTF-1, CD56 and chromogranin.  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  Patient completed whole brain radiation treatment and completed carbo etoposide Tecentriq 4 cycles on 03/10/2021.  Interval history-patient returns to symptom management clinic for follow-up and management of ongoing weakness, and  consideration of IV fluids..  Chronic nausea.  Symptoms are no better or worse with rest.  No better or worse throughout treatment.  Just generally feels tired.  She has completed treatment for UTI and urinary symptoms have resolved.  She continues to drink Ensure daily.  Has poor appetite.  Vomited after eating a hamburger and his stomach has continued to be upset.  She has completed antibiotics for UTI. Her urinary symptoms have resolved.   ECOG PS- 3 Pain scale- 0  Review of systems- Review of Systems  Constitutional:  Positive for malaise/fatigue. Negative for chills, fever and weight loss.  HENT:  Negative for congestion, ear discharge and nosebleeds.   Eyes:  Negative for blurred vision.  Respiratory:  Negative for cough, hemoptysis, sputum production, shortness of breath and wheezing.   Cardiovascular:  Negative for chest pain, palpitations, orthopnea and claudication.  Gastrointestinal:  Positive for nausea. Negative for abdominal pain, blood in stool, constipation, diarrhea, heartburn, melena and vomiting.  Genitourinary:  Negative for dysuria, flank pain, frequency, hematuria and urgency.  Musculoskeletal:  Negative for back pain, joint pain and myalgias.  Skin:  Negative for rash.  Neurological:  Positive for weakness. Negative for dizziness, tingling, focal weakness, seizures and headaches.  Endo/Heme/Allergies:  Does not bruise/bleed easily.  Psychiatric/Behavioral:  Negative for depression and suicidal ideas. The patient does not have insomnia.      Allergies  Allergen Reactions   Augmentin [Amoxicillin-Pot Clavulanate]     "messes up my blood cells"   Codeine Nausea Only   Levaquin [Levofloxacin]     Delusions    Sulfa Antibiotics Nausea And Vomiting   Vancomycin Other (See Comments)  Redman syndome- happened in the hospital at [facility]   Tape Rash    Some bandaids cause rash sometimes    Past Medical History:  Diagnosis Date   Anxiety    Breathing problem    low  breathing function 53%   COPD (chronic obstructive pulmonary disease) (HCC)    EMPHYSEMA   Dysrhythmia    tachycardia   Emphysema of lung (HCC)    Hemorrhoid    History of hiatal hernia    SMALL- 2022   Hypercholesteremia    Hypertension    Hypothyroidism    Larynx polyp    Lung cancer (HCC)    Palpitations    with anxiety   Platelets decreased ([place]) 2020   Seasonal allergies    Tinnitus    Vertigo    no episodes in several years   Wears dentures    partial lower   White coat syndrome with hypertension     Past Surgical History:  Procedure Laterality Date   ABDOMINAL HYSTERECTOMY     partial   COLONOSCOPY WITH PROPOFOL N/A 01/22/2016   Procedure: COLONOSCOPY WITH PROPOFOL;  Surgeon: Lucilla Lame, MD;  Location: [place];  Service: Endoscopy;  Laterality: N/A;   COLONOSCOPY WITH PROPOFOL N/A 04/12/2019   Procedure: COLONOSCOPY WITH BIOPSY;  Surgeon: Lucilla Lame, MD;  Location: [place];  Service: Endoscopy;  Laterality: N/A;  pt would like an early appt   ct guided lung bx     IR IMAGING GUIDED PORT INSERTION  12/18/2020   POLYPECTOMY  01/22/2016   Procedure: POLYPECTOMY;  Surgeon: Lucilla Lame, MD;  Location: [place];  Service: Endoscopy;;   POLYPECTOMY N/A 04/12/2019   Procedure: POLYPECTOMY;  Surgeon: Lucilla Lame, MD;  Location: [place];  Service: Endoscopy;  Laterality: N/A;   VIDEO BRONCHOSCOPY WITH ENDOBRONCHIAL NAVIGATION N/A 11/21/2020   Procedure: VIDEO BRONCHOSCOPY WITH ENDOBRONCHIAL NAVIGATION;  Surgeon: Ottie Glazier, MD;  Location: [facility];  Service: Thoracic;  Laterality: N/A;   VIDEO BRONCHOSCOPY WITH ENDOBRONCHIAL ULTRASOUND N/A 11/21/2020   Procedure: VIDEO BRONCHOSCOPY WITH ENDOBRONCHIAL ULTRASOUND;  Surgeon: Ottie Glazier, MD;  Location: [facility];  Service: Thoracic;  Laterality: N/A;    Social History   Socioeconomic History   Marital status: Married    Spouse name: Not on file   Number of children: Not on  file   Years of education: Not on file   Highest education level: Not on file  Occupational History   Not on file  Tobacco Use   Smoking status: Former    Packs/day: 0.50    Years: 50.00    Pack years: 25.00    Types: Cigarettes    Quit date: 11/03/2020    Years since quitting: 0.4   Smokeless tobacco: Never  Vaping Use   Vaping Use: Some days  Substance and Sexual Activity   Alcohol use: No   Drug use: Never   Sexual activity: Not on file  Other Topics Concern   Not on file  Social History Narrative   Not on file   Social Determinants of Health   Financial Resource Strain: Not on file  Food Insecurity: Not on file  Transportation Needs: Not on file  Physical Activity: Not on file  Stress: Not on file  Social Connections: Not on file  Intimate Partner Violence: Not on file    Family History  Problem Relation Age of Onset   Alzheimer's disease Mother    Heart attack Mother    High Cholesterol  Mother    Hypertension Mother    Heart block Mother    Lung cancer Father    Uterine cancer Sister    Hypertension Sister    Heart block Sister    Anxiety disorder Sister    Breast cancer Neg Hx      Current Outpatient Medications:    acetaminophen (TYLENOL) 500 MG tablet, Take 500 mg by mouth every 6 (six) hours as needed for mild pain or moderate pain., Disp: , Rfl:    atorvastatin (LIPITOR) 10 MG tablet, TAKE 1 TABLET BY MOUTH DAILY, Disp: 90 tablet, Rfl: 0   Fluticasone-Umeclidin-Vilant (TRELEGY ELLIPTA) 100-62.5-25 MCG/INH AEPB, Inhale 1 puff into the lungs daily., Disp: 1 each, Rfl: 2   ketotifen (ZADITOR) 0.025 % ophthalmic solution, Place 1 drop into both eyes 2 (two) times daily as needed., Disp: , Rfl:    levothyroxine (SYNTHROID, LEVOTHROID) 50 MCG tablet, Take 50 mcg by mouth daily before breakfast., Disp: , Rfl:    loperamide (IMODIUM) 2 MG capsule, Take 2 mg by mouth as needed for diarrhea or loose stools., Disp: , Rfl:    loratadine (CLARITIN) 10 MG tablet,  Take 10 mg by mouth daily. Reported on 01/22/2016, Disp: , Rfl:    LORazepam (ATIVAN) 0.5 MG tablet, Take 1 tablet (0.5 mg total) by mouth every 6 (six) hours as needed for anxiety., Disp: 120 tablet, Rfl: 0   losartan-hydrochlorothiazide (HYZAAR) 100-25 MG tablet, Take 1 tablet by mouth daily., Disp: , Rfl:    magnesium chloride (SLOW-MAG) 64 MG TBEC SR tablet, Take 1 tablet (64 mg total) by mouth daily., Disp: 30 tablet, Rfl: 0   metoprolol succinate (TOPROL-XL) 50 MG 24 hr tablet, Take 50 mg by mouth daily., Disp: , Rfl:    nitrofurantoin, macrocrystal-monohydrate, (MACROBID) 100 MG capsule, Take 1 capsule (100 mg total) by mouth 2 (two) times daily., Disp: 10 capsule, Rfl: 0   ondansetron (ZOFRAN) 8 MG tablet, TAKE 1 TABLET BY MOUTH TWICE DAILY AS NEEDED FOR REFRACTORY NAUSEA/ VOMITING. START ON DAY 3 AFTER CARBOPLATIN CHEMO, Disp: 30 tablet, Rfl: 3   Phenazopyridine HCl (AZO URINARY PAIN PO), Take by mouth., Disp: , Rfl:    potassium chloride SA (KLOR-CON) 20 MEQ tablet, TAKE 1 TABLET(20 MEQ) BY MOUTH DAILY, Disp: 28 tablet, Rfl: 0   prochlorperazine (COMPAZINE) 10 MG tablet, Take 10 mg by mouth as needed for nausea or vomiting., Disp: , Rfl:    QUEtiapine (SEROQUEL) 25 MG tablet, Take 1 tablet (25 mg total) by mouth at bedtime., Disp: 30 tablet, Rfl: 2   sodium chloride 1 g tablet, Take 1 tablet (1 g total) by mouth 3 (three) times daily., Disp: 42 tablet, Rfl: 0   lidocaine (XYLOCAINE) 2 % solution, SMARTSIG:By Mouth, Disp: , Rfl:  No current facility-administered medications for this visit.  Facility-Administered Medications Ordered in Other Visits:    0.9 %  sodium chloride infusion, , Intravenous, Once, Earlie Server, MD   0.9 %  sodium chloride infusion, , Intravenous, Once, Sindy Guadeloupe, MD, Last Rate: 999 mL/hr at 04/07/21 0929, New Bag at 04/07/21 0929   heparin lock flush 100 unit/mL, 500 Units, Intracatheter, Once PRN, Sindy Guadeloupe, MD  Physical exam:  Vitals:   04/07/21 0930  BP:  (!) 137/51  Pulse: 77  Resp: 18  Temp: 98.4 F (36.9 C)  TempSrc: Tympanic  SpO2: 100%  Weight: 141 lb (64 kg)   Physical Exam Constitutional:      General: She is not in acute  distress.    Comments: She appears frail and fatigued.  Sitting in a wheelchair  Cardiovascular:     Rate and Rhythm: Normal rate and regular rhythm.     Heart sounds: Normal heart sounds.  Pulmonary:     Effort: Pulmonary effort is normal.     Breath sounds: Normal breath sounds.  Abdominal:     General: Bowel sounds are normal.     Palpations: Abdomen is soft.  Skin:    General: Skin is warm and dry.  Neurological:     Mental Status: She is alert and oriented to person, place, and time.     CMP Latest Ref Rng & Units 04/03/2021  Glucose 70 - 99 mg/dL 113(H)  BUN 8 - 23 mg/dL 15  Creatinine 0.44 - 1.00 mg/dL 0.72  Sodium 135 - 145 mmol/L 121(L)  Potassium 3.5 - 5.1 mmol/L 3.6  Chloride 98 - 111 mmol/L 84(L)  CO2 22 - 32 mmol/L 25  Calcium 8.9 - 10.3 mg/dL 8.5(L)  Total Protein 6.5 - 8.1 g/dL 6.0(L)  Total Bilirubin 0.3 - 1.2 mg/dL 0.7  Alkaline Phos 38 - 126 U/L 62  AST 15 - 41 U/L 15  ALT 0 - 44 U/L 10   CBC Latest Ref Rng & Units 04/03/2021  WBC 4.0 - 10.5 K/uL 16.4(H)  Hemoglobin 12.0 - 15.0 g/dL 9.2(L)  Hematocrit 36.0 - 46.0 % 26.8(L)  Platelets 150 - 400 K/uL 165    No images are attached to the encounter.  CT CHEST W CONTRAST  Result Date: 03/30/2021 CLINICAL DATA:  Lung cancer, restaging on chemotherapy. EXAM: CT CHEST WITH CONTRAST TECHNIQUE: Multidetector CT imaging of the chest was performed during intravenous contrast administration. CONTRAST:  3mL OMNIPAQUE IOHEXOL 300 MG/ML  SOLN COMPARISON:  CT abdomen pelvis 03/25/2021 and CT chest 11/19/2020. FINDINGS: Cardiovascular: Right IJ Port-A-Cath terminates in the right atrium. Atherosclerotic calcification of the aorta, aortic valve and coronary arteries. Heart is at the upper limits of normal in size. No pericardial effusion.  Mediastinum/Nodes: No pathologically enlarged mediastinal, hilar, internal mammary or axillary lymph nodes. Esophagus is grossly unremarkable. Lungs/Pleura: Biapical pleuroparenchymal scarring. Centrilobular and paraseptal emphysema. Scarring in the right middle lobe and lingula as well as both lower lobes. Left lower lobe nodule measures 1.0 x 1.4 cm (3/106), decreased from 2.1 x 2.6 cm on 11/19/2020. Other millimetric pulmonary nodules are unchanged. No pleural fluid. Airway is unremarkable. Upper Abdomen: Subcentimeter low-attenuation lesion segment 4, too small to characterize. Visualized portions of the liver and gallbladder are otherwise unremarkable. Thickening of the adrenal glands bilaterally. Visualized portion of the right kidney is unremarkable. 8 mm low-attenuation lesion in the upper pole left kidney, too small to characterize. Visualized portions of the spleen, pancreas, stomach and bowel are unremarkable with the exception of a small hiatal hernia. Musculoskeletal: Degenerative changes in the spine. No worrisome lytic or sclerotic lesions. IMPRESSION: 1. Interval decrease in size of left lower lobe nodule. No evidence of metastatic disease. 2. Aortic atherosclerosis (ICD10-I70.0). Coronary artery calcification. 3.  Emphysema (ICD10-J43.9). Electronically Signed   By: Lorin Picket M.D.   On: 03/30/2021 16:08   NM Bone Scan Whole Body  Result Date: 04/07/2021 CLINICAL DATA:  Lung cancer restaging. EXAM: NUCLEAR MEDICINE WHOLE BODY BONE SCAN TECHNIQUE: Whole body anterior and posterior images were obtained approximately 3 hours after intravenous injection of radiopharmaceutical. RADIOPHARMACEUTICALS:  22.5 mCi Technetium-23m MDP IV COMPARISON:  CT 03/30/2021.  CT 03/25/2021.  PET CT 11/27/2020. FINDINGS: Bilateral renal function and excretion.  Mild hydronephrosis on the right cannot be excluded. Focal area of increased activity over the right ilium is again noted. No other abnormal areas of  increased activity noted. IMPRESSION: 1. Focal area of increased activity over the right ilium again noted as noted on prior PET-CT of 11/27/2020. No other focal abnormal areas of bony activity noted. 2.  Mild right hydronephrosis cannot be excluded. Electronically Signed   By: Marcello Moores  Register M.D.   On: 04/07/2021 07:20   CT ABDOMEN PELVIS W CONTRAST  Addendum Date: 04/06/2021   ADDENDUM REPORT: 04/06/2021 15:10 ADDENDUM: No suspicious lytic or blastic osseous lesions. Hypermetabolic activity noted on PET CT 11/27/2020 does not correlate to a CT finding on today's study. This does not exclude an underlying lesion that may be seen on PET. Electronically Signed   By: Iven Finn M.D.   On: 04/06/2021 15:10   Result Date: 04/06/2021 CLINICAL DATA:  Nonlocalized acute abdominal pain. diarrhea, vomiting for 4-5 days. Last chemo 16 days ago. Has lung, hip and brain cancer. States getting worse instead of better since chemo. NAD noted. Alert and oriented EXAM: CT ABDOMEN AND PELVIS WITH CONTRAST TECHNIQUE: Multidetector CT imaging of the abdomen and pelvis was performed using the standard protocol following bolus administration of intravenous contrast. CONTRAST:  2mL OMNIPAQUE IOHEXOL 350 MG/ML SOLN COMPARISON:  CT chest 11/19/2020 FINDINGS: Lower chest: Linear atelectasis versus scarring within the left lower lobe. Tiny hiatal hernia Hepatobiliary: No focal liver abnormality. No gallstones, gallbladder wall thickening, or pericholecystic fluid. No biliary dilatation. Pancreas: No focal lesion. Normal pancreatic contour. No surrounding inflammatory changes. No main pancreatic ductal dilatation. Spleen: Normal in size without focal abnormality. Adrenals/Urinary Tract: No adrenal nodule bilaterally. Bilateral kidneys enhance symmetrically. Subcentimeter hypodensities too small to characterize. No hydronephrosis. No hydroureter. The urinary bladder is unremarkable. On delayed imaging, there is no urothelial wall  thickening and there are no filling defects in the opacified portions of the bilateral collecting systems or ureters. Stomach/Bowel: Oval density within the pyloric lumen likely related to medication ingestion. Stomach is within normal limits. No evidence of bowel wall thickening or dilatation. Appendix appears normal. Vascular/Lymphatic: No abdominal aorta or iliac aneurysm. Severe atherosclerotic plaque of the aorta and its branches. No abdominal, pelvic, or inguinal lymphadenopathy. Reproductive: Status post hysterectomy. No adnexal masses. Other: No intraperitoneal free fluid. No intraperitoneal free gas. No organized fluid collection. Musculoskeletal: No abdominal wall hernia or abnormality. No suspicious lytic or blastic osseous lesions. No acute displaced fracture. IMPRESSION: 1. No acute intra-abdominal or intrapelvic abnormality. 2. Tiny hiatal hernia. 3.  Aortic Atherosclerosis (ICD10-I70.0). Electronically Signed: By: Iven Finn M.D. On: 03/25/2021 19:01     Assessment and plan- Patient is a 75 y.o. female with extensive stage small cell lung cancer with bone and brain metastases s/p 4 cycles of carbo etoposide Tecentriq chemotherapy who returns to clinic for further evaluation and consideration of IV fluids.   Convalescence following chemotherapy- combination of treatment, malignancy, malnutrition most likely. Decadron 10 mg IV today.  Hyponatremia- Improved with sodium supplementation and fluids. Will stop hctz. She can continue losartan for blood pressure. IV fluids today. Increase sodium chloride to four times a day.  Hypomagnesemia- Mag 1.3 today. Give Magnesium 4 g IV in clinic today.  Hypokalemia- no IV potassium today Anemia- hemoglobin 8.7 today. Stable. Monitor closely. Plan for transfusion for hemoglobin < 7.  Thrombocytopenia- plt 119. Stable. Monitor.  Malnutrition- encouraged small frequent protein and calorie dense foods.  Nausea- etiology unclear. Continue antiemetics.  Discussed  avoiding high fat foods as this may exacerbate symptoms.    Visit Diagnosis 1. Small cell lung cancer in adult [facility])   2. Bone metastases ([place])    Beckey Rutter, DNP, AGNP-C [facility] at [facility] [phone] (clinic) 04/07/2021 2:40 PM

## 2021-04-07 NOTE — Progress Notes (Signed)
Pt in for follow up reports still having nausea and vomiting.  Pt reports taking azo and antibiotic for UTI. States it has improved.

## 2021-04-07 NOTE — Patient Instructions (Signed)
Stop losartan-hctz. Start losartan if systolic blood pressure is > 120.  Increase sodium chloride to 3 times a day. We'll check labs and give you fluids and electrolytes on Thursday.  Drink electrolyte drinks such as gatorade. Continue Boost. Continue zofran for nausea.

## 2021-04-09 ENCOUNTER — Inpatient Hospital Stay: Payer: Medicare Other

## 2021-04-09 ENCOUNTER — Other Ambulatory Visit: Payer: Self-pay

## 2021-04-09 VITALS — BP 137/52 | HR 69 | Temp 99.2°F | Resp 18

## 2021-04-09 DIAGNOSIS — E876 Hypokalemia: Secondary | ICD-10-CM

## 2021-04-09 DIAGNOSIS — Z95828 Presence of other vascular implants and grafts: Secondary | ICD-10-CM

## 2021-04-09 DIAGNOSIS — C3490 Malignant neoplasm of unspecified part of unspecified bronchus or lung: Secondary | ICD-10-CM

## 2021-04-09 DIAGNOSIS — C7951 Secondary malignant neoplasm of bone: Secondary | ICD-10-CM

## 2021-04-09 DIAGNOSIS — Z5112 Encounter for antineoplastic immunotherapy: Secondary | ICD-10-CM | POA: Diagnosis not present

## 2021-04-09 DIAGNOSIS — R531 Weakness: Secondary | ICD-10-CM

## 2021-04-09 DIAGNOSIS — C7931 Secondary malignant neoplasm of brain: Secondary | ICD-10-CM

## 2021-04-09 LAB — COMPREHENSIVE METABOLIC PANEL
ALT: 9 U/L (ref 0–44)
AST: 17 U/L (ref 15–41)
Albumin: 3.5 g/dL (ref 3.5–5.0)
Alkaline Phosphatase: 63 U/L (ref 38–126)
Anion gap: 9 (ref 5–15)
BUN: 14 mg/dL (ref 8–23)
CO2: 24 mmol/L (ref 22–32)
Calcium: 8.6 mg/dL — ABNORMAL LOW (ref 8.9–10.3)
Chloride: 92 mmol/L — ABNORMAL LOW (ref 98–111)
Creatinine, Ser: 0.79 mg/dL (ref 0.44–1.00)
GFR, Estimated: >60 mL/min (ref >60)
Glucose, Bld: 139 mg/dL — ABNORMAL HIGH (ref 70–99)
Potassium: 2.9 mmol/L — ABNORMAL LOW (ref 3.5–5.1)
Sodium: 125 mmol/L — ABNORMAL LOW (ref 135–145)
Total Bilirubin: 0.7 mg/dL (ref 0.3–1.2)
Total Protein: 6.3 g/dL — ABNORMAL LOW (ref 6.5–8.1)

## 2021-04-09 LAB — CBC WITH DIFFERENTIAL/PLATELET
Abs Immature Granulocytes: 0.17 10*3/uL — ABNORMAL HIGH (ref 0.00–0.07)
Basophils Absolute: 0 10*3/uL (ref 0.0–0.1)
Basophils Relative: 0 %
Eosinophils Absolute: 0.1 10*3/uL (ref 0.0–0.5)
Eosinophils Relative: 1 %
HCT: 28 % — ABNORMAL LOW (ref 36.0–46.0)
Hemoglobin: 9.4 g/dL — ABNORMAL LOW (ref 12.0–15.0)
Immature Granulocytes: 2 %
Lymphocytes Relative: 6 %
Lymphs Abs: 0.7 10*3/uL (ref 0.7–4.0)
MCH: 32.4 pg (ref 26.0–34.0)
MCHC: 33.6 g/dL (ref 30.0–36.0)
MCV: 96.6 fL (ref 80.0–100.0)
Monocytes Absolute: 1 10*3/uL (ref 0.1–1.0)
Monocytes Relative: 10 %
Neutro Abs: 8.6 10*3/uL — ABNORMAL HIGH (ref 1.7–7.7)
Neutrophils Relative %: 81 %
Platelets: 162 10*3/uL (ref 150–400)
RBC: 2.9 MIL/uL — ABNORMAL LOW (ref 3.87–5.11)
RDW: 22.8 % — ABNORMAL HIGH (ref 11.5–15.5)
WBC: 10.6 10*3/uL — ABNORMAL HIGH (ref 4.0–10.5)
nRBC: 0 % (ref 0.0–0.2)

## 2021-04-09 MED ORDER — HEPARIN SOD (PORK) LOCK FLUSH 100 UNIT/ML IV SOLN
500.0000 [IU] | Freq: Once | INTRAVENOUS | Status: AC | PRN
  Administered 2021-04-09: 500 [IU]
  Filled 2021-04-09: qty 5

## 2021-04-09 MED ORDER — POTASSIUM CHLORIDE IN NACL 40-0.9 MEQ/L-% IV SOLN
INTRAVENOUS | Status: DC
  Filled 2021-04-09 (×2): qty 1000

## 2021-04-09 MED ORDER — SODIUM CHLORIDE 0.9% FLUSH
10.0000 mL | Freq: Once | INTRAVENOUS | Status: AC | PRN
  Administered 2021-04-09: 10 mL
  Filled 2021-04-09: qty 10

## 2021-04-09 MED ORDER — SODIUM CHLORIDE 0.9 % IV SOLN
Freq: Once | INTRAVENOUS | Status: AC
  Filled 2021-04-09: qty 250

## 2021-04-12 ENCOUNTER — Other Ambulatory Visit: Payer: Self-pay | Admitting: *Deleted

## 2021-04-12 DIAGNOSIS — C7951 Secondary malignant neoplasm of bone: Secondary | ICD-10-CM

## 2021-04-12 DIAGNOSIS — C3490 Malignant neoplasm of unspecified part of unspecified bronchus or lung: Secondary | ICD-10-CM

## 2021-04-13 ENCOUNTER — Telehealth: Payer: Medicare Other | Admitting: Hospice and Palliative Medicine

## 2021-04-14 ENCOUNTER — Encounter: Payer: Medicare Other | Admitting: Hospice and Palliative Medicine

## 2021-04-14 ENCOUNTER — Inpatient Hospital Stay: Payer: Medicare Other

## 2021-04-14 ENCOUNTER — Inpatient Hospital Stay (HOSPITAL_BASED_OUTPATIENT_CLINIC_OR_DEPARTMENT_OTHER): Payer: Medicare Other | Admitting: Oncology

## 2021-04-14 ENCOUNTER — Encounter: Payer: Self-pay | Admitting: Oncology

## 2021-04-14 VITALS — BP 169/75 | HR 91 | Temp 98.7°F | Resp 20 | Wt 144.0 lb

## 2021-04-14 DIAGNOSIS — C7951 Secondary malignant neoplasm of bone: Secondary | ICD-10-CM

## 2021-04-14 DIAGNOSIS — Z5112 Encounter for antineoplastic immunotherapy: Secondary | ICD-10-CM

## 2021-04-14 DIAGNOSIS — C7931 Secondary malignant neoplasm of brain: Secondary | ICD-10-CM

## 2021-04-14 DIAGNOSIS — C3490 Malignant neoplasm of unspecified part of unspecified bronchus or lung: Secondary | ICD-10-CM

## 2021-04-14 DIAGNOSIS — D8989 Other specified disorders involving the immune mechanism, not elsewhere classified: Secondary | ICD-10-CM

## 2021-04-14 DIAGNOSIS — Z95828 Presence of other vascular implants and grafts: Secondary | ICD-10-CM

## 2021-04-14 LAB — CBC WITH DIFFERENTIAL/PLATELET
Abs Immature Granulocytes: 0.15 10*3/uL — ABNORMAL HIGH (ref 0.00–0.07)
Basophils Absolute: 0 10*3/uL (ref 0.0–0.1)
Basophils Relative: 0 %
Eosinophils Absolute: 0.4 10*3/uL (ref 0.0–0.5)
Eosinophils Relative: 4 %
HCT: 30 % — ABNORMAL LOW (ref 36.0–46.0)
Hemoglobin: 10.1 g/dL — ABNORMAL LOW (ref 12.0–15.0)
Immature Granulocytes: 2 %
Lymphocytes Relative: 8 %
Lymphs Abs: 0.7 10*3/uL (ref 0.7–4.0)
MCH: 33.3 pg (ref 26.0–34.0)
MCHC: 33.7 g/dL (ref 30.0–36.0)
MCV: 99 fL (ref 80.0–100.0)
Monocytes Absolute: 0.7 10*3/uL (ref 0.1–1.0)
Monocytes Relative: 8 %
Neutro Abs: 7.5 10*3/uL (ref 1.7–7.7)
Neutrophils Relative %: 78 %
Platelets: 146 10*3/uL — ABNORMAL LOW (ref 150–400)
RBC: 3.03 MIL/uL — ABNORMAL LOW (ref 3.87–5.11)
RDW: 22.8 % — ABNORMAL HIGH (ref 11.5–15.5)
WBC: 9.5 10*3/uL (ref 4.0–10.5)
nRBC: 0 % (ref 0.0–0.2)

## 2021-04-14 LAB — COMPREHENSIVE METABOLIC PANEL
ALT: 11 U/L (ref 0–44)
AST: 18 U/L (ref 15–41)
Albumin: 3.5 g/dL (ref 3.5–5.0)
Alkaline Phosphatase: 60 U/L (ref 38–126)
Anion gap: 8 (ref 5–15)
BUN: 10 mg/dL (ref 8–23)
CO2: 22 mmol/L (ref 22–32)
Calcium: 8.8 mg/dL — ABNORMAL LOW (ref 8.9–10.3)
Chloride: 100 mmol/L (ref 98–111)
Creatinine, Ser: 0.79 mg/dL (ref 0.44–1.00)
GFR, Estimated: >60 mL/min (ref >60)
Glucose, Bld: 106 mg/dL — ABNORMAL HIGH (ref 70–99)
Potassium: 4 mmol/L (ref 3.5–5.1)
Sodium: 130 mmol/L — ABNORMAL LOW (ref 135–145)
Total Bilirubin: 0.6 mg/dL (ref 0.3–1.2)
Total Protein: 6.3 g/dL — ABNORMAL LOW (ref 6.5–8.1)

## 2021-04-14 LAB — TSH: TSH: 2.354 u[IU]/mL (ref 0.350–4.500)

## 2021-04-14 MED ORDER — SODIUM CHLORIDE 0.9 % IV SOLN
Freq: Once | INTRAVENOUS | Status: AC
  Filled 2021-04-14: qty 250

## 2021-04-14 MED ORDER — SODIUM CHLORIDE 0.9% FLUSH
10.0000 mL | Freq: Once | INTRAVENOUS | Status: AC
Start: 2021-04-14 — End: 2021-04-14
  Administered 2021-04-14: 10 mL via INTRAVENOUS
  Filled 2021-04-14: qty 10

## 2021-04-14 MED ORDER — ATEZOLIZUMAB CHEMO INJECTION 1200 MG/20ML
1200.0000 mg | Freq: Once | INTRAVENOUS | Status: AC
  Administered 2021-04-14: 1200 mg via INTRAVENOUS
  Filled 2021-04-14: qty 20

## 2021-04-14 MED ORDER — HEPARIN SOD (PORK) LOCK FLUSH 100 UNIT/ML IV SOLN
500.0000 [IU] | Freq: Once | INTRAVENOUS | Status: AC
  Administered 2021-04-14: 500 [IU] via INTRAVENOUS
  Filled 2021-04-14: qty 5

## 2021-04-14 NOTE — Progress Notes (Signed)
Patient daughter states she has noticed her mother voice being raspy. Patient daughter asked about Brain Scan that was discussed to be scheduled in september.

## 2021-04-14 NOTE — Progress Notes (Signed)
I connected with Beth Mcmillan on 04/14/21 at  9:30 AM EDT by video enabled telemedicine visit and verified that I am speaking with the correct person using two identifiers.   I discussed the limitations, risks, security and privacy concerns of performing an evaluation and management service by telemedicine and the availability of in-person appointments. I also discussed with the patient that there may be a patient responsible charge related to this service. The patient expressed understanding and agreed to proceed.  Other persons participating in the visit and their role in the encounter:  patients daughter  Patient's location:  cancer center Provider's location:  home  Chief Complaint:  on treatment assessment prior to cycle 1 of palliative tecentriq  History of present illness: Patient is a 75 year old female with extensive smoking history and currently smokes half a pack per day.  She underwent CT chest with contrast in March 2022 following an abnormal x-ray which showed a 2.6 cm mass in the left lower lobe.  This was followed by a PET CT scan which showed a 3.0 x 2.0 cm mass in the left lower lobe of the lung with an SUV of 9.7.  No evidence of locoregional adenopathy.  No evidence of intra-abdominal disease with patient was noted to have a hypermetabolic focus in the right iliac bone with an SUV of 11.1 which was suspicious for metastatic disease.  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  Cells were positive for TTF-1, CD56 and chromogranin.  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  Patient completed whole  brain radiation treatment and completed carbo etoposide Tecentriq 4 cycles on 03/10/2021.  Interval history she feels better after not having received treatment for a month now. She is ambulating with a cane. No falls. She was able to do some household chores like folding clothes.    Review of Systems  Constitutional:  Positive for malaise/fatigue. Negative for chills, fever and weight loss.  HENT:  Negative for congestion, ear discharge and nosebleeds.   Eyes:  Negative for blurred vision.  Respiratory:  Negative for cough, hemoptysis, sputum production, shortness of breath and wheezing.   Cardiovascular:  Negative for chest pain, palpitations, orthopnea and claudication.  Gastrointestinal:  Negative for abdominal pain, blood in stool, constipation, diarrhea, heartburn, melena, nausea and vomiting.  Genitourinary:  Negative for dysuria, flank pain, frequency, hematuria and urgency.  Musculoskeletal:  Negative for back pain, joint pain and myalgias.  Skin:  Negative for rash.  Neurological:  Negative for dizziness, tingling, focal weakness, seizures, weakness and headaches.  Endo/Heme/Allergies:  Does not bruise/bleed easily.  Psychiatric/Behavioral:  Negative for depression and suicidal ideas. The patient does not have insomnia.    Allergies  Allergen Reactions   Augmentin [Amoxicillin-Pot Clavulanate]     "messes up my blood cells"   Codeine Nausea Only   Levaquin [Levofloxacin]     Delusions    Sulfa Antibiotics Nausea And Vomiting   Vancomycin Other (See Comments)    [place] syndome- happened in the hospital at [facility]   Tape Rash    Some bandaids cause rash sometimes    Past Medical History:  Diagnosis Date  Anxiety    Breathing problem    low breathing function 53%   COPD (chronic obstructive pulmonary disease) (HCC)    EMPHYSEMA   Dysrhythmia    tachycardia   Emphysema of lung (HCC)    Hemorrhoid    History of hiatal hernia    SMALL- 2022   Hypercholesteremia     Hypertension    Hypothyroidism    Larynx polyp    Lung cancer (HCC)    Palpitations    with anxiety   Platelets decreased ([place]) 2020   Seasonal allergies    Tinnitus    Vertigo    no episodes in several years   Wears dentures    partial lower   White coat syndrome with hypertension     Past Surgical History:  Procedure Laterality Date   ABDOMINAL HYSTERECTOMY     partial   COLONOSCOPY WITH PROPOFOL N/A 01/22/2016   Procedure: COLONOSCOPY WITH PROPOFOL;  Surgeon: Lucilla Lame, MD;  Location: [place];  Service: Endoscopy;  Laterality: N/A;   COLONOSCOPY WITH PROPOFOL N/A 04/12/2019   Procedure: COLONOSCOPY WITH BIOPSY;  Surgeon: Lucilla Lame, MD;  Location: [place];  Service: Endoscopy;  Laterality: N/A;  pt would like an early appt   ct guided lung bx     IR IMAGING GUIDED PORT INSERTION  12/18/2020   POLYPECTOMY  01/22/2016   Procedure: POLYPECTOMY;  Surgeon: Lucilla Lame, MD;  Location: [place];  Service: Endoscopy;;   POLYPECTOMY N/A 04/12/2019   Procedure: POLYPECTOMY;  Surgeon: Lucilla Lame, MD;  Location: [place];  Service: Endoscopy;  Laterality: N/A;   VIDEO BRONCHOSCOPY WITH ENDOBRONCHIAL NAVIGATION N/A 11/21/2020   Procedure: VIDEO BRONCHOSCOPY WITH ENDOBRONCHIAL NAVIGATION;  Surgeon: Ottie Glazier, MD;  Location: [facility];  Service: Thoracic;  Laterality: N/A;   VIDEO BRONCHOSCOPY WITH ENDOBRONCHIAL ULTRASOUND N/A 11/21/2020   Procedure: VIDEO BRONCHOSCOPY WITH ENDOBRONCHIAL ULTRASOUND;  Surgeon: Ottie Glazier, MD;  Location: [facility];  Service: Thoracic;  Laterality: N/A;    Social History   Socioeconomic History   Marital status: Married    Spouse name: Not on file   Number of children: Not on file   Years of education: Not on file   Highest education level: Not on file  Occupational History   Not on file  Tobacco Use   Smoking status: Former    Packs/day: 0.50    Years: 50.00    Pack years: 25.00    Types:  Cigarettes    Quit date: 11/03/2020    Years since quitting: 0.4   Smokeless tobacco: Never  Vaping Use   Vaping Use: Some days  Substance and Sexual Activity   Alcohol use: No   Drug use: Never   Sexual activity: Not on file  Other Topics Concern   Not on file  Social History Narrative   Not on file   Social Determinants of Health   Financial Resource Strain: Not on file  Food Insecurity: Not on file  Transportation Needs: Not on file  Physical Activity: Not on file  Stress: Not on file  Social Connections: Not on file  Intimate Partner Violence: Not on file    Family History  Problem Relation Age of Onset   Alzheimer's disease Mother    Heart attack Mother    High Cholesterol Mother    Hypertension Mother    Heart block Mother    Lung cancer Father    Uterine cancer Sister    Hypertension Sister  Heart block Sister    Anxiety disorder Sister    Breast cancer Neg Hx      Current Outpatient Medications:    acetaminophen (TYLENOL) 500 MG tablet, Take 500 mg by mouth every 6 (six) hours as needed for mild pain or moderate pain., Disp: , Rfl:    atorvastatin (LIPITOR) 10 MG tablet, TAKE 1 TABLET BY MOUTH DAILY, Disp: 90 tablet, Rfl: 0   Fluticasone-Umeclidin-Vilant (TRELEGY ELLIPTA) 100-62.5-25 MCG/INH AEPB, Inhale 1 puff into the lungs daily., Disp: 1 each, Rfl: 2   ketotifen (ZADITOR) 0.025 % ophthalmic solution, Place 1 drop into both eyes 2 (two) times daily as needed., Disp: , Rfl:    levothyroxine (SYNTHROID, LEVOTHROID) 50 MCG tablet, Take 50 mcg by mouth daily before breakfast., Disp: , Rfl:    loperamide (IMODIUM) 2 MG capsule, Take 2 mg by mouth as needed for diarrhea or loose stools., Disp: , Rfl:    loratadine (CLARITIN) 10 MG tablet, Take 10 mg by mouth daily. Reported on 01/22/2016, Disp: , Rfl:    LORazepam (ATIVAN) 0.5 MG tablet, Take 1 tablet (0.5 mg total) by mouth every 6 (six) hours as needed for anxiety., Disp: 120 tablet, Rfl: 0   losartan (COZAAR)  100 MG tablet, Take 1 tablet (100 mg total) by mouth daily., Disp: 90 tablet, Rfl: 0   magnesium chloride (SLOW-MAG) 64 MG TBEC SR tablet, Take 1 tablet (64 mg total) by mouth daily., Disp: 30 tablet, Rfl: 0   metoprolol succinate (TOPROL-XL) 50 MG 24 hr tablet, Take 50 mg by mouth daily., Disp: , Rfl:    ondansetron (ZOFRAN) 8 MG tablet, TAKE 1 TABLET BY MOUTH TWICE DAILY AS NEEDED FOR REFRACTORY NAUSEA/ VOMITING. START ON DAY 3 AFTER CARBOPLATIN CHEMO, Disp: 30 tablet, Rfl: 3   potassium chloride SA (KLOR-CON) 20 MEQ tablet, TAKE 1 TABLET(20 MEQ) BY MOUTH DAILY, Disp: 28 tablet, Rfl: 0   prochlorperazine (COMPAZINE) 10 MG tablet, Take 10 mg by mouth as needed for nausea or vomiting., Disp: , Rfl:    QUEtiapine (SEROQUEL) 25 MG tablet, Take 1 tablet (25 mg total) by mouth at bedtime., Disp: 30 tablet, Rfl: 2   sodium chloride 1 g tablet, Take 1 tablet (1 g total) by mouth 3 (three) times daily. (Patient taking differently: Take 1 g by mouth in the morning, at noon, in the evening, and at bedtime.), Disp: 42 tablet, Rfl: 0   Ergocalciferol 10 MCG (400 UNIT) TABS, Take by mouth., Disp: , Rfl:    lidocaine (XYLOCAINE) 2 % solution, SMARTSIG:By Mouth, Disp: , Rfl:    nitrofurantoin, macrocrystal-monohydrate, (MACROBID) 100 MG capsule, Take 1 capsule (100 mg total) by mouth 2 (two) times daily., Disp: 10 capsule, Rfl: 0   Phenazopyridine HCl (AZO URINARY PAIN PO), Take by mouth., Disp: , Rfl:  No current facility-administered medications for this visit.  Facility-Administered Medications Ordered in Other Visits:    0.9 %  sodium chloride infusion, , Intravenous, Once, Earlie Server, MD  CT CHEST W CONTRAST  Result Date: 03/30/2021 CLINICAL DATA:  Lung cancer, restaging on chemotherapy. EXAM: CT CHEST WITH CONTRAST TECHNIQUE: Multidetector CT imaging of the chest was performed during intravenous contrast administration. CONTRAST:  31mL OMNIPAQUE IOHEXOL 300 MG/ML  SOLN COMPARISON:  CT abdomen pelvis  03/25/2021 and CT chest 11/19/2020. FINDINGS: Cardiovascular: Right IJ Port-A-Cath terminates in the right atrium. Atherosclerotic calcification of the aorta, aortic valve and coronary arteries. Heart is at the upper limits of normal in size. No pericardial effusion. Mediastinum/Nodes: No pathologically  enlarged mediastinal, hilar, internal mammary or axillary lymph nodes. Esophagus is grossly unremarkable. Lungs/Pleura: Biapical pleuroparenchymal scarring. Centrilobular and paraseptal emphysema. Scarring in the right middle lobe and lingula as well as both lower lobes. Left lower lobe nodule measures 1.0 x 1.4 cm (3/106), decreased from 2.1 x 2.6 cm on 11/19/2020. Other millimetric pulmonary nodules are unchanged. No pleural fluid. Airway is unremarkable. Upper Abdomen: Subcentimeter low-attenuation lesion segment 4, too small to characterize. Visualized portions of the liver and gallbladder are otherwise unremarkable. Thickening of the adrenal glands bilaterally. Visualized portion of the right kidney is unremarkable. 8 mm low-attenuation lesion in the upper pole left kidney, too small to characterize. Visualized portions of the spleen, pancreas, stomach and bowel are unremarkable with the exception of a small hiatal hernia. Musculoskeletal: Degenerative changes in the spine. No worrisome lytic or sclerotic lesions. IMPRESSION: 1. Interval decrease in size of left lower lobe nodule. No evidence of metastatic disease. 2. Aortic atherosclerosis (ICD10-I70.0). Coronary artery calcification. 3.  Emphysema (ICD10-J43.9). Electronically Signed   By: Lorin Picket M.D.   On: 03/30/2021 16:08   NM Bone Scan Whole Body  Result Date: 04/07/2021 CLINICAL DATA:  Lung cancer restaging. EXAM: NUCLEAR MEDICINE WHOLE BODY BONE SCAN TECHNIQUE: Whole body anterior and posterior images were obtained approximately 3 hours after intravenous injection of radiopharmaceutical. RADIOPHARMACEUTICALS:  22.5 mCi Technetium-22m MDP IV  COMPARISON:  CT 03/30/2021.  CT 03/25/2021.  PET CT 11/27/2020. FINDINGS: Bilateral renal function and excretion. Mild hydronephrosis on the right cannot be excluded. Focal area of increased activity over the right ilium is again noted. No other abnormal areas of increased activity noted. IMPRESSION: 1. Focal area of increased activity over the right ilium again noted as noted on prior PET-CT of 11/27/2020. No other focal abnormal areas of bony activity noted. 2.  Mild right hydronephrosis cannot be excluded. Electronically Signed   By: Marcello Moores  Register M.D.   On: 04/07/2021 07:20   CT ABDOMEN PELVIS W CONTRAST  Addendum Date: 04/06/2021   ADDENDUM REPORT: 04/06/2021 15:10 ADDENDUM: No suspicious lytic or blastic osseous lesions. Hypermetabolic activity noted on PET CT 11/27/2020 does not correlate to a CT finding on today's study. This does not exclude an underlying lesion that may be seen on PET. Electronically Signed   By: Iven Finn M.D.   On: 04/06/2021 15:10   Result Date: 04/06/2021 CLINICAL DATA:  Nonlocalized acute abdominal pain. diarrhea, vomiting for 4-5 days. Last chemo 16 days ago. Has lung, hip and brain cancer. States getting worse instead of better since chemo. NAD noted. Alert and oriented EXAM: CT ABDOMEN AND PELVIS WITH CONTRAST TECHNIQUE: Multidetector CT imaging of the abdomen and pelvis was performed using the standard protocol following bolus administration of intravenous contrast. CONTRAST:  21mL OMNIPAQUE IOHEXOL 350 MG/ML SOLN COMPARISON:  CT chest 11/19/2020 FINDINGS: Lower chest: Linear atelectasis versus scarring within the left lower lobe. Tiny hiatal hernia Hepatobiliary: No focal liver abnormality. No gallstones, gallbladder wall thickening, or pericholecystic fluid. No biliary dilatation. Pancreas: No focal lesion. Normal pancreatic contour. No surrounding inflammatory changes. No main pancreatic ductal dilatation. Spleen: Normal in size without focal abnormality.  Adrenals/Urinary Tract: No adrenal nodule bilaterally. Bilateral kidneys enhance symmetrically. Subcentimeter hypodensities too small to characterize. No hydronephrosis. No hydroureter. The urinary bladder is unremarkable. On delayed imaging, there is no urothelial wall thickening and there are no filling defects in the opacified portions of the bilateral collecting systems or ureters. Stomach/Bowel: Oval density within the pyloric lumen likely related to medication ingestion.  Stomach is within normal limits. No evidence of bowel wall thickening or dilatation. Appendix appears normal. Vascular/Lymphatic: No abdominal aorta or iliac aneurysm. Severe atherosclerotic plaque of the aorta and its branches. No abdominal, pelvic, or inguinal lymphadenopathy. Reproductive: Status post hysterectomy. No adnexal masses. Other: No intraperitoneal free fluid. No intraperitoneal free gas. No organized fluid collection. Musculoskeletal: No abdominal wall hernia or abnormality. No suspicious lytic or blastic osseous lesions. No acute displaced fracture. IMPRESSION: 1. No acute intra-abdominal or intrapelvic abnormality. 2. Tiny hiatal hernia. 3.  Aortic Atherosclerosis (ICD10-I70.0). Electronically Signed: By: Iven Finn M.D. On: 03/25/2021 19:01    No images are attached to the encounter.   CMP Latest Ref Rng & Units 04/14/2021  Glucose 70 - 99 mg/dL 106(H)  BUN 8 - 23 mg/dL 10  Creatinine 0.44 - 1.00 mg/dL 0.79  Sodium 135 - 145 mmol/L 130(L)  Potassium 3.5 - 5.1 mmol/L 4.0  Chloride 98 - 111 mmol/L 100  CO2 22 - 32 mmol/L 22  Calcium 8.9 - 10.3 mg/dL 8.8(L)  Total Protein 6.5 - 8.1 g/dL 6.3(L)  Total Bilirubin 0.3 - 1.2 mg/dL 0.6  Alkaline Phos 38 - 126 U/L 60  AST 15 - 41 U/L 18  ALT 0 - 44 U/L 11   CBC Latest Ref Rng & Units 04/14/2021  WBC 4.0 - 10.5 K/uL 9.5  Hemoglobin 12.0 - 15.0 g/dL 10.1(L)  Hematocrit 36.0 - 46.0 % 30.0(L)  Platelets 150 - 400 K/uL 146(L)     Observation/objective:sitting  in a wheelchair. Appears in no acute distress  Assessment and plan:Patient is a 75 y.o. 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 She is here for on treatment assessment prior to cycle 1 of palliative tecentriq  I have reviewed CT chest abdomen pelvis as well as bone scan images independently.  Overall there has been a decrease in the size of the primary lung mass.  Right iliac bone lesion not well characterized on CT as compared to PET scan but overall remained stable.  No areas of new disease.  She has had partial response.  Her plan is to continue palliative Tecentriq every 3 weeks until progression or toxicity.  Patient had significant decline in her performance status after [place] etoposide based chemotherapy as well as whole brain radiation.  She has not had any treatment for 4 weeks now and overall seems to be doing better.  We will restart Tecentriq today  During her last visit her hydrochlorothiazide was stopped and today her blood pressure is 169/75 in the office.  She has also been monitoring her blood pressures at home and systolic blood pressures have been mostly in the 160s.  I will therefore ask her to restart her hydrochlorothiazide at this time.  She will keep a log of her blood pressures and bring it to [place] at her next visit.  Hyponatremia: She was started on sodium tablets and her sodium is somewhat improved to 130 today.  Given her hypertension today I am not giving her extra fluids.  Depending on what her BMP looks like in 10 days we will see if we can cut down on her sodium tablet dosage.  BMP, see covering NP in 10 days for possible fluids.  Port labs CBC with differential, CMP and see Dr. Janese Banks in 3 weeks for possible Tecentriq  Follow-up instructions:  I discussed the assessment and treatment plan with the patient. The patient was provided an  opportunity to ask questions and all were answered. The patient  agreed with the plan and demonstrated an understanding of the instructions.   The patient was advised to call back or seek an in-person evaluation if the symptoms worsen or if the condition fails to improve as anticipated.   Visit Diagnosis: 1. Lung cancer metastatic to brain ([place])   2. Encounter for antineoplastic immunotherapy     Dr. Randa Evens, MD, MPH [facility] at [facility] Tel- [phone] 04/14/2021 6:11 PM

## 2021-04-14 NOTE — Progress Notes (Signed)
Nutrition  RD working remotely due to Health at Work recommendations.    RD planning to call patient while in infusion.  Spoke with nursing and patient does not have phone with her.  RD will plan to see patient when back in clinic. Patient has been given RD contact information on previous visits.  Daneille Desilva B. Zenia Resides, [place], [place] Registered Dietitian [phone] (mobile)

## 2021-04-14 NOTE — Patient Instructions (Addendum)
[facility]  Discharge Instructions: Thank you for choosing [facility] to provide your oncology and hematology care.  If you have a lab appointment with the [facility], please go directly to the [facility] and check in at the registration area.  Wear comfortable clothing and clothing appropriate for easy access to any Portacath or PICC line.   We strive to give you quality time with your provider. You may need to reschedule your appointment if you arrive late (15 or more minutes).  Arriving late affects you and other patients whose appointments are after yours.  Also, if you miss three or more appointments without notifying the office, you may be dismissed from the clinic at the provider's discretion.      For prescription refill requests, have your pharmacy contact our office and allow 72 hours for refills to be completed. Today you received the following chemotherapy and/or immunotherapy agents : Tecentriq   To help prevent nausea and vomiting after your treatment, we encourage you to take your nausea medication as directed.  BELOW ARE SYMPTOMS THAT SHOULD BE REPORTED IMMEDIATELY: *FEVER GREATER THAN 100.4 F (38 C) OR HIGHER *CHILLS OR SWEATING *NAUSEA AND VOMITING THAT IS NOT CONTROLLED WITH YOUR NAUSEA MEDICATION *UNUSUAL SHORTNESS OF BREATH *UNUSUAL BRUISING OR BLEEDING *URINARY PROBLEMS (pain or burning when urinating, or frequent urination) *BOWEL PROBLEMS (unusual diarrhea, constipation, pain near the anus) TENDERNESS IN MOUTH AND THROAT WITH OR WITHOUT PRESENCE OF ULCERS (sore throat, sores in mouth, or a toothache) UNUSUAL RASH, SWELLING OR PAIN  UNUSUAL VAGINAL DISCHARGE OR ITCHING   Items with * indicate a potential emergency and should be followed up as soon as possible or go to the Emergency Department if any problems should occur.   Atezolizumab injection What is this medication? ATEZOLIZUMAB (a te zoe LIZ ue mab)  is a monoclonal antibody. It is used to treat bladder cancer (urothelial cancer), liver cancer, lung cancer, andmelanoma. This medicine may be used for other purposes; ask your health care provider orpharmacist if you have questions. COMMON BRAND NAME(S): Tecentriq What should I tell my care team before I take this medication? They need to know if you have any of these conditions: autoimmune diseases like Crohn's disease, ulcerative colitis, or lupus have had or planning to have an allogeneic stem cell transplant (uses someone else's stem cells) history of organ transplant history of radiation to the chest nervous system problems like myasthenia gravis or Guillain-Barre syndrome an unusual or allergic reaction to atezolizumab, other medicines, foods, dyes, or preservatives pregnant or trying to get pregnant breast-feeding How should I use this medication? This medicine is for infusion into a vein. It is given by a health careprofessional in a hospital or clinic setting. A special MedGuide will be given to you before each treatment. Be sure to readthis information carefully each time. Talk to your pediatrician regarding the use of this medicine in children.Special care may be needed. Overdosage: If you think you have taken too much of this medicine contact apoison control center or emergency room at once. NOTE: This medicine is only for you. Do not share this medicine with others. What if I miss a dose? It is important not to miss your dose. Call your doctor or health careprofessional if you are unable to keep an appointment. What may interact with this medication? Interactions have not been studied. This list may not describe all possible interactions. Give your health care provider a list of all the  medicines, herbs, non-prescription drugs, or dietary supplements you use. Also tell them if you smoke, drink alcohol, or use illegaldrugs. Some items may interact with your medicine. What should  I watch for while using this medication? Your condition will be monitored carefully while you are receiving thismedicine. You may need blood work done while you are taking this medicine. Do not become pregnant while taking this medicine or for at least 5 months after stopping it. Women should inform their doctor if they wish to become pregnant or think they might be pregnant. There is a potential for serious side effects to an unborn child. Talk to your health care professional or pharmacist for more information. Do not breast-feed an infant while taking this medicineor for at least 5 months after the last dose. What side effects may I notice from receiving this medication? Side effects that you should report to your doctor or health care professionalas soon as possible: allergic reactions like skin rash, itching or hives, swelling of the face, lips, or tongue black, tarry stools bloody or watery diarrhea breathing problems changes in vision chest pain or chest tightness chills facial flushing fever headache signs and symptoms of high blood sugar such as dizziness; dry mouth; dry skin; fruity breath; nausea; stomach pain; increased hunger or thirst; increased urination signs and symptoms of liver injury like dark yellow or brown urine; general ill feeling or flu-like symptoms; light-colored stools; loss of appetite; nausea; right upper belly pain; unusually weak or tired; yellowing of the eyes or skin stomach pain trouble passing urine or change in the amount of urine Side effects that usually do not require medical attention (report to yourdoctor or health care professional if they continue or are bothersome): bone pain cough diarrhea joint pain muscle pain muscle weakness swelling of arms or legs tiredness weight loss This list may not describe all possible side effects. Call your doctor for medical advice about side effects. You may report side effects to FDA at1-800-FDA-1088. Where  should I keep my medication? This drug is given in a hospital or clinic and will not be stored at home. NOTE: This sheet is a summary. It may not cover all possible information. If you have questions about this medicine, talk to your doctor, pharmacist, orhealth care provider.  2022 Elsevier/Gold Standard (2020-05-08 13:59:34)    Please show the CHEMOTHERAPY ALERT CARD or IMMUNOTHERAPY ALERT CARD at check-in to the Emergency Department and triage nurse.  Should you have questions after your visit or need to cancel or reschedule your appointment, please contact [facility]  [phone] and follow the prompts.  Office hours are 8:00 a.m. to 4:30 p.m. Monday - Friday. Please note that voicemails left after 4:00 p.m. may not be returned until the following business day.  We are closed weekends and major holidays. You have access to a nurse at all times for urgent questions. Please call the main number to the clinic [phone] and follow the prompts.  For any non-urgent questions, you may also contact your provider using MyChart. We now offer e-Visits for anyone 10 and older to request care online for non-urgent symptoms. For details visit [email or web address].   Also download the MyChart app! Go to the app store, search "MyChart", open the app, select [facility], and log in with your MyChart username and password.  Due to Covid, a mask is required upon entering the hospital/clinic. If you do not have a mask, one will be given to you upon arrival. For  doctor visits, patients may have 1 support person aged 42 or older with them. For treatment visits, patients cannot have anyone with them due to current Covid guidelines and our immunocompromised population.

## 2021-04-15 ENCOUNTER — Encounter: Payer: Self-pay | Admitting: Oncology

## 2021-04-20 ENCOUNTER — Encounter: Payer: Self-pay | Admitting: Nurse Practitioner

## 2021-04-20 ENCOUNTER — Telehealth: Payer: Self-pay | Admitting: *Deleted

## 2021-04-20 NOTE — Telephone Encounter (Signed)
Daughter called reporting that patient informed her that she is very hoarse of voice this morning. She is NOT running fever nor does she have a sore throat. Her eating and drinking habits have not changed. She is asking what she needs to do about it and to call patient back at [phone]. Please advise

## 2021-04-20 NOTE — Telephone Encounter (Signed)
Patient informed of doctor response and agrees to get COVID tested

## 2021-04-20 NOTE — Telephone Encounter (Signed)
Conservative management with lozenges and tylenol as needed. Get checked for covid if possible

## 2021-04-21 ENCOUNTER — Telehealth: Payer: Self-pay | Admitting: Nurse Practitioner

## 2021-04-21 ENCOUNTER — Other Ambulatory Visit: Payer: Self-pay | Admitting: *Deleted

## 2021-04-21 DIAGNOSIS — U071 COVID-19: Secondary | ICD-10-CM

## 2021-04-21 MED ORDER — SODIUM CHLORIDE 1 G PO TABS: 1.0000 g | ORAL_TABLET | Freq: Four times a day (QID) | ORAL | 2 refills | Status: DC

## 2021-04-21 MED ORDER — NIRMATRELVIR/RITONAVIR (PAXLOVID)TABLET: 3.0000 | ORAL_TABLET | Freq: Two times a day (BID) | ORAL | 0 refills | Status: AC

## 2021-04-21 NOTE — Telephone Encounter (Signed)
Patient's daughter called on 8/29 at 6:30 pm. Patient has tested positive for covid. She is at risk of severe infection. Recommend starting paxlovid. Risks, benefits, and alternatives reviewed. She is agreeable to proceed. She will start first dose tonight. Labs and medications reviewed. She will hold seroquel while taking seroquel. Prescription sent to [facility]. Will send schedule message to move appointments out by 3 weeks.

## 2021-04-25 ENCOUNTER — Encounter: Payer: Self-pay | Admitting: Oncology

## 2021-04-26 ENCOUNTER — Other Ambulatory Visit: Payer: Self-pay | Admitting: Oncology

## 2021-04-26 MED ORDER — NITROFURANTOIN MONOHYD MACRO 100 MG PO CAPS: 100.0000 mg | ORAL_CAPSULE | Freq: Two times a day (BID) | ORAL | 0 refills | Status: DC

## 2021-04-28 ENCOUNTER — Other Ambulatory Visit: Payer: Self-pay | Admitting: Oncology

## 2021-04-29 ENCOUNTER — Ambulatory Visit: Payer: Medicare Other

## 2021-04-29 ENCOUNTER — Ambulatory Visit: Payer: Medicare Other | Admitting: Hospice and Palliative Medicine

## 2021-04-29 ENCOUNTER — Other Ambulatory Visit: Payer: Medicare Other

## 2021-05-04 ENCOUNTER — Other Ambulatory Visit: Payer: Self-pay | Admitting: *Deleted

## 2021-05-04 ENCOUNTER — Encounter: Payer: Self-pay | Admitting: Oncology

## 2021-05-04 ENCOUNTER — Other Ambulatory Visit: Payer: Self-pay | Admitting: Oncology

## 2021-05-04 ENCOUNTER — Other Ambulatory Visit: Payer: Self-pay

## 2021-05-04 MED ORDER — SODIUM CHLORIDE 1 G PO TABS: 1.0000 g | ORAL_TABLET | Freq: Four times a day (QID) | ORAL | 0 refills | Status: DC

## 2021-05-05 ENCOUNTER — Other Ambulatory Visit: Payer: Medicare Other

## 2021-05-05 ENCOUNTER — Ambulatory Visit: Payer: Medicare Other

## 2021-05-05 ENCOUNTER — Ambulatory Visit: Payer: Medicare Other | Admitting: Oncology

## 2021-05-06 ENCOUNTER — Other Ambulatory Visit: Payer: Self-pay | Admitting: Oncology

## 2021-05-07 ENCOUNTER — Encounter: Payer: Self-pay | Admitting: Oncology

## 2021-05-07 MED ORDER — NYSTATIN 100000 UNIT/ML MT SUSP: OROMUCOSAL | 0 refills | Status: DC

## 2021-05-11 ENCOUNTER — Encounter: Payer: Self-pay | Admitting: Oncology

## 2021-05-12 ENCOUNTER — Inpatient Hospital Stay: Payer: Medicare Other | Attending: Oncology

## 2021-05-12 ENCOUNTER — Inpatient Hospital Stay: Payer: Medicare Other

## 2021-05-12 ENCOUNTER — Encounter: Payer: Self-pay | Admitting: Oncology

## 2021-05-12 ENCOUNTER — Other Ambulatory Visit: Payer: Self-pay

## 2021-05-12 ENCOUNTER — Inpatient Hospital Stay (HOSPITAL_BASED_OUTPATIENT_CLINIC_OR_DEPARTMENT_OTHER): Payer: Medicare Other | Admitting: Oncology

## 2021-05-12 VITALS — BP 144/114 | HR 72 | Temp 98.9°F | Resp 18 | Wt 146.9 lb

## 2021-05-12 VITALS — BP 141/50 | HR 68

## 2021-05-12 DIAGNOSIS — Z87440 Personal history of urinary (tract) infections: Secondary | ICD-10-CM | POA: Insufficient documentation

## 2021-05-12 DIAGNOSIS — C3490 Malignant neoplasm of unspecified part of unspecified bronchus or lung: Secondary | ICD-10-CM

## 2021-05-12 DIAGNOSIS — Z885 Allergy status to narcotic agent status: Secondary | ICD-10-CM | POA: Diagnosis not present

## 2021-05-12 DIAGNOSIS — E871 Hypo-osmolality and hyponatremia: Secondary | ICD-10-CM | POA: Insufficient documentation

## 2021-05-12 DIAGNOSIS — Z8349 Family history of other endocrine, nutritional and metabolic diseases: Secondary | ICD-10-CM | POA: Diagnosis not present

## 2021-05-12 DIAGNOSIS — Z8249 Family history of ischemic heart disease and other diseases of the circulatory system: Secondary | ICD-10-CM | POA: Insufficient documentation

## 2021-05-12 DIAGNOSIS — Z888 Allergy status to other drugs, medicaments and biological substances status: Secondary | ICD-10-CM | POA: Diagnosis not present

## 2021-05-12 DIAGNOSIS — Z8719 Personal history of other diseases of the digestive system: Secondary | ICD-10-CM | POA: Diagnosis not present

## 2021-05-12 DIAGNOSIS — Z8049 Family history of malignant neoplasm of other genital organs: Secondary | ICD-10-CM | POA: Insufficient documentation

## 2021-05-12 DIAGNOSIS — Z880 Allergy status to penicillin: Secondary | ICD-10-CM | POA: Diagnosis not present

## 2021-05-12 DIAGNOSIS — C3432 Malignant neoplasm of lower lobe, left bronchus or lung: Secondary | ICD-10-CM | POA: Insufficient documentation

## 2021-05-12 DIAGNOSIS — C7951 Secondary malignant neoplasm of bone: Secondary | ICD-10-CM

## 2021-05-12 DIAGNOSIS — Z5112 Encounter for antineoplastic immunotherapy: Secondary | ICD-10-CM | POA: Insufficient documentation

## 2021-05-12 DIAGNOSIS — C7931 Secondary malignant neoplasm of brain: Secondary | ICD-10-CM | POA: Insufficient documentation

## 2021-05-12 DIAGNOSIS — D696 Thrombocytopenia, unspecified: Secondary | ICD-10-CM | POA: Insufficient documentation

## 2021-05-12 DIAGNOSIS — J029 Acute pharyngitis, unspecified: Secondary | ICD-10-CM | POA: Insufficient documentation

## 2021-05-12 DIAGNOSIS — R5383 Other fatigue: Secondary | ICD-10-CM | POA: Insufficient documentation

## 2021-05-12 DIAGNOSIS — Z882 Allergy status to sulfonamides status: Secondary | ICD-10-CM | POA: Insufficient documentation

## 2021-05-12 DIAGNOSIS — Z87891 Personal history of nicotine dependence: Secondary | ICD-10-CM | POA: Insufficient documentation

## 2021-05-12 DIAGNOSIS — Z79899 Other long term (current) drug therapy: Secondary | ICD-10-CM | POA: Diagnosis not present

## 2021-05-12 DIAGNOSIS — Z818 Family history of other mental and behavioral disorders: Secondary | ICD-10-CM | POA: Diagnosis not present

## 2021-05-12 DIAGNOSIS — Z881 Allergy status to other antibiotic agents status: Secondary | ICD-10-CM | POA: Diagnosis not present

## 2021-05-12 DIAGNOSIS — Z801 Family history of malignant neoplasm of trachea, bronchus and lung: Secondary | ICD-10-CM | POA: Diagnosis not present

## 2021-05-12 LAB — COMPREHENSIVE METABOLIC PANEL
ALT: 13 U/L (ref 0–44)
AST: 19 U/L (ref 15–41)
Albumin: 3.3 g/dL — ABNORMAL LOW (ref 3.5–5.0)
Alkaline Phosphatase: 51 U/L (ref 38–126)
Anion gap: 8 (ref 5–15)
BUN: 10 mg/dL (ref 8–23)
CO2: 23 mmol/L (ref 22–32)
Calcium: 8.4 mg/dL — ABNORMAL LOW (ref 8.9–10.3)
Chloride: 95 mmol/L — ABNORMAL LOW (ref 98–111)
Creatinine, Ser: 0.8 mg/dL (ref 0.44–1.00)
GFR, Estimated: >60 mL/min (ref >60)
Glucose, Bld: 105 mg/dL — ABNORMAL HIGH (ref 70–99)
Potassium: 3.7 mmol/L (ref 3.5–5.1)
Sodium: 126 mmol/L — ABNORMAL LOW (ref 135–145)
Total Bilirubin: 0.6 mg/dL (ref 0.3–1.2)
Total Protein: 6.1 g/dL — ABNORMAL LOW (ref 6.5–8.1)

## 2021-05-12 LAB — CBC WITH DIFFERENTIAL/PLATELET
Abs Immature Granulocytes: 0.04 10*3/uL (ref 0.00–0.07)
Basophils Absolute: 0 10*3/uL (ref 0.0–0.1)
Basophils Relative: 1 %
Eosinophils Absolute: 0.1 10*3/uL (ref 0.0–0.5)
Eosinophils Relative: 2 %
HCT: 28.6 % — ABNORMAL LOW (ref 36.0–46.0)
Hemoglobin: 9.7 g/dL — ABNORMAL LOW (ref 12.0–15.0)
Immature Granulocytes: 1 %
Lymphocytes Relative: 11 %
Lymphs Abs: 0.6 10*3/uL — ABNORMAL LOW (ref 0.7–4.0)
MCH: 33.3 pg (ref 26.0–34.0)
MCHC: 33.9 g/dL (ref 30.0–36.0)
MCV: 98.3 fL (ref 80.0–100.0)
Monocytes Absolute: 0.5 10*3/uL (ref 0.1–1.0)
Monocytes Relative: 10 %
Neutro Abs: 3.7 10*3/uL (ref 1.7–7.7)
Neutrophils Relative %: 75 %
Platelets: 102 10*3/uL — ABNORMAL LOW (ref 150–400)
RBC: 2.91 MIL/uL — ABNORMAL LOW (ref 3.87–5.11)
RDW: 15.6 % — ABNORMAL HIGH (ref 11.5–15.5)
WBC: 5 10*3/uL (ref 4.0–10.5)
nRBC: 0 % (ref 0.0–0.2)

## 2021-05-12 MED ORDER — SODIUM CHLORIDE 0.9 % IV SOLN
1200.0000 mg | Freq: Once | INTRAVENOUS | Status: AC
  Administered 2021-05-12: 1200 mg via INTRAVENOUS
  Filled 2021-05-12: qty 20

## 2021-05-12 MED ORDER — HEPARIN SOD (PORK) LOCK FLUSH 100 UNIT/ML IV SOLN
500.0000 [IU] | Freq: Once | INTRAVENOUS | Status: AC | PRN
  Filled 2021-05-12: qty 5

## 2021-05-12 MED ORDER — SODIUM CHLORIDE 0.9 % IV SOLN
Freq: Once | INTRAVENOUS | Status: AC
  Filled 2021-05-12: qty 250

## 2021-05-12 MED ORDER — SODIUM CHLORIDE 0.9% FLUSH
10.0000 mL | Freq: Once | INTRAVENOUS | Status: AC
  Administered 2021-05-12: 10 mL via INTRAVENOUS
  Filled 2021-05-12: qty 10

## 2021-05-12 MED ORDER — HEPARIN SOD (PORK) LOCK FLUSH 100 UNIT/ML IV SOLN
INTRAVENOUS | Status: AC
  Administered 2021-05-12: 500 [IU]
  Filled 2021-05-12: qty 5

## 2021-05-12 NOTE — Progress Notes (Signed)
Pt in for follow up, states "daughter is her advocate and has several questions". Pt BP is elevated, states she took medication this morning.  Pt states she finished her Macrobid for UTI.

## 2021-05-12 NOTE — Progress Notes (Signed)
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  Patient completed radiation on 5/31.  Patient receiving tecentriq.  Noted recent covid infection and UTI  Met with patient during infusion.  Patient reports that her appetite is better.  Reports that she has been eating greens, cornbread, mashed potatoes, banana sandwiches, tomato soup.  Has been drinking 3 boost soothe shakes per day (strawberry kiwi).    Medications: reviewed  Labs: Na 126, glucose 105  Anthropometrics:   Weight 146 lb 14.4 oz today  139 lb on 8/9 145 lb on 7/5 140 lb on 6/28 139 lb on 5/31 145 lb on 5/12 144 lb on 3/30   NUTRITION DIAGNOSIS: Inadequate oral intake continues   INTERVENTION:  Continue boost soothe shake at least TID now that solid food intake has increased Continue high calorie, high protein foods    MONITORING, EVALUATION, GOAL: weight trends, intake   NEXT VISIT: Tuesday, Oct 11th during infusion  Keon Benscoter B. Zenia Resides, [place], [place] Registered Dietitian [phone] (mobile)

## 2021-05-12 NOTE — Progress Notes (Signed)
Per Dr .Janese Banks okay to proceed with Sodium 126.

## 2021-05-12 NOTE — Patient Instructions (Signed)
[facility]  Discharge Instructions: Thank you for choosing [facility] to provide your oncology and hematology care.  If you have a lab appointment with the [facility], please go directly to the [facility] and check in at the registration area.  Wear comfortable clothing and clothing appropriate for easy access to any Portacath or PICC line.   We strive to give you quality time with your provider. You may need to reschedule your appointment if you arrive late (15 or more minutes).  Arriving late affects you and other patients whose appointments are after yours.  Also, if you miss three or more appointments without notifying the office, you may be dismissed from the clinic at the provider's discretion.      For prescription refill requests, have your pharmacy contact our office and allow 72 hours for refills to be completed.    Today you received the following chemotherapy and/or immunotherapy agents Tecentriq      To help prevent nausea and vomiting after your treatment, we encourage you to take your nausea medication as directed.  BELOW ARE SYMPTOMS THAT SHOULD BE REPORTED IMMEDIATELY: *FEVER GREATER THAN 100.4 F (38 C) OR HIGHER *CHILLS OR SWEATING *NAUSEA AND VOMITING THAT IS NOT CONTROLLED WITH YOUR NAUSEA MEDICATION *UNUSUAL SHORTNESS OF BREATH *UNUSUAL BRUISING OR BLEEDING *URINARY PROBLEMS (pain or burning when urinating, or frequent urination) *BOWEL PROBLEMS (unusual diarrhea, constipation, pain near the anus) TENDERNESS IN MOUTH AND THROAT WITH OR WITHOUT PRESENCE OF ULCERS (sore throat, sores in mouth, or a toothache) UNUSUAL RASH, SWELLING OR PAIN  UNUSUAL VAGINAL DISCHARGE OR ITCHING   Items with * indicate a potential emergency and should be followed up as soon as possible or go to the Emergency Department if any problems should occur.  Please show the CHEMOTHERAPY ALERT CARD or IMMUNOTHERAPY ALERT CARD at check-in  to the Emergency Department and triage nurse.  Should you have questions after your visit or need to cancel or reschedule your appointment, please contact [facility]  [phone] and follow the prompts.  Office hours are 8:00 a.m. to 4:30 p.m. Monday - Friday. Please note that voicemails left after 4:00 p.m. may not be returned until the following business day.  We are closed weekends and major holidays. You have access to a nurse at all times for urgent questions. Please call the main number to the clinic [phone] and follow the prompts.  For any non-urgent questions, you may also contact your provider using MyChart. We now offer e-Visits for anyone 84 and older to request care online for non-urgent symptoms. For details visit [email or web address].   Also download the MyChart app! Go to the app store, search "MyChart", open the app, select [facility], and log in with your MyChart username and password.  Due to Covid, a mask is required upon entering the hospital/clinic. If you do not have a mask, one will be given to you upon arrival. For doctor visits, patients may have 1 support person aged 27 or older with them. For treatment visits, patients cannot have anyone with them due to current Covid guidelines and our immunocompromised population.

## 2021-05-12 NOTE — Progress Notes (Signed)
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  She underwent CT chest with contrast in March 2022 following an abnormal x-ray which showed a 2.6 cm mass in the left lower lobe.  This was followed by a PET CT scan which showed a 3.0 x 2.0 cm mass in the left lower lobe of the lung with an SUV of 9.7.  No evidence of locoregional adenopathy.  No evidence of intra-abdominal disease with patient was noted to have a hypermetabolic focus in the right iliac bone with an SUV of 11.1 which was suspicious for metastatic disease.  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  Cells were positive for TTF-1, CD56 and chromogranin.  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  Patient completed whole brain radiation treatment and completed carbo etoposide Tecentriq 4 cycles on 03/10/2021.    Interval history-patient's treatment was delayed by 2 weeks after she tested positive for COVID about 3 weeks ago.   Presently she reports doing better.  Mental status is improved.  She also had a sore throat which has improved although not better all the way.  She has had 3-4 episodes of UTI this year.  She had an episode of burning urination about 3 days ago but denies any complaints presently.  ECOG PS- 2 Pain scale- 0   Review of systems- Review of Systems  Constitutional:  Positive for malaise/fatigue. Negative for chills, fever and weight loss.  HENT:  Negative for congestion, ear discharge and nosebleeds.   Eyes:  Negative for blurred vision.  Respiratory:  Negative for cough, hemoptysis, sputum production, shortness of breath and wheezing.   Cardiovascular:  Negative for chest pain, palpitations, orthopnea and claudication.  Gastrointestinal:  Negative for abdominal pain, blood in stool, constipation, diarrhea, heartburn, melena, nausea and vomiting.  Genitourinary:  Negative for dysuria, flank pain, frequency, hematuria and urgency.  Musculoskeletal:  Negative for back pain, joint pain and myalgias.  Skin:  Negative for rash.  Neurological:  Negative for dizziness, tingling, focal weakness, seizures, weakness and headaches.  Endo/Heme/Allergies:  Does not bruise/bleed easily.  Psychiatric/Behavioral:  Negative for depression and suicidal ideas. The patient does not have insomnia.      Allergies  Allergen Reactions   Augmentin [Amoxicillin-Pot Clavulanate]     "messes up my blood cells"   Codeine Nausea Only   Levaquin [Levofloxacin]     Delusions    Sulfa Antibiotics Nausea And Vomiting   Vancomycin Other (See Comments)    [place] syndome- happened in the hospital at [facility]   Tape Rash    Some  bandaids cause rash sometimes     Past Medical History:  Diagnosis Date   Anxiety    Breathing problem    low breathing function 53%   COPD (chronic obstructive pulmonary disease) (HCC)    EMPHYSEMA   Dysrhythmia    tachycardia   Emphysema of lung (HCC)    Hemorrhoid    History of hiatal  hernia    SMALL- 2022   Hypercholesteremia    Hypertension    Hypothyroidism    Larynx polyp    Lung cancer (HCC)    Palpitations    with anxiety   Platelets decreased ([place]) 2020   Seasonal allergies    Tinnitus    Vertigo    no episodes in several years   Wears dentures    partial lower   White coat syndrome with hypertension      Past Surgical History:  Procedure Laterality Date   ABDOMINAL HYSTERECTOMY     partial   COLONOSCOPY WITH PROPOFOL N/A 01/22/2016   Procedure: COLONOSCOPY WITH PROPOFOL;  Surgeon: Lucilla Lame, MD;  Location: [facility];  Service: Endoscopy;  Laterality: N/A;   COLONOSCOPY WITH PROPOFOL N/A 04/12/2019   Procedure: COLONOSCOPY WITH BIOPSY;  Surgeon: Lucilla Lame, MD;  Location: [facility];  Service: Endoscopy;  Laterality: N/A;  pt would like an early appt   ct guided lung bx     IR IMAGING GUIDED PORT INSERTION  12/18/2020   POLYPECTOMY  01/22/2016   Procedure: POLYPECTOMY;  Surgeon: Lucilla Lame, MD;  Location: [facility];  Service: Endoscopy;;   POLYPECTOMY N/A 04/12/2019   Procedure: POLYPECTOMY;  Surgeon: Lucilla Lame, MD;  Location: [facility];  Service: Endoscopy;  Laterality: N/A;   VIDEO BRONCHOSCOPY WITH ENDOBRONCHIAL NAVIGATION N/A 11/21/2020   Procedure: VIDEO BRONCHOSCOPY WITH ENDOBRONCHIAL NAVIGATION;  Surgeon: Ottie Glazier, MD;  Location: [facility];  Service: Thoracic;  Laterality: N/A;   VIDEO BRONCHOSCOPY WITH ENDOBRONCHIAL ULTRASOUND N/A 11/21/2020   Procedure: VIDEO BRONCHOSCOPY WITH ENDOBRONCHIAL ULTRASOUND;  Surgeon: Ottie Glazier, MD;  Location: [facility];  Service: Thoracic;  Laterality: N/A;    Social History   Socioeconomic History   Marital status: Married    Spouse name: Not on file   Number of children: Not on file   Years of education: Not on file   Highest education level: Not on file  Occupational History   Not on file  Tobacco Use   Smoking status: Former    Packs/day: 0.50     Years: 50.00    Pack years: 25.00    Types: Cigarettes    Quit date: 11/03/2020    Years since quitting: 0.5   Smokeless tobacco: Never  Vaping Use   Vaping Use: Some days  Substance and Sexual Activity   Alcohol use: No   Drug use: Never   Sexual activity: Not on file  Other Topics Concern   Not on file  Social History Narrative   Not on file   Social Determinants of Health   Financial Resource Strain: Not on file  Food Insecurity: Not on file  Transportation Needs: Not on file  Physical Activity: Not on file  Stress: Not on file  Social Connections: Not on file  Intimate Partner Violence: Not on file    Family History  Problem Relation Age of Onset   Alzheimer's disease Mother    Heart attack Mother    High Cholesterol Mother    Hypertension Mother    Heart block Mother  Lung cancer Father    Uterine cancer Sister    Hypertension Sister    Heart block Sister    Anxiety disorder Sister    Breast cancer Neg Hx      Current Outpatient Medications:    acetaminophen (TYLENOL) 500 MG tablet, Take 500 mg by mouth every 6 (six) hours as needed for mild pain or moderate pain., Disp: , Rfl:    atorvastatin (LIPITOR) 10 MG tablet, TAKE 1 TABLET BY MOUTH DAILY, Disp: 90 tablet, Rfl: 0   Fluticasone-Umeclidin-Vilant (TRELEGY ELLIPTA) 100-62.5-25 MCG/INH AEPB, Inhale 1 puff into the lungs daily., Disp: 1 each, Rfl: 2   ketotifen (ZADITOR) 0.025 % ophthalmic solution, Place 1 drop into both eyes 2 (two) times daily as needed., Disp: , Rfl:    levothyroxine (SYNTHROID, LEVOTHROID) 50 MCG tablet, Take 50 mcg by mouth daily before breakfast., Disp: , Rfl:    loratadine (CLARITIN) 10 MG tablet, Take 10 mg by mouth daily. Reported on 01/22/2016, Disp: , Rfl:    LORazepam (ATIVAN) 0.5 MG tablet, Take 1 tablet (0.5 mg total) by mouth every 6 (six) hours as needed for anxiety., Disp: 120 tablet, Rfl: 0   losartan-hydrochlorothiazide (HYZAAR) 100-12.5 MG tablet, , Disp: , Rfl:     magnesium chloride (SLOW-MAG) 64 MG TBEC SR tablet, Take 1 tablet (64 mg total) by mouth daily., Disp: 30 tablet, Rfl: 0   metoprolol succinate (TOPROL-XL) 50 MG 24 hr tablet, Take 50 mg by mouth daily., Disp: , Rfl:    nystatin (MYCOSTATIN) 100000 UNIT/ML suspension, SHAKE LIQUID AND TAKE 5 ML BY MOUTH FOUR TIMES DAILY, Disp: 60 mL, Rfl: 0   ondansetron (ZOFRAN) 8 MG tablet, TAKE 1 TABLET BY MOUTH TWICE DAILY AS NEEDED FOR REFRACTORY NAUSEA/ VOMITING. START ON DAY 3 AFTER CARBOPLATIN CHEMO, Disp: 30 tablet, Rfl: 3   Phenazopyridine HCl (AZO URINARY PAIN PO), Take by mouth., Disp: , Rfl:    potassium chloride SA (KLOR-CON) 20 MEQ tablet, TAKE 1 TABLET(20 MEQ) BY MOUTH DAILY, Disp: 28 tablet, Rfl: 0   prochlorperazine (COMPAZINE) 10 MG tablet, Take 10 mg by mouth as needed for nausea or vomiting., Disp: , Rfl:    QUEtiapine (SEROQUEL) 25 MG tablet, Take 1 tablet (25 mg total) by mouth at bedtime., Disp: 30 tablet, Rfl: 2   sodium chloride 1 g tablet, Take 1 tablet (1 g total) by mouth 4 (four) times daily., Disp: 120 tablet, Rfl: 0   loperamide (IMODIUM) 2 MG capsule, Take 2 mg by mouth as needed for diarrhea or loose stools. (Patient not taking: Reported on 05/12/2021), Disp: , Rfl:  No current facility-administered medications for this visit.  Facility-Administered Medications Ordered in Other Visits:    0.9 %  sodium chloride infusion, , Intravenous, Once, Earlie Server, MD  Physical exam:  Vitals:   05/12/21 0927  BP: (!) 144/114  Pulse: 72  Resp: 18  Temp: 98.9 F (37.2 C)  TempSrc: Tympanic  SpO2: 100%  Weight: 146 lb 14.4 oz (66.6 kg)   Physical Exam Constitutional:      Comments: Sitting in a wheelchair.  Appears in no acute distress  Cardiovascular:     Rate and Rhythm: Normal rate and regular rhythm.     Heart sounds: Normal heart sounds.  Pulmonary:     Effort: Pulmonary effort is normal.     Breath sounds: Normal breath sounds.  Abdominal:     General: Bowel sounds are  normal.     Palpations: Abdomen is soft.  Skin:  General: Skin is warm and dry.  Neurological:     Mental Status: She is alert and oriented to person, place, and time.     CMP Latest Ref Rng & Units 05/12/2021  Glucose 70 - 99 mg/dL 105(H)  BUN 8 - 23 mg/dL 10  Creatinine 0.44 - 1.00 mg/dL 0.80  Sodium 135 - 145 mmol/L 126(L)  Potassium 3.5 - 5.1 mmol/L 3.7  Chloride 98 - 111 mmol/L 95(L)  CO2 22 - 32 mmol/L 23  Calcium 8.9 - 10.3 mg/dL 8.4(L)  Total Protein 6.5 - 8.1 g/dL 6.1(L)  Total Bilirubin 0.3 - 1.2 mg/dL 0.6  Alkaline Phos 38 - 126 U/L 51  AST 15 - 41 U/L 19  ALT 0 - 44 U/L 13   CBC Latest Ref Rng & Units 05/12/2021  WBC 4.0 - 10.5 K/uL 5.0  Hemoglobin 12.0 - 15.0 g/dL 9.7(L)  Hematocrit 36.0 - 46.0 % 28.6(L)  Platelets 150 - 400 K/uL 102(L)     Assessment and plan- Patient is a 75 y.o. 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  Scan showed stable disease and she is here for on treatment assessment prior to cycle 2 of palliative Tecentriq  Patient recently recovered from [place] about 3 weeks ago.  Today she has some thrombocytopenia which is unclear if related to prior treatments versus recent COVID infection.  Continue to monitor.  Counts otherwise okay to proceed with cycle 2 of palliative Tecentriq today and I will see her back in 3 weeks for cycle 3.  Plan is to repeat scans after 4 cycles.  Patient had baseline brain metastases and underwent whole brain radiation treatment will repeat MRI brain with and without contrast at this time.  Recurrent UTIs: Today she reports no symptoms and I will therefore hold off on checking a urinalysis at this time and refer her to urology for discussing long-term management of recurrent UTIs.  Bone metastases: She did receiveZometa in the past but her calcium levels are 8.4 today and therefore we will hold off on giving her Zometa today.   Potentially consider it in 3 weeks time.  Sore throat: Clinically improving.  She states ENT was planning to put her on oral steroids which we can hold off at this time especially since she is on Tecentriq   Visit Diagnosis 1. Lung cancer metastatic to brain ([place])   2. Encounter for antineoplastic immunotherapy   3. Hyponatremia   4. Bone metastases ([place])      Dr. Randa Evens, MD, MPH [facility] at [facility] [phone] 05/12/2021 1:10 PM

## 2021-05-14 ENCOUNTER — Encounter: Payer: Self-pay | Admitting: Oncology

## 2021-05-19 ENCOUNTER — Encounter: Payer: Self-pay | Admitting: Oncology

## 2021-05-26 ENCOUNTER — Ambulatory Visit
Admission: RE | Admit: 2021-05-26 | Discharge: 2021-05-26 | Disposition: A | Discharge status: Home / Self Care | Payer: Medicare Other | Source: Ambulatory Visit | Attending: Oncology | Admitting: Oncology

## 2021-05-26 ENCOUNTER — Other Ambulatory Visit: Payer: Self-pay

## 2021-05-26 DIAGNOSIS — C7931 Secondary malignant neoplasm of brain: Secondary | ICD-10-CM | POA: Insufficient documentation

## 2021-05-26 DIAGNOSIS — C3490 Malignant neoplasm of unspecified part of unspecified bronchus or lung: Secondary | ICD-10-CM | POA: Insufficient documentation

## 2021-05-26 MED ORDER — GADOBUTROL 1 MMOL/ML IV SOLN
6.0000 mL | Freq: Once | INTRAVENOUS | Status: AC | PRN
  Administered 2021-05-26: 6 mL via INTRAVENOUS

## 2021-05-27 ENCOUNTER — Telehealth: Payer: Self-pay | Admitting: Oncology

## 2021-05-27 NOTE — Telephone Encounter (Signed)
Trina Ao called and wants to talk to Dr or nurse for an explanation of test results on Brain Scan.

## 2021-05-28 ENCOUNTER — Telehealth: Payer: Self-pay | Admitting: *Deleted

## 2021-05-28 NOTE — Telephone Encounter (Signed)
-----   Message from Secundino Ginger sent at 05/28/2021  1:18 PM EDT ----- Regarding: Brain scan Beth Mcmillan called and wants someone to call her with the brain Scan results. She got the answering service so I am relaying this msg.

## 2021-05-28 NOTE — Telephone Encounter (Signed)
Called daughter and let her know that dr Janese Banks said that it looks like she had good response to the radiation and the other things talked about was probably result of radiation . From her standpoint it was a good scan

## 2021-06-01 ENCOUNTER — Other Ambulatory Visit: Payer: Self-pay | Admitting: *Deleted

## 2021-06-01 MED ORDER — QUETIAPINE FUMARATE 25 MG PO TABS: 25.0000 mg | ORAL_TABLET | Freq: Every day | ORAL | 2 refills | Status: AC

## 2021-06-02 ENCOUNTER — Inpatient Hospital Stay: Payer: Medicare Other

## 2021-06-02 ENCOUNTER — Inpatient Hospital Stay: Payer: Medicare Other | Attending: Oncology

## 2021-06-02 ENCOUNTER — Inpatient Hospital Stay (HOSPITAL_BASED_OUTPATIENT_CLINIC_OR_DEPARTMENT_OTHER): Payer: Medicare Other | Admitting: Oncology

## 2021-06-02 ENCOUNTER — Encounter: Payer: Self-pay | Admitting: Oncology

## 2021-06-02 VITALS — BP 136/62 | HR 76 | Temp 99.4°F | Resp 18 | Wt 149.4 lb

## 2021-06-02 DIAGNOSIS — Z79899 Other long term (current) drug therapy: Secondary | ICD-10-CM | POA: Diagnosis not present

## 2021-06-02 DIAGNOSIS — E871 Hypo-osmolality and hyponatremia: Secondary | ICD-10-CM

## 2021-06-02 DIAGNOSIS — Z801 Family history of malignant neoplasm of trachea, bronchus and lung: Secondary | ICD-10-CM | POA: Insufficient documentation

## 2021-06-02 DIAGNOSIS — D8989 Other specified disorders involving the immune mechanism, not elsewhere classified: Secondary | ICD-10-CM | POA: Diagnosis not present

## 2021-06-02 DIAGNOSIS — Z923 Personal history of irradiation: Secondary | ICD-10-CM | POA: Diagnosis not present

## 2021-06-02 DIAGNOSIS — Z885 Allergy status to narcotic agent status: Secondary | ICD-10-CM | POA: Insufficient documentation

## 2021-06-02 DIAGNOSIS — C7951 Secondary malignant neoplasm of bone: Secondary | ICD-10-CM | POA: Diagnosis not present

## 2021-06-02 DIAGNOSIS — Z880 Allergy status to penicillin: Secondary | ICD-10-CM | POA: Insufficient documentation

## 2021-06-02 DIAGNOSIS — Z888 Allergy status to other drugs, medicaments and biological substances status: Secondary | ICD-10-CM | POA: Diagnosis not present

## 2021-06-02 DIAGNOSIS — Z5112 Encounter for antineoplastic immunotherapy: Secondary | ICD-10-CM | POA: Insufficient documentation

## 2021-06-02 DIAGNOSIS — Z8349 Family history of other endocrine, nutritional and metabolic diseases: Secondary | ICD-10-CM | POA: Diagnosis not present

## 2021-06-02 DIAGNOSIS — C7931 Secondary malignant neoplasm of brain: Secondary | ICD-10-CM | POA: Diagnosis not present

## 2021-06-02 DIAGNOSIS — C3490 Malignant neoplasm of unspecified part of unspecified bronchus or lung: Secondary | ICD-10-CM | POA: Diagnosis not present

## 2021-06-02 DIAGNOSIS — Z8249 Family history of ischemic heart disease and other diseases of the circulatory system: Secondary | ICD-10-CM | POA: Insufficient documentation

## 2021-06-02 DIAGNOSIS — Z818 Family history of other mental and behavioral disorders: Secondary | ICD-10-CM | POA: Insufficient documentation

## 2021-06-02 DIAGNOSIS — Z8719 Personal history of other diseases of the digestive system: Secondary | ICD-10-CM | POA: Diagnosis not present

## 2021-06-02 DIAGNOSIS — Z881 Allergy status to other antibiotic agents status: Secondary | ICD-10-CM | POA: Diagnosis not present

## 2021-06-02 DIAGNOSIS — Z882 Allergy status to sulfonamides status: Secondary | ICD-10-CM | POA: Diagnosis not present

## 2021-06-02 DIAGNOSIS — Z87891 Personal history of nicotine dependence: Secondary | ICD-10-CM | POA: Insufficient documentation

## 2021-06-02 DIAGNOSIS — C3432 Malignant neoplasm of lower lobe, left bronchus or lung: Secondary | ICD-10-CM | POA: Insufficient documentation

## 2021-06-02 DIAGNOSIS — Z8049 Family history of malignant neoplasm of other genital organs: Secondary | ICD-10-CM | POA: Insufficient documentation

## 2021-06-02 DIAGNOSIS — R5383 Other fatigue: Secondary | ICD-10-CM | POA: Insufficient documentation

## 2021-06-02 LAB — CBC WITH DIFFERENTIAL/PLATELET
Abs Immature Granulocytes: 0.09 10*3/uL — ABNORMAL HIGH (ref 0.00–0.07)
Basophils Absolute: 0 10*3/uL (ref 0.0–0.1)
Basophils Relative: 1 %
Eosinophils Absolute: 0.1 10*3/uL (ref 0.0–0.5)
Eosinophils Relative: 1 %
HCT: 29.9 % — ABNORMAL LOW (ref 36.0–46.0)
Hemoglobin: 10.5 g/dL — ABNORMAL LOW (ref 12.0–15.0)
Immature Granulocytes: 2 %
Lymphocytes Relative: 12 %
Lymphs Abs: 0.6 10*3/uL — ABNORMAL LOW (ref 0.7–4.0)
MCH: 33 pg (ref 26.0–34.0)
MCHC: 35.1 g/dL (ref 30.0–36.0)
MCV: 94 fL (ref 80.0–100.0)
Monocytes Absolute: 0.8 10*3/uL (ref 0.1–1.0)
Monocytes Relative: 15 %
Neutro Abs: 3.7 10*3/uL (ref 1.7–7.7)
Neutrophils Relative %: 69 %
Platelets: 195 10*3/uL (ref 150–400)
RBC: 3.18 MIL/uL — ABNORMAL LOW (ref 3.87–5.11)
RDW: 14.6 % (ref 11.5–15.5)
WBC: 5.3 10*3/uL (ref 4.0–10.5)
nRBC: 0 % (ref 0.0–0.2)

## 2021-06-02 LAB — COMPREHENSIVE METABOLIC PANEL
ALT: 17 U/L (ref 0–44)
AST: 20 U/L (ref 15–41)
Albumin: 3.5 g/dL (ref 3.5–5.0)
Alkaline Phosphatase: 61 U/L (ref 38–126)
Anion gap: 8 (ref 5–15)
BUN: 14 mg/dL (ref 8–23)
CO2: 24 mmol/L (ref 22–32)
Calcium: 8.7 mg/dL — ABNORMAL LOW (ref 8.9–10.3)
Chloride: 91 mmol/L — ABNORMAL LOW (ref 98–111)
Creatinine, Ser: 0.76 mg/dL (ref 0.44–1.00)
GFR, Estimated: >60 mL/min (ref >60)
Glucose, Bld: 101 mg/dL — ABNORMAL HIGH (ref 70–99)
Potassium: 3.5 mmol/L (ref 3.5–5.1)
Sodium: 123 mmol/L — ABNORMAL LOW (ref 135–145)
Total Bilirubin: 0.4 mg/dL (ref 0.3–1.2)
Total Protein: 6.4 g/dL — ABNORMAL LOW (ref 6.5–8.1)

## 2021-06-02 MED ORDER — SODIUM CHLORIDE 0.9 % IV SOLN
Freq: Once | INTRAVENOUS | Status: AC
  Filled 2021-06-02: qty 250

## 2021-06-02 MED ORDER — SODIUM CHLORIDE 0.9 % IV SOLN
1200.0000 mg | Freq: Once | INTRAVENOUS | Status: AC
  Administered 2021-06-02: 1200 mg via INTRAVENOUS
  Filled 2021-06-02: qty 20

## 2021-06-02 MED ORDER — HEPARIN SOD (PORK) LOCK FLUSH 100 UNIT/ML IV SOLN
INTRAVENOUS | Status: AC
  Administered 2021-06-02: 500 [IU]
  Filled 2021-06-02: qty 5

## 2021-06-02 MED ORDER — SODIUM CHLORIDE 0.9% FLUSH
10.0000 mL | Freq: Once | INTRAVENOUS | Status: AC
  Administered 2021-06-02: 10 mL via INTRAVENOUS
  Filled 2021-06-02: qty 10

## 2021-06-02 NOTE — Patient Instructions (Signed)
[facility]  Discharge Instructions: Thank you for choosing [facility] to provide your oncology and hematology care.  If you have a lab appointment with the [facility], please go directly to the [facility] and check in at the registration area.  Wear comfortable clothing and clothing appropriate for easy access to any Portacath or PICC line.   We strive to give you quality time with your provider. You may need to reschedule your appointment if you arrive late (15 or more minutes).  Arriving late affects you and other patients whose appointments are after yours.  Also, if you miss three or more appointments without notifying the office, you may be dismissed from the clinic at the provider's discretion.      For prescription refill requests, have your pharmacy contact our office and allow 72 hours for refills to be completed.    Today you received the following chemotherapy and/or immunotherapy agents TECENTRIQ      To help prevent nausea and vomiting after your treatment, we encourage you to take your nausea medication as directed.  BELOW ARE SYMPTOMS THAT SHOULD BE REPORTED IMMEDIATELY: *FEVER GREATER THAN 100.4 F (38 C) OR HIGHER *CHILLS OR SWEATING *NAUSEA AND VOMITING THAT IS NOT CONTROLLED WITH YOUR NAUSEA MEDICATION *UNUSUAL SHORTNESS OF BREATH *UNUSUAL BRUISING OR BLEEDING *URINARY PROBLEMS (pain or burning when urinating, or frequent urination) *BOWEL PROBLEMS (unusual diarrhea, constipation, pain near the anus) TENDERNESS IN MOUTH AND THROAT WITH OR WITHOUT PRESENCE OF ULCERS (sore throat, sores in mouth, or a toothache) UNUSUAL RASH, SWELLING OR PAIN  UNUSUAL VAGINAL DISCHARGE OR ITCHING   Items with * indicate a potential emergency and should be followed up as soon as possible or go to the Emergency Department if any problems should occur.  Please show the CHEMOTHERAPY ALERT CARD or IMMUNOTHERAPY ALERT CARD at check-in  to the Emergency Department and triage nurse.  Should you have questions after your visit or need to cancel or reschedule your appointment, please contact [facility]  [phone] and follow the prompts.  Office hours are 8:00 a.m. to 4:30 p.m. Monday - Friday. Please note that voicemails left after 4:00 p.m. may not be returned until the following business day.  We are closed weekends and major holidays. You have access to a nurse at all times for urgent questions. Please call the main number to the clinic [phone] and follow the prompts.  For any non-urgent questions, you may also contact your provider using MyChart. We now offer e-Visits for anyone 51 and older to request care online for non-urgent symptoms. For details visit [email or web address].   Also download the MyChart app! Go to the app store, search "MyChart", open the app, select [facility], and log in with your MyChart username and password.  Due to Covid, a mask is required upon entering the hospital/clinic. If you do not have a mask, one will be given to you upon arrival. For doctor visits, patients may have 1 support person aged 59 or older with them. For treatment visits, patients cannot have anyone with them due to current Covid guidelines and our immunocompromised population.   Atezolizumab injection What is this medication? ATEZOLIZUMAB (a te zoe LIZ ue mab) is a monoclonal antibody. It is used to treat bladder cancer (urothelial cancer), liver cancer, lung cancer, and melanoma. This medicine may be used for other purposes; ask your health care provider or pharmacist if you have questions. COMMON BRAND NAME(S): [employer]  What should I tell my care team before I take this medication? They need to know if you have any of these conditions: autoimmune diseases like Crohn's disease, ulcerative colitis, or lupus have had or planning to have an allogeneic stem cell transplant (uses someone  else's stem cells) history of organ transplant history of radiation to the chest nervous system problems like myasthenia gravis or Guillain-Barre syndrome an unusual or allergic reaction to atezolizumab, other medicines, foods, dyes, or preservatives pregnant or trying to get pregnant breast-feeding How should I use this medication? This medicine is for infusion into a vein. It is given by a health care professional in a hospital or clinic setting. A special MedGuide will be given to you before each treatment. Be sure to read this information carefully each time. Talk to your pediatrician regarding the use of this medicine in children. Special care may be needed. Overdosage: If you think you have taken too much of this medicine contact a poison control center or emergency room at once. NOTE: This medicine is only for you. Do not share this medicine with others. What if I miss a dose? It is important not to miss your dose. Call your doctor or health care professional if you are unable to keep an appointment. What may interact with this medication? Interactions have not been studied. This list may not describe all possible interactions. Give your health care provider a list of all the medicines, herbs, non-prescription drugs, or dietary supplements you use. Also tell them if you smoke, drink alcohol, or use illegal drugs. Some items may interact with your medicine. What should I watch for while using this medication? Your condition will be monitored carefully while you are receiving this medicine. You may need blood work done while you are taking this medicine. Do not become pregnant while taking this medicine or for at least 5 months after stopping it. Women should inform their doctor if they wish to become pregnant or think they might be pregnant. There is a potential for serious side effects to an unborn child. Talk to your health care professional or pharmacist for more information. Do not  breast-feed an infant while taking this medicine or for at least 5 months after the last dose. What side effects may I notice from receiving this medication? Side effects that you should report to your doctor or health care professional as soon as possible: allergic reactions like skin rash, itching or hives, swelling of the face, lips, or tongue black, tarry stools bloody or watery diarrhea breathing problems changes in vision chest pain or chest tightness chills facial flushing fever headache signs and symptoms of high blood sugar such as dizziness; dry mouth; dry skin; fruity breath; nausea; stomach pain; increased hunger or thirst; increased urination signs and symptoms of liver injury like dark yellow or brown urine; general ill feeling or flu-like symptoms; light-colored stools; loss of appetite; nausea; right upper belly pain; unusually weak or tired; yellowing of the eyes or skin stomach pain trouble passing urine or change in the amount of urine Side effects that usually do not require medical attention (report to your doctor or health care professional if they continue or are bothersome): bone pain cough diarrhea joint pain muscle pain muscle weakness swelling of arms or legs tiredness weight loss This list may not describe all possible side effects. Call your doctor for medical advice about side effects. You may report side effects to FDA at 1-800-FDA-1088. Where should I keep my medication? This drug  is given in a hospital or clinic and will not be stored at home. NOTE: This sheet is a summary. It may not cover all possible information. If you have questions about this medicine, talk to your doctor, pharmacist, or health care provider.  2022 Elsevier/Gold Standard (2020-05-08 13:59:34)

## 2021-06-02 NOTE — Progress Notes (Signed)
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  She underwent CT chest with contrast in March 2022 following an abnormal x-ray which showed a 2.6 cm mass in the left lower lobe.  This was followed by a PET CT scan which showed a 3.0 x 2.0 cm mass in the left lower lobe of the lung with an SUV of 9.7.  No evidence of locoregional adenopathy.  No evidence of intra-abdominal disease with patient was noted to have a hypermetabolic focus in the right iliac bone with an SUV of 11.1 which was suspicious for metastatic disease.  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  Cells were positive for TTF-1, CD56 and chromogranin.  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  Patient completed whole brain radiation treatment and completed carbo etoposide Tecentriq 4 cycles on 03/10/2021.  Interval history-patient is trying to eat better and tried to move around a little more.  She still has ongoing  fatigue.  Reports weakness in her bilateral legs.  Patient's daughter reports hearing has gone down especially since radiation treatment  ECOG PS- 2 Pain scale- 0   Review of systems- Review of Systems  Constitutional:  Positive for malaise/fatigue. Negative for chills, fever and weight loss.       Generalized weakness  HENT:  Negative for congestion, ear discharge and nosebleeds.   Eyes:  Negative for blurred vision.  Respiratory:  Negative for cough, hemoptysis, sputum production, shortness of breath and wheezing.   Cardiovascular:  Negative for chest pain, palpitations, orthopnea and claudication.  Gastrointestinal:  Negative for abdominal pain, blood in stool, constipation, diarrhea, heartburn, melena, nausea and vomiting.  Genitourinary:  Negative for dysuria, flank pain, frequency, hematuria and urgency.  Musculoskeletal:  Negative for back pain, joint pain and myalgias.  Skin:  Negative for rash.  Neurological:  Negative for dizziness, tingling, focal weakness, seizures, weakness and headaches.  Endo/Heme/Allergies:  Does not bruise/bleed easily.  Psychiatric/Behavioral:  Negative for depression and suicidal ideas. The patient does not have insomnia.      Allergies  Allergen Reactions   Augmentin [Amoxicillin-Pot Clavulanate]     "messes up my blood cells"   Codeine Nausea Only   Levaquin [Levofloxacin]     Delusions    Sulfa Antibiotics Nausea And Vomiting   Vancomycin Other (See Comments)    [place] syndome- happened in the hospital at [facility]   Tape Rash    Some bandaids cause rash sometimes     Past Medical History:  Diagnosis Date   Anxiety    Breathing problem  low breathing function 53%   COPD (chronic obstructive pulmonary disease) (HCC)    EMPHYSEMA   Dysrhythmia    tachycardia   Emphysema of lung (HCC)    Hemorrhoid    History of hiatal hernia    SMALL- 2022   Hypercholesteremia    Hypertension    Hypothyroidism    Larynx polyp    Lung cancer (HCC)     Palpitations    with anxiety   Platelets decreased ([place]) 2020   Seasonal allergies    Tinnitus    Vertigo    no episodes in several years   Wears dentures    partial lower   White coat syndrome with hypertension      Past Surgical History:  Procedure Laterality Date   ABDOMINAL HYSTERECTOMY     partial   COLONOSCOPY WITH PROPOFOL N/A 01/22/2016   Procedure: COLONOSCOPY WITH PROPOFOL;  Surgeon: Lucilla Lame, MD;  Location: [place];  Service: Endoscopy;  Laterality: N/A;   COLONOSCOPY WITH PROPOFOL N/A 04/12/2019   Procedure: COLONOSCOPY WITH BIOPSY;  Surgeon: Lucilla Lame, MD;  Location: [place];  Service: Endoscopy;  Laterality: N/A;  pt would like an early appt   ct guided lung bx     IR IMAGING GUIDED PORT INSERTION  12/18/2020   POLYPECTOMY  01/22/2016   Procedure: POLYPECTOMY;  Surgeon: Lucilla Lame, MD;  Location: [place];  Service: Endoscopy;;   POLYPECTOMY N/A 04/12/2019   Procedure: POLYPECTOMY;  Surgeon: Lucilla Lame, MD;  Location: [place];  Service: Endoscopy;  Laterality: N/A;   VIDEO BRONCHOSCOPY WITH ENDOBRONCHIAL NAVIGATION N/A 11/21/2020   Procedure: VIDEO BRONCHOSCOPY WITH ENDOBRONCHIAL NAVIGATION;  Surgeon: Ottie Glazier, MD;  Location: [facility];  Service: Thoracic;  Laterality: N/A;   VIDEO BRONCHOSCOPY WITH ENDOBRONCHIAL ULTRASOUND N/A 11/21/2020   Procedure: VIDEO BRONCHOSCOPY WITH ENDOBRONCHIAL ULTRASOUND;  Surgeon: Ottie Glazier, MD;  Location: [facility];  Service: Thoracic;  Laterality: N/A;    Social History   Socioeconomic History   Marital status: Married    Spouse name: Not on file   Number of children: Not on file   Years of education: Not on file   Highest education level: Not on file  Occupational History   Not on file  Tobacco Use   Smoking status: Former    Packs/day: 0.50    Years: 50.00    Pack years: 25.00    Types: Cigarettes    Quit date: 11/03/2020    Years since quitting: 0.5    Smokeless tobacco: Never  Vaping Use   Vaping Use: Some days  Substance and Sexual Activity   Alcohol use: No   Drug use: Never   Sexual activity: Not on file  Other Topics Concern   Not on file  Social History Narrative   Not on file   Social Determinants of Health   Financial Resource Strain: Not on file  Food Insecurity: Not on file  Transportation Needs: Not on file  Physical Activity: Not on file  Stress: Not on file  Social Connections: Not on file  Intimate Partner Violence: Not on file    Family History  Problem Relation Age of Onset   Alzheimer's disease Mother    Heart attack Mother    High Cholesterol Mother    Hypertension Mother    Heart block Mother    Lung cancer Father    Uterine cancer Sister    Hypertension Sister    Heart block Sister    Anxiety  disorder Sister    Breast cancer Neg Hx      Current Outpatient Medications:    acetaminophen (TYLENOL) 500 MG tablet, Take 500 mg by mouth every 6 (six) hours as needed for mild pain or moderate pain., Disp: , Rfl:    atorvastatin (LIPITOR) 10 MG tablet, TAKE 1 TABLET BY MOUTH DAILY, Disp: 90 tablet, Rfl: 0   Fluticasone-Umeclidin-Vilant (TRELEGY ELLIPTA) 100-62.5-25 MCG/INH AEPB, Inhale 1 puff into the lungs daily., Disp: 1 each, Rfl: 2   ketotifen (ZADITOR) 0.025 % ophthalmic solution, Place 1 drop into both eyes 2 (two) times daily as needed., Disp: , Rfl:    levothyroxine (SYNTHROID, LEVOTHROID) 50 MCG tablet, Take 50 mcg by mouth daily before breakfast., Disp: , Rfl:    loratadine (CLARITIN) 10 MG tablet, Take 10 mg by mouth daily. Reported on 01/22/2016, Disp: , Rfl:    losartan-hydrochlorothiazide (HYZAAR) 100-12.5 MG tablet, , Disp: , Rfl:    magnesium chloride (SLOW-MAG) 64 MG TBEC SR tablet, Take 1 tablet (64 mg total) by mouth daily., Disp: 30 tablet, Rfl: 0   metoprolol succinate (TOPROL-XL) 50 MG 24 hr tablet, Take 50 mg by mouth daily., Disp: , Rfl:    nystatin (MYCOSTATIN) 100000 UNIT/ML  suspension, SHAKE LIQUID AND TAKE 5 ML BY MOUTH FOUR TIMES DAILY, Disp: 60 mL, Rfl: 0   ondansetron (ZOFRAN) 8 MG tablet, TAKE 1 TABLET BY MOUTH TWICE DAILY AS NEEDED FOR REFRACTORY NAUSEA/ VOMITING. START ON DAY 3 AFTER CARBOPLATIN CHEMO, Disp: 30 tablet, Rfl: 3   potassium chloride SA (KLOR-CON) 20 MEQ tablet, TAKE 1 TABLET(20 MEQ) BY MOUTH DAILY, Disp: 28 tablet, Rfl: 0   prochlorperazine (COMPAZINE) 10 MG tablet, Take 10 mg by mouth as needed for nausea or vomiting., Disp: , Rfl:    QUEtiapine (SEROQUEL) 25 MG tablet, Take 1 tablet (25 mg total) by mouth at bedtime., Disp: 30 tablet, Rfl: 2   sodium chloride 1 g tablet, Take 1 tablet (1 g total) by mouth 4 (four) times daily., Disp: 120 tablet, Rfl: 0   lansoprazole (PREVACID) 30 MG capsule, TAKE 1 CAPSULE(30 MG) BY MOUTH DAILY, Disp: 30 capsule, Rfl: 0   loperamide (IMODIUM) 2 MG capsule, Take 2 mg by mouth as needed for diarrhea or loose stools. (Patient not taking: No sig reported), Disp: , Rfl:    LORazepam (ATIVAN) 0.5 MG tablet, Take 1 tablet (0.5 mg total) by mouth every 6 (six) hours as needed for anxiety. (Patient not taking: Reported on 06/02/2021), Disp: 120 tablet, Rfl: 0   Phenazopyridine HCl (AZO URINARY PAIN PO), Take by mouth., Disp: , Rfl:  No current facility-administered medications for this visit.  Facility-Administered Medications Ordered in Other Visits:    0.9 %  sodium chloride infusion, , Intravenous, Once, Earlie Server, MD  Physical exam:  Vitals:   06/02/21 1011  BP: 136/62  Pulse: 76  Resp: 18  Temp: 99.4 F (37.4 C)  TempSrc: Tympanic  SpO2: 100%  Weight: 149 lb 6.4 oz (67.8 kg)   Physical Exam Constitutional:      General: She is not in acute distress. Cardiovascular:     Rate and Rhythm: Normal rate and regular rhythm.     Heart sounds: Normal heart sounds.  Pulmonary:     Effort: Pulmonary effort is normal.     Breath sounds: Normal breath sounds.  Abdominal:     General: Bowel sounds are normal.      Palpations: Abdomen is soft.  Skin:    General: Skin is  warm and dry.  Neurological:     Mental Status: She is alert and oriented to person, place, and time.     CMP Latest Ref Rng & Units 06/02/2021  Glucose 70 - 99 mg/dL 101(H)  BUN 8 - 23 mg/dL 14  Creatinine 0.44 - 1.00 mg/dL 0.76  Sodium 135 - 145 mmol/L 123(L)  Potassium 3.5 - 5.1 mmol/L 3.5  Chloride 98 - 111 mmol/L 91(L)  CO2 22 - 32 mmol/L 24  Calcium 8.9 - 10.3 mg/dL 8.7(L)  Total Protein 6.5 - 8.1 g/dL 6.4(L)  Total Bilirubin 0.3 - 1.2 mg/dL 0.4  Alkaline Phos 38 - 126 U/L 61  AST 15 - 41 U/L 20  ALT 0 - 44 U/L 17   CBC Latest Ref Rng & Units 06/02/2021  WBC 4.0 - 10.5 K/uL 5.3  Hemoglobin 12.0 - 15.0 g/dL 10.5(L)  Hematocrit 36.0 - 46.0 % 29.9(L)  Platelets 150 - 400 K/uL 195    No images are attached to the encounter.  MR Brain W Wo Contrast  Result Date: 05/26/2021 CLINICAL DATA:  Small-cell lung cancer, assess treatment response. EXAM: MRI HEAD WITHOUT AND WITH CONTRAST TECHNIQUE: Multiplanar, multiecho pulse sequences of the brain and surrounding structures were obtained without and with intravenous contrast. CONTRAST:  58mL GADAVIST GADOBUTROL 1 MMOL/ML IV SOLN COMPARISON:  MRI of the brain February 07, 2021 FINDINGS: Brain: No acute infarction, hemorrhage, hydrocephalus, extra-axial collection or mass lesion. Scattered and confluent T2 hyperintensity are seen within the white matter of the cerebral hemispheres and within the pons, may represent microangiopathy versus posttreatment changes. A punctate T2 hyperintense focus with susceptibility artifact is noted in the posterior right parietal lobe without evidence of contrast enhancement, consistent with treated metastases. Vascular: Normal flow voids. Skull and upper cervical spine: Normal marrow signal. Sinuses/Orbits: Negative. Other: None. IMPRESSION: No focus of abnormal contrast enhancement to suggest metastatic disease to the brain, consistent with treatment  response. Electronically Signed   By: Pedro Earls M.D.   On: 05/26/2021 15:46     Assessment and plan- Patient is a 75 y.o. 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  She is here for on treatment assessment prior to cycle 3 of palliative Tecentriq  Counts we will proceed with cycle 3 of palliative Tecentriq today and I will see her back in 3 weeks for cycle 4.  Repeat scans will be due after next cycle.  Brain metastases: S/p whole brain radiation treatment there continues to be current treatment response and no evidence of progressive disease noted on her MRI brain from 05/26/2021.  Suspect generalized weakness/deconditioning combination of chemotherapy and radiation treatment I will see if she would qualify for any home physical therapy at this time.  Hyponatremia: ChronicPatient is taking salt tablets at home as well.  Has not really responded to IV fluids in the past.  Continue to monitor   Visit Diagnosis 1. Lung cancer metastatic to brain [facility])   2. Other specified disorders involving the immune mechanism, not elsewhere classified ([place])    3. Encounter for antineoplastic immunotherapy      Dr. Randa Evens, MD, MPH [facility] [phone] 06/02/2021 1:12 PM

## 2021-06-02 NOTE — Progress Notes (Signed)
Nutrition Follow-up:   Patient with small cell lung cancer with mets to bone.  Patient completed radiation on 5/31 and currently receiving tecentriq.    Met with patient during infusion. Reports that she is drinking 3 boost soothe shakes during the day and 3 big gatorades.  Usually eats peanut butter crackers for breakfast. Lunch yesterday was barbecue chicken and french fries and orange.  Last night ate fruit and a meal of meat and vegetables.  Wanting to know if she can cut back on shakes.  Denies nausea or nutrition impact symptoms.    Medications: reviewed  Labs: reviewed  Anthropometrics:   Weight 149 lb 6.4 oz today 146 lb 14.4 oz 139 lb on 8/9 145 lb on 7/5 140 lb on 6/28 139 lb on 5/31 145 lb on 5/12 144 lb on 3/30   NUTRITION DIAGNOSIS: Inadequate oral intake improving    INTERVENTION:  Can reduce shakes to BID in between meals.   Encouraged patient to eat 3 meals per day including protein source at each meal Continue drinking fluids  MONITORING, EVALUATION, GOAL: weight trends, intake   NEXT VISIT: to be determined with treatment   Raquel Sayres B. Zenia Resides, [place], [place] Registered Dietitian [phone] (mobile)

## 2021-06-07 ENCOUNTER — Other Ambulatory Visit: Payer: Self-pay | Admitting: Oncology

## 2021-06-08 ENCOUNTER — Encounter: Payer: Self-pay | Admitting: Oncology

## 2021-06-13 ENCOUNTER — Encounter: Payer: Self-pay | Admitting: Oncology

## 2021-06-16 ENCOUNTER — Other Ambulatory Visit: Payer: Self-pay

## 2021-06-16 DIAGNOSIS — R531 Weakness: Secondary | ICD-10-CM

## 2021-06-19 ENCOUNTER — Other Ambulatory Visit: Payer: Self-pay | Admitting: Oncology

## 2021-06-23 ENCOUNTER — Inpatient Hospital Stay: Payer: Medicare Other

## 2021-06-23 ENCOUNTER — Other Ambulatory Visit: Payer: Self-pay | Admitting: *Deleted

## 2021-06-23 ENCOUNTER — Inpatient Hospital Stay: Payer: Medicare Other | Attending: Oncology

## 2021-06-23 ENCOUNTER — Inpatient Hospital Stay (HOSPITAL_BASED_OUTPATIENT_CLINIC_OR_DEPARTMENT_OTHER): Payer: Medicare Other | Admitting: Oncology

## 2021-06-23 ENCOUNTER — Encounter: Payer: Self-pay | Admitting: Oncology

## 2021-06-23 ENCOUNTER — Other Ambulatory Visit: Payer: Self-pay

## 2021-06-23 VITALS — BP 142/64 | HR 83 | Temp 99.1°F | Resp 16 | Wt 149.7 lb

## 2021-06-23 DIAGNOSIS — Z881 Allergy status to other antibiotic agents status: Secondary | ICD-10-CM | POA: Diagnosis not present

## 2021-06-23 DIAGNOSIS — Z885 Allergy status to narcotic agent status: Secondary | ICD-10-CM | POA: Diagnosis not present

## 2021-06-23 DIAGNOSIS — Z5112 Encounter for antineoplastic immunotherapy: Secondary | ICD-10-CM | POA: Diagnosis present

## 2021-06-23 DIAGNOSIS — D8989 Other specified disorders involving the immune mechanism, not elsewhere classified: Secondary | ICD-10-CM

## 2021-06-23 DIAGNOSIS — F17290 Nicotine dependence, other tobacco product, uncomplicated: Secondary | ICD-10-CM | POA: Insufficient documentation

## 2021-06-23 DIAGNOSIS — R5383 Other fatigue: Secondary | ICD-10-CM | POA: Insufficient documentation

## 2021-06-23 DIAGNOSIS — Z8049 Family history of malignant neoplasm of other genital organs: Secondary | ICD-10-CM | POA: Insufficient documentation

## 2021-06-23 DIAGNOSIS — J432 Centrilobular emphysema: Secondary | ICD-10-CM | POA: Diagnosis not present

## 2021-06-23 DIAGNOSIS — C3490 Malignant neoplasm of unspecified part of unspecified bronchus or lung: Secondary | ICD-10-CM

## 2021-06-23 DIAGNOSIS — C3432 Malignant neoplasm of lower lobe, left bronchus or lung: Secondary | ICD-10-CM | POA: Insufficient documentation

## 2021-06-23 DIAGNOSIS — Z818 Family history of other mental and behavioral disorders: Secondary | ICD-10-CM | POA: Insufficient documentation

## 2021-06-23 DIAGNOSIS — Z5181 Encounter for therapeutic drug level monitoring: Secondary | ICD-10-CM

## 2021-06-23 DIAGNOSIS — I700 Atherosclerosis of aorta: Secondary | ICD-10-CM | POA: Diagnosis not present

## 2021-06-23 DIAGNOSIS — C7931 Secondary malignant neoplasm of brain: Secondary | ICD-10-CM

## 2021-06-23 DIAGNOSIS — Z923 Personal history of irradiation: Secondary | ICD-10-CM | POA: Insufficient documentation

## 2021-06-23 DIAGNOSIS — Z87891 Personal history of nicotine dependence: Secondary | ICD-10-CM | POA: Diagnosis not present

## 2021-06-23 DIAGNOSIS — Z8719 Personal history of other diseases of the digestive system: Secondary | ICD-10-CM | POA: Insufficient documentation

## 2021-06-23 DIAGNOSIS — Z7983 Long term (current) use of bisphosphonates: Secondary | ICD-10-CM

## 2021-06-23 DIAGNOSIS — C7951 Secondary malignant neoplasm of bone: Secondary | ICD-10-CM

## 2021-06-23 DIAGNOSIS — Z8249 Family history of ischemic heart disease and other diseases of the circulatory system: Secondary | ICD-10-CM | POA: Diagnosis not present

## 2021-06-23 DIAGNOSIS — Z95828 Presence of other vascular implants and grafts: Secondary | ICD-10-CM

## 2021-06-23 DIAGNOSIS — E871 Hypo-osmolality and hyponatremia: Secondary | ICD-10-CM | POA: Insufficient documentation

## 2021-06-23 DIAGNOSIS — Z880 Allergy status to penicillin: Secondary | ICD-10-CM | POA: Diagnosis not present

## 2021-06-23 DIAGNOSIS — Z8349 Family history of other endocrine, nutritional and metabolic diseases: Secondary | ICD-10-CM | POA: Insufficient documentation

## 2021-06-23 DIAGNOSIS — Z801 Family history of malignant neoplasm of trachea, bronchus and lung: Secondary | ICD-10-CM | POA: Diagnosis not present

## 2021-06-23 DIAGNOSIS — Z888 Allergy status to other drugs, medicaments and biological substances status: Secondary | ICD-10-CM | POA: Insufficient documentation

## 2021-06-23 DIAGNOSIS — Z79899 Other long term (current) drug therapy: Secondary | ICD-10-CM | POA: Insufficient documentation

## 2021-06-23 DIAGNOSIS — Z882 Allergy status to sulfonamides status: Secondary | ICD-10-CM | POA: Insufficient documentation

## 2021-06-23 LAB — CBC WITH DIFFERENTIAL/PLATELET
Abs Immature Granulocytes: 0.12 10*3/uL — ABNORMAL HIGH (ref 0.00–0.07)
Basophils Absolute: 0 10*3/uL (ref 0.0–0.1)
Basophils Relative: 1 %
Eosinophils Absolute: 0.1 10*3/uL (ref 0.0–0.5)
Eosinophils Relative: 1 %
HCT: 32.8 % — ABNORMAL LOW (ref 36.0–46.0)
Hemoglobin: 11.4 g/dL — ABNORMAL LOW (ref 12.0–15.0)
Immature Granulocytes: 2 %
Lymphocytes Relative: 15 %
Lymphs Abs: 0.9 10*3/uL (ref 0.7–4.0)
MCH: 32.9 pg (ref 26.0–34.0)
MCHC: 34.8 g/dL (ref 30.0–36.0)
MCV: 94.8 fL (ref 80.0–100.0)
Monocytes Absolute: 0.6 10*3/uL (ref 0.1–1.0)
Monocytes Relative: 9 %
Neutro Abs: 4.4 10*3/uL (ref 1.7–7.7)
Neutrophils Relative %: 72 %
Platelets: 156 10*3/uL (ref 150–400)
RBC: 3.46 MIL/uL — ABNORMAL LOW (ref 3.87–5.11)
RDW: 14.4 % (ref 11.5–15.5)
WBC: 6.1 10*3/uL (ref 4.0–10.5)
nRBC: 0 % (ref 0.0–0.2)

## 2021-06-23 LAB — COMPREHENSIVE METABOLIC PANEL
ALT: 24 U/L (ref 0–44)
AST: 20 U/L (ref 15–41)
Albumin: 3.7 g/dL (ref 3.5–5.0)
Alkaline Phosphatase: 51 U/L (ref 38–126)
Anion gap: 7 (ref 5–15)
BUN: 13 mg/dL (ref 8–23)
CO2: 27 mmol/L (ref 22–32)
Calcium: 9 mg/dL (ref 8.9–10.3)
Chloride: 95 mmol/L — ABNORMAL LOW (ref 98–111)
Creatinine, Ser: 0.96 mg/dL (ref 0.44–1.00)
GFR, Estimated: >60 mL/min (ref >60)
Glucose, Bld: 100 mg/dL — ABNORMAL HIGH (ref 70–99)
Potassium: 3.6 mmol/L (ref 3.5–5.1)
Sodium: 129 mmol/L — ABNORMAL LOW (ref 135–145)
Total Bilirubin: 0.8 mg/dL (ref 0.3–1.2)
Total Protein: 6.5 g/dL (ref 6.5–8.1)

## 2021-06-23 LAB — TSH: TSH: 1.975 u[IU]/mL (ref 0.350–4.500)

## 2021-06-23 MED ORDER — HEPARIN SOD (PORK) LOCK FLUSH 100 UNIT/ML IV SOLN
500.0000 [IU] | Freq: Once | INTRAVENOUS | Status: AC
  Filled 2021-06-23: qty 5

## 2021-06-23 MED ORDER — SODIUM CHLORIDE 0.9 % IV SOLN
Freq: Once | INTRAVENOUS | Status: AC
  Filled 2021-06-23: qty 250

## 2021-06-23 MED ORDER — SODIUM CHLORIDE 0.9% FLUSH
10.0000 mL | Freq: Once | INTRAVENOUS | Status: AC
  Administered 2021-06-23: 10 mL via INTRAVENOUS
  Filled 2021-06-23: qty 10

## 2021-06-23 MED ORDER — HEPARIN SOD (PORK) LOCK FLUSH 100 UNIT/ML IV SOLN
INTRAVENOUS | Status: AC
  Administered 2021-06-23: 500 [IU] via INTRAVENOUS
  Filled 2021-06-23: qty 5

## 2021-06-23 MED ORDER — HEPARIN SOD (PORK) LOCK FLUSH 100 UNIT/ML IV SOLN
500.0000 [IU] | Freq: Once | INTRAVENOUS | Status: DC | PRN
  Filled 2021-06-23: qty 5

## 2021-06-23 MED ORDER — SODIUM CHLORIDE 0.9 % IV SOLN
1200.0000 mg | Freq: Once | INTRAVENOUS | Status: AC
  Administered 2021-06-23: 1200 mg via INTRAVENOUS
  Filled 2021-06-23: qty 20

## 2021-06-23 MED ORDER — ZOLEDRONIC ACID 4 MG/5ML IV CONC
3.3000 mg | INTRAVENOUS | Status: DC
  Administered 2021-06-23: 3.3 mg via INTRAVENOUS
  Filled 2021-06-23: qty 4.13

## 2021-06-23 NOTE — Patient Instructions (Signed)
[facility]  Discharge Instructions: Thank you for choosing [facility] to provide your oncology and hematology care.  If you have a lab appointment with the [facility], please go directly to the [facility] and check in at the registration area.  Wear comfortable clothing and clothing appropriate for easy access to any Portacath or PICC line.   We strive to give you quality time with your provider. You may need to reschedule your appointment if you arrive late (15 or more minutes).  Arriving late affects you and other patients whose appointments are after yours.  Also, if you miss three or more appointments without notifying the office, you may be dismissed from the clinic at the provider's discretion.      For prescription refill requests, have your pharmacy contact our office and allow 72 hours for refills to be completed.    Today you received the following chemotherapy and/or immunotherapy agents Tecentriq      To help prevent nausea and vomiting after your treatment, we encourage you to take your nausea medication as directed.  BELOW ARE SYMPTOMS THAT SHOULD BE REPORTED IMMEDIATELY: *FEVER GREATER THAN 100.4 F (38 C) OR HIGHER *CHILLS OR SWEATING *NAUSEA AND VOMITING THAT IS NOT CONTROLLED WITH YOUR NAUSEA MEDICATION *UNUSUAL SHORTNESS OF BREATH *UNUSUAL BRUISING OR BLEEDING *URINARY PROBLEMS (pain or burning when urinating, or frequent urination) *BOWEL PROBLEMS (unusual diarrhea, constipation, pain near the anus) TENDERNESS IN MOUTH AND THROAT WITH OR WITHOUT PRESENCE OF ULCERS (sore throat, sores in mouth, or a toothache) UNUSUAL RASH, SWELLING OR PAIN  UNUSUAL VAGINAL DISCHARGE OR ITCHING   Items with * indicate a potential emergency and should be followed up as soon as possible or go to the Emergency Department if any problems should occur.  Please show the CHEMOTHERAPY ALERT CARD or IMMUNOTHERAPY ALERT CARD at check-in  to the Emergency Department and triage nurse.  Should you have questions after your visit or need to cancel or reschedule your appointment, please contact [facility]  [phone] and follow the prompts.  Office hours are 8:00 a.m. to 4:30 p.m. Monday - Friday. Please note that voicemails left after 4:00 p.m. may not be returned until the following business day.  We are closed weekends and major holidays. You have access to a nurse at all times for urgent questions. Please call the main number to the clinic [phone] and follow the prompts.  For any non-urgent questions, you may also contact your provider using MyChart. We now offer e-Visits for anyone 62 and older to request care online for non-urgent symptoms. For details visit [email or web address].   Also download the MyChart app! Go to the app store, search "MyChart", open the app, select [facility], and log in with your MyChart username and password.  Due to Covid, a mask is required upon entering the hospital/clinic. If you do not have a mask, one will be given to you upon arrival. For doctor visits, patients may have 1 support person aged 20 or older with them. For treatment visits, patients cannot have anyone with them due to current Covid guidelines and our immunocompromised population.     Zoledronic Acid Injection (Hypercalcemia, Oncology) What is this medication? ZOLEDRONIC ACID (ZOE le dron ik AS id) slows calcium loss from bones. It high calcium levels in the blood from some kinds of cancer. It may be used in other people at risk for bone loss. This medicine may be used for other  purposes; ask your health care provider or pharmacist if you have questions. COMMON BRAND NAME(S): Zometa What should I tell my care team before I take this medication? They need to know if you have any of these conditions: cancer dehydration dental disease kidney disease liver disease low levels of calcium in  the blood lung or breathing disease (asthma) receiving steroids like dexamethasone or prednisone an unusual or allergic reaction to zoledronic acid, other medicines, foods, dyes, or preservatives pregnant or trying to get pregnant breast-feeding How should I use this medication? This drug is injected into a vein. It is given by a health care provider in a hospital or clinic setting. Talk to your health care provider about the use of this drug in children. Special care may be needed. Overdosage: If you think you have taken too much of this medicine contact a poison control center or emergency room at once. NOTE: This medicine is only for you. Do not share this medicine with others. What if I miss a dose? Keep appointments for follow-up doses. It is important not to miss your dose. Call your health care provider if you are unable to keep an appointment. What may interact with this medication? certain antibiotics given by injection NSAIDs, medicines for pain and inflammation, like ibuprofen or naproxen some diuretics like bumetanide, furosemide teriparatide thalidomide This list may not describe all possible interactions. Give your health care provider a list of all the medicines, herbs, non-prescription drugs, or dietary supplements you use. Also tell them if you smoke, drink alcohol, or use illegal drugs. Some items may interact with your medicine. What should I watch for while using this medication? Visit your health care provider for regular checks on your progress. It may be some time before you see the benefit from this drug. Some people who take this drug have severe bone, joint, or muscle pain. This drug may also increase your risk for jaw problems or a broken thigh bone. Tell your health care provider right away if you have severe pain in your jaw, bones, joints, or muscles. Tell you health care provider if you have any pain that does not go away or that gets worse. Tell your dentist and  dental surgeon that you are taking this drug. You should not have major dental surgery while on this drug. See your dentist to have a dental exam and fix any dental problems before starting this drug. Take good care of your teeth while on this drug. Make sure you see your dentist for regular follow-up appointments. You should make sure you get enough calcium and vitamin D while you are taking this drug. Discuss the foods you eat and the vitamins you take with your health care provider. Check with your health care provider if you have severe diarrhea, nausea, and vomiting, or if you sweat a lot. The loss of too much body fluid may make it dangerous for you to take this drug. You may need blood work done while you are taking this drug. Do not become pregnant while taking this drug. Women should inform their health care provider if they wish to become pregnant or think they might be pregnant. There is potential for serious harm to an unborn child. Talk to your health care provider for more information. What side effects may I notice from receiving this medication? Side effects that you should report to your doctor or health care provider as soon as possible: allergic reactions (skin rash, itching or hives; swelling of the face,  lips, or tongue) bone pain infection (fever, chills, cough, sore throat, pain or trouble passing urine) jaw pain, especially after dental work joint pain kidney injury (trouble passing urine or change in the amount of urine) low blood pressure (dizziness; feeling faint or lightheaded, falls; unusually weak or tired) low calcium levels (fast heartbeat; muscle cramps or pain; pain, tingling, or numbness in the hands or feet; seizures) low magnesium levels (fast, irregular heartbeat; muscle cramp or pain; muscle weakness; tremors; seizures) low red blood cell counts (trouble breathing; feeling faint; lightheaded, falls; unusually weak or tired) muscle pain redness, blistering,  peeling, or loosening of the skin, including inside the mouth severe diarrhea swelling of the ankles, feet, hands trouble breathing Side effects that usually do not require medical attention (report to your doctor or health care provider if they continue or are bothersome): anxious constipation coughing depressed mood eye irritation, itching, or pain fever general ill feeling or flu-like symptoms nausea pain, redness, or irritation at site where injected trouble sleeping This list may not describe all possible side effects. Call your doctor for medical advice about side effects. You may report side effects to FDA at 1-800-FDA-1088. Where should I keep my medication? This drug is given in a hospital or clinic. It will not be stored at home. NOTE: This sheet is a summary. It may not cover all possible information. If you have questions about this medicine, talk to your doctor, pharmacist, or health care provider.  2022 Elsevier/Gold Standard (2019-05-24 09:13:00)

## 2021-06-23 NOTE — Progress Notes (Signed)
img 

## 2021-06-23 NOTE — Progress Notes (Signed)
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  She underwent CT chest with contrast in March 2022 following an abnormal x-ray which showed a 2.6 cm mass in the left lower lobe.  This was followed by a PET CT scan which showed a 3.0 x 2.0 cm mass in the left lower lobe of the lung with an SUV of 9.7.  No evidence of locoregional adenopathy.  No evidence of intra-abdominal disease with patient was noted to have a hypermetabolic focus in the right iliac bone with an SUV of 11.1 which was suspicious for metastatic disease.  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  Cells were positive for TTF-1, CD56 and chromogranin.  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  Patient completed whole brain radiation treatment and completed carbo etoposide Tecentriq 4 cycles on 03/10/2021.    Interval history-she is concerned about a bump that she developed on her left leg while she was trying to  exercise.  She still has some ongoing fatigue but she is making slow progress towards improvement.  ECOG PS- 2 Pain scale- 0   Review of systems- Review of Systems  Constitutional:  Positive for malaise/fatigue. Negative for chills, fever and weight loss.  HENT:  Negative for congestion, ear discharge and nosebleeds.   Eyes:  Negative for blurred vision.  Respiratory:  Negative for cough, hemoptysis, sputum production, shortness of breath and wheezing.   Cardiovascular:  Negative for chest pain, palpitations, orthopnea and claudication.  Gastrointestinal:  Negative for abdominal pain, blood in stool, constipation, diarrhea, heartburn, melena, nausea and vomiting.  Genitourinary:  Negative for dysuria, flank pain, frequency, hematuria and urgency.  Musculoskeletal:  Negative for back pain, joint pain and myalgias.  Skin:  Negative for rash.  Neurological:  Negative for dizziness, tingling, focal weakness, seizures, weakness and headaches.  Endo/Heme/Allergies:  Does not bruise/bleed easily.  Psychiatric/Behavioral:  Negative for depression and suicidal ideas. The patient does not have insomnia.      Allergies  Allergen Reactions   Augmentin [Amoxicillin-Pot Clavulanate]     "messes up my blood cells"   Codeine Nausea Only   Levaquin [Levofloxacin]     Delusions    Sulfa Antibiotics Nausea And Vomiting   Vancomycin Other (See Comments)    [place] syndome- happened in the hospital at [facility]   Tape Rash    Some bandaids cause rash sometimes     Past Medical History:  Diagnosis Date   Anxiety    Breathing problem    low breathing function 53%   COPD (chronic obstructive pulmonary  disease) ([place])    EMPHYSEMA   Dysrhythmia    tachycardia   Emphysema of lung (H[facility])    Hemorrhoid    History of hiatal hernia    SMALL- 2022   Hypercholesteremia    Hypertension    Hypothyroidism    Larynx polyp    Lung cancer ([place])    Palpitations    with anxiety   Platelets decreased ([place])  2020   Seasonal allergies    Tinnitus    Vertigo    no episodes in several years   Wears dentures    partial lower   White coat syndrome with hypertension      Past Surgical History:  Procedure Laterality Date   ABDOMINAL HYSTERECTOMY     partial   COLONOSCOPY WITH PROPOFOL N/A 01/22/2016   Procedure: COLONOSCOPY WITH PROPOFOL;  Surgeon: Lucilla Lame, MD;  Location: [place];  Service: Endoscopy;  Laterality: N/A;   COLONOSCOPY WITH PROPOFOL N/A 04/12/2019   Procedure: COLONOSCOPY WITH BIOPSY;  Surgeon: Lucilla Lame, MD;  Location: [place];  Service: Endoscopy;  Laterality: N/A;  pt would like an early appt   ct guided lung bx     IR IMAGING GUIDED PORT INSERTION  12/18/2020   POLYPECTOMY  01/22/2016   Procedure: POLYPECTOMY;  Surgeon: Lucilla Lame, MD;  Location: [place];  Service: Endoscopy;;   POLYPECTOMY N/A 04/12/2019   Procedure: POLYPECTOMY;  Surgeon: Lucilla Lame, MD;  Location: [place];  Service: Endoscopy;  Laterality: N/A;   VIDEO BRONCHOSCOPY WITH ENDOBRONCHIAL NAVIGATION N/A 11/21/2020   Procedure: VIDEO BRONCHOSCOPY WITH ENDOBRONCHIAL NAVIGATION;  Surgeon: Ottie Glazier, MD;  Location: [facility];  Service: Thoracic;  Laterality: N/A;   VIDEO BRONCHOSCOPY WITH ENDOBRONCHIAL ULTRASOUND N/A 11/21/2020   Procedure: VIDEO BRONCHOSCOPY WITH ENDOBRONCHIAL ULTRASOUND;  Surgeon: Ottie Glazier, MD;  Location: [facility];  Service: Thoracic;  Laterality: N/A;    Social History   Socioeconomic History   Marital status: Married    Spouse name: Not on file   Number of children: Not on file   Years of education: Not on file   Highest education level: Not on file  Occupational History   Not on file  Tobacco Use   Smoking status: Former    Packs/day: 0.50    Years: 50.00    Pack years: 25.00    Types: Cigarettes    Quit date: 11/03/2020    Years since quitting: 0.6   Smokeless tobacco: Never  Vaping Use   Vaping Use: Some days   Substance and Sexual Activity   Alcohol use: No   Drug use: Never   Sexual activity: Not on file  Other Topics Concern   Not on file  Social History Narrative   Not on file   Social Determinants of Health   Financial Resource Strain: Not on file  Food Insecurity: Not on file  Transportation Needs: Not on file  Physical Activity: Not on file  Stress: Not on file  Social Connections: Not on file  Intimate Partner Violence: Not on file    Family History  Problem Relation Age of Onset   Alzheimer's disease Mother    Heart attack Mother    High Cholesterol Mother    Hypertension Mother    Heart block Mother    Lung cancer Father    Uterine cancer Sister    Hypertension Sister    Heart block Sister    Anxiety disorder Sister    Breast cancer Neg Hx  Current Outpatient Medications:    acetaminophen (TYLENOL) 500 MG tablet, Take 500 mg by mouth every 6 (six) hours as needed for mild pain or moderate pain., Disp: , Rfl:    atorvastatin (LIPITOR) 10 MG tablet, TAKE 1 TABLET BY MOUTH DAILY, Disp: 90 tablet, Rfl: 0   ketotifen (ZADITOR) 0.025 % ophthalmic solution, Place 1 drop into both eyes 2 (two) times daily as needed., Disp: , Rfl:    levothyroxine (SYNTHROID, LEVOTHROID) 50 MCG tablet, Take 50 mcg by mouth daily before breakfast., Disp: , Rfl:    loperamide (IMODIUM) 2 MG capsule, Take 2 mg by mouth as needed for diarrhea or loose stools., Disp: , Rfl:    loratadine (CLARITIN) 10 MG tablet, Take 10 mg by mouth daily. Reported on 01/22/2016, Disp: , Rfl:    LORazepam (ATIVAN) 0.5 MG tablet, Take 1 tablet (0.5 mg total) by mouth every 6 (six) hours as needed for anxiety., Disp: 120 tablet, Rfl: 0   losartan-hydrochlorothiazide (HYZAAR) 100-12.5 MG tablet, , Disp: , Rfl:    magnesium chloride (SLOW-MAG) 64 MG TBEC SR tablet, Take 1 tablet (64 mg total) by mouth daily., Disp: 30 tablet, Rfl: 0   metoprolol succinate (TOPROL-XL) 50 MG 24 hr tablet, Take 50 mg by mouth daily.,  Disp: , Rfl:    nystatin (MYCOSTATIN) 100000 UNIT/ML suspension, SHAKE LIQUID AND TAKE 5 ML BY MOUTH FOUR TIMES DAILY, Disp: 60 mL, Rfl: 0   ondansetron (ZOFRAN) 8 MG tablet, TAKE 1 TABLET BY MOUTH TWICE DAILY AS NEEDED FOR REFRACTORY NAUSEA/ VOMITING. START ON DAY 3 AFTER CARBOPLATIN CHEMO, Disp: 30 tablet, Rfl: 3   potassium chloride SA (KLOR-CON) 20 MEQ tablet, TAKE 1 TABLET BY MOUTH DAILY, Disp: 28 tablet, Rfl: 0   prochlorperazine (COMPAZINE) 10 MG tablet, Take 10 mg by mouth as needed for nausea or vomiting., Disp: , Rfl:    QUEtiapine (SEROQUEL) 25 MG tablet, Take 1 tablet (25 mg total) by mouth at bedtime., Disp: 30 tablet, Rfl: 2   sodium chloride 1 g tablet, Take 1 tablet (1 g total) by mouth 4 (four) times daily., Disp: 120 tablet, Rfl: 0   TRELEGY ELLIPTA 100-62.5-25 MCG/ACT AEPB, INHALE 1 PUFF INTO THE LUNGS DAILY, Disp: 60 each, Rfl: 3   lansoprazole (PREVACID) 30 MG capsule, TAKE 1 CAPSULE(30 MG) BY MOUTH DAILY, Disp: 30 capsule, Rfl: 0   Phenazopyridine HCl (AZO URINARY PAIN PO), Take by mouth., Disp: , Rfl:  No current facility-administered medications for this visit.  Facility-Administered Medications Ordered in Other Visits:    0.9 %  sodium chloride infusion, , Intravenous, Once, Earlie Server, MD   heparin lock flush 100 unit/mL, 500 Units, Intracatheter, Once PRN, Sindy Guadeloupe, MD   zoledronic acid (ZOMETA) 3.3 mg in sodium chloride 0.9 % 100 mL IVPB, 3.3 mg, Intravenous, Q28 days, Sindy Guadeloupe, MD, Stopped at 06/23/21 1147  Physical exam:  Vitals:   06/23/21 1044  BP: (!) 142/64  Pulse: 83  Resp: 16  Temp: 99.1 F (37.3 C)  TempSrc: Tympanic  SpO2: 100%  Weight: 149 lb 11.2 oz (67.9 kg)   Physical Exam Constitutional:      Comments: Sitting in a wheelchair.  Appears in no acute distress  Cardiovascular:     Rate and Rhythm: Normal rate and regular rhythm.     Heart sounds: Normal heart sounds.  Pulmonary:     Effort: Pulmonary effort is normal.     Breath  sounds: Normal breath sounds.  Abdominal:  General: Bowel sounds are normal.     Palpations: Abdomen is soft.  Musculoskeletal:     Comments: Scattered changes of hyperpigmentation noted over bilateral lower extremities likely from stasis dermatitis.  There is a small 0.5 cm ulceration seen over the left leg from recent trauma.  No evidence of cellulitis  Skin:    General: Skin is warm and dry.  Neurological:     Mental Status: She is alert and oriented to person, place, and time.     CMP Latest Ref Rng & Units 06/23/2021  Glucose 70 - 99 mg/dL 100(H)  BUN 8 - 23 mg/dL 13  Creatinine 0.44 - 1.00 mg/dL 0.96  Sodium 135 - 145 mmol/L 129(L)  Potassium 3.5 - 5.1 mmol/L 3.6  Chloride 98 - 111 mmol/L 95(L)  CO2 22 - 32 mmol/L 27  Calcium 8.9 - 10.3 mg/dL 9.0  Total Protein 6.5 - 8.1 g/dL 6.5  Total Bilirubin 0.3 - 1.2 mg/dL 0.8  Alkaline Phos 38 - 126 U/L 51  AST 15 - 41 U/L 20  ALT 0 - 44 U/L 24   CBC Latest Ref Rng & Units 06/23/2021  WBC 4.0 - 10.5 K/uL 6.1  Hemoglobin 12.0 - 15.0 g/dL 11.4(L)  Hematocrit 36.0 - 46.0 % 32.8(L)  Platelets 150 - 400 K/uL 156    No images are attached to the encounter.  MR Brain W Wo Contrast  Result Date: 05/26/2021 CLINICAL DATA:  Small-cell lung cancer, assess treatment response. EXAM: MRI HEAD WITHOUT AND WITH CONTRAST TECHNIQUE: Multiplanar, multiecho pulse sequences of the brain and surrounding structures were obtained without and with intravenous contrast. CONTRAST:  5mL GADAVIST GADOBUTROL 1 MMOL/ML IV SOLN COMPARISON:  MRI of the brain February 07, 2021 FINDINGS: Brain: No acute infarction, hemorrhage, hydrocephalus, extra-axial collection or mass lesion. Scattered and confluent T2 hyperintensity are seen within the white matter of the cerebral hemispheres and within the pons, may represent microangiopathy versus posttreatment changes. A punctate T2 hyperintense focus with susceptibility artifact is noted in the posterior right parietal lobe  without evidence of contrast enhancement, consistent with treated metastases. Vascular: Normal flow voids. Skull and upper cervical spine: Normal marrow signal. Sinuses/Orbits: Negative. Other: None. IMPRESSION: No focus of abnormal contrast enhancement to suggest metastatic disease to the brain, consistent with treatment response. Electronically Signed   By: Pedro Earls M.D.   On: 05/26/2021 15:46     Assessment and plan- Patient is a 75 y.o. 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  She is here for on treatment assessment prior to cycle 4 of palliative Tecentriq  Counts okay to proceed with cycle 4 of palliative Tecentriq today.  I will see her back in 3 weeks for cycle 5.  I will get a CT chest abdomen and pelvis with contrast and bone scan prior.  Overall patient is tolerating immunotherapy well without any significant side effects.  Hyponatremia: Chronic she will get 1 L of IV fluids today.  Bone metastases: We will resume Zometa at this time and she will receive it every 6 weeks.   Visit Diagnosis 1. Encounter for antineoplastic immunotherapy   2. Small cell lung cancer in adult ([place])   3. Bone metastases ([place])   4. Encounter for monitoring zoledronic acid therapy      Dr. Randa Evens, MD, MPH [facility] at [facility] [phone] 06/23/2021 4:31 PM

## 2021-07-03 ENCOUNTER — Other Ambulatory Visit: Payer: Self-pay | Admitting: Radiation Oncology

## 2021-07-04 ENCOUNTER — Other Ambulatory Visit: Payer: Self-pay | Admitting: Oncology

## 2021-07-05 ENCOUNTER — Encounter: Payer: Self-pay | Admitting: Oncology

## 2021-07-06 ENCOUNTER — Encounter: Payer: Self-pay | Admitting: Oncology

## 2021-07-06 ENCOUNTER — Encounter
Admission: RE | Admit: 2021-07-06 | Discharge: 2021-07-06 | Disposition: A | Discharge status: Home / Self Care | Payer: Medicare Other | Source: Ambulatory Visit | Attending: Oncology | Admitting: Oncology

## 2021-07-06 ENCOUNTER — Ambulatory Visit
Admission: RE | Admit: 2021-07-06 | Discharge: 2021-07-06 | Disposition: A | Discharge status: Home / Self Care | Payer: Medicare Other | Source: Ambulatory Visit | Attending: Oncology | Admitting: Oncology

## 2021-07-06 ENCOUNTER — Other Ambulatory Visit: Payer: Self-pay

## 2021-07-06 DIAGNOSIS — C7931 Secondary malignant neoplasm of brain: Secondary | ICD-10-CM | POA: Insufficient documentation

## 2021-07-06 DIAGNOSIS — C3490 Malignant neoplasm of unspecified part of unspecified bronchus or lung: Secondary | ICD-10-CM | POA: Insufficient documentation

## 2021-07-06 MED ORDER — TECHNETIUM TC 99M MEDRONATE IV KIT
20.0000 | PACK | Freq: Once | INTRAVENOUS | Status: AC | PRN
  Administered 2021-07-06: 22.06 via INTRAVENOUS

## 2021-07-09 ENCOUNTER — Ambulatory Visit
Admission: RE | Admit: 2021-07-09 | Discharge: 2021-07-09 | Disposition: A | Discharge status: Home / Self Care | Payer: Medicare Other | Source: Ambulatory Visit | Attending: Oncology | Admitting: Oncology

## 2021-07-09 ENCOUNTER — Other Ambulatory Visit: Payer: Self-pay

## 2021-07-09 DIAGNOSIS — C7931 Secondary malignant neoplasm of brain: Secondary | ICD-10-CM | POA: Insufficient documentation

## 2021-07-09 DIAGNOSIS — C3490 Malignant neoplasm of unspecified part of unspecified bronchus or lung: Secondary | ICD-10-CM | POA: Insufficient documentation

## 2021-07-09 MED ORDER — IOHEXOL 300 MG/ML  SOLN
100.0000 mL | Freq: Once | INTRAMUSCULAR | Status: AC | PRN
  Administered 2021-07-09: 15:00:00 100 mL via INTRAVENOUS

## 2021-07-10 ENCOUNTER — Encounter: Payer: Self-pay | Admitting: Oncology

## 2021-07-14 ENCOUNTER — Inpatient Hospital Stay (HOSPITAL_BASED_OUTPATIENT_CLINIC_OR_DEPARTMENT_OTHER): Payer: Medicare Other | Admitting: Oncology

## 2021-07-14 ENCOUNTER — Encounter: Payer: Self-pay | Admitting: Oncology

## 2021-07-14 ENCOUNTER — Ambulatory Visit: Payer: Medicare Other | Admitting: Oncology

## 2021-07-14 ENCOUNTER — Ambulatory Visit
Admission: RE | Admit: 2021-07-14 | Discharge: 2021-07-14 | Disposition: A | Discharge status: Home / Self Care | Payer: Medicare Other | Source: Ambulatory Visit | Attending: Radiation Oncology | Admitting: Radiation Oncology

## 2021-07-14 ENCOUNTER — Other Ambulatory Visit: Payer: Self-pay

## 2021-07-14 ENCOUNTER — Other Ambulatory Visit: Payer: Medicare Other

## 2021-07-14 ENCOUNTER — Inpatient Hospital Stay: Payer: Medicare Other

## 2021-07-14 ENCOUNTER — Encounter: Payer: Self-pay | Admitting: Radiation Oncology

## 2021-07-14 ENCOUNTER — Ambulatory Visit: Payer: Medicare Other

## 2021-07-14 VITALS — BP 176/66 | HR 75

## 2021-07-14 VITALS — Wt 151.0 lb

## 2021-07-14 VITALS — BP 158/77 | HR 91 | Temp 98.9°F | Resp 18 | Wt 151.2 lb

## 2021-07-14 DIAGNOSIS — C3490 Malignant neoplasm of unspecified part of unspecified bronchus or lung: Secondary | ICD-10-CM

## 2021-07-14 DIAGNOSIS — Z5112 Encounter for antineoplastic immunotherapy: Secondary | ICD-10-CM

## 2021-07-14 DIAGNOSIS — C7931 Secondary malignant neoplasm of brain: Secondary | ICD-10-CM | POA: Diagnosis not present

## 2021-07-14 LAB — CBC WITH DIFFERENTIAL/PLATELET
Abs Immature Granulocytes: 0.04 10*3/uL (ref 0.00–0.07)
Basophils Absolute: 0 10*3/uL (ref 0.0–0.1)
Basophils Relative: 1 %
Eosinophils Absolute: 0 10*3/uL (ref 0.0–0.5)
Eosinophils Relative: 1 %
HCT: 33.8 % — ABNORMAL LOW (ref 36.0–46.0)
Hemoglobin: 11.9 g/dL — ABNORMAL LOW (ref 12.0–15.0)
Immature Granulocytes: 1 %
Lymphocytes Relative: 17 %
Lymphs Abs: 1.1 10*3/uL (ref 0.7–4.0)
MCH: 32.4 pg (ref 26.0–34.0)
MCHC: 35.2 g/dL (ref 30.0–36.0)
MCV: 92.1 fL (ref 80.0–100.0)
Monocytes Absolute: 0.6 10*3/uL (ref 0.1–1.0)
Monocytes Relative: 10 %
Neutro Abs: 4.3 10*3/uL (ref 1.7–7.7)
Neutrophils Relative %: 70 %
Platelets: 159 10*3/uL (ref 150–400)
RBC: 3.67 MIL/uL — ABNORMAL LOW (ref 3.87–5.11)
RDW: 14 % (ref 11.5–15.5)
WBC: 6.1 10*3/uL (ref 4.0–10.5)
nRBC: 0 % (ref 0.0–0.2)

## 2021-07-14 LAB — COMPREHENSIVE METABOLIC PANEL
ALT: 22 U/L (ref 0–44)
AST: 23 U/L (ref 15–41)
Albumin: 3.8 g/dL (ref 3.5–5.0)
Alkaline Phosphatase: 50 U/L (ref 38–126)
Anion gap: 9 (ref 5–15)
BUN: 8 mg/dL (ref 8–23)
CO2: 23 mmol/L (ref 22–32)
Calcium: 8.5 mg/dL — ABNORMAL LOW (ref 8.9–10.3)
Chloride: 96 mmol/L — ABNORMAL LOW (ref 98–111)
Creatinine, Ser: 0.85 mg/dL (ref 0.44–1.00)
GFR, Estimated: >60 mL/min (ref >60)
Glucose, Bld: 116 mg/dL — ABNORMAL HIGH (ref 70–99)
Potassium: 3.5 mmol/L (ref 3.5–5.1)
Sodium: 128 mmol/L — ABNORMAL LOW (ref 135–145)
Total Bilirubin: 0.4 mg/dL (ref 0.3–1.2)
Total Protein: 6.7 g/dL (ref 6.5–8.1)

## 2021-07-14 MED ORDER — SODIUM CHLORIDE 0.9 % IV SOLN
1200.0000 mg | Freq: Once | INTRAVENOUS | Status: AC
  Administered 2021-07-14: 1200 mg via INTRAVENOUS
  Filled 2021-07-14: qty 20

## 2021-07-14 MED ORDER — SODIUM CHLORIDE 0.9 % IV SOLN
Freq: Once | INTRAVENOUS | Status: AC
  Filled 2021-07-14: qty 250

## 2021-07-14 MED ORDER — HEPARIN SOD (PORK) LOCK FLUSH 100 UNIT/ML IV SOLN
500.0000 [IU] | Freq: Once | INTRAVENOUS | Status: AC | PRN
  Administered 2021-07-14: 500 [IU]
  Filled 2021-07-14: qty 5

## 2021-07-14 NOTE — Progress Notes (Signed)
Radiation Oncology Follow up Note old patient new area lung mass  Name: Beth Mcmillan   Date:   07/14/2021 MRN:  [id number] DOB: [birth year]    This 75 y.o. 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  She is currently under treatment with CarboTaxol etoposide and Tecentriq.  She is continue with Tecentriq and has a infusion today.  She has had an excellent response both palliatively in her hip as well as her brain with MRI evidence of complete response.  Her only problem is recent CT scan shows clear progression of left lower lobe pulmonary nodule now measuring 2.7 x 1.9 cm compared to 1.5 x 1.0 cm previously.  No evidence of metastatic disease in the abdomen or pelvis.  She has no evidence of mediastinal or hilar involvement.  She is fairly asymptomatic specifically [place] cough hemoptysis or chest tightness.  COMPLICATIONS OF TREATMENT: none  FOLLOW UP COMPLIANCE: keeps appointments   PHYSICAL EXAM:  Wt 151 lb (68.5 kg)   BMI 23.65 kg/m  Well-developed well-nourished patient in NAD. HEENT reveals PERLA, EOMI, discs not visualized.  Oral cavity is clear. No oral mucosal lesions are identified. Neck is clear without evidence of cervical or supraclavicular adenopathy. Lungs are clear to A&P. Cardiac examination is essentially unremarkable with regular rate and rhythm without murmur rub or thrill. Abdomen is benign with no organomegaly or masses noted. Motor sensory and DTR levels are equal and symmetric in the upper and lower extremities. Cranial nerves II through XII are grossly intact. Proprioception is  intact. No peripheral adenopathy or edema is identified. No motor or sensory levels are noted. Crude visual fields are within normal range.  RADIOLOGY RESULTS: CT scans reviewed compatible with above-stated findings  PLAN: At this time elect to go ahead with SBRT to her left lower lobe lesion.  Would plan on delivering 60 Gray in 5 fractions.  Would use motion restriction for dimensional treatment planning.  I have personally set up and ordered CT simulation for next week.  Low side effect profile was explained to the patient and her family they all seem to comprehend my plan well.  She may develop a slight cough after completion of radiation.  I would like to take this opportunity to thank you for allowing me to participate in the care of your patient.Noreene Filbert, MD

## 2021-07-14 NOTE — Progress Notes (Signed)
Pt will like to discuss her recent scans.

## 2021-07-14 NOTE — Progress Notes (Signed)
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  She underwent CT chest with contrast in March 2022 following an abnormal x-ray which showed a 2.6 cm mass in the left lower lobe.  This was followed by a PET CT scan which showed a 3.0 x 2.0 cm mass in the left lower lobe of the lung with an SUV of 9.7.  No evidence of locoregional adenopathy.  No evidence of intra-abdominal disease with patient was noted to have a hypermetabolic focus in the right iliac bone with an SUV of 11.1 which was suspicious for metastatic disease.  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  Cells were positive for TTF-1, CD56 and chromogranin.  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  Patient completed whole brain radiation treatment and completed carbo etoposide Tecentriq 4 cycles on 03/10/2021.  Interval history-patient reports doing well overall.  She does vape  occasionally.  Tells me that it makes her feel good and more energetic.  She is able to perform some basic chores around the house.  She has not had any recent falls.  ECOG PS- 2 Pain scale- 0   Review of systems- Review of Systems  Constitutional:  Positive for malaise/fatigue. Negative for chills, fever and weight loss.  HENT:  Negative for congestion, ear discharge and nosebleeds.   Eyes:  Negative for blurred vision.  Respiratory:  Negative for cough, hemoptysis, sputum production, shortness of breath and wheezing.   Cardiovascular:  Negative for chest pain, palpitations, orthopnea and claudication.  Gastrointestinal:  Negative for abdominal pain, blood in stool, constipation, diarrhea, heartburn, melena, nausea and vomiting.  Genitourinary:  Negative for dysuria, flank pain, frequency, hematuria and urgency.  Musculoskeletal:  Negative for back pain, joint pain and myalgias.  Skin:  Negative for rash.  Neurological:  Negative for dizziness, tingling, focal weakness, seizures, weakness and headaches.  Endo/Heme/Allergies:  Does not bruise/bleed easily.  Psychiatric/Behavioral:  Negative for depression and suicidal ideas. The patient does not have insomnia.      Allergies  Allergen Reactions   Augmentin [Amoxicillin-Pot Clavulanate]     "messes up my blood cells"   Codeine Nausea Only   Levaquin [Levofloxacin]     Delusions    Sulfa Antibiotics Nausea And Vomiting   Vancomycin Other (See Comments)    [place] syndome- happened in the hospital at [facility]   Tape Rash    Some bandaids cause rash sometimes     Past Medical History:  Diagnosis Date   Anxiety    Breathing problem  low breathing function 53%   COPD (chronic obstructive pulmonary disease) (HCC)    EMPHYSEMA   Dysrhythmia    tachycardia   Emphysema of lung (HCC)    Hemorrhoid    History of hiatal hernia    SMALL- 2022   Hypercholesteremia    Hypertension    Hypothyroidism    Larynx polyp    Lung cancer  (HCC)    Palpitations    with anxiety   Platelets decreased ([place]) 2020   Seasonal allergies    Tinnitus    Vertigo    no episodes in several years   Wears dentures    partial lower   White coat syndrome with hypertension      Past Surgical History:  Procedure Laterality Date   ABDOMINAL HYSTERECTOMY     partial   COLONOSCOPY WITH PROPOFOL N/A 01/22/2016   Procedure: COLONOSCOPY WITH PROPOFOL;  Surgeon: Lucilla Lame, MD;  Location: [place];  Service: Endoscopy;  Laterality: N/A;   COLONOSCOPY WITH PROPOFOL N/A 04/12/2019   Procedure: COLONOSCOPY WITH BIOPSY;  Surgeon: Lucilla Lame, MD;  Location: [place];  Service: Endoscopy;  Laterality: N/A;  pt would like an early appt   ct guided lung bx     IR IMAGING GUIDED PORT INSERTION  12/18/2020   POLYPECTOMY  01/22/2016   Procedure: POLYPECTOMY;  Surgeon: Lucilla Lame, MD;  Location: [place];  Service: Endoscopy;;   POLYPECTOMY N/A 04/12/2019   Procedure: POLYPECTOMY;  Surgeon: Lucilla Lame, MD;  Location: [place];  Service: Endoscopy;  Laterality: N/A;   VIDEO BRONCHOSCOPY WITH ENDOBRONCHIAL NAVIGATION N/A 11/21/2020   Procedure: VIDEO BRONCHOSCOPY WITH ENDOBRONCHIAL NAVIGATION;  Surgeon: Ottie Glazier, MD;  Location: [facility];  Service: Thoracic;  Laterality: N/A;   VIDEO BRONCHOSCOPY WITH ENDOBRONCHIAL ULTRASOUND N/A 11/21/2020   Procedure: VIDEO BRONCHOSCOPY WITH ENDOBRONCHIAL ULTRASOUND;  Surgeon: Ottie Glazier, MD;  Location: [facility];  Service: Thoracic;  Laterality: N/A;    Social History   Socioeconomic History   Marital status: Married    Spouse name: Not on file   Number of children: Not on file   Years of education: Not on file   Highest education level: Not on file  Occupational History   Not on file  Tobacco Use   Smoking status: Former    Packs/day: 0.50    Years: 50.00    Pack years: 25.00    Types: Cigarettes    Quit date: 11/03/2020    Years since quitting: 0.6    Smokeless tobacco: Never  Vaping Use   Vaping Use: Some days  Substance and Sexual Activity   Alcohol use: No   Drug use: Never   Sexual activity: Not on file  Other Topics Concern   Not on file  Social History Narrative   Not on file   Social Determinants of Health   Financial Resource Strain: Not on file  Food Insecurity: Not on file  Transportation Needs: Not on file  Physical Activity: Not on file  Stress: Not on file  Social Connections: Not on file  Intimate Partner Violence: Not on file    Family History  Problem Relation Age of Onset   Alzheimer's disease Mother    Heart attack Mother    High Cholesterol Mother    Hypertension Mother    Heart block Mother    Lung cancer Father    Uterine cancer Sister    Hypertension Sister    Heart block Sister    Anxiety  disorder Sister    Breast cancer Neg Hx      Current Outpatient Medications:    sertraline (ZOLOFT) 50 MG tablet, Take 1 tablet by mouth at bedtime., Disp: , Rfl:    acetaminophen (TYLENOL) 500 MG tablet, Take 500 mg by mouth every 6 (six) hours as needed for mild pain or moderate pain., Disp: , Rfl:    atorvastatin (LIPITOR) 10 MG tablet, TAKE 1 TABLET BY MOUTH DAILY, Disp: 90 tablet, Rfl: 0   ketotifen (ZADITOR) 0.025 % ophthalmic solution, Place 1 drop into both eyes 2 (two) times daily as needed., Disp: , Rfl:    levothyroxine (SYNTHROID, LEVOTHROID) 50 MCG tablet, Take 50 mcg by mouth daily before breakfast., Disp: , Rfl:    loperamide (IMODIUM) 2 MG capsule, Take 2 mg by mouth as needed for diarrhea or loose stools., Disp: , Rfl:    loratadine (CLARITIN) 10 MG tablet, Take 10 mg by mouth daily. Reported on 01/22/2016, Disp: , Rfl:    LORazepam (ATIVAN) 0.5 MG tablet, Take 1 tablet (0.5 mg total) by mouth every 6 (six) hours as needed for anxiety., Disp: 120 tablet, Rfl: 0   losartan-hydrochlorothiazide (HYZAAR) 100-12.5 MG tablet, , Disp: , Rfl:    magnesium chloride (SLOW-MAG) 64 MG TBEC SR tablet, Take  1 tablet (64 mg total) by mouth daily., Disp: 30 tablet, Rfl: 0   metoprolol succinate (TOPROL-XL) 50 MG 24 hr tablet, Take 50 mg by mouth daily., Disp: , Rfl:    ondansetron (ZOFRAN) 8 MG tablet, TAKE 1 TABLET BY MOUTH TWICE DAILY AS NEEDED FOR REFRACTORY NAUSEA/ VOMITING. START ON DAY 3 AFTER CARBOPLATIN CHEMO, Disp: 30 tablet, Rfl: 3   potassium chloride SA (KLOR-CON) 20 MEQ tablet, TAKE 1 TABLET BY MOUTH DAILY, Disp: 28 tablet, Rfl: 0   QUEtiapine (SEROQUEL) 25 MG tablet, Take 1 tablet (25 mg total) by mouth at bedtime., Disp: 30 tablet, Rfl: 2   sertraline (ZOLOFT) 50 MG tablet, Take 50 mg by mouth daily., Disp: , Rfl:    sodium chloride 1 g tablet, Take 1 tablet (1 g total) by mouth 4 (four) times daily., Disp: 120 tablet, Rfl: 0   TRELEGY ELLIPTA 100-62.5-25 MCG/ACT AEPB, INHALE 1 PUFF INTO THE LUNGS DAILY, Disp: 60 each, Rfl: 3 No current facility-administered medications for this visit.  Facility-Administered Medications Ordered in Other Visits:    0.9 %  sodium chloride infusion, , Intravenous, Once, Earlie Server, MD   atezolizumab [facility]) 1,200 mg in sodium chloride 0.9 % 250 mL chemo infusion, 1,200 mg, Intravenous, Once, Sindy Guadeloupe, MD, Last Rate: 540 mL/hr at 07/14/21 1454, 1,200 mg at 07/14/21 1454   heparin lock flush 100 unit/mL, 500 Units, Intracatheter, Once PRN, Sindy Guadeloupe, MD  Physical exam:  Vitals:   07/14/21 1320  BP: (!) 158/77  Pulse: 91  Resp: 18  Temp: 98.9 F (37.2 C)  SpO2: 100%  Weight: 151 lb 3.2 oz (68.6 kg)   Physical Exam Constitutional:      General: She is not in acute distress. Cardiovascular:     Rate and Rhythm: Normal rate and regular rhythm.     Heart sounds: Normal heart sounds.  Pulmonary:     Effort: Pulmonary effort is normal.  Skin:    General: Skin is warm and dry.  Neurological:     Mental Status: She is alert and oriented to person, place, and time.     CMP Latest Ref Rng & Units 07/14/2021  Glucose 70 - 99 mg/dL  116(H)  BUN 8 - 23 mg/dL 8  Creatinine 0.44 - 1.00 mg/dL 0.85  Sodium 135 - 145 mmol/L 128(L)  Potassium 3.5 - 5.1 mmol/L 3.5  Chloride 98 - 111 mmol/L 96(L)  CO2 22 - 32 mmol/L 23  Calcium 8.9 - 10.3 mg/dL 8.5(L)  Total Protein 6.5 - 8.1 g/dL 6.7  Total Bilirubin 0.3 - 1.2 mg/dL 0.4  Alkaline Phos 38 - 126 U/L 50  AST 15 - 41 U/L 23  ALT 0 - 44 U/L 22   CBC Latest Ref Rng & Units 07/14/2021  WBC 4.0 - 10.5 K/uL 6.1  Hemoglobin 12.0 - 15.0 g/dL 11.9(L)  Hematocrit 36.0 - 46.0 % 33.8(L)  Platelets 150 - 400 K/uL 159    No images are attached to the encounter.  NM Bone Scan Whole Body  Result Date: 07/07/2021 CLINICAL DATA:  Small cell lung cancer with bone metastases. EXAM: NUCLEAR MEDICINE WHOLE BODY BONE SCAN TECHNIQUE: Whole body anterior and posterior images were obtained approximately 3 hours after intravenous injection of radiopharmaceutical. RADIOPHARMACEUTICALS:  22.06 mCi Technetium-46m MDP IV COMPARISON:  Bone scan 04/06/2021. FINDINGS: Focal increased activity in the medial right iliac wing is again seen. No new abnormal radiotracer activity is evident. Additional activity consistent with degenerative changes in the shoulders and lumbar spine, and both wrists is again noted. Renal and bladder activity are unremarkable. IMPRESSION: Unchanged uptake abnormality in the medial right iliac wing. No other focal abnormal activity. Electronically Signed   By: Telford Nab M.D.   On: 07/07/2021 03:52   CT CHEST ABDOMEN PELVIS W CONTRAST  Result Date: 07/10/2021 CLINICAL DATA:  Small-cell lung cancer.  Restaging. EXAM: CT CHEST, ABDOMEN, AND PELVIS WITH CONTRAST TECHNIQUE: Multidetector CT imaging of the chest, abdomen and pelvis was performed following the standard protocol during bolus administration of intravenous contrast. CONTRAST:  131mL OMNIPAQUE IOHEXOL 300 MG/ML  SOLN COMPARISON:  Chest CT 03/30/2021.  Abdomen/pelvis CT 03/25/2021. FINDINGS: CT CHEST FINDINGS Cardiovascular:  The heart size is normal. No substantial pericardial effusion. Coronary artery calcification is evident. Moderate atherosclerotic calcification is noted in the wall of the thoracic aorta. Right Port-A-Cath tip is positioned in the upper right atrium. Mediastinum/Nodes: No mediastinal lymphadenopathy. There is no hilar lymphadenopathy. The esophagus has normal imaging features. There is no axillary lymphadenopathy. Lungs/Pleura: Centrilobular and paraseptal emphysema evident. Clear progression of the left lower lobe pulmonary nodule, measuring 2.7 x 1.9 cm today compared to 1.5 x 1.0 cm previously. No new suspicious pulmonary nodule or mass. No focal airspace consolidation. No pleural effusion. Musculoskeletal: No worrisome lytic or sclerotic osseous abnormality. CT ABDOMEN PELVIS FINDINGS Hepatobiliary: No suspicious focal abnormality within the liver parenchyma. There is no evidence for gallstones, gallbladder wall thickening, or pericholecystic fluid. No intrahepatic or extrahepatic biliary dilation. Pancreas: No focal mass lesion. No dilatation of the main duct. No intraparenchymal cyst. No peripancreatic edema. Spleen: No splenomegaly. No focal mass lesion. Adrenals/Urinary Tract: No adrenal nodule or mass. Kidneys unremarkable. 6 mm hypoattenuating lesion upper pole left kidney is stable, too small to characterize but most likely benign. No evidence for hydroureter. Bladder is decompressed. Stomach/Bowel: Tiny hiatal hernia. Stomach unremarkable. Duodenum is normally positioned as is the ligament of Treitz. No small bowel wall thickening. No small bowel dilatation. The terminal ileum is normal. No gross colonic mass. No colonic wall thickening. Vascular/Lymphatic: There is advanced atherosclerotic calcification of the abdominal aorta without aneurysm. There is no gastrohepatic or hepatoduodenal ligament lymphadenopathy. No retroperitoneal or mesenteric lymphadenopathy. No pelvic sidewall lymphadenopathy.  Reproductive: Uterus surgically absent.  There is no adnexal mass. Other: No intraperitoneal free fluid. Musculoskeletal: Mixed lytic and sclerotic lesion posterior right iliac bone is similar to prior. IMPRESSION: 1. Clear progression of the left lower lobe pulmonary nodule, measuring 2.7 x 1.9 cm today compared to 1.5 x 1.0 cm previously. No new suspicious pulmonary nodule or mass. 2. No evidence for metastatic disease in the abdomen or pelvis. 3. Mixed lytic and sclerotic lesion posterior right iliac bone is similar to prior. This was noted to have increased activity on bone scan 04/06/2021 consistent with metastatic involvement 4. Tiny hiatal hernia. 5. Aortic Atherosclerosis (ICD10-I70.0) and Emphysema (ICD10-J43.9). Soft tissue Electronically Signed   By: Misty Stanley M.D.   On: 07/10/2021 08:39     Assessment and plan- Patient is a 75 y.o. 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  She is here for on treatment assessment prior to cycle 5 of palliative Tecentriq  I have reviewed CT chest abdomen pelvis as well as bone scan results independently and discussed findings with patient and her daughter and son.  We are not seeing any findings ofDistant metastatic disease and the iliac bone lesion is stable.  However there has been no growth in the size of the left lower lobe lung nodule from 1.5 x 1 cm to 2.7 x 2.9 cm.  I have discussed her case with radiation oncology Dr. Donella Stade and he is willing to consider SBRT to this area.  Patient will proceed with Tecentriq today and see covering NP in 3 weeks for cycle 6 of palliative Tecentriq.  Since she is only getting SBRT for 5 days we do not have to hold Tecentriq at this time.  I will see her in 6 weeks for cycle 7 of Tecentriq and get MRI brain with and without contrast prior  Hyponatremia chronic continue to monitor   Visit Diagnosis 1. Lung cancer metastatic to  brain ([place])   2. Small cell lung cancer in adult ([place])   3. Encounter for antineoplastic immunotherapy      Dr. Randa Evens, MD, MPH [facility] [phone] 07/14/2021 3:16 PM

## 2021-07-14 NOTE — Patient Instructions (Signed)
[facility] ONCOLOGY  Discharge Instructions: Thank you for choosing [facility] to provide your oncology and hematology care.  If you have a lab appointment with the [facility], please go directly to the [facility] and check in at the registration area.  Wear comfortable clothing and clothing appropriate for easy access to any Portacath or PICC line.   We strive to give you quality time with your provider. You may need to reschedule your appointment if you arrive late (15 or more minutes).  Arriving late affects you and other patients whose appointments are after yours.  Also, if you miss three or more appointments without notifying the office, you may be dismissed from the clinic at the provider's discretion.      For prescription refill requests, have your pharmacy contact our office and allow 72 hours for refills to be completed.    Today you received the following chemotherapy and/or immunotherapy agents       To help prevent nausea and vomiting after your treatment, we encourage you to take your nausea medication as directed.  BELOW ARE SYMPTOMS THAT SHOULD BE REPORTED IMMEDIATELY: *FEVER GREATER THAN 100.4 F (38 C) OR HIGHER *CHILLS OR SWEATING *NAUSEA AND VOMITING THAT IS NOT CONTROLLED WITH YOUR NAUSEA MEDICATION *UNUSUAL SHORTNESS OF BREATH *UNUSUAL BRUISING OR BLEEDING *URINARY PROBLEMS (pain or burning when urinating, or frequent urination) *BOWEL PROBLEMS (unusual diarrhea, constipation, pain near the anus) TENDERNESS IN MOUTH AND THROAT WITH OR WITHOUT PRESENCE OF ULCERS (sore throat, sores in mouth, or a toothache) UNUSUAL RASH, SWELLING OR PAIN  UNUSUAL VAGINAL DISCHARGE OR ITCHING   Items with * indicate a potential emergency and should be followed up as soon as possible or go to the Emergency Department if any problems should occur.  Please show the CHEMOTHERAPY ALERT CARD or IMMUNOTHERAPY ALERT CARD at check-in to the  Emergency Department and triage nurse.  Should you have questions after your visit or need to cancel or reschedule your appointment, please contact [facility]  [phone] and follow the prompts.  Office hours are 8:00 a.m. to 4:30 p.m. Monday - Friday. Please note that voicemails left after 4:00 p.m. may not be returned until the following business day.  We are closed weekends and major holidays. You have access to a nurse at all times for urgent questions. Please call the main number to the clinic [phone] and follow the prompts.  For any non-urgent questions, you may also contact your provider using MyChart. We now offer e-Visits for anyone 29 and older to request care online for non-urgent symptoms. For details visit [email or web address].   Also download the MyChart app! Go to the app store, search "MyChart", open the app, select [facility], and log in with your MyChart username and password.  Due to Covid, a mask is required upon entering the hospital/clinic. If you do not have a mask, one will be given to you upon arrival. For doctor visits, patients may have 1 support person aged 59 or older with them. For treatment visits, patients cannot have anyone with them due to current Covid guidelines and our immunocompromised population.

## 2021-07-14 NOTE — Progress Notes (Signed)
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  Patient is receiving tecentriq. Planning to start radiation treatments for progression of left lower lung nodule.    Met with patient during infusion.  Patient reports that her appetite is good.  Eats about 2 1/2 meals per day.  Lunch is biggest meal of the day.  Continues to drink oral nutrition supplements.      Medications: reviewed  Labs: reviewed  Anthropometrics:   Weight 151 lb 3.2 oz today  149 lb 6.4 oz on 10/11 139 lb on 8/9 145 lb on 7/5 140 lb on 6/28 139 lb on 5/31   NUTRITION DIAGNOSIS: Inadequate oral intake improving   INTERVENTION: With increased appetite and weight can wean off oral nutrition supplements.   Encouraged patient to eat well balanced diet including lean protein and incorporating plant foods.     MONITORING, EVALUATION, GOAL: weight trends, intake   NEXT VISIT: Tuesday, Dec 13 during infusion  Lorrayne Ismael B. Zenia Resides, [place], [place] Registered Dietitian [phone] (mobile)

## 2021-07-15 ENCOUNTER — Encounter: Payer: Self-pay | Admitting: Oncology

## 2021-07-21 ENCOUNTER — Other Ambulatory Visit: Payer: Self-pay | Admitting: Oncology

## 2021-07-22 ENCOUNTER — Encounter: Payer: Self-pay | Admitting: Oncology

## 2021-07-23 ENCOUNTER — Telehealth: Payer: Self-pay | Admitting: *Deleted

## 2021-07-23 ENCOUNTER — Encounter: Payer: Self-pay | Admitting: *Deleted

## 2021-07-23 ENCOUNTER — Other Ambulatory Visit: Payer: Self-pay

## 2021-07-23 ENCOUNTER — Inpatient Hospital Stay: Payer: Medicare Other | Attending: Oncology

## 2021-07-23 ENCOUNTER — Ambulatory Visit
Admission: RE | Admit: 2021-07-23 | Discharge: 2021-07-23 | Disposition: A | Discharge status: Home / Self Care | Payer: Medicare Other | Source: Ambulatory Visit | Attending: Radiation Oncology | Admitting: Radiation Oncology

## 2021-07-23 ENCOUNTER — Encounter: Payer: Self-pay | Admitting: Oncology

## 2021-07-23 ENCOUNTER — Other Ambulatory Visit: Payer: Self-pay | Admitting: *Deleted

## 2021-07-23 DIAGNOSIS — Z801 Family history of malignant neoplasm of trachea, bronchus and lung: Secondary | ICD-10-CM | POA: Insufficient documentation

## 2021-07-23 DIAGNOSIS — C3490 Malignant neoplasm of unspecified part of unspecified bronchus or lung: Secondary | ICD-10-CM | POA: Insufficient documentation

## 2021-07-23 DIAGNOSIS — Z510 Encounter for antineoplastic radiation therapy: Secondary | ICD-10-CM | POA: Diagnosis present

## 2021-07-23 DIAGNOSIS — Z885 Allergy status to narcotic agent status: Secondary | ICD-10-CM | POA: Insufficient documentation

## 2021-07-23 DIAGNOSIS — Z8719 Personal history of other diseases of the digestive system: Secondary | ICD-10-CM | POA: Insufficient documentation

## 2021-07-23 DIAGNOSIS — I700 Atherosclerosis of aorta: Secondary | ICD-10-CM | POA: Insufficient documentation

## 2021-07-23 DIAGNOSIS — Z5112 Encounter for antineoplastic immunotherapy: Secondary | ICD-10-CM | POA: Insufficient documentation

## 2021-07-23 DIAGNOSIS — Z880 Allergy status to penicillin: Secondary | ICD-10-CM | POA: Insufficient documentation

## 2021-07-23 DIAGNOSIS — K449 Diaphragmatic hernia without obstruction or gangrene: Secondary | ICD-10-CM | POA: Insufficient documentation

## 2021-07-23 DIAGNOSIS — C7931 Secondary malignant neoplasm of brain: Secondary | ICD-10-CM | POA: Insufficient documentation

## 2021-07-23 DIAGNOSIS — Z882 Allergy status to sulfonamides status: Secondary | ICD-10-CM | POA: Insufficient documentation

## 2021-07-23 DIAGNOSIS — Z79899 Other long term (current) drug therapy: Secondary | ICD-10-CM | POA: Insufficient documentation

## 2021-07-23 DIAGNOSIS — Z881 Allergy status to other antibiotic agents status: Secondary | ICD-10-CM | POA: Insufficient documentation

## 2021-07-23 DIAGNOSIS — R300 Dysuria: Secondary | ICD-10-CM

## 2021-07-23 DIAGNOSIS — Z8049 Family history of malignant neoplasm of other genital organs: Secondary | ICD-10-CM | POA: Insufficient documentation

## 2021-07-23 DIAGNOSIS — Z818 Family history of other mental and behavioral disorders: Secondary | ICD-10-CM | POA: Insufficient documentation

## 2021-07-23 DIAGNOSIS — C3432 Malignant neoplasm of lower lobe, left bronchus or lung: Secondary | ICD-10-CM | POA: Insufficient documentation

## 2021-07-23 DIAGNOSIS — E871 Hypo-osmolality and hyponatremia: Secondary | ICD-10-CM | POA: Insufficient documentation

## 2021-07-23 DIAGNOSIS — Z8349 Family history of other endocrine, nutritional and metabolic diseases: Secondary | ICD-10-CM | POA: Insufficient documentation

## 2021-07-23 DIAGNOSIS — R350 Frequency of micturition: Secondary | ICD-10-CM | POA: Insufficient documentation

## 2021-07-23 DIAGNOSIS — C7951 Secondary malignant neoplasm of bone: Secondary | ICD-10-CM | POA: Insufficient documentation

## 2021-07-23 DIAGNOSIS — F17210 Nicotine dependence, cigarettes, uncomplicated: Secondary | ICD-10-CM | POA: Insufficient documentation

## 2021-07-23 DIAGNOSIS — J432 Centrilobular emphysema: Secondary | ICD-10-CM | POA: Insufficient documentation

## 2021-07-23 DIAGNOSIS — E876 Hypokalemia: Secondary | ICD-10-CM | POA: Insufficient documentation

## 2021-07-23 DIAGNOSIS — Z8249 Family history of ischemic heart disease and other diseases of the circulatory system: Secondary | ICD-10-CM | POA: Insufficient documentation

## 2021-07-23 DIAGNOSIS — Z888 Allergy status to other drugs, medicaments and biological substances status: Secondary | ICD-10-CM | POA: Insufficient documentation

## 2021-07-23 DIAGNOSIS — Z923 Personal history of irradiation: Secondary | ICD-10-CM | POA: Insufficient documentation

## 2021-07-23 DIAGNOSIS — R42 Dizziness and giddiness: Secondary | ICD-10-CM | POA: Insufficient documentation

## 2021-07-23 LAB — URINALYSIS, COMPLETE (UACMP) WITH MICROSCOPIC
Bilirubin Urine: NEGATIVE
Glucose, UA: NEGATIVE mg/dL
Ketones, ur: NEGATIVE mg/dL
Nitrite: NEGATIVE
Protein, ur: NEGATIVE mg/dL
Specific Gravity, Urine: 1.01 (ref 1.005–1.030)
pH: 6 (ref 5.0–8.0)

## 2021-07-23 MED ORDER — NITROFURANTOIN MONOHYD MACRO 100 MG PO CAPS: 100.0000 mg | ORAL_CAPSULE | Freq: Two times a day (BID) | ORAL | 0 refills | Status: DC

## 2021-07-23 NOTE — Addendum Note (Signed)
Addended by: Luella Cook on: 07/23/2021 05:25 PM   Modules accepted: Orders

## 2021-07-23 NOTE — Telephone Encounter (Signed)
Patient called triage. She reports burning with urination. She is requesting a u/a and culture to be added. She stated that doctor normally send her in an antibiotics to help with her UTI symptoms. She denies any fever.  Msg sent to Dr. Janese Banks- ok to add u/a and urine culture today. Pt stated that she can come at 130pm today.

## 2021-07-24 LAB — URINE CULTURE

## 2021-07-28 DIAGNOSIS — Z510 Encounter for antineoplastic radiation therapy: Secondary | ICD-10-CM | POA: Diagnosis not present

## 2021-08-02 ENCOUNTER — Other Ambulatory Visit: Payer: Self-pay | Admitting: Oncology

## 2021-08-03 ENCOUNTER — Ambulatory Visit
Admission: RE | Admit: 2021-08-03 | Discharge: 2021-08-03 | Disposition: A | Discharge status: Home / Self Care | Payer: Medicare Other | Source: Ambulatory Visit | Attending: Radiation Oncology | Admitting: Radiation Oncology

## 2021-08-03 DIAGNOSIS — Z510 Encounter for antineoplastic radiation therapy: Secondary | ICD-10-CM | POA: Diagnosis not present

## 2021-08-03 NOTE — Telephone Encounter (Signed)
Component Ref Range & Units 2 wk ago  (07/14/21) 1 mo ago  (06/23/21) 2 mo ago  (06/02/21) 2 mo ago  (05/12/21) 3 mo ago  (04/14/21) 3 mo ago  (04/09/21) 3 mo ago  (04/07/21)  Potassium 3.5 - 5.1 mmol/L 3.5  3.6  3.5

## 2021-08-03 NOTE — Telephone Encounter (Signed)
I will wait to refill until I see her tomorrow.   Faythe Casa, NP 08/03/2021 12:53 PM

## 2021-08-04 ENCOUNTER — Encounter: Payer: Self-pay | Admitting: Oncology

## 2021-08-04 ENCOUNTER — Inpatient Hospital Stay: Payer: Medicare Other

## 2021-08-04 ENCOUNTER — Inpatient Hospital Stay (HOSPITAL_BASED_OUTPATIENT_CLINIC_OR_DEPARTMENT_OTHER): Payer: Medicare Other | Admitting: Oncology

## 2021-08-04 ENCOUNTER — Other Ambulatory Visit: Payer: Self-pay

## 2021-08-04 VITALS — BP 145/66 | HR 88 | Temp 99.5°F | Ht 67.0 in | Wt 151.4 lb

## 2021-08-04 DIAGNOSIS — Z8349 Family history of other endocrine, nutritional and metabolic diseases: Secondary | ICD-10-CM | POA: Diagnosis not present

## 2021-08-04 DIAGNOSIS — R300 Dysuria: Secondary | ICD-10-CM | POA: Diagnosis not present

## 2021-08-04 DIAGNOSIS — C3490 Malignant neoplasm of unspecified part of unspecified bronchus or lung: Secondary | ICD-10-CM

## 2021-08-04 DIAGNOSIS — Z818 Family history of other mental and behavioral disorders: Secondary | ICD-10-CM | POA: Insufficient documentation

## 2021-08-04 DIAGNOSIS — Z5112 Encounter for antineoplastic immunotherapy: Secondary | ICD-10-CM | POA: Diagnosis present

## 2021-08-04 DIAGNOSIS — I700 Atherosclerosis of aorta: Secondary | ICD-10-CM | POA: Insufficient documentation

## 2021-08-04 DIAGNOSIS — F17210 Nicotine dependence, cigarettes, uncomplicated: Secondary | ICD-10-CM | POA: Diagnosis not present

## 2021-08-04 DIAGNOSIS — Z79899 Other long term (current) drug therapy: Secondary | ICD-10-CM | POA: Diagnosis not present

## 2021-08-04 DIAGNOSIS — Z882 Allergy status to sulfonamides status: Secondary | ICD-10-CM | POA: Insufficient documentation

## 2021-08-04 DIAGNOSIS — C7951 Secondary malignant neoplasm of bone: Secondary | ICD-10-CM | POA: Diagnosis not present

## 2021-08-04 DIAGNOSIS — Z881 Allergy status to other antibiotic agents status: Secondary | ICD-10-CM | POA: Insufficient documentation

## 2021-08-04 DIAGNOSIS — C3432 Malignant neoplasm of lower lobe, left bronchus or lung: Secondary | ICD-10-CM | POA: Diagnosis present

## 2021-08-04 DIAGNOSIS — Z885 Allergy status to narcotic agent status: Secondary | ICD-10-CM | POA: Diagnosis not present

## 2021-08-04 DIAGNOSIS — Z8049 Family history of malignant neoplasm of other genital organs: Secondary | ICD-10-CM | POA: Insufficient documentation

## 2021-08-04 DIAGNOSIS — E876 Hypokalemia: Secondary | ICD-10-CM | POA: Diagnosis not present

## 2021-08-04 DIAGNOSIS — Z923 Personal history of irradiation: Secondary | ICD-10-CM | POA: Diagnosis not present

## 2021-08-04 DIAGNOSIS — Z8719 Personal history of other diseases of the digestive system: Secondary | ICD-10-CM | POA: Diagnosis not present

## 2021-08-04 DIAGNOSIS — J432 Centrilobular emphysema: Secondary | ICD-10-CM | POA: Diagnosis not present

## 2021-08-04 DIAGNOSIS — E871 Hypo-osmolality and hyponatremia: Secondary | ICD-10-CM | POA: Diagnosis not present

## 2021-08-04 DIAGNOSIS — Z888 Allergy status to other drugs, medicaments and biological substances status: Secondary | ICD-10-CM | POA: Insufficient documentation

## 2021-08-04 DIAGNOSIS — R350 Frequency of micturition: Secondary | ICD-10-CM | POA: Insufficient documentation

## 2021-08-04 DIAGNOSIS — Z8249 Family history of ischemic heart disease and other diseases of the circulatory system: Secondary | ICD-10-CM | POA: Insufficient documentation

## 2021-08-04 DIAGNOSIS — Z880 Allergy status to penicillin: Secondary | ICD-10-CM | POA: Diagnosis not present

## 2021-08-04 DIAGNOSIS — K449 Diaphragmatic hernia without obstruction or gangrene: Secondary | ICD-10-CM | POA: Diagnosis not present

## 2021-08-04 DIAGNOSIS — C7931 Secondary malignant neoplasm of brain: Secondary | ICD-10-CM | POA: Diagnosis not present

## 2021-08-04 DIAGNOSIS — R42 Dizziness and giddiness: Secondary | ICD-10-CM | POA: Insufficient documentation

## 2021-08-04 DIAGNOSIS — Z801 Family history of malignant neoplasm of trachea, bronchus and lung: Secondary | ICD-10-CM | POA: Diagnosis not present

## 2021-08-04 LAB — CBC WITH DIFFERENTIAL/PLATELET
Abs Immature Granulocytes: 0.08 10*3/uL — ABNORMAL HIGH (ref 0.00–0.07)
Basophils Absolute: 0 10*3/uL (ref 0.0–0.1)
Basophils Relative: 1 %
Eosinophils Absolute: 0 10*3/uL (ref 0.0–0.5)
Eosinophils Relative: 1 %
HCT: 31.7 % — ABNORMAL LOW (ref 36.0–46.0)
Hemoglobin: 11.1 g/dL — ABNORMAL LOW (ref 12.0–15.0)
Immature Granulocytes: 1 %
Lymphocytes Relative: 8 %
Lymphs Abs: 0.5 10*3/uL — ABNORMAL LOW (ref 0.7–4.0)
MCH: 32.2 pg (ref 26.0–34.0)
MCHC: 35 g/dL (ref 30.0–36.0)
MCV: 91.9 fL (ref 80.0–100.0)
Monocytes Absolute: 0.6 10*3/uL (ref 0.1–1.0)
Monocytes Relative: 9 %
Neutro Abs: 5.1 10*3/uL (ref 1.7–7.7)
Neutrophils Relative %: 80 %
Platelets: 126 10*3/uL — ABNORMAL LOW (ref 150–400)
RBC: 3.45 MIL/uL — ABNORMAL LOW (ref 3.87–5.11)
RDW: 14 % (ref 11.5–15.5)
WBC: 6.3 10*3/uL (ref 4.0–10.5)
nRBC: 0 % (ref 0.0–0.2)

## 2021-08-04 LAB — URINALYSIS, COMPLETE (UACMP) WITH MICROSCOPIC
Bacteria, UA: NONE SEEN
Bilirubin Urine: NEGATIVE
Glucose, UA: NEGATIVE mg/dL
Ketones, ur: NEGATIVE mg/dL
Leukocytes,Ua: NEGATIVE
Nitrite: NEGATIVE
Protein, ur: NEGATIVE mg/dL
Specific Gravity, Urine: 1.015 (ref 1.005–1.030)
pH: 6.5 (ref 5.0–8.0)

## 2021-08-04 LAB — COMPREHENSIVE METABOLIC PANEL
ALT: 16 U/L (ref 0–44)
AST: 18 U/L (ref 15–41)
Albumin: 3.6 g/dL (ref 3.5–5.0)
Alkaline Phosphatase: 49 U/L (ref 38–126)
Anion gap: 12 (ref 5–15)
BUN: 11 mg/dL (ref 8–23)
CO2: 23 mmol/L (ref 22–32)
Calcium: 8.7 mg/dL — ABNORMAL LOW (ref 8.9–10.3)
Chloride: 91 mmol/L — ABNORMAL LOW (ref 98–111)
Creatinine, Ser: 0.94 mg/dL (ref 0.44–1.00)
GFR, Estimated: >60 mL/min (ref >60)
Glucose, Bld: 135 mg/dL — ABNORMAL HIGH (ref 70–99)
Potassium: 3.2 mmol/L — ABNORMAL LOW (ref 3.5–5.1)
Sodium: 126 mmol/L — ABNORMAL LOW (ref 135–145)
Total Bilirubin: 0.6 mg/dL (ref 0.3–1.2)
Total Protein: 6.5 g/dL (ref 6.5–8.1)

## 2021-08-04 LAB — TSH: TSH: 0.918 u[IU]/mL (ref 0.350–4.500)

## 2021-08-04 MED ORDER — SODIUM CHLORIDE 0.9 % IV SOLN
1200.0000 mg | Freq: Once | INTRAVENOUS | Status: AC
  Administered 2021-08-04: 1200 mg via INTRAVENOUS
  Filled 2021-08-04: qty 20

## 2021-08-04 MED ORDER — HEPARIN SOD (PORK) LOCK FLUSH 100 UNIT/ML IV SOLN
500.0000 [IU] | Freq: Once | INTRAVENOUS | Status: AC
  Filled 2021-08-04: qty 5

## 2021-08-04 MED ORDER — POTASSIUM CHLORIDE CRYS ER 20 MEQ PO TBCR: 20.0000 meq | EXTENDED_RELEASE_TABLET | Freq: Two times a day (BID) | ORAL | 0 refills | Status: DC

## 2021-08-04 MED ORDER — HEPARIN SOD (PORK) LOCK FLUSH 100 UNIT/ML IV SOLN
500.0000 [IU] | Freq: Once | INTRAVENOUS | Status: DC | PRN
  Filled 2021-08-04: qty 5

## 2021-08-04 MED ORDER — HEPARIN SOD (PORK) LOCK FLUSH 100 UNIT/ML IV SOLN
INTRAVENOUS | Status: AC
  Administered 2021-08-04: 500 [IU] via INTRAVENOUS
  Filled 2021-08-04: qty 5

## 2021-08-04 MED ORDER — SODIUM CHLORIDE 0.9 % IV SOLN
Freq: Once | INTRAVENOUS | Status: AC
  Filled 2021-08-04: qty 250

## 2021-08-04 MED ORDER — SODIUM CHLORIDE 0.9% FLUSH
10.0000 mL | Freq: Once | INTRAVENOUS | Status: AC
  Administered 2021-08-04: 10 mL via INTRAVENOUS
  Filled 2021-08-04: qty 10

## 2021-08-04 NOTE — Progress Notes (Signed)
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  She underwent CT chest with contrast in March 2022 following an abnormal x-ray which showed a 2.6 cm mass in the left lower lobe.  This was followed by a PET CT scan which showed a 3.0 x 2.0 cm mass in the left lower lobe of the lung with an SUV of 9.7.  No evidence of locoregional adenopathy.  No evidence of intra-abdominal disease with patient was noted to have a hypermetabolic focus in the right iliac bone with an SUV of 11.1 which was suspicious for metastatic disease.  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  Cells were positive for TTF-1, CD56 and chromogranin.  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  Patient completed whole brain radiation treatment and completed carbo etoposide Tecentriq 4 cycles on 03/10/2021.  Interval history-patient reports overall doing well.  She began  lung radiation yesterday and describes feeling "bad" last night.  States she felt dizzy and weak with improvement of her symptoms this morning.  Does have some intermittent underlying dizziness and weakness since beginning treatment but it was worse last night.  Denies any pain.  Has been compliant with her salt tablets for chronic hyponatremia.  Feels she may be developing a UTI due to increased urinary frequency and dysuria.  Would like to have a urinalysis completed today.  ECOG PS- 2 Pain scale- 0   Review of systems- Review of Systems  Constitutional: Negative.  Negative for chills, fever, malaise/fatigue and weight loss.  HENT:  Negative for congestion, ear pain and tinnitus.   Eyes: Negative.  Negative for blurred vision and double vision.  Respiratory: Negative.  Negative for cough, sputum production and shortness of breath.   Cardiovascular: Negative.  Negative for chest pain, palpitations and leg swelling.  Gastrointestinal: Negative.  Negative for abdominal pain, constipation, diarrhea, nausea and vomiting.  Genitourinary:  Negative for dysuria, frequency and urgency.  Musculoskeletal:  Negative for back pain and falls.  Skin: Negative.  Negative for rash.  Neurological:  Positive for dizziness and weakness. Negative for headaches.  Endo/Heme/Allergies: Negative.  Does not bruise/bleed easily.  Psychiatric/Behavioral: Negative.  Negative for depression. The patient is not nervous/anxious and does not have insomnia.      Allergies  Allergen Reactions   Augmentin [Amoxicillin-Pot Clavulanate]     "messes up my blood cells"   Codeine Nausea Only   Levaquin [Levofloxacin]     Delusions    Sulfa Antibiotics Nausea And Vomiting   Vancomycin Other (  See Comments)    Harle Battiest syndome- [facility] in the hospital [facility]   Tape Rash    Some bandaids cause rash sometimes     Past Medical History:  Diagnosis Date   Anxiety    Breathing problem    low breathing function 53%   COPD  (chronic obstructive pulmonary disease) (HCC)    EMPHYSEMA   Dysrhythmia    tachycardia   Emphysema of lung (HCC)    Hemorrhoid    History of hiatal hernia    SMALL- 2022   Hypercholesteremia    Hypertension    Hypothyroidism    Larynx polyp    Lung cancer (HCC)    Palpitations    with anxiety   Platelets decreased ([place]) 2020   Seasonal allergies    Tinnitus    Vertigo    no episodes in several years   Wears dentures    partial lower   White coat syndrome with hypertension      Past Surgical History:  Procedure Laterality Date   ABDOMINAL HYSTERECTOMY     partial   COLONOSCOPY WITH PROPOFOL N/A 01/22/2016   Procedure: COLONOSCOPY WITH PROPOFOL;  Surgeon: Lucilla Lame, MD;  Location: [place];  Service: Endoscopy;  Laterality: N/A;   COLONOSCOPY WITH PROPOFOL N/A 04/12/2019   Procedure: COLONOSCOPY WITH BIOPSY;  Surgeon: Lucilla Lame, MD;  Location: [place];  Service: Endoscopy;  Laterality: N/A;  pt would like an early appt   ct guided lung bx     IR IMAGING GUIDED PORT INSERTION  12/18/2020   POLYPECTOMY  01/22/2016   Procedure: POLYPECTOMY;  Surgeon: Lucilla Lame, MD;  Location: [place];  Service: Endoscopy;;   POLYPECTOMY N/A 04/12/2019   Procedure: POLYPECTOMY;  Surgeon: Lucilla Lame, MD;  Location: [place];  Service: Endoscopy;  Laterality: N/A;   VIDEO BRONCHOSCOPY WITH ENDOBRONCHIAL NAVIGATION N/A 11/21/2020   Procedure: VIDEO BRONCHOSCOPY WITH ENDOBRONCHIAL NAVIGATION;  Surgeon: Ottie Glazier, MD;  Location: [facility];  Service: Thoracic;  Laterality: N/A;   VIDEO BRONCHOSCOPY WITH ENDOBRONCHIAL ULTRASOUND N/A 11/21/2020   Procedure: VIDEO BRONCHOSCOPY WITH ENDOBRONCHIAL ULTRASOUND;  Surgeon: Ottie Glazier, MD;  Location: [facility];  Service: Thoracic;  Laterality: N/A;    Social History   Socioeconomic History   Marital status: Married    Spouse name: Not on file   Number of children: Not on file   Years of education:  Not on file   Highest education level: Not on file  Occupational History   Not on file  Tobacco Use   Smoking status: Former    Packs/day: 0.50    Years: 50.00    Pack years: 25.00    Types: Cigarettes    Quit date: 11/03/2020    Years since quitting: 0.7   Smokeless tobacco: Never  Vaping Use   Vaping Use: Some days  Substance and Sexual Activity   Alcohol use: No   Drug use: Never   Sexual activity: Not on file  Other Topics Concern   Not on file  Social History Narrative   Not on file   Social Determinants of Health   Financial Resource Strain: Not on file  Food Insecurity: Not on file  Transportation Needs: Not on file  Physical Activity: Not on file  Stress: Not on file  Social Connections: Not on file  Intimate Partner Violence: Not on file    Family History  Problem Relation Age of Onset   Alzheimer's disease Mother    Heart  attack Mother    High Cholesterol Mother    Hypertension Mother    Heart block Mother    Lung cancer Father    Uterine cancer Sister    Hypertension Sister    Heart block Sister    Anxiety disorder Sister    Breast cancer Neg Hx      Current Outpatient Medications:    acetaminophen (TYLENOL) 500 MG tablet, Take 500 mg by mouth every 6 (six) hours as needed for mild pain or moderate pain., Disp: , Rfl:    atorvastatin (LIPITOR) 10 MG tablet, TAKE 1 TABLET BY MOUTH DAILY, Disp: 90 tablet, Rfl: 0   ketotifen (ZADITOR) 0.025 % ophthalmic solution, Place 1 drop into both eyes 2 (two) times daily as needed., Disp: , Rfl:    levothyroxine (SYNTHROID, LEVOTHROID) 50 MCG tablet, Take 50 mcg by mouth daily before breakfast., Disp: , Rfl:    loperamide (IMODIUM) 2 MG capsule, Take 2 mg by mouth as needed for diarrhea or loose stools., Disp: , Rfl:    loratadine (CLARITIN) 10 MG tablet, Take 10 mg by mouth daily. Reported on 01/22/2016, Disp: , Rfl:    LORazepam (ATIVAN) 0.5 MG tablet, TAKE 1 TABLET(0.5 MG) BY MOUTH EVERY 6 HOURS AS NEEDED FOR  ANXIETY, Disp: 120 tablet, Rfl: 0   losartan-hydrochlorothiazide (HYZAAR) 100-12.5 MG tablet, , Disp: , Rfl:    magnesium chloride (SLOW-MAG) 64 MG TBEC SR tablet, Take 1 tablet (64 mg total) by mouth daily., Disp: 30 tablet, Rfl: 0   metoprolol succinate (TOPROL-XL) 50 MG 24 hr tablet, Take 50 mg by mouth daily., Disp: , Rfl:    potassium chloride SA (KLOR-CON M) 20 MEQ tablet, Take 1 tablet (20 mEq total) by mouth 2 (two) times daily., Disp: 10 tablet, Rfl: 0   potassium chloride SA (KLOR-CON) 20 MEQ tablet, TAKE 1 TABLET BY MOUTH DAILY, Disp: 28 tablet, Rfl: 0   QUEtiapine (SEROQUEL) 25 MG tablet, Take 1 tablet (25 mg total) by mouth at bedtime., Disp: 30 tablet, Rfl: 2   sodium chloride 1 g tablet, Take 1 tablet (1 g total) by mouth 4 (four) times daily., Disp: 120 tablet, Rfl: 0   TRELEGY ELLIPTA 100-62.5-25 MCG/ACT AEPB, INHALE 1 PUFF INTO THE LUNGS DAILY, Disp: 60 each, Rfl: 3   nitrofurantoin, macrocrystal-monohydrate, (MACROBID) 100 MG capsule, Take 1 capsule (100 mg total) by mouth 2 (two) times daily., Disp: 10 capsule, Rfl: 0   ondansetron (ZOFRAN) 8 MG tablet, TAKE 1 TABLET BY MOUTH TWICE DAILY AS NEEDED FOR REFRACTORY NAUSEA/ VOMITING. START ON DAY 3 AFTER CARBOPLATIN CHEMO, Disp: 30 tablet, Rfl: 3   sertraline (ZOLOFT) 50 MG tablet, Take 50 mg by mouth daily., Disp: , Rfl:    sertraline (ZOLOFT) 50 MG tablet, Take 1 tablet by mouth at bedtime., Disp: , Rfl:  No current facility-administered medications for this visit.  Facility-Administered Medications Ordered in Other Visits:    0.9 %  sodium chloride infusion, , Intravenous, Once, Earlie Server, MD  Physical exam:  Vitals:   08/04/21 1330  BP: (!) 145/66  Pulse: 88  Temp: 99.5 F (37.5 C)  TempSrc: Tympanic  SpO2: 100%  Weight: 151 lb 6.4 oz (68.7 kg)  Height: 5\' 7"  (1.702 m)   Physical Exam Constitutional:      Appearance: Normal appearance.  HENT:     Head: Normocephalic and atraumatic.  Eyes:     Pupils: Pupils are  equal, round, and reactive to light.  Cardiovascular:     Rate  and Rhythm: Normal rate and regular rhythm.     Heart sounds: Normal heart sounds. No murmur heard. Pulmonary:     Effort: Pulmonary effort is normal.     Breath sounds: Normal breath sounds. No wheezing.  Abdominal:     General: Bowel sounds are normal. There is no distension.     Palpations: Abdomen is soft.     Tenderness: There is no abdominal tenderness.  Musculoskeletal:        General: Normal range of motion.     Cervical back: Normal range of motion.  Skin:    General: Skin is warm and dry.     Findings: No rash.  Neurological:     Mental Status: She is alert and oriented to person, place, and time.  Psychiatric:        Judgment: Judgment normal.     CMP Latest Ref Rng & Units 08/04/2021  Glucose 70 - 99 mg/dL 135(H)  BUN 8 - 23 mg/dL 11  Creatinine 0.44 - 1.00 mg/dL 0.94  Sodium 135 - 145 mmol/L 126(L)  Potassium 3.5 - 5.1 mmol/L 3.2(L)  Chloride 98 - 111 mmol/L 91(L)  CO2 22 - 32 mmol/L 23  Calcium 8.9 - 10.3 mg/dL 8.7(L)  Total Protein 6.5 - 8.1 g/dL 6.5  Total Bilirubin 0.3 - 1.2 mg/dL 0.6  Alkaline Phos 38 - 126 U/L 49  AST 15 - 41 U/L 18  ALT 0 - 44 U/L 16   CBC Latest Ref Rng & Units 08/04/2021  WBC 4.0 - 10.5 K/uL 6.3  Hemoglobin 12.0 - 15.0 g/dL 11.1(L)  Hematocrit 36.0 - 46.0 % 31.7(L)  Platelets 150 - 400 K/uL 126(L)    No images are attached to the encounter.  NM Bone Scan Whole Body  Result Date: 07/07/2021 CLINICAL DATA:  Small cell lung cancer with bone metastases. EXAM: NUCLEAR MEDICINE WHOLE BODY BONE SCAN TECHNIQUE: Whole body anterior and posterior images were obtained approximately 3 hours after intravenous injection of radiopharmaceutical. RADIOPHARMACEUTICALS:  22.06 mCi Technetium-56m MDP IV COMPARISON:  Bone scan 04/06/2021. FINDINGS: Focal increased activity in the medial right iliac wing is again seen. No new abnormal radiotracer activity is evident. Additional activity  consistent with degenerative changes in the shoulders and lumbar spine, and both wrists is again noted. Renal and bladder activity are unremarkable. IMPRESSION: Unchanged uptake abnormality in the medial right iliac wing. No other focal abnormal activity. Electronically Signed   By: Telford Nab M.D.   On: 07/07/2021 03:52   CT CHEST ABDOMEN PELVIS W CONTRAST  Result Date: 07/10/2021 CLINICAL DATA:  Small-cell lung cancer.  Restaging. EXAM: CT CHEST, ABDOMEN, AND PELVIS WITH CONTRAST TECHNIQUE: Multidetector CT imaging of the chest, abdomen and pelvis was performed following the standard protocol during bolus administration of intravenous contrast. CONTRAST:  131mL OMNIPAQUE IOHEXOL 300 MG/ML  SOLN COMPARISON:  Chest CT 03/30/2021.  Abdomen/pelvis CT 03/25/2021. FINDINGS: CT CHEST FINDINGS Cardiovascular: The heart size is normal. No substantial pericardial effusion. Coronary artery calcification is evident. Moderate atherosclerotic calcification is noted in the wall of the thoracic aorta. Right Port-A-Cath tip is positioned in the upper right atrium. Mediastinum/Nodes: No mediastinal lymphadenopathy. There is no hilar lymphadenopathy. The esophagus has normal imaging features. There is no axillary lymphadenopathy. Lungs/Pleura: Centrilobular and paraseptal emphysema evident. Clear progression of the left lower lobe pulmonary nodule, measuring 2.7 x 1.9 cm today compared to 1.5 x 1.0 cm previously. No new suspicious pulmonary nodule or mass. No focal airspace consolidation. No pleural effusion. Musculoskeletal: No  worrisome lytic or sclerotic osseous abnormality. CT ABDOMEN PELVIS FINDINGS Hepatobiliary: No suspicious focal abnormality within the liver parenchyma. There is no evidence for gallstones, gallbladder wall thickening, or pericholecystic fluid. No intrahepatic or extrahepatic biliary dilation. Pancreas: No focal mass lesion. No dilatation of the main duct. No intraparenchymal cyst. No peripancreatic  edema. Spleen: No splenomegaly. No focal mass lesion. Adrenals/Urinary Tract: No adrenal nodule or mass. Kidneys unremarkable. 6 mm hypoattenuating lesion upper pole left kidney is stable, too small to characterize but most likely benign. No evidence for hydroureter. Bladder is decompressed. Stomach/Bowel: Tiny hiatal hernia. Stomach unremarkable. Duodenum is normally positioned as is the ligament of Treitz. No small bowel wall thickening. No small bowel dilatation. The terminal ileum is normal. No gross colonic mass. No colonic wall thickening. Vascular/Lymphatic: There is advanced atherosclerotic calcification of the abdominal aorta without aneurysm. There is no gastrohepatic or hepatoduodenal ligament lymphadenopathy. No retroperitoneal or mesenteric lymphadenopathy. No pelvic sidewall lymphadenopathy. Reproductive: Uterus surgically absent.  There is no adnexal mass. Other: No intraperitoneal free fluid. Musculoskeletal: Mixed lytic and sclerotic lesion posterior right iliac bone is similar to prior. IMPRESSION: 1. Clear progression of the left lower lobe pulmonary nodule, measuring 2.7 x 1.9 cm today compared to 1.5 x 1.0 cm previously. No new suspicious pulmonary nodule or mass. 2. No evidence for metastatic disease in the abdomen or pelvis. 3. Mixed lytic and sclerotic lesion posterior right iliac bone is similar to prior. This was noted to have increased activity on bone scan 04/06/2021 consistent with metastatic involvement 4. Tiny hiatal hernia. 5. Aortic Atherosclerosis (ICD10-I70.0) and Emphysema (ICD10-J43.9). Soft tissue Electronically Signed   By: Misty Stanley M.D.   On: 07/10/2021 08:39     Assessment and plan- Patient is a 75 y.o. 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  She is here for assessment prior to cycle 10 of Tecentriq.  Discussed labs from today.  Acceptable for treatment.  She started SBRT  yesterday and will have her second dose tomorrow- Total 5 treatment.  Has chronic hyponatremia with sodium of 126 today.  Previously 128.  She is currently taking salt tablets for daily and has been compliant.  Not symptomatic although sodium levels trending down.  Recommend she increase salt intake.  Hypokalemia- Increase potassium intake and 5 days worth of 20 mEq potassium twice daily.  Dysuria/increased urinary frequency- Will check UA today.  UA not consistent with UTI although there is a trace of blood.  We will wait for culture to result.  Dizziness/weakness- Continue to monitor at this time.  Blood pressure stable today.  I spent 20 minutes dedicated to the care of this patient (face-to-face and non-face-to-face) on the date of the encounter to include what is described in the assessment and plan.  Visit Diagnosis 1. Dysuria   2. Small cell lung cancer in adult ([place])   3. Hyponatremia    Faythe Casa, NP 08/04/2021 4:21 PM

## 2021-08-04 NOTE — Progress Notes (Signed)
Pt states she started radiation yesterday and hasn't felt good since. Also, needs refill of her metoprolol.

## 2021-08-04 NOTE — Patient Instructions (Signed)
[facility] [place]  Discharge Instructions: Thank you for choosing [facility] to provide your oncology and hematology care.  If you have a lab appointment with the [facility], please go directly to the [facility] and check in at the registration area.  Wear comfortable clothing and clothing appropriate for easy access to any Portacath or PICC line.   We strive to give you quality time with your provider. You may need to reschedule your appointment if you arrive late (15 or more minutes).  Arriving late affects you and other patients whose appointments are after yours.  Also, if you miss three or more appointments without notifying the office, you may be dismissed from the clinic at the providers discretion.      For prescription refill requests, have your pharmacy contact our office and allow 72 hours for refills to be completed.    Today you received the following chemotherapy and/or immunotherapy agents tecentriq    To help prevent nausea and vomiting after your treatment, we encourage you to take your nausea medication as directed.  BELOW ARE SYMPTOMS THAT SHOULD BE REPORTED IMMEDIATELY: *FEVER GREATER THAN 100.4 F (38 C) OR HIGHER *CHILLS OR SWEATING *NAUSEA AND VOMITING THAT IS NOT CONTROLLED WITH YOUR NAUSEA MEDICATION *UNUSUAL SHORTNESS OF BREATH *UNUSUAL BRUISING OR BLEEDING *URINARY PROBLEMS (pain or burning when urinating, or frequent urination) *BOWEL PROBLEMS (unusual diarrhea, constipation, pain near the anus) TENDERNESS IN MOUTH AND THROAT WITH OR WITHOUT PRESENCE OF ULCERS (sore throat, sores in mouth, or a toothache) UNUSUAL RASH, SWELLING OR PAIN  UNUSUAL VAGINAL DISCHARGE OR ITCHING   Items with * indicate a potential emergency and should be followed up as soon as possible or go to the Emergency Department if any problems should occur.  Please show the CHEMOTHERAPY ALERT CARD or IMMUNOTHERAPY ALERT CARD at check-in to the  Emergency Department and triage nurse.  Should you have questions after your visit or need to cancel or reschedule your appointment, please contact [facility] [place] [facility] [place]  [phone] and follow the prompts.  Office hours are 8:00 a.m. to 4:30 p.m. Monday - Friday. Please note that voicemails left after 4:00 p.m. may not be returned until the following business day.  We are closed weekends and major holidays. You have access to a nurse at all times for urgent questions. Please call the main number to the clinic [phone] and follow the prompts.  For any non-urgent questions, you may also contact your provider using MyChart. We now offer e-Visits for anyone 32 and older to request care online for non-urgent symptoms. For details visit [email or web address].   Also download the MyChart app! Go to the app store, search "MyChart", open the app, select [facility], and log in with your MyChart username and password.  Due to Covid, a mask is required upon entering the hospital/clinic. If you do not have a mask, one will be given to you upon arrival. For doctor visits, patients may have 1 support person aged 58 or older with them. For treatment visits, patients cannot have anyone with them due to current Covid guidelines and our immunocompromised population.

## 2021-08-04 NOTE — Telephone Encounter (Signed)
Component Ref Range & Units 13:10  (08/04/21) 3 wk ago  (07/14/21) 1 mo ago  (06/23/21) 2 mo ago  (06/02/21) 2 mo ago  (05/12/21) 3 mo ago  (04/14/21) 3 mo ago  (04/09/21)  Potassium 3.5 - 5.1 mmol/L 3.2 Low   3.5  3.6  3.5  3.7  4.0  2.9 Low

## 2021-08-04 NOTE — Progress Notes (Signed)
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  Patient is receiving tecentriq.  Pt receiving radiation to left lower lung nodule.  Met with patient during infusion.  Appetite continues to be good.  Denies nutrition impact symptoms.      Medications: reviewed  Labs: reviewed  Anthropometrics:   Weight 151 lb today  151 lb on 11/22 149 lb on 10/11 139 lb on 8/9 145 lb on 7/5 140 lb on 6/28 139 lb on 5/31   NUTRITION DIAGNOSIS: Inadequate oral intake improving   INTERVENTION:  Encouraged well balanced diet including good sources of protein and plant foods Can stop oral nutrition supplements with improved appetite and weight    MONITORING, EVALUATION, GOAL: weight trends, intake   NEXT VISIT: ~ 6 weeks during treatment  Teddie Curd B. Zenia Resides, [place], [place] Registered Dietitian [phone] (mobile)

## 2021-08-05 ENCOUNTER — Ambulatory Visit: Payer: Medicare Other

## 2021-08-05 LAB — URINE CULTURE

## 2021-08-06 ENCOUNTER — Encounter: Payer: Self-pay | Admitting: Oncology

## 2021-08-06 ENCOUNTER — Ambulatory Visit
Admission: RE | Admit: 2021-08-06 | Discharge: 2021-08-06 | Disposition: A | Discharge status: Home / Self Care | Payer: Medicare Other | Source: Ambulatory Visit | Attending: Radiation Oncology | Admitting: Radiation Oncology

## 2021-08-06 DIAGNOSIS — Z510 Encounter for antineoplastic radiation therapy: Secondary | ICD-10-CM | POA: Diagnosis not present

## 2021-08-09 ENCOUNTER — Encounter: Payer: Self-pay | Admitting: Oncology

## 2021-08-10 NOTE — Telephone Encounter (Signed)
Looks like her radiation was cancelled. Her next treatment day is 08/25/21, so a little over 2 weeks. Ok to keep that date?

## 2021-08-11 ENCOUNTER — Ambulatory Visit: Payer: Medicare Other

## 2021-08-13 ENCOUNTER — Ambulatory Visit: Payer: Medicare Other

## 2021-08-18 ENCOUNTER — Ambulatory Visit
Admission: RE | Admit: 2021-08-18 | Discharge: 2021-08-18 | Disposition: A | Discharge status: Home / Self Care | Payer: Medicare Other | Source: Ambulatory Visit | Attending: Radiation Oncology | Admitting: Radiation Oncology

## 2021-08-18 ENCOUNTER — Telehealth: Payer: Self-pay | Admitting: *Deleted

## 2021-08-18 ENCOUNTER — Encounter: Payer: Self-pay | Admitting: *Deleted

## 2021-08-18 ENCOUNTER — Ambulatory Visit: Payer: Medicare Other

## 2021-08-18 DIAGNOSIS — Z510 Encounter for antineoplastic radiation therapy: Secondary | ICD-10-CM | POA: Diagnosis not present

## 2021-08-18 NOTE — Telephone Encounter (Signed)
Fmla forms rcvd on 08/18/21. Partially completed at this time. Waiting on daughter to respond back if she needs continuous vs intermittent fmla and preferred start date for the fmla.    Reviewed previous fmla form scanned into epic- daughter took fmla from 11/21/20-06/23/21

## 2021-08-19 ENCOUNTER — Ambulatory Visit
Admission: RE | Admit: 2021-08-19 | Discharge: 2021-08-19 | Disposition: A | Discharge status: Home / Self Care | Payer: Medicare Other | Source: Ambulatory Visit | Attending: Oncology | Admitting: Oncology

## 2021-08-19 ENCOUNTER — Other Ambulatory Visit: Payer: Self-pay

## 2021-08-19 DIAGNOSIS — C3490 Malignant neoplasm of unspecified part of unspecified bronchus or lung: Secondary | ICD-10-CM | POA: Insufficient documentation

## 2021-08-19 MED ORDER — GADOBUTROL 1 MMOL/ML IV SOLN
7.0000 mL | Freq: Once | INTRAVENOUS | Status: AC | PRN
  Administered 2021-08-19: 12:00:00 7 mL via INTRAVENOUS

## 2021-08-20 ENCOUNTER — Ambulatory Visit
Admission: RE | Admit: 2021-08-20 | Discharge: 2021-08-20 | Disposition: A | Discharge status: Home / Self Care | Payer: Medicare Other | Source: Ambulatory Visit | Attending: Radiation Oncology | Admitting: Radiation Oncology

## 2021-08-20 DIAGNOSIS — Z510 Encounter for antineoplastic radiation therapy: Secondary | ICD-10-CM | POA: Diagnosis not present

## 2021-08-21 NOTE — Telephone Encounter (Signed)
Form given to Dr. Janese Banks to sign on Wed. 12/28. Per [place] - daughter wants to pick up hard copy of form at 1/3 visit. Judeen Hammans, RN aware.

## 2021-08-25 ENCOUNTER — Inpatient Hospital Stay (HOSPITAL_BASED_OUTPATIENT_CLINIC_OR_DEPARTMENT_OTHER): Payer: Medicare Other | Admitting: Oncology

## 2021-08-25 ENCOUNTER — Other Ambulatory Visit: Payer: Self-pay

## 2021-08-25 ENCOUNTER — Encounter: Payer: Self-pay | Admitting: Oncology

## 2021-08-25 ENCOUNTER — Inpatient Hospital Stay: Payer: Medicare Other

## 2021-08-25 ENCOUNTER — Ambulatory Visit: Payer: Medicare Other

## 2021-08-25 ENCOUNTER — Inpatient Hospital Stay: Payer: Medicare Other | Attending: Oncology

## 2021-08-25 VITALS — BP 164/90 | HR 85 | Temp 95.0°F | Resp 17 | Wt 150.0 lb

## 2021-08-25 DIAGNOSIS — Z79899 Other long term (current) drug therapy: Secondary | ICD-10-CM | POA: Diagnosis not present

## 2021-08-25 DIAGNOSIS — Z87440 Personal history of urinary (tract) infections: Secondary | ICD-10-CM | POA: Insufficient documentation

## 2021-08-25 DIAGNOSIS — C3490 Malignant neoplasm of unspecified part of unspecified bronchus or lung: Secondary | ICD-10-CM | POA: Diagnosis not present

## 2021-08-25 DIAGNOSIS — E871 Hypo-osmolality and hyponatremia: Secondary | ICD-10-CM | POA: Diagnosis not present

## 2021-08-25 DIAGNOSIS — Z8719 Personal history of other diseases of the digestive system: Secondary | ICD-10-CM | POA: Insufficient documentation

## 2021-08-25 DIAGNOSIS — C7931 Secondary malignant neoplasm of brain: Secondary | ICD-10-CM | POA: Insufficient documentation

## 2021-08-25 DIAGNOSIS — Z8249 Family history of ischemic heart disease and other diseases of the circulatory system: Secondary | ICD-10-CM | POA: Diagnosis not present

## 2021-08-25 DIAGNOSIS — C7951 Secondary malignant neoplasm of bone: Secondary | ICD-10-CM

## 2021-08-25 DIAGNOSIS — Z885 Allergy status to narcotic agent status: Secondary | ICD-10-CM | POA: Insufficient documentation

## 2021-08-25 DIAGNOSIS — Z923 Personal history of irradiation: Secondary | ICD-10-CM | POA: Diagnosis not present

## 2021-08-25 DIAGNOSIS — Z8349 Family history of other endocrine, nutritional and metabolic diseases: Secondary | ICD-10-CM | POA: Insufficient documentation

## 2021-08-25 DIAGNOSIS — R5383 Other fatigue: Secondary | ICD-10-CM | POA: Diagnosis not present

## 2021-08-25 DIAGNOSIS — Z818 Family history of other mental and behavioral disorders: Secondary | ICD-10-CM | POA: Diagnosis not present

## 2021-08-25 DIAGNOSIS — E876 Hypokalemia: Secondary | ICD-10-CM | POA: Insufficient documentation

## 2021-08-25 DIAGNOSIS — Z801 Family history of malignant neoplasm of trachea, bronchus and lung: Secondary | ICD-10-CM | POA: Diagnosis not present

## 2021-08-25 DIAGNOSIS — C3432 Malignant neoplasm of lower lobe, left bronchus or lung: Secondary | ICD-10-CM | POA: Insufficient documentation

## 2021-08-25 DIAGNOSIS — R6889 Other general symptoms and signs: Secondary | ICD-10-CM | POA: Diagnosis not present

## 2021-08-25 DIAGNOSIS — Z8049 Family history of malignant neoplasm of other genital organs: Secondary | ICD-10-CM | POA: Diagnosis not present

## 2021-08-25 DIAGNOSIS — Z87891 Personal history of nicotine dependence: Secondary | ICD-10-CM | POA: Diagnosis not present

## 2021-08-25 DIAGNOSIS — Z882 Allergy status to sulfonamides status: Secondary | ICD-10-CM | POA: Diagnosis not present

## 2021-08-25 DIAGNOSIS — Z880 Allergy status to penicillin: Secondary | ICD-10-CM | POA: Diagnosis not present

## 2021-08-25 DIAGNOSIS — Z5112 Encounter for antineoplastic immunotherapy: Secondary | ICD-10-CM | POA: Insufficient documentation

## 2021-08-25 DIAGNOSIS — Z881 Allergy status to other antibiotic agents status: Secondary | ICD-10-CM | POA: Insufficient documentation

## 2021-08-25 DIAGNOSIS — Z888 Allergy status to other drugs, medicaments and biological substances status: Secondary | ICD-10-CM | POA: Insufficient documentation

## 2021-08-25 LAB — CBC WITH DIFFERENTIAL/PLATELET
Abs Immature Granulocytes: 0.04 10*3/uL (ref 0.00–0.07)
Basophils Absolute: 0 10*3/uL (ref 0.0–0.1)
Basophils Relative: 1 %
Eosinophils Absolute: 0.1 10*3/uL (ref 0.0–0.5)
Eosinophils Relative: 3 %
HCT: 32.9 % — ABNORMAL LOW (ref 36.0–46.0)
Hemoglobin: 11.3 g/dL — ABNORMAL LOW (ref 12.0–15.0)
Immature Granulocytes: 1 %
Lymphocytes Relative: 15 %
Lymphs Abs: 0.6 10*3/uL — ABNORMAL LOW (ref 0.7–4.0)
MCH: 32.4 pg (ref 26.0–34.0)
MCHC: 34.3 g/dL (ref 30.0–36.0)
MCV: 94.3 fL (ref 80.0–100.0)
Monocytes Absolute: 0.5 10*3/uL (ref 0.1–1.0)
Monocytes Relative: 13 %
Neutro Abs: 2.9 10*3/uL (ref 1.7–7.7)
Neutrophils Relative %: 67 %
Platelets: 129 10*3/uL — ABNORMAL LOW (ref 150–400)
RBC: 3.49 MIL/uL — ABNORMAL LOW (ref 3.87–5.11)
RDW: 14.7 % (ref 11.5–15.5)
WBC: 4.3 10*3/uL (ref 4.0–10.5)
nRBC: 0 % (ref 0.0–0.2)

## 2021-08-25 LAB — COMPREHENSIVE METABOLIC PANEL
ALT: 16 U/L (ref 0–44)
AST: 20 U/L (ref 15–41)
Albumin: 3.6 g/dL (ref 3.5–5.0)
Alkaline Phosphatase: 48 U/L (ref 38–126)
Anion gap: 9 (ref 5–15)
BUN: 11 mg/dL (ref 8–23)
CO2: 25 mmol/L (ref 22–32)
Calcium: 9 mg/dL (ref 8.9–10.3)
Chloride: 95 mmol/L — ABNORMAL LOW (ref 98–111)
Creatinine, Ser: 1.02 mg/dL — ABNORMAL HIGH (ref 0.44–1.00)
GFR, Estimated: 58 mL/min — ABNORMAL LOW (ref >60)
Glucose, Bld: 98 mg/dL (ref 70–99)
Potassium: 3.5 mmol/L (ref 3.5–5.1)
Sodium: 129 mmol/L — ABNORMAL LOW (ref 135–145)
Total Bilirubin: 0.7 mg/dL (ref 0.3–1.2)
Total Protein: 6.6 g/dL (ref 6.5–8.1)

## 2021-08-25 MED ORDER — SODIUM CHLORIDE 0.9 % IV SOLN
1200.0000 mg | Freq: Once | INTRAVENOUS | Status: AC
  Administered 2021-08-25: 1200 mg via INTRAVENOUS
  Filled 2021-08-25: qty 20

## 2021-08-25 MED ORDER — HEPARIN SOD (PORK) LOCK FLUSH 100 UNIT/ML IV SOLN
500.0000 [IU] | Freq: Once | INTRAVENOUS | Status: AC | PRN
  Administered 2021-08-25: 500 [IU]
  Filled 2021-08-25: qty 5

## 2021-08-25 MED ORDER — ZOLEDRONIC ACID 4 MG/100ML IV SOLN
4.0000 mg | INTRAVENOUS | Status: DC
  Administered 2021-08-25: 4 mg via INTRAVENOUS
  Filled 2021-08-25: qty 100

## 2021-08-25 MED ORDER — SODIUM CHLORIDE 0.9 % IV SOLN
Freq: Once | INTRAVENOUS | Status: AC
  Filled 2021-08-25: qty 250

## 2021-08-25 NOTE — Progress Notes (Signed)
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  She underwent CT chest with contrast in March 2022 following an abnormal x-ray which showed a 2.6 cm mass in the left lower lobe.  This was followed by a PET CT scan which showed a 3.0 x 2.0 cm mass in the left lower lobe of the lung with an SUV of 9.7.  No evidence of locoregional adenopathy.  No evidence of intra-abdominal disease with patient was noted to have a hypermetabolic focus in the right iliac bone with an SUV of 11.1 which was suspicious for metastatic disease.  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  Cells were positive for TTF-1, CD56 and chromogranin.  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  Patient completed whole brain radiation treatment and completed carbo etoposide Tecentriq 4 cycles on 03/10/2021.  Interval history-tolerating Tecentriq well so far.  She is also getting SBRT to her left lower lobe enlarging lesion  which she will be completing this week.  Has problems with short-term memory after completing whole brain radiation but has been able to function independently.  No recent falls  ECOG PS- 2 Pain scale- 0   Review of systems- Review of Systems  Constitutional:  Positive for malaise/fatigue. Negative for chills, fever and weight loss.  HENT:  Negative for congestion, ear discharge and nosebleeds.   Eyes:  Negative for blurred vision.  Respiratory:  Negative for cough, hemoptysis, sputum production, shortness of breath and wheezing.   Cardiovascular:  Negative for chest pain, palpitations, orthopnea and claudication.  Gastrointestinal:  Negative for abdominal pain, blood in stool, constipation, diarrhea, heartburn, melena, nausea and vomiting.  Genitourinary:  Negative for dysuria, flank pain, frequency, hematuria and urgency.  Musculoskeletal:  Negative for back pain, joint pain and myalgias.  Skin:  Negative for rash.  Neurological:  Negative for dizziness, tingling, focal weakness, seizures, weakness and headaches.  Endo/Heme/Allergies:  Does not bruise/bleed easily.  Psychiatric/Behavioral:  Negative for depression and suicidal ideas. The patient does not have insomnia.      Allergies  Allergen Reactions   Augmentin [Amoxicillin-Pot Clavulanate]     "messes up my blood cells"   Codeine Nausea Only   Levaquin [Levofloxacin]     Delusions    Sulfa Antibiotics Nausea And Vomiting   Vancomycin Other (See Comments)    Redman syndome- happened in the hospital at [facility]   Tape Rash    Some bandaids cause rash sometimes     Past Medical History:  Diagnosis Date   Anxiety    Breathing problem  low breathing function 53%   COPD (chronic obstructive pulmonary disease) (HCC)    EMPHYSEMA   Dysrhythmia    tachycardia   Emphysema of lung (HCC)    Hemorrhoid    History of hiatal hernia    SMALL- 2022   Hypercholesteremia    Hypertension    Hypothyroidism    Larynx polyp    Lung  cancer (HCC)    Palpitations    with anxiety   Platelets decreased ([place]) 2020   Seasonal allergies    Tinnitus    Vertigo    no episodes in several years   Wears dentures    partial lower   White coat syndrome with hypertension      Past Surgical History:  Procedure Laterality Date   ABDOMINAL HYSTERECTOMY     partial   COLONOSCOPY WITH PROPOFOL N/A 01/22/2016   Procedure: COLONOSCOPY WITH PROPOFOL;  Surgeon: Lucilla Lame, MD;  Location: [place];  Service: Endoscopy;  Laterality: N/A;   COLONOSCOPY WITH PROPOFOL N/A 04/12/2019   Procedure: COLONOSCOPY WITH BIOPSY;  Surgeon: Lucilla Lame, MD;  Location: [place];  Service: Endoscopy;  Laterality: N/A;  pt would like an early appt   ct guided lung bx     IR IMAGING GUIDED PORT INSERTION  12/18/2020   POLYPECTOMY  01/22/2016   Procedure: POLYPECTOMY;  Surgeon: Lucilla Lame, MD;  Location: [place];  Service: Endoscopy;;   POLYPECTOMY N/A 04/12/2019   Procedure: POLYPECTOMY;  Surgeon: Lucilla Lame, MD;  Location: [place];  Service: Endoscopy;  Laterality: N/A;   VIDEO BRONCHOSCOPY WITH ENDOBRONCHIAL NAVIGATION N/A 11/21/2020   Procedure: VIDEO BRONCHOSCOPY WITH ENDOBRONCHIAL NAVIGATION;  Surgeon: Ottie Glazier, MD;  Location: [facility];  Service: Thoracic;  Laterality: N/A;   VIDEO BRONCHOSCOPY WITH ENDOBRONCHIAL ULTRASOUND N/A 11/21/2020   Procedure: VIDEO BRONCHOSCOPY WITH ENDOBRONCHIAL ULTRASOUND;  Surgeon: Ottie Glazier, MD;  Location: [facility];  Service: Thoracic;  Laterality: N/A;    Social History   Socioeconomic History   Marital status: Married    Spouse name: Not on file   Number of children: Not on file   Years of education: Not on file   Highest education level: Not on file  Occupational History   Not on file  Tobacco Use   Smoking status: Former    Packs/day: 0.50    Years: 50.00    Pack years: 25.00    Types: Cigarettes    Quit date: 11/03/2020    Years since quitting:  0.8   Smokeless tobacco: Never  Vaping Use   Vaping Use: Some days  Substance and Sexual Activity   Alcohol use: No   Drug use: Never   Sexual activity: Not on file  Other Topics Concern   Not on file  Social History Narrative   Not on file   Social Determinants of Health   Financial Resource Strain: Not on file  Food Insecurity: Not on file  Transportation Needs: Not on file  Physical Activity: Not on file  Stress: Not on file  Social Connections: Not on file  Intimate Partner Violence: Not on file    Family History  Problem Relation Age of Onset   Alzheimer's disease Mother    Heart attack Mother    High Cholesterol Mother    Hypertension Mother    Heart block Mother    Lung cancer Father    Uterine cancer Sister    Hypertension Sister    Heart block Sister    Anxiety  disorder Sister    Breast cancer Neg Hx      Current Outpatient Medications:    acetaminophen (TYLENOL) 500 MG tablet, Take 500 mg by mouth every 6 (six) hours as needed for mild pain or moderate pain., Disp: , Rfl:    atorvastatin (LIPITOR) 10 MG tablet, TAKE 1 TABLET BY MOUTH DAILY, Disp: 90 tablet, Rfl: 0   ketotifen (ZADITOR) 0.025 % ophthalmic solution, Place 1 drop into both eyes 2 (two) times daily as needed., Disp: , Rfl:    levothyroxine (SYNTHROID, LEVOTHROID) 50 MCG tablet, Take 50 mcg by mouth daily before breakfast., Disp: , Rfl:    loperamide (IMODIUM) 2 MG capsule, Take 2 mg by mouth as needed for diarrhea or loose stools., Disp: , Rfl:    loratadine (CLARITIN) 10 MG tablet, Take 10 mg by mouth daily. Reported on 01/22/2016, Disp: , Rfl:    LORazepam (ATIVAN) 0.5 MG tablet, TAKE 1 TABLET(0.5 MG) BY MOUTH EVERY 6 HOURS AS NEEDED FOR ANXIETY, Disp: 120 tablet, Rfl: 0   losartan-hydrochlorothiazide (HYZAAR) 100-12.5 MG tablet, , Disp: , Rfl:    magnesium chloride (SLOW-MAG) 64 MG TBEC SR tablet, Take 1 tablet (64 mg total) by mouth daily., Disp: 30 tablet, Rfl: 0   metoprolol succinate  (TOPROL-XL) 50 MG 24 hr tablet, Take 50 mg by mouth daily., Disp: , Rfl:    potassium chloride SA (KLOR-CON M) 20 MEQ tablet, TAKE 1 TABLET BY MOUTH EVERY DAY, Disp: 28 tablet, Rfl: 0   QUEtiapine (SEROQUEL) 25 MG tablet, Take 1 tablet (25 mg total) by mouth at bedtime., Disp: 30 tablet, Rfl: 2   sodium chloride 1 g tablet, Take 1 tablet (1 g total) by mouth 4 (four) times daily., Disp: 120 tablet, Rfl: 0   TRELEGY ELLIPTA 100-62.5-25 MCG/ACT AEPB, INHALE 1 PUFF INTO THE LUNGS DAILY, Disp: 60 each, Rfl: 3   nitrofurantoin, macrocrystal-monohydrate, (MACROBID) 100 MG capsule, Take 1 capsule (100 mg total) by mouth 2 (two) times daily., Disp: 10 capsule, Rfl: 0   ondansetron (ZOFRAN) 8 MG tablet, TAKE 1 TABLET BY MOUTH TWICE DAILY AS NEEDED FOR REFRACTORY NAUSEA/ VOMITING. START ON DAY 3 AFTER CARBOPLATIN CHEMO, Disp: 30 tablet, Rfl: 3   potassium chloride SA (KLOR-CON M) 20 MEQ tablet, Take 1 tablet (20 mEq total) by mouth 2 (two) times daily., Disp: 10 tablet, Rfl: 0   sertraline (ZOLOFT) 50 MG tablet, Take 50 mg by mouth daily., Disp: , Rfl:    sertraline (ZOLOFT) 50 MG tablet, Take 1 tablet by mouth at bedtime., Disp: , Rfl:  No current facility-administered medications for this visit.  Facility-Administered Medications Ordered in Other Visits:    0.9 %  sodium chloride infusion, , Intravenous, Once, Earlie Server, MD   Zoledronic Acid (ZOMETA) IVPB 4 mg, 4 mg, Intravenous, Q28 days, Sindy Guadeloupe, MD, Stopped at 08/25/21 1137  Physical exam:  Vitals:   08/25/21 0948  BP: (!) 164/90  Pulse: 85  Resp: 17  Temp: (!) 95 F (35 C)  TempSrc: Tympanic  SpO2: 100%  Weight: 150 lb (68 kg)   Physical Exam Cardiovascular:     Rate and Rhythm: Normal rate and regular rhythm.     Heart sounds: Normal heart sounds.  Pulmonary:     Effort: Pulmonary effort is normal.     Breath sounds: Normal breath sounds.  Abdominal:     General: Bowel sounds are normal.     Palpations: Abdomen is soft.   Skin:    General: Skin  is warm and dry.  Neurological:     Mental Status: She is alert and oriented to person, place, and time.     CMP Latest Ref Rng & Units 08/25/2021  Glucose 70 - 99 mg/dL 98  BUN 8 - 23 mg/dL 11  Creatinine 0.44 - 1.00 mg/dL 1.02(H)  Sodium 135 - 145 mmol/L 129(L)  Potassium 3.5 - 5.1 mmol/L 3.5  Chloride 98 - 111 mmol/L 95(L)  CO2 22 - 32 mmol/L 25  Calcium 8.9 - 10.3 mg/dL 9.0  Total Protein 6.5 - 8.1 g/dL 6.6  Total Bilirubin 0.3 - 1.2 mg/dL 0.7  Alkaline Phos 38 - 126 U/L 48  AST 15 - 41 U/L 20  ALT 0 - 44 U/L 16   CBC Latest Ref Rng & Units 08/25/2021  WBC 4.0 - 10.5 K/uL 4.3  Hemoglobin 12.0 - 15.0 g/dL 11.3(L)  Hematocrit 36.0 - 46.0 % 32.9(L)  Platelets 150 - 400 K/uL 129(L)    No images are attached to the encounter.  MR Brain W Wo Contrast  Result Date: 08/20/2021 CLINICAL DATA:  Small-cell lung cancer. History of brain metastases status post whole brain radiation. EXAM: MRI HEAD WITHOUT AND WITH CONTRAST TECHNIQUE: Multiplanar, multiecho pulse sequences of the brain and surrounding structures were obtained without and with intravenous contrast. CONTRAST:  61mL GADAVIST GADOBUTROL 1 MMOL/ML IV SOLN COMPARISON:  Head MRI 05/26/2021 FINDINGS: Brain: There is no evidence of an acute infarct, midline shift, or extra-axial fluid collection. Mild cerebral atrophy is within normal limits for age. Patchy and confluent T2 hyperintensities in the cerebral white matter bilaterally have greatly progressed and likely reflect post treatment changes. Patchy T2 hyperintensity in the pons is unchanged. Chronic microhemorrhages are again seen in the medial right parietal and posterior frontal lobes. A faint 2 mm focus of enhancement laterally in the right cerebellar hemisphere (series 1021, image 55) was not visible on the most recent prior study, however it corresponds to a previously treated lesion and is much smaller than on the 12/24/2020 pretreatment study. No new  enhancing lesions are identified. Vascular: Major intracranial vascular flow voids are preserved. Skull and upper cervical spine: Unremarkable bone marrow signal para Sinuses/Orbits: Unremarkable orbits. New moderate mucosal thickening in the paranasal sinuses. Small volume fluid in the right maxillary sinus. Small right mastoid effusion. Other: None. IMPRESSION: 1. 2 mm focus of enhancement in the right cerebellum corresponding to a previously treated metastasis. 2. No evidence of new intracranial metastases. 3. Progressive post treatment changes in the cerebral white matter. Electronically Signed   By: Logan Bores M.D.   On: 08/20/2021 08:58     Assessment and plan- Patient is a 76 y.o. 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  She is here for on treatment assessment prior to cycle 7 of palliative Tecentriq  Counts okay to proceed with cycle 7 of palliative Tecentriq today.  She will be completing SBRT to her lung today as well.She has chronic hyponatremia despite taking salt tablets.  Overall stable.  Continue to monitor .  Recent TSH has been normal.  I will see her back in 3 weeks for cycle 8.Patient will also receive Zometa today for bone metastases.  Repeat scans due in February 2023. Visit Diagnosis 1. Small cell lung cancer in adult [facility])   2. Other general symptoms and signs      Dr. Randa Evens, MD, MPH [place] [facility]  [facility] [phone] 08/25/2021 1:09 PM

## 2021-08-25 NOTE — Patient Instructions (Signed)
[facility]  Discharge Instructions: Thank you for choosing [facility] to provide your oncology and hematology care.  If you have a lab appointment with the [facility], please go directly to the [facility] and check in at the registration area.  Wear comfortable clothing and clothing appropriate for easy access to any Portacath or PICC line.   We strive to give you quality time with your provider. You may need to reschedule your appointment if you arrive late (15 or more minutes).  Arriving late affects you and other patients whose appointments are after yours.  Also, if you miss three or more appointments without notifying the office, you may be dismissed from the clinic at the providers discretion.      For prescription refill requests, have your pharmacy contact our office and allow 72 hours for refills to be completed.    Today you received the following chemotherapy and/or immunotherapy agents: Zometa, Tecentriq      To help prevent nausea and vomiting after your treatment, we encourage you to take your nausea medication as directed.  BELOW ARE SYMPTOMS THAT SHOULD BE REPORTED IMMEDIATELY: *FEVER GREATER THAN 100.4 F (38 C) OR HIGHER *CHILLS OR SWEATING *NAUSEA AND VOMITING THAT IS NOT CONTROLLED WITH YOUR NAUSEA MEDICATION *UNUSUAL SHORTNESS OF BREATH *UNUSUAL BRUISING OR BLEEDING *URINARY PROBLEMS (pain or burning when urinating, or frequent urination) *BOWEL PROBLEMS (unusual diarrhea, constipation, pain near the anus) TENDERNESS IN MOUTH AND THROAT WITH OR WITHOUT PRESENCE OF ULCERS (sore throat, sores in mouth, or a toothache) UNUSUAL RASH, SWELLING OR PAIN  UNUSUAL VAGINAL DISCHARGE OR ITCHING   Items with * indicate a potential emergency and should be followed up as soon as possible or go to the Emergency Department if any problems should occur.  Please show the CHEMOTHERAPY ALERT CARD or IMMUNOTHERAPY ALERT  CARD at check-in to the Emergency Department and triage nurse.  Should you have questions after your visit or need to cancel or reschedule your appointment, please contact [facility] [place]  [phone] and follow the prompts.  Office hours are 8:00 a.m. to 4:30 p.m. Monday - Friday. Please note that voicemails left after 4:00 p.m. may not be returned until the following business day.  We are closed weekends and major holidays. You have access to a nurse at all times for urgent questions. Please call the main number to the clinic [phone] and follow the prompts.  For any non-urgent questions, you may also contact your provider using MyChart. We now offer e-Visits for anyone 74 and older to request care online for non-urgent symptoms. For details visit [email or web address].   Also download the MyChart app! Go to the app store, search "MyChart", open the app, select , and log in with your MyChart username and password.  Due to Covid, a mask is required upon entering the hospital/clinic. If you do not have a mask, one will be given to you upon arrival. For doctor visits, patients may have 1 support person aged 74 or older with them. For treatment visits, patients cannot have anyone with them due to current Covid guidelines and our immunocompromised population. c Acid Injection (Hypercalcemia, Oncology) What is this medication? ZOLEDRONIC ACID (ZOE le dron ik AS id) slows calcium loss from bones. It high calcium levels in the blood from some kinds of cancer. It may be used in other people at risk for bone loss. This medicine may be used for other purposes; ask your  health care provider or pharmacist if you have questions. COMMON BRAND NAME(S): Zometa What should I tell my care team before I take this medication? They need to know if you have any of these conditions: cancer dehydration dental disease kidney disease liver disease low levels of calcium in  the blood lung or breathing disease (asthma) receiving steroids like dexamethasone or prednisone an unusual or allergic reaction to zoledronic acid, other medicines, foods, dyes, or preservatives pregnant or trying to get pregnant breast-feeding How should I use this medication? This drug is injected into a vein. It is given by a health care provider in a hospital or clinic setting. Talk to your health care provider about the use of this drug in children. Special care may be needed. Overdosage: If you think you have taken too much of this medicine contact a poison control center or emergency room at once. NOTE: This medicine is only for you. Do not share this medicine with others. What if I miss a dose? Keep appointments for follow-up doses. It is important not to miss your dose. Call your health care provider if you are unable to keep an appointment. What may interact with this medication? certain antibiotics given by injection NSAIDs, medicines for pain and inflammation, like ibuprofen or naproxen some diuretics like bumetanide, furosemide teriparatide thalidomide This list may not describe all possible interactions. Give your health care provider a list of all the medicines, herbs, non-prescription drugs, or dietary supplements you use. Also tell them if you smoke, drink alcohol, or use illegal drugs. Some items may interact with your medicine. What should I watch for while using this medication? Visit your health care provider for regular checks on your progress. It may be some time before you see the benefit from this drug. Some people who take this drug have severe bone, joint, or muscle pain. This drug may also increase your risk for jaw problems or a broken thigh bone. Tell your health care provider right away if you have severe pain in your jaw, bones, joints, or muscles. Tell you health care provider if you have any pain that does not go away or that gets worse. Tell your dentist and  dental surgeon that you are taking this drug. You should not have major dental surgery while on this drug. See your dentist to have a dental exam and fix any dental problems before starting this drug. Take good care of your teeth while on this drug. Make sure you see your dentist for regular follow-up appointments. You should make sure you get enough calcium and vitamin D while you are taking this drug. Discuss the foods you eat and the vitamins you take with your health care provider. Check with your health care provider if you have severe diarrhea, nausea, and vomiting, or if you sweat a lot. The loss of too much body fluid may make it dangerous for you to take this drug. You may need blood work done while you are taking this drug. Do not become pregnant while taking this drug. Women should inform their health care provider if they wish to become pregnant or think they might be pregnant. There is potential for serious harm to an unborn child. Talk to your health care provider for more information. What side effects may I notice from receiving this medication? Side effects that you should report to your doctor or health care provider as soon as possible: allergic reactions (skin rash, itching or hives; swelling of the face, lips, or tongue)  bone pain infection (fever, chills, cough, sore throat, pain or trouble passing urine) jaw pain, especially after dental work joint pain kidney injury (trouble passing urine or change in the amount of urine) low blood pressure (dizziness; feeling faint or lightheaded, falls; unusually weak or tired) low calcium levels (fast heartbeat; muscle cramps or pain; pain, tingling, or numbness in the hands or feet; seizures) low magnesium levels (fast, irregular heartbeat; muscle cramp or pain; muscle weakness; tremors; seizures) low red blood cell counts (trouble breathing; feeling faint; lightheaded, falls; unusually weak or tired) muscle pain redness, blistering,  peeling, or loosening of the skin, including inside the mouth severe diarrhea swelling of the ankles, feet, hands trouble breathing Side effects that usually do not require medical attention (report to your doctor or health care provider if they continue or are bothersome): anxious constipation coughing depressed mood eye irritation, itching, or pain fever general ill feeling or flu-like symptoms nausea pain, redness, or irritation at site where injected trouble sleeping This list may not describe all possible side effects. Call your doctor for medical advice about side effects. You may report side effects to FDA at 1-800-FDA-1088. Where should I keep my medication? This drug is given in a hospital or clinic. It will not be stored at home. NOTE: This sheet is a summary. It may not cover all possible information. If you have questions about this medicine, talk to your doctor, pharmacist, or health care provider.  2022 Elsevier/Gold Standard (2021-04-28 00:00:00) Atezolizumab injection What is this medication? ATEZOLIZUMAB (a te zoe LIZ ue mab) is a monoclonal antibody. It is used to treat bladder cancer (urothelial cancer), liver cancer, lung cancer, and melanoma. This medicine may be used for other purposes; ask your health care provider or pharmacist if you have questions. COMMON BRAND NAME(S): Tecentriq What should I tell my care team before I take this medication? They need to know if you have any of these conditions: autoimmune diseases like Crohn's disease, ulcerative colitis, or lupus have had or planning to have an allogeneic stem cell transplant (uses someone else's stem cells) history of organ transplant history of radiation to the chest nervous system problems like myasthenia gravis or Guillain-Barre syndrome an unusual or allergic reaction to atezolizumab, other medicines, foods, dyes, or preservatives pregnant or trying to get pregnant breast-feeding How should I use  this medication? This medicine is for infusion into a vein. It is given by a health care professional in a hospital or clinic setting. A special MedGuide will be given to you before each treatment. Be sure to read this information carefully each time. Talk to your pediatrician regarding the use of this medicine in children. Special care may be needed. Overdosage: If you think you have taken too much of this medicine contact a poison control center or emergency room at once. NOTE: This medicine is only for you. Do not share this medicine with others. What if I miss a dose? It is important not to miss your dose. Call your doctor or health care professional if you are unable to keep an appointment. What may interact with this medication? Interactions have not been studied. This list may not describe all possible interactions. Give your health care provider a list of all the medicines, herbs, non-prescription drugs, or dietary supplements you use. Also tell them if you smoke, drink alcohol, or use illegal drugs. Some items may interact with your medicine. What should I watch for while using this medication? Your condition will be monitored carefully while  you are receiving this medicine. You may need blood work done while you are taking this medicine. Do not become pregnant while taking this medicine or for at least 5 months after stopping it. Women should inform their doctor if they wish to become pregnant or think they might be pregnant. There is a potential for serious side effects to an unborn child. Talk to your health care professional or pharmacist for more information. Do not breast-feed an infant while taking this medicine or for at least 5 months after the last dose. What side effects may I notice from receiving this medication? Side effects that you should report to your doctor or health care professional as soon as possible: allergic reactions like skin rash, itching or hives, swelling of  the face, lips, or tongue black, tarry stools bloody or watery diarrhea breathing problems changes in vision chest pain or chest tightness chills facial flushing fever headache signs and symptoms of high blood sugar such as dizziness; dry mouth; dry skin; fruity breath; nausea; stomach pain; increased hunger or thirst; increased urination signs and symptoms of liver injury like dark yellow or brown urine; general ill feeling or flu-like symptoms; light-colored stools; loss of appetite; nausea; right upper belly pain; unusually weak or tired; yellowing of the eyes or skin stomach pain trouble passing urine or change in the amount of urine Side effects that usually do not require medical attention (report to your doctor or health care professional if they continue or are bothersome): bone pain cough diarrhea joint pain muscle pain muscle weakness swelling of arms or legs tiredness weight loss This list may not describe all possible side effects. Call your doctor for medical advice about side effects. You may report side effects to FDA at 1-800-FDA-1088. Where should I keep my medication? This drug is given in a hospital or clinic and will not be stored at home. NOTE: This sheet is a summary. It may not cover all possible information. If you have questions about this medicine, talk to your doctor, pharmacist, or health care provider.  2022 Elsevier/Gold Standard (2021-04-28 00:00:00)

## 2021-08-25 NOTE — Progress Notes (Signed)
Patient here for oncology follow-up appointment,  concerns of leg sore

## 2021-08-26 ENCOUNTER — Ambulatory Visit
Admission: RE | Admit: 2021-08-26 | Discharge: 2021-08-26 | Disposition: A | Discharge status: Home / Self Care | Payer: Medicare Other | Source: Ambulatory Visit | Attending: Radiation Oncology | Admitting: Radiation Oncology

## 2021-08-26 DIAGNOSIS — C7931 Secondary malignant neoplasm of brain: Secondary | ICD-10-CM | POA: Insufficient documentation

## 2021-08-26 DIAGNOSIS — R300 Dysuria: Secondary | ICD-10-CM | POA: Diagnosis not present

## 2021-08-26 DIAGNOSIS — Z510 Encounter for antineoplastic radiation therapy: Secondary | ICD-10-CM | POA: Insufficient documentation

## 2021-08-26 DIAGNOSIS — C3490 Malignant neoplasm of unspecified part of unspecified bronchus or lung: Secondary | ICD-10-CM | POA: Insufficient documentation

## 2021-08-27 ENCOUNTER — Encounter: Payer: Self-pay | Admitting: Oncology

## 2021-09-08 ENCOUNTER — Other Ambulatory Visit: Payer: Self-pay | Admitting: *Deleted

## 2021-09-08 MED ORDER — POTASSIUM CHLORIDE CRYS ER 20 MEQ PO TBCR: 20.0000 meq | EXTENDED_RELEASE_TABLET | Freq: Every day | ORAL | 0 refills | Status: DC

## 2021-09-08 NOTE — Telephone Encounter (Signed)
°  Component Ref Range & Units 2 wk ago (08/25/21) 1 mo ago (08/04/21) 1 mo ago (07/14/21) 2 mo ago (06/23/21) 3 mo ago (06/02/21) 3 mo ago (05/12/21) 4 mo ago (04/14/21)  Potassium 3.5 - 5.1 mmol/L 3.5  3.2 Low   3.5  3.6

## 2021-09-12 ENCOUNTER — Other Ambulatory Visit: Payer: Self-pay | Admitting: Oncology

## 2021-09-12 ENCOUNTER — Other Ambulatory Visit: Payer: Self-pay | Admitting: Radiation Oncology

## 2021-09-13 ENCOUNTER — Encounter: Payer: Self-pay | Admitting: Oncology

## 2021-09-15 ENCOUNTER — Inpatient Hospital Stay: Payer: Medicare Other

## 2021-09-15 ENCOUNTER — Encounter: Payer: Self-pay | Admitting: Oncology

## 2021-09-15 ENCOUNTER — Inpatient Hospital Stay (HOSPITAL_BASED_OUTPATIENT_CLINIC_OR_DEPARTMENT_OTHER): Payer: Medicare Other | Admitting: Oncology

## 2021-09-15 ENCOUNTER — Other Ambulatory Visit: Payer: Self-pay

## 2021-09-15 VITALS — BP 132/62 | HR 94 | Temp 98.0°F | Resp 18 | Wt 149.0 lb

## 2021-09-15 DIAGNOSIS — C3490 Malignant neoplasm of unspecified part of unspecified bronchus or lung: Secondary | ICD-10-CM

## 2021-09-15 DIAGNOSIS — E876 Hypokalemia: Secondary | ICD-10-CM | POA: Diagnosis not present

## 2021-09-15 DIAGNOSIS — R6889 Other general symptoms and signs: Secondary | ICD-10-CM

## 2021-09-15 DIAGNOSIS — Z5112 Encounter for antineoplastic immunotherapy: Secondary | ICD-10-CM

## 2021-09-15 DIAGNOSIS — E871 Hypo-osmolality and hyponatremia: Secondary | ICD-10-CM

## 2021-09-15 DIAGNOSIS — C7951 Secondary malignant neoplasm of bone: Secondary | ICD-10-CM | POA: Diagnosis not present

## 2021-09-15 LAB — CBC WITH DIFFERENTIAL/PLATELET
Abs Immature Granulocytes: 0.05 10*3/uL (ref 0.00–0.07)
Basophils Absolute: 0 10*3/uL (ref 0.0–0.1)
Basophils Relative: 0 %
Eosinophils Absolute: 0.3 10*3/uL (ref 0.0–0.5)
Eosinophils Relative: 4 %
HCT: 30.4 % — ABNORMAL LOW (ref 36.0–46.0)
Hemoglobin: 10.6 g/dL — ABNORMAL LOW (ref 12.0–15.0)
Immature Granulocytes: 1 %
Lymphocytes Relative: 7 %
Lymphs Abs: 0.5 10*3/uL — ABNORMAL LOW (ref 0.7–4.0)
MCH: 32.8 pg (ref 26.0–34.0)
MCHC: 34.9 g/dL (ref 30.0–36.0)
MCV: 94.1 fL (ref 80.0–100.0)
Monocytes Absolute: 0.4 10*3/uL (ref 0.1–1.0)
Monocytes Relative: 6 %
Neutro Abs: 5.6 10*3/uL (ref 1.7–7.7)
Neutrophils Relative %: 82 %
Platelets: 153 10*3/uL (ref 150–400)
RBC: 3.23 MIL/uL — ABNORMAL LOW (ref 3.87–5.11)
RDW: 14.3 % (ref 11.5–15.5)
WBC: 6.9 10*3/uL (ref 4.0–10.5)
nRBC: 0 % (ref 0.0–0.2)

## 2021-09-15 LAB — COMPREHENSIVE METABOLIC PANEL
ALT: 14 U/L (ref 0–44)
AST: 17 U/L (ref 15–41)
Albumin: 3.5 g/dL (ref 3.5–5.0)
Alkaline Phosphatase: 51 U/L (ref 38–126)
Anion gap: 8 (ref 5–15)
BUN: 11 mg/dL (ref 8–23)
CO2: 24 mmol/L (ref 22–32)
Calcium: 9.1 mg/dL (ref 8.9–10.3)
Chloride: 96 mmol/L — ABNORMAL LOW (ref 98–111)
Creatinine, Ser: 0.96 mg/dL (ref 0.44–1.00)
GFR, Estimated: >60 mL/min (ref >60)
Glucose, Bld: 151 mg/dL — ABNORMAL HIGH (ref 70–99)
Potassium: 3.2 mmol/L — ABNORMAL LOW (ref 3.5–5.1)
Sodium: 128 mmol/L — ABNORMAL LOW (ref 135–145)
Total Bilirubin: 0.6 mg/dL (ref 0.3–1.2)
Total Protein: 6.9 g/dL (ref 6.5–8.1)

## 2021-09-15 LAB — TSH: TSH: 1.962 u[IU]/mL (ref 0.350–4.500)

## 2021-09-15 MED ORDER — HEPARIN SOD (PORK) LOCK FLUSH 100 UNIT/ML IV SOLN
500.0000 [IU] | Freq: Once | INTRAVENOUS | Status: AC
  Administered 2021-09-15: 12:00:00 500 [IU] via INTRAVENOUS
  Filled 2021-09-15: qty 5

## 2021-09-15 MED ORDER — HEPARIN SOD (PORK) LOCK FLUSH 100 UNIT/ML IV SOLN
500.0000 [IU] | Freq: Once | INTRAVENOUS | Status: DC | PRN
  Filled 2021-09-15: qty 5

## 2021-09-15 MED ORDER — SODIUM CHLORIDE 0.9 % IV SOLN
1200.0000 mg | Freq: Once | INTRAVENOUS | Status: AC
  Administered 2021-09-15: 11:00:00 1200 mg via INTRAVENOUS
  Filled 2021-09-15: qty 20

## 2021-09-15 MED ORDER — SODIUM CHLORIDE 0.9% FLUSH
10.0000 mL | INTRAVENOUS | Status: DC | PRN
  Administered 2021-09-15: 10:00:00 10 mL via INTRAVENOUS
  Filled 2021-09-15: qty 10

## 2021-09-15 MED ORDER — SODIUM CHLORIDE 0.9 % IV SOLN
Freq: Once | INTRAVENOUS | Status: AC
  Filled 2021-09-15: qty 250

## 2021-09-15 NOTE — Addendum Note (Signed)
Addended by: Luella Cook on: 09/15/2021 10:41 PM   Modules accepted: Orders

## 2021-09-15 NOTE — Progress Notes (Signed)
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  She underwent CT chest with contrast in March 2022 following an abnormal x-ray which showed a 2.6 cm mass in the left lower lobe.  This was followed by a PET CT scan which showed a 3.0 x 2.0 cm mass in the left lower lobe of the lung with an SUV of 9.7.  No evidence of locoregional adenopathy.  No evidence of intra-abdominal disease with patient was noted to have a hypermetabolic focus in the right iliac bone with an SUV of 11.1 which was suspicious for metastatic disease.  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  Cells were positive for TTF-1, CD56 and chromogranin.  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  Patient completed whole brain radiation treatment and completed carbo etoposide Tecentriq 4 cycles on 03/10/2021.  Interval history-she is completing her antibiotic course for UTI today.  Reports some low back pain for the last 1  week.  Denies any burning urination at this time  ECOG PS- 1 Pain scale- 0   Review of systems- Review of Systems  Constitutional:  Positive for malaise/fatigue. Negative for chills, fever and weight loss.  HENT:  Negative for congestion, ear discharge and nosebleeds.   Eyes:  Negative for blurred vision.  Respiratory:  Negative for cough, hemoptysis, sputum production, shortness of breath and wheezing.   Cardiovascular:  Negative for chest pain, palpitations, orthopnea and claudication.  Gastrointestinal:  Negative for abdominal pain, blood in stool, constipation, diarrhea, heartburn, melena, nausea and vomiting.  Genitourinary:  Negative for dysuria, flank pain, frequency, hematuria and urgency.  Musculoskeletal:  Negative for back pain, joint pain and myalgias.  Skin:  Negative for rash.  Neurological:  Negative for dizziness, tingling, focal weakness, seizures, weakness and headaches.  Endo/Heme/Allergies:  Does not bruise/bleed easily.  Psychiatric/Behavioral:  Negative for depression and suicidal ideas. The patient does not have insomnia.      Allergies  Allergen Reactions   Augmentin [Amoxicillin-Pot Clavulanate]     "messes up my blood cells"   Codeine Nausea Only   Levaquin [Levofloxacin]     Delusions    Sulfa Antibiotics Nausea And Vomiting   Vancomycin Other (See Comments)    Redman syndome- happened in the hospital at [facility]   Tape Rash    Some bandaids cause rash sometimes     Past Medical History:  Diagnosis Date   Anxiety    Breathing problem    low breathing function 53%   COPD (chronic obstructive pulmonary disease) (HCC)    EMPHYSEMA   Dysrhythmia  tachycardia   Emphysema of lung (HCC)    Hemorrhoid    History of hiatal hernia    SMALL- 2022   Hypercholesteremia    Hypertension    Hypothyroidism    Larynx polyp    Lung cancer (HCC)    Palpitations    with anxiety   Platelets decreased ([place]) 2020   Seasonal allergies    Tinnitus    Vertigo     no episodes in several years   Wears dentures    partial lower   White coat syndrome with hypertension      Past Surgical History:  Procedure Laterality Date   ABDOMINAL HYSTERECTOMY     partial   COLONOSCOPY WITH PROPOFOL N/A 01/22/2016   Procedure: COLONOSCOPY WITH PROPOFOL;  Surgeon: Lucilla Lame, MD;  Location: [place];  Service: Endoscopy;  Laterality: N/A;   COLONOSCOPY WITH PROPOFOL N/A 04/12/2019   Procedure: COLONOSCOPY WITH BIOPSY;  Surgeon: Lucilla Lame, MD;  Location: [place];  Service: Endoscopy;  Laterality: N/A;  pt would like an early appt   ct guided lung bx     IR IMAGING GUIDED PORT INSERTION  12/18/2020   POLYPECTOMY  01/22/2016   Procedure: POLYPECTOMY;  Surgeon: Lucilla Lame, MD;  Location: [place];  Service: Endoscopy;;   POLYPECTOMY N/A 04/12/2019   Procedure: POLYPECTOMY;  Surgeon: Lucilla Lame, MD;  Location: [place];  Service: Endoscopy;  Laterality: N/A;   VIDEO BRONCHOSCOPY WITH ENDOBRONCHIAL NAVIGATION N/A 11/21/2020   Procedure: VIDEO BRONCHOSCOPY WITH ENDOBRONCHIAL NAVIGATION;  Surgeon: Ottie Glazier, MD;  Location: [facility];  Service: Thoracic;  Laterality: N/A;   VIDEO BRONCHOSCOPY WITH ENDOBRONCHIAL ULTRASOUND N/A 11/21/2020   Procedure: VIDEO BRONCHOSCOPY WITH ENDOBRONCHIAL ULTRASOUND;  Surgeon: Ottie Glazier, MD;  Location: [facility];  Service: Thoracic;  Laterality: N/A;    Social History   Socioeconomic History   Marital status: Married    Spouse name: Not on file   Number of children: Not on file   Years of education: Not on file   Highest education level: Not on file  Occupational History   Not on file  Tobacco Use   Smoking status: Former    Packs/day: 0.50    Years: 50.00    Pack years: 25.00    Types: Cigarettes    Quit date: 11/03/2020    Years since quitting: 0.8   Smokeless tobacco: Never  Vaping Use   Vaping Use: Some days  Substance and Sexual Activity   Alcohol use: No   Drug  use: Never   Sexual activity: Not on file  Other Topics Concern   Not on file  Social History Narrative   Not on file   Social Determinants of Health   Financial Resource Strain: Not on file  Food Insecurity: Not on file  Transportation Needs: Not on file  Physical Activity: Not on file  Stress: Not on file  Social Connections: Not on file  Intimate Partner Violence: Not on file    Family History  Problem Relation Age of Onset   Alzheimer's disease Mother    Heart attack Mother    High Cholesterol Mother    Hypertension Mother    Heart block Mother    Lung cancer Father    Uterine cancer Sister    Hypertension Sister    Heart block Sister    Anxiety disorder Sister    Breast cancer Neg Hx      Current Outpatient Medications:    acetaminophen (TYLENOL)  500 MG tablet, Take 500 mg by mouth every 6 (six) hours as needed for mild pain or moderate pain., Disp: , Rfl:    atorvastatin (LIPITOR) 10 MG tablet, TAKE 1 TABLET BY MOUTH DAILY, Disp: 90 tablet, Rfl: 0   atorvastatin (LIPITOR) 10 MG tablet, Take 1 tablet by mouth daily., Disp: , Rfl:    ketotifen (ZADITOR) 0.025 % ophthalmic solution, Place 1 drop into both eyes 2 (two) times daily as needed., Disp: , Rfl:    levothyroxine (SYNTHROID, LEVOTHROID) 50 MCG tablet, Take 50 mcg by mouth daily before breakfast., Disp: , Rfl:    loratadine (CLARITIN) 10 MG tablet, Take 10 mg by mouth daily. Reported on 01/22/2016, Disp: , Rfl:    LORazepam (ATIVAN) 0.5 MG tablet, TAKE 1 TABLET(0.5 MG) BY MOUTH EVERY 6 HOURS AS NEEDED FOR ANXIETY, Disp: 120 tablet, Rfl: 0   losartan-hydrochlorothiazide (HYZAAR) 100-12.5 MG tablet, , Disp: , Rfl:    magnesium chloride (SLOW-MAG) 64 MG TBEC SR tablet, Take 1 tablet (64 mg total) by mouth daily., Disp: 30 tablet, Rfl: 0   metoprolol succinate (TOPROL-XL) 50 MG 24 hr tablet, Take 50 mg by mouth daily., Disp: , Rfl:    potassium chloride SA (KLOR-CON M) 20 MEQ tablet, Take 1 tablet (20 mEq total) by  mouth daily., Disp: 28 tablet, Rfl: 0   QUEtiapine (SEROQUEL) 25 MG tablet, Take 1 tablet (25 mg total) by mouth at bedtime., Disp: 30 tablet, Rfl: 2   sodium chloride 1 g tablet, TAKE 1 TABLET(1 GRAM) BY MOUTH THREE TIMES DAILY, Disp: 42 tablet, Rfl: 2   TRELEGY ELLIPTA 100-62.5-25 MCG/ACT AEPB, INHALE 1 PUFF INTO THE LUNGS DAILY, Disp: 60 each, Rfl: 3   loperamide (IMODIUM) 2 MG capsule, Take 2 mg by mouth as needed for diarrhea or loose stools. (Patient not taking: Reported on 09/15/2021), Disp: , Rfl:  No current facility-administered medications for this visit.  Facility-Administered Medications Ordered in Other Visits:    0.9 %  sodium chloride infusion, , Intravenous, Once, Earlie Server, MD   heparin lock flush 100 unit/mL, 500 Units, Intracatheter, Once PRN, Sindy Guadeloupe, MD   sodium chloride flush (NS) 0.9 % injection 10 mL, 10 mL, Intravenous, PRN, Sindy Guadeloupe, MD, 10 mL at 09/15/21 0944  Physical exam:  Vitals:   09/15/21 0955 09/15/21 1000  BP:  132/62  Pulse:  94  Resp:  18  Temp:  98 F (36.7 C)  TempSrc:  Tympanic  SpO2:  100%  Weight: 149 lb (67.6 kg)    Physical Exam Constitutional:      General: She is not in acute distress. Cardiovascular:     Rate and Rhythm: Normal rate and regular rhythm.     Heart sounds: Normal heart sounds.  Pulmonary:     Effort: Pulmonary effort is normal.     Breath sounds: Normal breath sounds.  Abdominal:     General: Bowel sounds are normal.     Palpations: Abdomen is soft.  Musculoskeletal:     Comments: Healing lesion noted over right lower extremity  Skin:    General: Skin is warm and dry.  Neurological:     Mental Status: She is alert and oriented to person, place, and time.     CMP Latest Ref Rng & Units 09/15/2021  Glucose 70 - 99 mg/dL 151(H)  BUN 8 - 23 mg/dL 11  Creatinine 0.44 - 1.00 mg/dL 0.96  Sodium 135 - 145 mmol/L 128(L)  Potassium 3.5 - 5.1 mmol/L  3.2(L)  Chloride 98 - 111 mmol/L 96(L)  CO2 22 - 32  mmol/L 24  Calcium 8.9 - 10.3 mg/dL 9.1  Total Protein 6.5 - 8.1 g/dL 6.9  Total Bilirubin 0.3 - 1.2 mg/dL 0.6  Alkaline Phos 38 - 126 U/L 51  AST 15 - 41 U/L 17  ALT 0 - 44 U/L 14   CBC Latest Ref Rng & Units 09/15/2021  WBC 4.0 - 10.5 K/uL 6.9  Hemoglobin 12.0 - 15.0 g/dL 10.6(L)  Hematocrit 36.0 - 46.0 % 30.4(L)  Platelets 150 - 400 K/uL 153    No images are attached to the encounter.  MR Brain W Wo Contrast  Result Date: 08/20/2021 CLINICAL DATA:  Small-cell lung cancer. History of brain metastases status post whole brain radiation. EXAM: MRI HEAD WITHOUT AND WITH CONTRAST TECHNIQUE: Multiplanar, multiecho pulse sequences of the brain and surrounding structures were obtained without and with intravenous contrast. CONTRAST:  50mL GADAVIST GADOBUTROL 1 MMOL/ML IV SOLN COMPARISON:  Head MRI 05/26/2021 FINDINGS: Brain: There is no evidence of an acute infarct, midline shift, or extra-axial fluid collection. Mild cerebral atrophy is within normal limits for age. Patchy and confluent T2 hyperintensities in the cerebral white matter bilaterally have greatly progressed and likely reflect post treatment changes. Patchy T2 hyperintensity in the pons is unchanged. Chronic microhemorrhages are again seen in the medial right parietal and posterior frontal lobes. A faint 2 mm focus of enhancement laterally in the right cerebellar hemisphere (series 1021, image 55) was not visible on the most recent prior study, however it corresponds to a previously treated lesion and is much smaller than on the 12/24/2020 pretreatment study. No new enhancing lesions are identified. Vascular: Major intracranial vascular flow voids are preserved. Skull and upper cervical spine: Unremarkable bone marrow signal para Sinuses/Orbits: Unremarkable orbits. New moderate mucosal thickening in the paranasal sinuses. Small volume fluid in the right maxillary sinus. Small right mastoid effusion. Other: None. IMPRESSION: 1. 2 mm focus of  enhancement in the right cerebellum corresponding to a previously treated metastasis. 2. No evidence of new intracranial metastases. 3. Progressive post treatment changes in the cerebral white matter. Electronically Signed   By: Logan Bores M.D.   On: 08/20/2021 08:58     Assessment and plan- Patient is a 76 y.o. 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  She is here for on treatment assessment prior to cycle 8 of palliative Tecentriq  Counts okay to proceed with cycle 8 of palliative Tecentriq today.  I will see her back in 3 weeks for cycle 9.  Plan toRepeat scans after 9 cycles.  Hyponatremia: Chronic continue to monitor and she is on salt tablets.  Hypokalemia: I am asked her to increase her potassium supplementation to twice a day for the next 5 days and following that she will go back to once a day.  Bone metastases: She will receive Zometa with next cycle   Visit Diagnosis 1. Small cell lung cancer in adult [facility] [place])   2. Bone metastases ([place])   3. Chronic hyponatremia   4. Encounter for antineoplastic immunotherapy      Dr. Randa Evens, MD, MPH [facility] at [facility] [phone] 09/15/2021 12:39 PM

## 2021-09-15 NOTE — Progress Notes (Signed)
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  Patient receiving tecentriq.  Patient has completed radiation to left lower lobe on 1/4.    Met with patient during infusion.  Reports that appetite is good but having lower back pain from recent UTI.  Has been trying to drink gatorade and boost soothe shakes.  Last night ate a banana sandwich for dinner and spaghetti for lunch.  Denies nutrition impact symptoms at this time.   Medications: reviewed  Labs: reviewed  Anthropometrics:   Weight 149 lb today  150 lb 1/3 151 lb on 11/22 149 lb on 10/11 139 lb on 8/9 145 lb on 7/5 140 lb on 6/28 139 lb on 5/31   NUTRITION DIAGNOSIS: Inadequate oral intake improved   INTERVENTION:  Encouraged well balanced diet with good sources of protein. Oral nutrition supplements as tolerated.      MONITORING, EVALUATION, GOAL: weight trends, intake   NEXT VISIT: as needed  Beth Mcmillan B. Beth Mcmillan, [place], [place] Registered Dietitian [phone] (mobile)

## 2021-09-15 NOTE — Progress Notes (Signed)
Patient states that she takes her last pill for a UTI today but she is still having back pain. Other UTI symptoms have resolved. Occasional toe numbness. She would like someone to look at her right leg where she hit it on the bedpost.

## 2021-09-15 NOTE — Patient Instructions (Signed)
[facility] AT [place]  Discharge Instructions: Thank you for choosing [facility] to provide your oncology and hematology care.  If you have a lab appointment with the [facility], please go directly to the [facility] and check in at the registration area.  Wear comfortable clothing and clothing appropriate for easy access to any Portacath or PICC line.   We strive to give you quality time with your provider. You may need to reschedule your appointment if you arrive late (15 or more minutes).  Arriving late affects you and other patients whose appointments are after yours.  Also, if you miss three or more appointments without notifying the office, you may be dismissed from the clinic at the providers discretion.      For prescription refill requests, have your pharmacy contact our office and allow 72 hours for refills to be completed.    Today you received the following chemotherapy and/or immunotherapy agents: Tecentriq   To help prevent nausea and vomiting after your treatment, we encourage you to take your nausea medication as directed.  BELOW ARE SYMPTOMS THAT SHOULD BE REPORTED IMMEDIATELY: *FEVER GREATER THAN 100.4 F (38 C) OR HIGHER *CHILLS OR SWEATING *NAUSEA AND VOMITING THAT IS NOT CONTROLLED WITH YOUR NAUSEA MEDICATION *UNUSUAL SHORTNESS OF BREATH *UNUSUAL BRUISING OR BLEEDING *URINARY PROBLEMS (pain or burning when urinating, or frequent urination) *BOWEL PROBLEMS (unusual diarrhea, constipation, pain near the anus) TENDERNESS IN MOUTH AND THROAT WITH OR WITHOUT PRESENCE OF ULCERS (sore throat, sores in mouth, or a toothache) UNUSUAL RASH, SWELLING OR PAIN  UNUSUAL VAGINAL DISCHARGE OR ITCHING   Items with * indicate a potential emergency and should be followed up as soon as possible or go to the Emergency Department if any problems should occur.  Please show the CHEMOTHERAPY ALERT CARD or IMMUNOTHERAPY ALERT CARD at check-in to the  Emergency Department and triage nurse.  Should you have questions after your visit or need to cancel or reschedule your appointment, please contact [facility] [place] AT [place]  [phone] and follow the prompts.  Office hours are 8:00 a.m. to 4:30 p.m. Monday - Friday. Please note that voicemails left after 4:00 p.m. may not be returned until the following business day.  We are closed weekends and major holidays. You have access to a nurse at all times for urgent questions. Please call the main number to the clinic [phone] and follow the prompts.  For any non-urgent questions, you may also contact your provider using MyChart. We now offer e-Visits for anyone 25 and older to request care online for non-urgent symptoms. For details visit [email or web address].   Also download the MyChart app! Go to the app store, search "MyChart", open the app, select [facility], and log in with your MyChart username and password.  Due to Covid, a mask is required upon entering the hospital/clinic. If you do not have a mask, one will be given to you upon arrival. For doctor visits, patients may have 1 support person aged 67 or older with them. For treatment visits, patients cannot have anyone with them due to current Covid guidelines and our immunocompromised population.

## 2021-09-27 ENCOUNTER — Encounter: Payer: Self-pay | Admitting: Oncology

## 2021-09-27 ENCOUNTER — Other Ambulatory Visit: Payer: Self-pay | Admitting: Oncology

## 2021-09-28 ENCOUNTER — Encounter: Payer: Self-pay | Admitting: Oncology

## 2021-09-28 ENCOUNTER — Other Ambulatory Visit: Payer: Self-pay | Admitting: Oncology

## 2021-09-28 NOTE — Telephone Encounter (Signed)
°  Component Ref Range & Units 13 d ago (09/15/21) 1 mo ago (08/25/21) 1 mo ago (08/04/21) 2 mo ago (07/14/21) 3 mo ago (06/23/21) 3 mo ago (06/02/21) 4 mo ago (05/12/21)  Potassium 3.5 - 5.1 mmol/L 3.2 Low   3.5  3.2 Low   3.5  3.6  3.5  3.7

## 2021-10-01 ENCOUNTER — Other Ambulatory Visit: Payer: Self-pay

## 2021-10-01 ENCOUNTER — Encounter: Payer: Self-pay | Admitting: Radiation Oncology

## 2021-10-01 ENCOUNTER — Ambulatory Visit
Admission: RE | Admit: 2021-10-01 | Discharge: 2021-10-01 | Disposition: A | Discharge status: Home / Self Care | Payer: Medicare Other | Source: Ambulatory Visit | Attending: Radiation Oncology | Admitting: Radiation Oncology

## 2021-10-01 DIAGNOSIS — C3490 Malignant neoplasm of unspecified part of unspecified bronchus or lung: Secondary | ICD-10-CM

## 2021-10-01 DIAGNOSIS — C7931 Secondary malignant neoplasm of brain: Secondary | ICD-10-CM | POA: Diagnosis not present

## 2021-10-01 DIAGNOSIS — C7951 Secondary malignant neoplasm of bone: Secondary | ICD-10-CM | POA: Diagnosis not present

## 2021-10-01 DIAGNOSIS — C3432 Malignant neoplasm of lower lobe, left bronchus or lung: Secondary | ICD-10-CM | POA: Diagnosis not present

## 2021-10-01 DIAGNOSIS — Z923 Personal history of irradiation: Secondary | ICD-10-CM | POA: Insufficient documentation

## 2021-10-01 NOTE — Progress Notes (Signed)
Radiation Oncology Follow up Note  Name: Beth Mcmillan   Date:   10/01/2021 MRN:  [id number] DOB: April 24, 1946    This 76 y.o. female presents to the clinic today for 1 month follow-up status post SBRT to her left lower lobe.  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  We recently treated with SBRT to her chest.  Her left lower lobe.  She tolerated that extremely well a CT and a follow-up 1 month out.  Specifically denies cough hemoptysis or chest tightness she had extremely low side effect profile from the treatment..  She is currently is on maintenance Tecentriq  COMPLICATIONS OF TREATMENT: none  FOLLOW UP COMPLIANCE: keeps appointments   PHYSICAL EXAM:  BP (!) (P) 148/74 (BP Location: Left Arm, Patient Position: Sitting)    Pulse (P) 99    Temp (!) (P) 97.2 F (36.2 C) (Tympanic)    Resp (P) 16    Wt (P) 151 lb 11.2 oz (68.8 kg)    BMI (P) 23.76 kg/m  Well-developed well-nourished patient in NAD. HEENT reveals PERLA, EOMI, discs not visualized.  Oral cavity is clear. No oral mucosal lesions are identified. Neck is clear without evidence of cervical or supraclavicular adenopathy. Lungs are clear to A&P. Cardiac examination is essentially unremarkable with regular rate and rhythm without murmur rub or thrill. Abdomen is benign with no organomegaly or masses noted. Motor sensory and DTR levels are equal and symmetric in the upper and lower extremities. Cranial nerves II through XII are grossly intact. Proprioception is intact. No peripheral adenopathy or edema is identified. No motor or sensory levels are noted. Crude visual fields are within normal range.  RADIOLOGY RESULTS: CT scan has been ordered  PLAN: Present time she is doing well very low side effect profile.  I have asked to see her back in 3 months for follow-up she will have a CT  scan prior to that visit.  She continues close follow-up care with medical oncology.  She continues on maintenance Tecentriq under medical oncology's direction.  I would like to take this opportunity to thank you for allowing me to participate in the care of your patient.Noreene Filbert, MD

## 2021-10-05 ENCOUNTER — Encounter: Payer: Self-pay | Admitting: Oncology

## 2021-10-06 ENCOUNTER — Inpatient Hospital Stay (HOSPITAL_BASED_OUTPATIENT_CLINIC_OR_DEPARTMENT_OTHER): Payer: Medicare Other | Admitting: Oncology

## 2021-10-06 ENCOUNTER — Encounter: Payer: Self-pay | Admitting: Oncology

## 2021-10-06 ENCOUNTER — Inpatient Hospital Stay: Payer: Medicare Other | Attending: Oncology

## 2021-10-06 ENCOUNTER — Other Ambulatory Visit: Payer: Self-pay

## 2021-10-06 ENCOUNTER — Inpatient Hospital Stay: Payer: Medicare Other

## 2021-10-06 ENCOUNTER — Encounter: Payer: Self-pay | Admitting: *Deleted

## 2021-10-06 VITALS — BP 124/62 | HR 80 | Temp 98.4°F | Resp 16 | Ht 67.0 in | Wt 150.0 lb

## 2021-10-06 DIAGNOSIS — C3490 Malignant neoplasm of unspecified part of unspecified bronchus or lung: Secondary | ICD-10-CM

## 2021-10-06 DIAGNOSIS — Z818 Family history of other mental and behavioral disorders: Secondary | ICD-10-CM | POA: Diagnosis not present

## 2021-10-06 DIAGNOSIS — Z8349 Family history of other endocrine, nutritional and metabolic diseases: Secondary | ICD-10-CM | POA: Diagnosis not present

## 2021-10-06 DIAGNOSIS — Z885 Allergy status to narcotic agent status: Secondary | ICD-10-CM | POA: Insufficient documentation

## 2021-10-06 DIAGNOSIS — C7951 Secondary malignant neoplasm of bone: Secondary | ICD-10-CM

## 2021-10-06 DIAGNOSIS — Z923 Personal history of irradiation: Secondary | ICD-10-CM | POA: Diagnosis not present

## 2021-10-06 DIAGNOSIS — C3432 Malignant neoplasm of lower lobe, left bronchus or lung: Secondary | ICD-10-CM | POA: Diagnosis not present

## 2021-10-06 DIAGNOSIS — E871 Hypo-osmolality and hyponatremia: Secondary | ICD-10-CM

## 2021-10-06 DIAGNOSIS — Z8049 Family history of malignant neoplasm of other genital organs: Secondary | ICD-10-CM | POA: Insufficient documentation

## 2021-10-06 DIAGNOSIS — Z5181 Encounter for therapeutic drug level monitoring: Secondary | ICD-10-CM | POA: Diagnosis not present

## 2021-10-06 DIAGNOSIS — R5383 Other fatigue: Secondary | ICD-10-CM | POA: Insufficient documentation

## 2021-10-06 DIAGNOSIS — I10 Essential (primary) hypertension: Secondary | ICD-10-CM | POA: Insufficient documentation

## 2021-10-06 DIAGNOSIS — Z801 Family history of malignant neoplasm of trachea, bronchus and lung: Secondary | ICD-10-CM | POA: Diagnosis not present

## 2021-10-06 DIAGNOSIS — Z8719 Personal history of other diseases of the digestive system: Secondary | ICD-10-CM | POA: Diagnosis not present

## 2021-10-06 DIAGNOSIS — Z5112 Encounter for antineoplastic immunotherapy: Secondary | ICD-10-CM | POA: Diagnosis not present

## 2021-10-06 DIAGNOSIS — Z7983 Long term (current) use of bisphosphonates: Secondary | ICD-10-CM

## 2021-10-06 DIAGNOSIS — C7931 Secondary malignant neoplasm of brain: Secondary | ICD-10-CM | POA: Insufficient documentation

## 2021-10-06 DIAGNOSIS — Z79899 Other long term (current) drug therapy: Secondary | ICD-10-CM | POA: Insufficient documentation

## 2021-10-06 DIAGNOSIS — Z888 Allergy status to other drugs, medicaments and biological substances status: Secondary | ICD-10-CM | POA: Diagnosis not present

## 2021-10-06 DIAGNOSIS — Z880 Allergy status to penicillin: Secondary | ICD-10-CM | POA: Diagnosis not present

## 2021-10-06 DIAGNOSIS — Z881 Allergy status to other antibiotic agents status: Secondary | ICD-10-CM | POA: Insufficient documentation

## 2021-10-06 DIAGNOSIS — Z8249 Family history of ischemic heart disease and other diseases of the circulatory system: Secondary | ICD-10-CM | POA: Insufficient documentation

## 2021-10-06 DIAGNOSIS — Z882 Allergy status to sulfonamides status: Secondary | ICD-10-CM | POA: Insufficient documentation

## 2021-10-06 DIAGNOSIS — E876 Hypokalemia: Secondary | ICD-10-CM

## 2021-10-06 LAB — CBC WITH DIFFERENTIAL/PLATELET
Abs Immature Granulocytes: 0.06 10*3/uL (ref 0.00–0.07)
Basophils Absolute: 0 10*3/uL (ref 0.0–0.1)
Basophils Relative: 0 %
Eosinophils Absolute: 0.8 10*3/uL — ABNORMAL HIGH (ref 0.0–0.5)
Eosinophils Relative: 12 %
HCT: 29.7 % — ABNORMAL LOW (ref 36.0–46.0)
Hemoglobin: 10.1 g/dL — ABNORMAL LOW (ref 12.0–15.0)
Immature Granulocytes: 1 %
Lymphocytes Relative: 12 %
Lymphs Abs: 0.7 10*3/uL (ref 0.7–4.0)
MCH: 32.8 pg (ref 26.0–34.0)
MCHC: 34 g/dL (ref 30.0–36.0)
MCV: 96.4 fL (ref 80.0–100.0)
Monocytes Absolute: 0.5 10*3/uL (ref 0.1–1.0)
Monocytes Relative: 8 %
Neutro Abs: 4.2 10*3/uL (ref 1.7–7.7)
Neutrophils Relative %: 67 %
Platelets: 192 10*3/uL (ref 150–400)
RBC: 3.08 MIL/uL — ABNORMAL LOW (ref 3.87–5.11)
RDW: 14.9 % (ref 11.5–15.5)
WBC: 6.3 10*3/uL (ref 4.0–10.5)
nRBC: 0 % (ref 0.0–0.2)

## 2021-10-06 LAB — COMPREHENSIVE METABOLIC PANEL
ALT: 16 U/L (ref 0–44)
AST: 17 U/L (ref 15–41)
Albumin: 3.5 g/dL (ref 3.5–5.0)
Alkaline Phosphatase: 61 U/L (ref 38–126)
Anion gap: 7 (ref 5–15)
BUN: 15 mg/dL (ref 8–23)
CO2: 24 mmol/L (ref 22–32)
Calcium: 8.8 mg/dL — ABNORMAL LOW (ref 8.9–10.3)
Chloride: 97 mmol/L — ABNORMAL LOW (ref 98–111)
Creatinine, Ser: 0.97 mg/dL (ref 0.44–1.00)
GFR, Estimated: >60 mL/min (ref >60)
Glucose, Bld: 154 mg/dL — ABNORMAL HIGH (ref 70–99)
Potassium: 3.5 mmol/L (ref 3.5–5.1)
Sodium: 128 mmol/L — ABNORMAL LOW (ref 135–145)
Total Bilirubin: 0.3 mg/dL (ref 0.3–1.2)
Total Protein: 6.6 g/dL (ref 6.5–8.1)

## 2021-10-06 MED ORDER — SODIUM CHLORIDE 0.9 % IV SOLN
Freq: Once | INTRAVENOUS | Status: AC
  Filled 2021-10-06: qty 250

## 2021-10-06 MED ORDER — HEPARIN SOD (PORK) LOCK FLUSH 100 UNIT/ML IV SOLN
500.0000 [IU] | Freq: Once | INTRAVENOUS | Status: AC | PRN
  Administered 2021-10-06: 500 [IU]
  Filled 2021-10-06: qty 5

## 2021-10-06 MED ORDER — SODIUM CHLORIDE 0.9 % IV SOLN
1200.0000 mg | Freq: Once | INTRAVENOUS | Status: AC
  Administered 2021-10-06: 1200 mg via INTRAVENOUS
  Filled 2021-10-06: qty 20

## 2021-10-06 MED ORDER — ZOLEDRONIC ACID 4 MG/5ML IV CONC
3.5000 mg | INTRAVENOUS | Status: DC
  Administered 2021-10-06: 3.5 mg via INTRAVENOUS
  Filled 2021-10-06: qty 4.38

## 2021-10-06 NOTE — Patient Instructions (Signed)
[facility] [place]  Discharge Instructions: Thank you for choosing [facility] to provide your oncology and hematology care.  If you have a lab appointment with the [facility], please go directly to the [facility] and check in at the registration area.  Wear comfortable clothing and clothing appropriate for easy access to any Portacath or PICC line.   We strive to give you quality time with your provider. You may need to reschedule your appointment if you arrive late (15 or more minutes).  Arriving late affects you and other patients whose appointments are after yours.  Also, if you miss three or more appointments without notifying the office, you may be dismissed from the clinic at the providers discretion.      For prescription refill requests, have your pharmacy contact our office and allow 72 hours for refills to be completed.    Today you received the following chemotherapy and/or immunotherapy agents TECENTRIQ and ZOMETA      To help prevent nausea and vomiting after your treatment, we encourage you to take your nausea medication as directed.  BELOW ARE SYMPTOMS THAT SHOULD BE REPORTED IMMEDIATELY: *FEVER GREATER THAN 100.4 F (38 C) OR HIGHER *CHILLS OR SWEATING *NAUSEA AND VOMITING THAT IS NOT CONTROLLED WITH YOUR NAUSEA MEDICATION *UNUSUAL SHORTNESS OF BREATH *UNUSUAL BRUISING OR BLEEDING *URINARY PROBLEMS (pain or burning when urinating, or frequent urination) *BOWEL PROBLEMS (unusual diarrhea, constipation, pain near the anus) TENDERNESS IN MOUTH AND THROAT WITH OR WITHOUT PRESENCE OF ULCERS (sore throat, sores in mouth, or a toothache) UNUSUAL RASH, SWELLING OR PAIN  UNUSUAL VAGINAL DISCHARGE OR ITCHING   Items with * indicate a potential emergency and should be followed up as soon as possible or go to the Emergency Department if any problems should occur.  Please show the CHEMOTHERAPY ALERT CARD or IMMUNOTHERAPY ALERT CARD at  check-in to the Emergency Department and triage nurse.  Should you have questions after your visit or need to cancel or reschedule your appointment, please contact [facility] [place] [facility] [place]  [phone] and follow the prompts.  Office hours are 8:00 a.m. to 4:30 p.m. Monday - Friday. Please note that voicemails left after 4:00 p.m. may not be returned until the following business day.  We are closed weekends and major holidays. You have access to a nurse at all times for urgent questions. Please call the main number to the clinic [phone] and follow the prompts.  For any non-urgent questions, you may also contact your provider using MyChart. We now offer e-Visits for anyone 69 and older to request care online for non-urgent symptoms. For details visit [email or web address].   Also download the MyChart app! Go to the app store, search "MyChart", open the app, select [place], and log in with your MyChart username and password.  Due to Covid, a mask is required upon entering the hospital/clinic. If you do not have a mask, one will be given to you upon arrival. For doctor visits, patients may have 1 support person aged 54 or older with them. For treatment visits, patients cannot have anyone with them due to current Covid guidelines and our immunocompromised population.   Atezolizumab injection What is this medication? ATEZOLIZUMAB (a te zoe LIZ ue mab) is a monoclonal antibody. It is used to treat bladder cancer (urothelial cancer), liver cancer, lung cancer, and melanoma. This medicine may be used for other purposes; ask your health care provider or pharmacist if you have questions. COMMON BRAND  NAME(S): Tecentriq What should I tell my care team before I take this medication? They need to know if you have any of these conditions: autoimmune diseases like Crohn's disease, ulcerative colitis, or lupus have had or planning to have an allogeneic stem cell transplant (uses  someone else's stem cells) history of organ transplant history of radiation to the chest nervous system problems like myasthenia gravis or Guillain-Barre syndrome an unusual or allergic reaction to atezolizumab, other medicines, foods, dyes, or preservatives pregnant or trying to get pregnant breast-feeding How should I use this medication? This medicine is for infusion into a vein. It is given by a health care professional in a hospital or clinic setting. A special MedGuide will be given to you before each treatment. Be sure to read this information carefully each time. Talk to your pediatrician regarding the use of this medicine in children. Special care may be needed. Overdosage: If you think you have taken too much of this medicine contact a poison control center or emergency room at once. NOTE: This medicine is only for you. Do not share this medicine with others. What if I miss a dose? It is important not to miss your dose. Call your doctor or health care professional if you are unable to keep an appointment. What may interact with this medication? Interactions have not been studied. This list may not describe all possible interactions. Give your health care provider a list of all the medicines, herbs, non-prescription drugs, or dietary supplements you use. Also tell them if you smoke, drink alcohol, or use illegal drugs. Some items may interact with your medicine. What should I watch for while using this medication? Your condition will be monitored carefully while you are receiving this medicine. You may need blood work done while you are taking this medicine. Do not become pregnant while taking this medicine or for at least 5 months after stopping it. Women should inform their doctor if they wish to become pregnant or think they might be pregnant. There is a potential for serious side effects to an unborn child. Talk to your health care professional or pharmacist for more information. Do  not breast-feed an infant while taking this medicine or for at least 5 months after the last dose. What side effects may I notice from receiving this medication? Side effects that you should report to your doctor or health care professional as soon as possible: allergic reactions like skin rash, itching or hives, swelling of the face, lips, or tongue black, tarry stools bloody or watery diarrhea breathing problems changes in vision chest pain or chest tightness chills facial flushing fever headache signs and symptoms of high blood sugar such as dizziness; dry mouth; dry skin; fruity breath; nausea; stomach pain; increased hunger or thirst; increased urination signs and symptoms of liver injury like dark yellow or brown urine; general ill feeling or flu-like symptoms; light-colored stools; loss of appetite; nausea; right upper belly pain; unusually weak or tired; yellowing of the eyes or skin stomach pain trouble passing urine or change in the amount of urine Side effects that usually do not require medical attention (report to your doctor or health care professional if they continue or are bothersome): bone pain cough diarrhea joint pain muscle pain muscle weakness swelling of arms or legs tiredness weight loss This list may not describe all possible side effects. Call your doctor for medical advice about side effects. You may report side effects to FDA at 1-800-FDA-1088. Where should I keep my medication?  This drug is given in a hospital or clinic and will not be stored at home. NOTE: This sheet is a summary. It may not cover all possible information. If you have questions about this medicine, talk to your doctor, pharmacist, or health care provider.  2022 Elsevier/Gold Standard (2021-04-28 00:00:00)  Zoledronic Acid Injection (Hypercalcemia, Oncology) What is this medication? ZOLEDRONIC ACID (ZOE le dron ik AS id) slows calcium loss from bones. It high calcium levels in the  blood from some kinds of cancer. It may be used in other people at risk for bone loss. This medicine may be used for other purposes; ask your health care provider or pharmacist if you have questions. COMMON BRAND NAME(S): Zometa What should I tell my care team before I take this medication? They need to know if you have any of these conditions: cancer dehydration dental disease kidney disease liver disease low levels of calcium in the blood lung or breathing disease (asthma) receiving steroids like dexamethasone or prednisone an unusual or allergic reaction to zoledronic acid, other medicines, foods, dyes, or preservatives pregnant or trying to get pregnant breast-feeding How should I use this medication? This drug is injected into a vein. It is given by a health care provider in a hospital or clinic setting. Talk to your health care provider about the use of this drug in children. Special care may be needed. Overdosage: If you think you have taken too much of this medicine contact a poison control center or emergency room at once. NOTE: This medicine is only for you. Do not share this medicine with others. What if I miss a dose? Keep appointments for follow-up doses. It is important not to miss your dose. Call your health care provider if you are unable to keep an appointment. What may interact with this medication? certain antibiotics given by injection NSAIDs, medicines for pain and inflammation, like ibuprofen or naproxen some diuretics like bumetanide, furosemide teriparatide thalidomide This list may not describe all possible interactions. Give your health care provider a list of all the medicines, herbs, non-prescription drugs, or dietary supplements you use. Also tell them if you smoke, drink alcohol, or use illegal drugs. Some items may interact with your medicine. What should I watch for while using this medication? Visit your health care provider for regular checks on your  progress. It may be some time before you see the benefit from this drug. Some people who take this drug have severe bone, joint, or muscle pain. This drug may also increase your risk for jaw problems or a broken thigh bone. Tell your health care provider right away if you have severe pain in your jaw, bones, joints, or muscles. Tell you health care provider if you have any pain that does not go away or that gets worse. Tell your dentist and dental surgeon that you are taking this drug. You should not have major dental surgery while on this drug. See your dentist to have a dental exam and fix any dental problems before starting this drug. Take good care of your teeth while on this drug. Make sure you see your dentist for regular follow-up appointments. You should make sure you get enough calcium and vitamin D while you are taking this drug. Discuss the foods you eat and the vitamins you take with your health care provider. Check with your health care provider if you have severe diarrhea, nausea, and vomiting, or if you sweat a lot. The loss of too much body fluid may  make it dangerous for you to take this drug. You may need blood work done while you are taking this drug. Do not become pregnant while taking this drug. Women should inform their health care provider if they wish to become pregnant or think they might be pregnant. There is potential for serious harm to an unborn child. Talk to your health care provider for more information. What side effects may I notice from receiving this medication? Side effects that you should report to your doctor or health care provider as soon as possible: allergic reactions (skin rash, itching or hives; swelling of the face, lips, or tongue) bone pain infection (fever, chills, cough, sore throat, pain or trouble passing urine) jaw pain, especially after dental work joint pain kidney injury (trouble passing urine or change in the amount of urine) low blood pressure  (dizziness; feeling faint or lightheaded, falls; unusually weak or tired) low calcium levels (fast heartbeat; muscle cramps or pain; pain, tingling, or numbness in the hands or feet; seizures) low magnesium levels (fast, irregular heartbeat; muscle cramp or pain; muscle weakness; tremors; seizures) low red blood cell counts (trouble breathing; feeling faint; lightheaded, falls; unusually weak or tired) muscle pain redness, blistering, peeling, or loosening of the skin, including inside the mouth severe diarrhea swelling of the ankles, feet, hands trouble breathing Side effects that usually do not require medical attention (report to your doctor or health care provider if they continue or are bothersome): anxious constipation coughing depressed mood eye irritation, itching, or pain fever general ill feeling or flu-like symptoms nausea pain, redness, or irritation at site where injected trouble sleeping This list may not describe all possible side effects. Call your doctor for medical advice about side effects. You may report side effects to FDA at 1-800-FDA-1088. Where should I keep my medication? This drug is given in a hospital or clinic. It will not be stored at home. NOTE: This sheet is a summary. It may not cover all possible information. If you have questions about this medicine, talk to your doctor, pharmacist, or health care provider.  2022 Elsevier/Gold Standard (2021-04-28 00:00:00)

## 2021-10-06 NOTE — Progress Notes (Signed)
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  She underwent CT chest with contrast in March 2022 following an abnormal x-ray which showed a 2.6 cm mass in the left lower lobe.  This was followed by a PET CT scan which showed a 3.0 x 2.0 cm mass in the left lower lobe of the lung with an SUV of 9.7.  No evidence of locoregional adenopathy.  No evidence of intra-abdominal disease with patient was noted to have a hypermetabolic focus in the right iliac bone with an SUV of 11.1 which was suspicious for metastatic disease.  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  Cells were positive for TTF-1, CD56 and chromogranin.  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  Patient completed whole brain radiation treatment and completed carbo etoposide Tecentriq 4 cycles on 03/10/2021.  Interval history-patient had a fall while going to the bathroom and hit the left side of her face.  She was also  having UTI symptoms and was started on Macrobid for 7 days.  ECOG PS- 1 Pain scale- 0   Review of systems- Review of Systems  Constitutional:  Positive for malaise/fatigue. Negative for chills, fever and weight loss.  HENT:  Negative for congestion, ear discharge and nosebleeds.   Eyes:  Negative for blurred vision.  Respiratory:  Negative for cough, hemoptysis, sputum production, shortness of breath and wheezing.   Cardiovascular:  Negative for chest pain, palpitations, orthopnea and claudication.  Gastrointestinal:  Negative for abdominal pain, blood in stool, constipation, diarrhea, heartburn, melena, nausea and vomiting.  Genitourinary:  Negative for dysuria, flank pain, frequency, hematuria and urgency.  Musculoskeletal:  Negative for back pain, joint pain and myalgias.  Skin:  Negative for rash.  Neurological:  Negative for dizziness, tingling, focal weakness, seizures, weakness and headaches.  Endo/Heme/Allergies:  Does not bruise/bleed easily.  Psychiatric/Behavioral:  Negative for depression and suicidal ideas. The patient does not have insomnia.      Allergies  Allergen Reactions   Augmentin [Amoxicillin-Pot Clavulanate]     "messes up my blood cells"   Codeine Nausea Only   Levaquin [Levofloxacin]     Delusions    Sulfa Antibiotics Nausea And Vomiting   Vancomycin Other (See Comments)    Redman syndome- happened in the hospital at [facility]   Tape Rash    Some bandaids cause rash sometimes     Past Medical History:  Diagnosis Date   Anxiety    Breathing problem    low breathing function 53%   COPD (chronic obstructive pulmonary disease) ([facility])  EMPHYSEMA   Dysrhythmia    tachycardia   Emphysema of lung (HCC)    Hemorrhoid    History of hiatal hernia    SMALL- 2022   Hypercholesteremia    Hypertension    Hypothyroidism    Larynx polyp    Lung cancer (HCC)    Palpitations    with anxiety   Platelets decreased ([place]) 2020   Seasonal allergies    Tinnitus     Vertigo    no episodes in several years   Wears dentures    partial lower   White coat syndrome with hypertension      Past Surgical History:  Procedure Laterality Date   ABDOMINAL HYSTERECTOMY     partial   COLONOSCOPY WITH PROPOFOL N/A 01/22/2016   Procedure: COLONOSCOPY WITH PROPOFOL;  Surgeon: Lucilla Lame, MD;  Location: [place];  Service: Endoscopy;  Laterality: N/A;   COLONOSCOPY WITH PROPOFOL N/A 04/12/2019   Procedure: COLONOSCOPY WITH BIOPSY;  Surgeon: Lucilla Lame, MD;  Location: [place];  Service: Endoscopy;  Laterality: N/A;  pt would like an early appt   ct guided lung bx     IR IMAGING GUIDED PORT INSERTION  12/18/2020   POLYPECTOMY  01/22/2016   Procedure: POLYPECTOMY;  Surgeon: Lucilla Lame, MD;  Location: [place];  Service: Endoscopy;;   POLYPECTOMY N/A 04/12/2019   Procedure: POLYPECTOMY;  Surgeon: Lucilla Lame, MD;  Location: [place];  Service: Endoscopy;  Laterality: N/A;   VIDEO BRONCHOSCOPY WITH ENDOBRONCHIAL NAVIGATION N/A 11/21/2020   Procedure: VIDEO BRONCHOSCOPY WITH ENDOBRONCHIAL NAVIGATION;  Surgeon: Ottie Glazier, MD;  Location: [facility];  Service: Thoracic;  Laterality: N/A;   VIDEO BRONCHOSCOPY WITH ENDOBRONCHIAL ULTRASOUND N/A 11/21/2020   Procedure: VIDEO BRONCHOSCOPY WITH ENDOBRONCHIAL ULTRASOUND;  Surgeon: Ottie Glazier, MD;  Location: [facility];  Service: Thoracic;  Laterality: N/A;    Social History   Socioeconomic History   Marital status: Married    Spouse name: Not on file   Number of children: Not on file   Years of education: Not on file   Highest education level: Not on file  Occupational History   Not on file  Tobacco Use   Smoking status: Former    Packs/day: 0.50    Years: 50.00    Pack years: 25.00    Types: Cigarettes    Quit date: 11/03/2020    Years since quitting: 0.9   Smokeless tobacco: Never  Vaping Use   Vaping Use: Some days  Substance and Sexual Activity   Alcohol use:  No   Drug use: Never   Sexual activity: Not on file  Other Topics Concern   Not on file  Social History Narrative   Not on file   Social Determinants of Health   Financial Resource Strain: Not on file  Food Insecurity: Not on file  Transportation Needs: Not on file  Physical Activity: Not on file  Stress: Not on file  Social Connections: Not on file  Intimate Partner Violence: Not on file    Family History  Problem Relation Age of Onset   Alzheimer's disease Mother    Heart attack Mother    High Cholesterol Mother    Hypertension Mother    Heart block Mother    Lung cancer Father    Uterine cancer Sister    Hypertension Sister    Heart block Sister    Anxiety disorder Sister    Breast cancer Neg Hx      Current  Outpatient Medications:    acetaminophen (TYLENOL) 500 MG tablet, Take 500 mg by mouth every 6 (six) hours as needed for mild pain or moderate pain., Disp: , Rfl:    atorvastatin (LIPITOR) 10 MG tablet, Take 1 tablet by mouth daily., Disp: , Rfl:    ketotifen (ZADITOR) 0.025 % ophthalmic solution, Place 1 drop into both eyes 2 (two) times daily as needed., Disp: , Rfl:    levothyroxine (SYNTHROID, LEVOTHROID) 50 MCG tablet, Take 50 mcg by mouth daily before breakfast., Disp: , Rfl:    loratadine (CLARITIN) 10 MG tablet, Take 10 mg by mouth daily. Reported on 01/22/2016, Disp: , Rfl:    LORazepam (ATIVAN) 0.5 MG tablet, TAKE 1 TABLET(0.5 MG) BY MOUTH EVERY 6 HOURS AS NEEDED FOR ANXIETY, Disp: 120 tablet, Rfl: 0   losartan-hydrochlorothiazide (HYZAAR) 100-12.5 MG tablet, , Disp: , Rfl:    magnesium chloride (SLOW-MAG) 64 MG TBEC SR tablet, Take 1 tablet (64 mg total) by mouth daily., Disp: 30 tablet, Rfl: 0   metoprolol succinate (TOPROL-XL) 50 MG 24 hr tablet, Take 50 mg by mouth daily., Disp: , Rfl:    nitrofurantoin (MACRODANTIN) 100 MG capsule, , Disp: , Rfl:    potassium chloride SA (KLOR-CON M) 20 MEQ tablet, TAKE 1 TABLET BY MOUTH DAILY, Disp: 28 tablet, Rfl:  0   QUEtiapine (SEROQUEL) 25 MG tablet, Take 1 tablet (25 mg total) by mouth at bedtime., Disp: 30 tablet, Rfl: 2   TRELEGY ELLIPTA 100-62.5-25 MCG/ACT AEPB, INHALE 1 PUFF INTO THE LUNGS DAILY, Disp: 60 each, Rfl: 3   loperamide (IMODIUM) 2 MG capsule, Take 2 mg by mouth as needed for diarrhea or loose stools. (Patient not taking: Reported on 09/15/2021), Disp: , Rfl:    VITAMIN MIXTURE PO, Take 1 tablet by mouth in the morning, at noon, and at bedtime. Balance of nature - fruit, Disp: , Rfl:    VITAMIN MIXTURE PO, Take 1 tablet by mouth in the morning, at noon, and at bedtime. Balance of nature- veggie, Disp: , Rfl:  No current facility-administered medications for this visit.  Facility-Administered Medications Ordered in Other Visits:    0.9 %  sodium chloride infusion, , Intravenous, Once, Earlie Server, MD   zoledronic acid (ZOMETA) 3.5 mg in sodium chloride 0.9 % 100 mL IVPB, 3.5 mg, Intravenous, Q28 days, Sindy Guadeloupe, MD, Stopped at 10/06/21 1044  Physical exam:  Vitals:   10/06/21 0905  BP: 124/62  Pulse: 80  Resp: 16  Temp: 98.4 F (36.9 C)  TempSrc: Tympanic  SpO2: 100%  Weight: 150 lb (68 kg)  Height: 5\' 7"  (1.702 m)   Physical Exam Constitutional:      General: She is not in acute distress. HENT:     Head:     Comments: Bruising noted over the left side of her face and around the left orbit Cardiovascular:     Rate and Rhythm: Normal rate and regular rhythm.     Heart sounds: Normal heart sounds.  Pulmonary:     Effort: Pulmonary effort is normal.     Breath sounds: Normal breath sounds.  Abdominal:     General: Bowel sounds are normal.     Palpations: Abdomen is soft.  Musculoskeletal:     Comments: Superficial skin tear noted over the right anterior part of the leg  Skin:    General: Skin is warm and dry.  Neurological:     Mental Status: She is alert and oriented to person, place, and time.  CMP Latest Ref Rng & Units 10/06/2021  Glucose 70 - 99 mg/dL  154(H)  BUN 8 - 23 mg/dL 15  Creatinine 0.44 - 1.00 mg/dL 0.97  Sodium 135 - 145 mmol/L 128(L)  Potassium 3.5 - 5.1 mmol/L 3.5  Chloride 98 - 111 mmol/L 97(L)  CO2 22 - 32 mmol/L 24  Calcium 8.9 - 10.3 mg/dL 8.8(L)  Total Protein 6.5 - 8.1 g/dL 6.6  Total Bilirubin 0.3 - 1.2 mg/dL 0.3  Alkaline Phos 38 - 126 U/L 61  AST 15 - 41 U/L 17  ALT 0 - 44 U/L 16   CBC Latest Ref Rng & Units 10/06/2021  WBC 4.0 - 10.5 K/uL 6.3  Hemoglobin 12.0 - 15.0 g/dL 10.1(L)  Hematocrit 36.0 - 46.0 % 29.7(L)  Platelets 150 - 400 K/uL 192     Assessment and plan- Patient is a 76 y.o. 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  She is here for on treatment assessment prior to cycle 9 of palliative Tecentriq  Counts okay to proceed with cycle 9 of palliative Tecentriq today.I will see her back in 3 weeks for cycle 10.  Repeat CT chest abdomen pelvis with contrast and bone scan is scheduled for next week.  Bone metastases: Calcium 8.8 and she will proceed with Zometa today.  Patient tells me that her Trelegy inhaler is expensive and she is looking for other alternatives.  This was originally prescribed by Dr. Lanney Gins and we will let patient know that she would need to get in touch with Dr. Doy Hutching for Dr. Lanney Gins for alternatives   Visit Diagnosis 1. Encounter for antineoplastic immunotherapy   2. Small cell lung cancer in adult ([place])   3. Bone metastases ([place])   4. Encounter for monitoring zoledronic acid therapy      Dr. Randa Evens, MD, MPH [facility] [phone] 10/06/2021 1:21 PM

## 2021-10-12 ENCOUNTER — Ambulatory Visit
Admission: RE | Admit: 2021-10-12 | Discharge: 2021-10-12 | Disposition: A | Discharge status: Home / Self Care | Payer: Medicare Other | Source: Ambulatory Visit | Attending: Oncology | Admitting: Oncology

## 2021-10-12 ENCOUNTER — Encounter
Admission: RE | Admit: 2021-10-12 | Discharge: 2021-10-12 | Disposition: A | Discharge status: Home / Self Care | Payer: Medicare Other | Source: Ambulatory Visit | Attending: Oncology | Admitting: Oncology

## 2021-10-12 ENCOUNTER — Other Ambulatory Visit: Payer: Self-pay

## 2021-10-12 DIAGNOSIS — C7951 Secondary malignant neoplasm of bone: Secondary | ICD-10-CM

## 2021-10-12 DIAGNOSIS — C3490 Malignant neoplasm of unspecified part of unspecified bronchus or lung: Secondary | ICD-10-CM | POA: Insufficient documentation

## 2021-10-12 MED ORDER — TECHNETIUM TC 99M MEDRONATE IV KIT: 20.0000 | PACK | Freq: Once | INTRAVENOUS | Status: DC | PRN

## 2021-10-12 MED ORDER — TECHNETIUM TC 99M MEDRONATE IV KIT
20.0000 | PACK | Freq: Once | INTRAVENOUS | Status: AC | PRN
  Administered 2021-10-12: 20.78 via INTRAVENOUS

## 2021-10-12 MED ORDER — IOHEXOL 300 MG/ML  SOLN
100.0000 mL | Freq: Once | INTRAMUSCULAR | Status: AC | PRN
  Administered 2021-10-12: 100 mL via INTRAVENOUS

## 2021-10-13 ENCOUNTER — Other Ambulatory Visit: Payer: Self-pay | Admitting: *Deleted

## 2021-10-13 ENCOUNTER — Encounter: Payer: Self-pay | Admitting: Oncology

## 2021-10-13 ENCOUNTER — Telehealth: Payer: Self-pay | Admitting: *Deleted

## 2021-10-13 DIAGNOSIS — R935 Abnormal findings on diagnostic imaging of other abdominal regions, including retroperitoneum: Secondary | ICD-10-CM

## 2021-10-13 DIAGNOSIS — C3490 Malignant neoplasm of unspecified part of unspecified bronchus or lung: Secondary | ICD-10-CM

## 2021-10-13 DIAGNOSIS — C7951 Secondary malignant neoplasm of bone: Secondary | ICD-10-CM

## 2021-10-13 DIAGNOSIS — K8689 Other specified diseases of pancreas: Secondary | ICD-10-CM

## 2021-10-13 NOTE — Telephone Encounter (Signed)
Daughter called stating she was told that the results would not be released until Dr Janese Banks looked at them and would call Nicki if abnormal. The results are released and there are abnormal readings on it and she would like a return call  IMPRESSION: 1. Significant interval decrease in the size of the left lower lobe nodule since the prior CT. No new pulmonary nodule. 2. Multiple faint and ill-defined low attenuating lesions within the pancreas, new since the prior CT, and concerning for metastatic disease. MRI or PET-CT may provide additional information. 3. Ill-defined mixed density lesion involving the anterior left acetabulum new or more conspicuous since the prior CT and suspicious for metastatic disease. Similar appearance of mixed density lesion involving the right iliac bone adjacent to the SI joint as the prior CT. 4. Aortic Atherosclerosis (ICD10-I70.0).     Electronically Signed   By: Anner Crete M.D.   On: 10/13/2021 01:28

## 2021-10-13 NOTE — Telephone Encounter (Signed)
I have spoke to Beth [facility] and made a telephone nite about it

## 2021-10-13 NOTE — Telephone Encounter (Signed)
I spoke to Sh[place] and made telephone note about it

## 2021-10-13 NOTE — Telephone Encounter (Signed)
Lexine Baton called and said that she looked at scan results and saw something in pancreas and then Musculoskeletal area left acetabulum. We are still waiting for the bone scan. And it is rec: to do pet scan to give [place] more info as if it is cancer or something else. We have put in pet scan and we are getting a new pet scanner and so everyone will have to go to [place] with pet. She understands . We will let her know when we get pet approved and scheduled

## 2021-10-19 NOTE — Progress Notes (Signed)
Pet scan was set up and daughter Beth Mcmillan knows about it. Pt coming 3/7 to see Janese Banks and the next day getting the pet

## 2021-10-21 NOTE — Progress Notes (Signed)
Daughter knows about the scan and she will see you 3/7 and then have scan 3/8

## 2021-10-25 ENCOUNTER — Other Ambulatory Visit: Payer: Self-pay | Admitting: Oncology

## 2021-10-26 ENCOUNTER — Encounter: Payer: Self-pay | Admitting: Oncology

## 2021-10-26 MED ORDER — POTASSIUM CHLORIDE CRYS ER 20 MEQ PO TBCR: 20.0000 meq | EXTENDED_RELEASE_TABLET | Freq: Every day | ORAL | 0 refills | Status: DC

## 2021-10-26 NOTE — Telephone Encounter (Signed)
?  Component Ref Range & Units 2 wk ago ?(10/06/21) 1 mo ago ?(09/15/21) 2 mo ago ?(08/25/21) 2 mo ago ?(08/04/21) 3 mo ago ?(07/14/21) 4 mo ago ?(06/23/21) 4 mo ago ?(06/02/21)  ?Potassium 3.5 - 5.1 mmol/L 3.5  3.2 Low   3.5  3.2 Low   3.5  3.6  3.5   ?  ? ?

## 2021-10-27 ENCOUNTER — Inpatient Hospital Stay: Payer: Medicare Other | Attending: Oncology

## 2021-10-27 ENCOUNTER — Inpatient Hospital Stay (HOSPITAL_BASED_OUTPATIENT_CLINIC_OR_DEPARTMENT_OTHER): Payer: Medicare Other | Admitting: Oncology

## 2021-10-27 ENCOUNTER — Other Ambulatory Visit: Payer: Self-pay

## 2021-10-27 ENCOUNTER — Inpatient Hospital Stay: Payer: Medicare Other

## 2021-10-27 ENCOUNTER — Encounter: Payer: Self-pay | Admitting: Oncology

## 2021-10-27 VITALS — BP 130/54 | HR 77

## 2021-10-27 VITALS — BP 130/65 | HR 91 | Temp 96.7°F | Resp 18 | Wt 150.6 lb

## 2021-10-27 DIAGNOSIS — C3490 Malignant neoplasm of unspecified part of unspecified bronchus or lung: Secondary | ICD-10-CM

## 2021-10-27 DIAGNOSIS — Z8719 Personal history of other diseases of the digestive system: Secondary | ICD-10-CM | POA: Diagnosis not present

## 2021-10-27 DIAGNOSIS — C7931 Secondary malignant neoplasm of brain: Secondary | ICD-10-CM | POA: Diagnosis not present

## 2021-10-27 DIAGNOSIS — C7889 Secondary malignant neoplasm of other digestive organs: Secondary | ICD-10-CM | POA: Diagnosis not present

## 2021-10-27 DIAGNOSIS — Z882 Allergy status to sulfonamides status: Secondary | ICD-10-CM | POA: Diagnosis not present

## 2021-10-27 DIAGNOSIS — Z923 Personal history of irradiation: Secondary | ICD-10-CM | POA: Diagnosis not present

## 2021-10-27 DIAGNOSIS — Z8249 Family history of ischemic heart disease and other diseases of the circulatory system: Secondary | ICD-10-CM | POA: Insufficient documentation

## 2021-10-27 DIAGNOSIS — Z87891 Personal history of nicotine dependence: Secondary | ICD-10-CM | POA: Diagnosis not present

## 2021-10-27 DIAGNOSIS — Z8049 Family history of malignant neoplasm of other genital organs: Secondary | ICD-10-CM | POA: Diagnosis not present

## 2021-10-27 DIAGNOSIS — Z801 Family history of malignant neoplasm of trachea, bronchus and lung: Secondary | ICD-10-CM | POA: Insufficient documentation

## 2021-10-27 DIAGNOSIS — M1712 Unilateral primary osteoarthritis, left knee: Secondary | ICD-10-CM | POA: Diagnosis not present

## 2021-10-27 DIAGNOSIS — E871 Hypo-osmolality and hyponatremia: Secondary | ICD-10-CM

## 2021-10-27 DIAGNOSIS — Z881 Allergy status to other antibiotic agents status: Secondary | ICD-10-CM | POA: Insufficient documentation

## 2021-10-27 DIAGNOSIS — Z885 Allergy status to narcotic agent status: Secondary | ICD-10-CM | POA: Diagnosis not present

## 2021-10-27 DIAGNOSIS — Z79899 Other long term (current) drug therapy: Secondary | ICD-10-CM | POA: Insufficient documentation

## 2021-10-27 DIAGNOSIS — Z8349 Family history of other endocrine, nutritional and metabolic diseases: Secondary | ICD-10-CM | POA: Insufficient documentation

## 2021-10-27 DIAGNOSIS — Z5111 Encounter for antineoplastic chemotherapy: Secondary | ICD-10-CM

## 2021-10-27 DIAGNOSIS — Z818 Family history of other mental and behavioral disorders: Secondary | ICD-10-CM | POA: Diagnosis not present

## 2021-10-27 DIAGNOSIS — R5383 Other fatigue: Secondary | ICD-10-CM | POA: Insufficient documentation

## 2021-10-27 DIAGNOSIS — Z880 Allergy status to penicillin: Secondary | ICD-10-CM | POA: Insufficient documentation

## 2021-10-27 DIAGNOSIS — S81801A Unspecified open wound, right lower leg, initial encounter: Secondary | ICD-10-CM | POA: Diagnosis not present

## 2021-10-27 DIAGNOSIS — C3432 Malignant neoplasm of lower lobe, left bronchus or lung: Secondary | ICD-10-CM | POA: Insufficient documentation

## 2021-10-27 DIAGNOSIS — Z5112 Encounter for antineoplastic immunotherapy: Secondary | ICD-10-CM | POA: Insufficient documentation

## 2021-10-27 DIAGNOSIS — C7951 Secondary malignant neoplasm of bone: Secondary | ICD-10-CM | POA: Insufficient documentation

## 2021-10-27 DIAGNOSIS — N390 Urinary tract infection, site not specified: Secondary | ICD-10-CM | POA: Insufficient documentation

## 2021-10-27 DIAGNOSIS — Z888 Allergy status to other drugs, medicaments and biological substances status: Secondary | ICD-10-CM | POA: Insufficient documentation

## 2021-10-27 DIAGNOSIS — Z7189 Other specified counseling: Secondary | ICD-10-CM

## 2021-10-27 DIAGNOSIS — E876 Hypokalemia: Secondary | ICD-10-CM

## 2021-10-27 LAB — COMPREHENSIVE METABOLIC PANEL
ALT: 18 U/L (ref 0–44)
AST: 18 U/L (ref 15–41)
Albumin: 3.5 g/dL (ref 3.5–5.0)
Alkaline Phosphatase: 62 U/L (ref 38–126)
Anion gap: 7 (ref 5–15)
BUN: 13 mg/dL (ref 8–23)
CO2: 25 mmol/L (ref 22–32)
Calcium: 9.2 mg/dL (ref 8.9–10.3)
Chloride: 98 mmol/L (ref 98–111)
Creatinine, Ser: 0.93 mg/dL (ref 0.44–1.00)
GFR, Estimated: >60 mL/min (ref >60)
Glucose, Bld: 130 mg/dL — ABNORMAL HIGH (ref 70–99)
Potassium: 3.4 mmol/L — ABNORMAL LOW (ref 3.5–5.1)
Sodium: 130 mmol/L — ABNORMAL LOW (ref 135–145)
Total Bilirubin: 0.5 mg/dL (ref 0.3–1.2)
Total Protein: 6.7 g/dL (ref 6.5–8.1)

## 2021-10-27 LAB — CBC WITH DIFFERENTIAL/PLATELET
Abs Immature Granulocytes: 0.02 10*3/uL (ref 0.00–0.07)
Basophils Absolute: 0 10*3/uL (ref 0.0–0.1)
Basophils Relative: 1 %
Eosinophils Absolute: 0.1 10*3/uL (ref 0.0–0.5)
Eosinophils Relative: 2 %
HCT: 30.2 % — ABNORMAL LOW (ref 36.0–46.0)
Hemoglobin: 10.3 g/dL — ABNORMAL LOW (ref 12.0–15.0)
Immature Granulocytes: 0 %
Lymphocytes Relative: 13 %
Lymphs Abs: 0.7 10*3/uL (ref 0.7–4.0)
MCH: 33.1 pg (ref 26.0–34.0)
MCHC: 34.1 g/dL (ref 30.0–36.0)
MCV: 97.1 fL (ref 80.0–100.0)
Monocytes Absolute: 0.6 10*3/uL (ref 0.1–1.0)
Monocytes Relative: 10 %
Neutro Abs: 4.4 10*3/uL (ref 1.7–7.7)
Neutrophils Relative %: 74 %
Platelets: 151 10*3/uL (ref 150–400)
RBC: 3.11 MIL/uL — ABNORMAL LOW (ref 3.87–5.11)
RDW: 15.1 % (ref 11.5–15.5)
WBC: 5.9 10*3/uL (ref 4.0–10.5)
nRBC: 0 % (ref 0.0–0.2)

## 2021-10-27 MED ORDER — HEPARIN SOD (PORK) LOCK FLUSH 100 UNIT/ML IV SOLN
INTRAVENOUS | Status: AC
  Filled 2021-10-27: qty 5

## 2021-10-27 MED ORDER — HEPARIN SOD (PORK) LOCK FLUSH 100 UNIT/ML IV SOLN
500.0000 [IU] | Freq: Once | INTRAVENOUS | Status: AC | PRN
  Administered 2021-10-27: 500 [IU]
  Filled 2021-10-27: qty 5

## 2021-10-27 MED ORDER — SODIUM CHLORIDE 0.9 % IV SOLN
Freq: Once | INTRAVENOUS | Status: AC
  Filled 2021-10-27: qty 250

## 2021-10-27 MED ORDER — SODIUM CHLORIDE 0.9 % IV SOLN
1200.0000 mg | Freq: Once | INTRAVENOUS | Status: AC
  Administered 2021-10-27: 1200 mg via INTRAVENOUS
  Filled 2021-10-27: qty 20

## 2021-10-27 NOTE — Patient Instructions (Signed)
[facility]  Discharge Instructions: °Thank you for choosing [facility] to provide your oncology and hematology care.  ° °If you have a lab appointment with the Cancer Center, please go directly to the Cancer Center and check in at the registration area. °  °Wear comfortable clothing and clothing appropriate for easy access to any Portacath or PICC line.  ° °We strive to give you quality time with your provider. You may need to reschedule your appointment if you arrive late (15 or more minutes).  Arriving late affects you and other patients whose appointments are after yours.  Also, if you miss three or more appointments without notifying the office, you may be dismissed from the clinic at the provider’s discretion.    °  °For prescription refill requests, have your pharmacy contact our office and allow 72 hours for refills to be completed.   ° °Today you received the following chemotherapy and/or immunotherapy agents     °  °To help prevent nausea and vomiting after your treatment, we encourage you to take your nausea medication as directed. ° °BELOW ARE SYMPTOMS THAT SHOULD BE REPORTED IMMEDIATELY: °*FEVER GREATER THAN 100.4 F (38 °C) OR HIGHER °*CHILLS OR SWEATING °*NAUSEA AND VOMITING THAT IS NOT CONTROLLED WITH YOUR NAUSEA MEDICATION °*UNUSUAL SHORTNESS OF BREATH °*UNUSUAL BRUISING OR BLEEDING °*URINARY PROBLEMS (pain or burning when urinating, or frequent urination) °*BOWEL PROBLEMS (unusual diarrhea, constipation, pain near the anus) °TENDERNESS IN MOUTH AND THROAT WITH OR WITHOUT PRESENCE OF ULCERS (sore throat, sores in mouth, or a toothache) °UNUSUAL RASH, SWELLING OR PAIN  °UNUSUAL VAGINAL DISCHARGE OR ITCHING  ° °Items with * indicate a potential emergency and should be followed up as soon as possible or go to the Emergency Department if any problems should occur. ° °Please show the CHEMOTHERAPY ALERT CARD or IMMUNOTHERAPY ALERT CARD at check-in to the  Emergency Department and triage nurse. ° °Should you have questions after your visit or need to cancel or reschedule your appointment, please contact [facility]  Dept: [phone]  and follow the prompts.  Office hours are 8:00 a.m. to 4:30 p.m. Monday - Friday. Please note that voicemails left after 4:00 p.m. may not be returned until the following business day.  We are closed weekends and major holidays. You have access to a nurse at all times for urgent questions. Please call the main number to the clinic Dept: [phone] and follow the prompts. ° ° °For any non-urgent questions, you may also contact your provider using MyChart. We now offer e-Visits for anyone 18 and older to request care online for non-urgent symptoms. For details visit [email or web address]. °  °Also download the MyChart app! Go to the app store, search "MyChart", open the app, select [place], and log in with your MyChart username and password. ° °Due to Covid, a mask is required upon entering the hospital/clinic. If you do not have a mask, one will be given to you upon arrival. For doctor visits, patients may have 1 support person aged 18 or older with them. For treatment visits, patients cannot have anyone with them due to current Covid guidelines and our immunocompromised population.  ° °

## 2021-10-27 NOTE — Progress Notes (Signed)
Pt states that her left leg has not cleared up yet; at times it is a shooting pain but does not last. Daughter states pt has been dealing with frequent UTI's and will like to know if pt can have a preventitive medication.  ?

## 2021-10-27 NOTE — Progress Notes (Signed)
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  She underwent CT chest with contrast in March 2022 following an abnormal x-ray which showed a 2.6 cm mass in the left lower lobe.  This was followed by a PET CT scan which showed a 3.0 x 2.0 cm mass in the left lower lobe of the lung with an SUV of 9.7.  No evidence of locoregional adenopathy.  No evidence of intra-abdominal disease with patient was noted to have a hypermetabolic focus in the right iliac bone with an SUV of 11.1 which was suspicious for metastatic disease.  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  Cells were positive for TTF-1, CD56 and chromogranin.  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  Patient completed whole brain radiation treatment and completed carbo etoposide Tecentriq 4 cycles on 03/10/2021.    Interval history-patient is tolerating present  treatment well.  She does ambulate with a cane for better balance.  No recent falls.  ECOG PS- 1 Pain scale- 0   Review of systems- Review of Systems  Constitutional:  Positive for malaise/fatigue. Negative for chills, fever and weight loss.  HENT:  Negative for congestion, ear discharge and nosebleeds.   Eyes:  Negative for blurred vision.  Respiratory:  Negative for cough, hemoptysis, sputum production, shortness of breath and wheezing.   Cardiovascular:  Negative for chest pain, palpitations, orthopnea and claudication.  Gastrointestinal:  Negative for abdominal pain, blood in stool, constipation, diarrhea, heartburn, melena, nausea and vomiting.  Genitourinary:  Negative for dysuria, flank pain, frequency, hematuria and urgency.  Musculoskeletal:  Negative for back pain, joint pain and myalgias.  Skin:  Negative for rash.  Neurological:  Negative for dizziness, tingling, focal weakness, seizures, weakness and headaches.  Endo/Heme/Allergies:  Does not bruise/bleed easily.  Psychiatric/Behavioral:  Negative for depression and suicidal ideas. The patient does not have insomnia.       Allergies  Allergen Reactions   Augmentin [Amoxicillin-Pot Clavulanate]     "messes up my blood cells"   Codeine Nausea Only   Levaquin [Levofloxacin]     Delusions    Sulfa Antibiotics Nausea And Vomiting   Vancomycin Other (See Comments)    Redman syndome- happened in the hospital at [facility]   Tape Rash    Some bandaids cause rash sometimes     Past Medical History:  Diagnosis Date   Anxiety    Breathing problem    low breathing function 53%   COPD (chronic obstructive pulmonary disease) ([place])  EMPHYSEMA   Dysrhythmia    tachycardia   Emphysema of lung (HCC)    Hemorrhoid    History of hiatal hernia    SMALL- 2022   Hypercholesteremia    Hypertension    Hypothyroidism    Larynx polyp    Lung cancer (HCC)    Palpitations    with anxiety   Platelets decreased ([place]) 2020    Seasonal allergies    Tinnitus    Vertigo    no episodes in several years   Wears dentures    partial lower   White coat syndrome with hypertension      Past Surgical History:  Procedure Laterality Date   ABDOMINAL HYSTERECTOMY     partial   COLONOSCOPY WITH PROPOFOL N/A 01/22/2016   Procedure: COLONOSCOPY WITH PROPOFOL;  Surgeon: Lucilla Lame, MD;  Location: [place];  Service: Endoscopy;  Laterality: N/A;   COLONOSCOPY WITH PROPOFOL N/A 04/12/2019   Procedure: COLONOSCOPY WITH BIOPSY;  Surgeon: Lucilla Lame, MD;  Location: [place];  Service: Endoscopy;  Laterality: N/A;  pt would like an early appt   ct guided lung bx     IR IMAGING GUIDED PORT INSERTION  12/18/2020   POLYPECTOMY  01/22/2016   Procedure: POLYPECTOMY;  Surgeon: Lucilla Lame, MD;  Location: [place];  Service: Endoscopy;;   POLYPECTOMY N/A 04/12/2019   Procedure: POLYPECTOMY;  Surgeon: Lucilla Lame, MD;  Location: [place];  Service: Endoscopy;  Laterality: N/A;   VIDEO BRONCHOSCOPY WITH ENDOBRONCHIAL NAVIGATION N/A 11/21/2020   Procedure: VIDEO BRONCHOSCOPY WITH ENDOBRONCHIAL NAVIGATION;  Surgeon: Ottie Glazier, MD;  Location: [facility];  Service: Thoracic;  Laterality: N/A;   VIDEO BRONCHOSCOPY WITH ENDOBRONCHIAL ULTRASOUND N/A 11/21/2020   Procedure: VIDEO BRONCHOSCOPY WITH ENDOBRONCHIAL ULTRASOUND;  Surgeon: Ottie Glazier, MD;  Location: [facility];  Service: Thoracic;  Laterality: N/A;    Social History   Socioeconomic History   Marital status: Married    Spouse name: Not on file   Number of children: Not on file   Years of education: Not on file   Highest education level: Not on file  Occupational History   Not on file  Tobacco Use   Smoking status: Former    Packs/day: 0.50    Years: 50.00    Pack years: 25.00    Types: Cigarettes    Quit date: 11/03/2020    Years since quitting: 0.9   Smokeless tobacco: Never  Vaping Use   Vaping Use: Some days  Substance and  Sexual Activity   Alcohol use: No   Drug use: Never   Sexual activity: Not on file  Other Topics Concern   Not on file  Social History Narrative   Not on file   Social Determinants of Health   Financial Resource Strain: Not on file  Food Insecurity: Not on file  Transportation Needs: Not on file  Physical Activity: Not on file  Stress: Not on file  Social Connections: Not on file  Intimate Partner Violence: Not on file    Family History  Problem Relation Age of Onset   Alzheimer's disease Mother    Heart attack Mother    High Cholesterol Mother    Hypertension Mother    Heart block Mother    Lung cancer Father    Uterine cancer Sister    Hypertension Sister    Heart block Sister    Anxiety disorder Sister    Breast cancer Neg Hx      Current  Outpatient Medications:    acetaminophen (TYLENOL) 500 MG tablet, Take 500 mg by mouth every 6 (six) hours as needed for mild pain or moderate pain., Disp: , Rfl:    atorvastatin (LIPITOR) 10 MG tablet, Take 1 tablet by mouth daily., Disp: , Rfl:    ketotifen (ZADITOR) 0.025 % ophthalmic solution, Place 1 drop into both eyes 2 (two) times daily as needed., Disp: , Rfl:    levothyroxine (SYNTHROID, LEVOTHROID) 50 MCG tablet, Take 50 mcg by mouth daily before breakfast., Disp: , Rfl:    loratadine (CLARITIN) 10 MG tablet, Take 10 mg by mouth daily. Reported on 01/22/2016, Disp: , Rfl:    LORazepam (ATIVAN) 0.5 MG tablet, TAKE 1 TABLET(0.5 MG) BY MOUTH EVERY 6 HOURS AS NEEDED FOR ANXIETY, Disp: 120 tablet, Rfl: 0   losartan-hydrochlorothiazide (HYZAAR) 100-12.5 MG tablet, , Disp: , Rfl:    magnesium chloride (SLOW-MAG) 64 MG TBEC SR tablet, Take 1 tablet (64 mg total) by mouth daily., Disp: 30 tablet, Rfl: 0   metoprolol succinate (TOPROL-XL) 50 MG 24 hr tablet, Take 50 mg by mouth daily., Disp: , Rfl:    nitrofurantoin (MACRODANTIN) 100 MG capsule, , Disp: , Rfl:    nitrofurantoin, macrocrystal-monohydrate, (MACROBID) 100 MG capsule,  Take 100 mg by mouth 2 (two) times daily., Disp: , Rfl:    potassium chloride SA (KLOR-CON M) 20 MEQ tablet, Take 1 tablet (20 mEq total) by mouth daily., Disp: 28 tablet, Rfl: 0   QUEtiapine (SEROQUEL) 25 MG tablet, Take 1 tablet (25 mg total) by mouth at bedtime., Disp: 30 tablet, Rfl: 2   sodium chloride 1 g tablet, , Disp: , Rfl:    TRELEGY ELLIPTA 100-62.5-25 MCG/ACT AEPB, INHALE 1 PUFF INTO THE LUNGS DAILY, Disp: 60 each, Rfl: 3   VITAMIN MIXTURE PO, Take 1 tablet by mouth in the morning, at noon, and at bedtime. Balance of nature - fruit, Disp: , Rfl:    loperamide (IMODIUM) 2 MG capsule, Take 2 mg by mouth as needed for diarrhea or loose stools. (Patient not taking: Reported on 09/15/2021), Disp: , Rfl:  No current facility-administered medications for this visit.  Facility-Administered Medications Ordered in Other Visits:    0.9 %  sodium chloride infusion, , Intravenous, Once, Earlie Server, MD   heparin lock flush 100 UNIT/ML injection, , , ,   Physical exam:  Vitals:   10/27/21 0931  BP: 130/65  Pulse: 91  Resp: 18  Temp: (!) 96.7 F (35.9 C)  SpO2: 99%  Weight: 150 lb 9.6 oz (68.3 kg)   Physical Exam Cardiovascular:     Rate and Rhythm: Normal rate and regular rhythm.     Heart sounds: Normal heart sounds.  Pulmonary:     Effort: Pulmonary effort is normal.  Musculoskeletal:     Comments: There is an area of healed ulceration noted over her right leg in the anterior aspect  Skin:    General: Skin is warm and dry.  Neurological:     Mental Status: She is alert and oriented to person, place, and time.     CMP Latest Ref Rng & Units 10/27/2021  Glucose 70 - 99 mg/dL 130(H)  BUN 8 - 23 mg/dL 13  Creatinine 0.44 - 1.00 mg/dL 0.93  Sodium 135 - 145 mmol/L 130(L)  Potassium 3.5 - 5.1 mmol/L 3.4(L)  Chloride 98 - 111 mmol/L 98  CO2 22 - 32 mmol/L 25  Calcium 8.9 - 10.3 mg/dL 9.2  Total Protein 6.5 - 8.1 g/dL  6.7  Total Bilirubin 0.3 - 1.2 mg/dL 0.5  Alkaline Phos 38 -  126 U/L 62  AST 15 - 41 U/L 18  ALT 0 - 44 U/L 18   CBC Latest Ref Rng & Units 10/27/2021  WBC 4.0 - 10.5 K/uL 5.9  Hemoglobin 12.0 - 15.0 g/dL 10.3(L)  Hematocrit 36.0 - 46.0 % 30.2(L)  Platelets 150 - 400 K/uL 151    No images are attached to the encounter.  NM Bone Scan Whole Body  Result Date: 10/13/2021 CLINICAL DATA:  Small cell lung cancer with bone metastases, staging EXAM: NUCLEAR MEDICINE WHOLE BODY BONE SCAN TECHNIQUE: Whole body anterior and posterior images were obtained approximately 3 hours after intravenous injection of radiopharmaceutical. RADIOPHARMACEUTICALS:  20.78 mCi Technetium-70m MDP IV COMPARISON:  07/06/2021 Correlation: CT chest abdomen pelvis 10/12/2021 FINDINGS: Abnormal foci of increased tracer uptake identified at the medial RIGHT iliac bone and at the LEFT acetabulum. LEFT acetabular uptake is new since the previous exam. Findings are consistent with osseous metastases. Questionable focus of increased tracer uptake at the posterior RIGHT sixth rib, without CT abnormality. No additional osseous metastatic foci identified. Uptake at the shoulders, sternoclavicular joints, wrists, LEFT knee, typically degenerative. Expected urinary tract and soft tissue distribution of tracer. IMPRESSION: Stable osseous metastasis RIGHT iliac bone. New osseous metastatic lesion at medial LEFT acetabulum, corresponding to sclerotic focus on CT. Questionable osseous metastasis at posterior RIGHT sixth rib, new. Electronically Signed   By: Lavonia Dana M.D.   On: 10/13/2021 15:45   CT CHEST ABDOMEN PELVIS W CONTRAST  Result Date: 10/13/2021 CLINICAL DATA:  Small cell lung cancer with bone metastasis. Staging. EXAM: CT CHEST, ABDOMEN, AND PELVIS WITH CONTRAST TECHNIQUE: Multidetector CT imaging of the chest, abdomen and pelvis was performed following the standard protocol during bolus administration of intravenous contrast. RADIATION DOSE REDUCTION: This exam was performed according to the  departmental dose-optimization program which includes automated exposure control, adjustment of the mA and/or kV according to patient size and/or use of iterative reconstruction technique. CONTRAST:  141mL OMNIPAQUE IOHEXOL 300 MG/ML  SOLN COMPARISON:  CT dated 07/09/2021. FINDINGS: CT CHEST FINDINGS Cardiovascular: There is no cardiomegaly or pericardial effusion. There is coronary vascular calcification. Moderate atherosclerotic calcification of the thoracic aorta. No aneurysmal dilatation. The origins of the great vessels of the aortic arch appear patent as visualized. The central pulmonary arteries are unremarkable. Mediastinum/Nodes: No hilar or mediastinal adenopathy. The esophagus and the thyroid gland are grossly unremarkable. No mediastinal fluid collection. Right-sided Port-A-Cath with tip at the cavoatrial junction. Lungs/Pleura: Significant interval decrease in the size of the left lower lobe nodule since the prior CT. This nodule now measures approximately 14 x 10 mm (112/4), previously measuring approximately 27 x 19 mm. No new nodule. There are bibasilar linear atelectasis/scarring similar to prior CT. No consolidative changes. There is no pleural effusion pneumothorax. The central airways are patent. Musculoskeletal: No acute osseous pathology. No suspicious bone lesions. CT ABDOMEN PELVIS FINDINGS No intra-abdominal free air or free fluid. Hepatobiliary: No focal liver abnormality is seen. No gallstones, gallbladder wall thickening, or biliary dilatation. Pancreas: There are several faint and ill-defined low attenuating lesions within the pancreas which are new since the prior CT. There is a 1 cm ill-defined hypodense lesion in the tail of the pancreas (72/2). A 1 cm hypodense lesion in the proximal body (74/2) and an 8 mm ill-defined hypodense lesion in the posterior body of the pancreas (76/2). There is mild dilatation of the main pancreatic duct compared  to prior CT measuring up to 3-4 mm. Mild  haziness adjacent to the tail of the pancreas. Although findings. Although these may represent inflammatory changes, findings suspicious for metastatic disease to the pancreas. Clinical correlation recommended. MRI or PET-CT may provide additional information. Spleen: Normal in size without focal abnormality. Adrenals/Urinary Tract: The adrenal glands are unremarkable. Subcentimeter left renal upper pole hypodense focus is too small to characterize but was present on the prior CT, possibly a cyst. There is no hydronephrosis on either side. There is symmetric enhancement and excretion of contrast by both kidneys. The visualized ureters and urinary bladder appear unremarkable. Stomach/Bowel: There is no bowel obstruction or active inflammation. The appendix is normal. Vascular/Lymphatic: Advanced aortoiliac atherosclerotic disease. The IVC is unremarkable no portal venous gas. There is no adenopathy. Reproductive: Hysterectomy.  No adnexal masses. Other: None Musculoskeletal: Ill-defined 18 mm faint mixed density lesion involving the anterior left acetabulum (coronal 62/5) new or more conspicuous since the prior CT and suspicious for metastatic disease. Similar appearance of mixed density lesion involving the right iliac bone adjacent to the SI joint as the prior CT. No acute osseous pathology. IMPRESSION: 1. Significant interval decrease in the size of the left lower lobe nodule since the prior CT. No new pulmonary nodule. 2. Multiple faint and ill-defined low attenuating lesions within the pancreas, new since the prior CT, and concerning for metastatic disease. MRI or PET-CT may provide additional information. 3. Ill-defined mixed density lesion involving the anterior left acetabulum new or more conspicuous since the prior CT and suspicious for metastatic disease. Similar appearance of mixed density lesion involving the right iliac bone adjacent to the SI joint as the prior CT. 4. Aortic Atherosclerosis  (ICD10-I70.0). Electronically Signed   By: Anner Crete M.D.   On: 10/13/2021 01:28     Assessment and plan- Patient is a 76 y.o. 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  She is here for on treatment assessment prior to cycle 10 of palliative Tecentriq  I have reviewed CT chest abdomen pelvis as well as bone scan images independently and discussed findings with the patient.Tecentriq for the last 6 months since  04/14/2021. Her most recent CT scan shows new hypodensities/low attenuating lesions in the pancreas which is concerning for metastatic disease.  Although inflammatory component is possible patient does not have any signs and symptoms of acute pancreatitis thereby making metastatic disease more likely.  CT scan also showed a new max density lesion in the left acetabulum concerning for metastatic disease.  Bone scan showed a questionable uptake in the posterior right sixth rib.I will assess all these findings with the PET scan that she is supposed to get tomorrow followed by discussion at tumor board.  If overall findings are more convincing for disease progression I will plan to switch her from Tecentriq to lurbinectedin in 3 weeks time.   She will proceed with Tecentriq today and I will see her back in 3 weeks for possible lurbinectedin and Zometa   Visit Diagnosis 1. Small cell lung cancer in adult [facility])   2. Goals of care, counseling/discussion   3. Encounter for antineoplastic chemotherapy      Dr. Randa Evens, MD, MPH [facility] [phone] 10/27/2021 12:56 PM

## 2021-10-28 ENCOUNTER — Ambulatory Visit (HOSPITAL_COMMUNITY)
Admission: RE | Admit: 2021-10-28 | Discharge: 2021-10-28 | Disposition: A | Discharge status: Home / Self Care | Payer: Medicare Other | Source: Ambulatory Visit | Attending: Oncology | Admitting: Oncology

## 2021-10-28 DIAGNOSIS — R935 Abnormal findings on diagnostic imaging of other abdominal regions, including retroperitoneum: Secondary | ICD-10-CM | POA: Insufficient documentation

## 2021-10-28 DIAGNOSIS — C7951 Secondary malignant neoplasm of bone: Secondary | ICD-10-CM | POA: Diagnosis present

## 2021-10-28 DIAGNOSIS — C3490 Malignant neoplasm of unspecified part of unspecified bronchus or lung: Secondary | ICD-10-CM | POA: Insufficient documentation

## 2021-10-28 DIAGNOSIS — K8689 Other specified diseases of pancreas: Secondary | ICD-10-CM | POA: Insufficient documentation

## 2021-10-28 LAB — GLUCOSE, CAPILLARY: Glucose-Capillary: 113 mg/dL — ABNORMAL HIGH (ref 70–99)

## 2021-10-28 MED ORDER — FLUDEOXYGLUCOSE F - 18 (FDG) INJECTION
7.5000 | Freq: Once | INTRAVENOUS | Status: AC
  Administered 2021-10-28: 7.4 via INTRAVENOUS

## 2021-10-29 ENCOUNTER — Encounter: Payer: Self-pay | Admitting: Oncology

## 2021-10-29 ENCOUNTER — Other Ambulatory Visit: Payer: Medicare Other

## 2021-10-29 ENCOUNTER — Other Ambulatory Visit: Payer: Self-pay | Admitting: Oncology

## 2021-10-29 DIAGNOSIS — C3490 Malignant neoplasm of unspecified part of unspecified bronchus or lung: Secondary | ICD-10-CM

## 2021-10-29 MED ORDER — PROCHLORPERAZINE MALEATE 10 MG PO TABS: 10.0000 mg | ORAL_TABLET | Freq: Four times a day (QID) | ORAL | 1 refills | Status: AC | PRN

## 2021-10-29 MED ORDER — DEXAMETHASONE 4 MG PO TABS: 8.0000 mg | ORAL_TABLET | Freq: Every day | ORAL | 1 refills | Status: DC

## 2021-10-29 MED ORDER — ONDANSETRON HCL 8 MG PO TABS: 8.0000 mg | ORAL_TABLET | Freq: Two times a day (BID) | ORAL | 1 refills | Status: AC | PRN

## 2021-10-29 NOTE — Progress Notes (Signed)
DISCONTINUE ON PATHWAY REGIMEN - Small Cell Lung ? ? ?  Cycles 1 through 4, every 21 days: ?    Atezolizumab  ?    Carboplatin  ?    Etoposide  ?  Cycles 5 and beyond, every 21 days: ?    Atezolizumab  ? ?**Always confirm dose/schedule in your pharmacy ordering system** ? ?REASON: Disease Progression ?PRIOR TREATMENT: [id number]: Atezolizumab 1,200 mg D1 + Carboplatin AUC=5 D1 + Etoposide 100 mg/m2 D1-3 q21 Days x 4 Cycles, Followed by Atezolizumab 1,200 mg q21 Days Until Progression or Unacceptable Toxicity ?TREATMENT RESPONSE: Progressive Disease (PD) ? ?START OFF PATHWAY REGIMEN - Small Cell Lung ? ? ?[id number]:Lurbinectedin 3.2 mg/m2 IV D1 q21 Days: ?  A cycle is every 21 days: ?    Lurbinectedin  ? ?**Always confirm dose/schedule in your pharmacy ordering system** ? ?Patient Characteristics: ?Relapsed or Progressive Disease, Second Line, Relapse ? 6 Months ?Therapeutic Status: Relapsed or Progressive Disease ?Line of Therapy: Second Line ?Time to Relapse: Relapse ? 6 Months ?Intent of Therapy: ?Non-Curative / Palliative Intent, Discussed with Patient ?

## 2021-10-29 NOTE — Progress Notes (Signed)
Tumor Board Documentation ? ?Beth Mcmillan was presented by Dr Janese Banks at our Tumor Board on 10/29/2021, which included representatives from medical oncology, radiation oncology, surgical, radiology, pathology, navigation, internal medicine, palliative care, research, genetics, pulmonology. ? ?Beth Mcmillan currently presents as a current patient, for [place] with history of the following treatments: palliative chemotherapy, neoadjuvant chemoradiation. ? ?Additionally, we reviewed previous medical and familial history, history of present illness, and recent lab results along with all available histopathologic and imaging studies. The tumor board considered available treatment options and made the following recommendations: ?  ?There is progression of disease in Bones and possible in Pancreas ? ?The following procedures/referrals were also placed: No orders of the defined types were placed in this encounter. ? ? ?Clinical Trial Status: not discussed  ? ?Staging used: Pathologic Stage ?AJCC Staging: ?  ?  ?  ?Group: Extensive stage Small Cell Lung Cancer Brain Mets, Bone Mets ? ? ?National site-specific guidelines   were discussed with respect to the case. ? ?Tumor board is a meeting of clinicians from various specialty areas who evaluate and discuss patients for whom a multidisciplinary approach is being considered. Final determinations in the plan of care are those of the provider(s). The responsibility for follow up of recommendations given during tumor board is that of the provider.  ? ?Today?s extended care, comprehensive team conference, Beth Mcmillan was not present for the discussion and was not examined.  ? ?Multidisciplinary Tumor Board is a multidisciplinary case peer review process.  Decisions discussed in the Multidisciplinary Tumor Board reflect the opinions of the specialists present at the conference without having examined the patient.  Ultimately, treatment and diagnostic decisions rest with the primary provider(s) and  the patient. ? ?

## 2021-11-02 ENCOUNTER — Other Ambulatory Visit: Payer: Self-pay | Admitting: Oncology

## 2021-11-04 ENCOUNTER — Encounter: Payer: Self-pay | Admitting: Oncology

## 2021-11-10 NOTE — Progress Notes (Signed)
Pharmacist Chemotherapy Monitoring - Initial Assessment   ? ?Anticipated start date: 11/17/21  ? ?The following has been reviewed per standard work regarding the patient's treatment regimen: ?The patient's diagnosis, treatment plan and drug doses, and organ/hematologic function ?Lab orders and baseline tests specific to treatment regimen  ?The treatment plan start date, drug sequencing, and pre-medications ?Prior authorization status  ?Patient's documented medication list, including drug-drug interaction screen and prescriptions for anti-emetics and supportive care specific to the treatment regimen ?The drug concentrations, fluid compatibility, administration routes, and timing of the medications to be used ?The patient's access for treatment and lifetime cumulative dose history, if applicable  ?The patient's medication allergies and previous infusion related reactions, if applicable  ? ?Changes made to treatment plan:  ?treatment plan date ? ?Follow up needed:  ?N/A ? ? ?Adelina Mings, [facility], ?11/10/2021  11:19 AM  ?

## 2021-11-17 ENCOUNTER — Other Ambulatory Visit: Payer: Self-pay

## 2021-11-17 ENCOUNTER — Inpatient Hospital Stay (HOSPITAL_BASED_OUTPATIENT_CLINIC_OR_DEPARTMENT_OTHER): Payer: Medicare Other | Admitting: Oncology

## 2021-11-17 ENCOUNTER — Encounter: Payer: Self-pay | Admitting: Oncology

## 2021-11-17 ENCOUNTER — Inpatient Hospital Stay: Payer: Medicare Other

## 2021-11-17 VITALS — BP 149/76 | HR 85 | Temp 98.7°F | Resp 20 | Wt 152.8 lb

## 2021-11-17 DIAGNOSIS — C3490 Malignant neoplasm of unspecified part of unspecified bronchus or lung: Secondary | ICD-10-CM | POA: Diagnosis not present

## 2021-11-17 DIAGNOSIS — Z5181 Encounter for therapeutic drug level monitoring: Secondary | ICD-10-CM | POA: Diagnosis not present

## 2021-11-17 DIAGNOSIS — C7951 Secondary malignant neoplasm of bone: Secondary | ICD-10-CM

## 2021-11-17 DIAGNOSIS — Z5111 Encounter for antineoplastic chemotherapy: Secondary | ICD-10-CM

## 2021-11-17 DIAGNOSIS — Z5112 Encounter for antineoplastic immunotherapy: Secondary | ICD-10-CM | POA: Diagnosis not present

## 2021-11-17 DIAGNOSIS — Z7983 Long term (current) use of bisphosphonates: Secondary | ICD-10-CM

## 2021-11-17 LAB — CBC WITH DIFFERENTIAL/PLATELET
Abs Immature Granulocytes: 0.06 10*3/uL (ref 0.00–0.07)
Basophils Absolute: 0 10*3/uL (ref 0.0–0.1)
Basophils Relative: 1 %
Eosinophils Absolute: 0.1 10*3/uL (ref 0.0–0.5)
Eosinophils Relative: 2 %
HCT: 29.6 % — ABNORMAL LOW (ref 36.0–46.0)
Hemoglobin: 10.3 g/dL — ABNORMAL LOW (ref 12.0–15.0)
Immature Granulocytes: 1 %
Lymphocytes Relative: 11 %
Lymphs Abs: 0.6 10*3/uL — ABNORMAL LOW (ref 0.7–4.0)
MCH: 33.7 pg (ref 26.0–34.0)
MCHC: 34.8 g/dL (ref 30.0–36.0)
MCV: 96.7 fL (ref 80.0–100.0)
Monocytes Absolute: 0.4 10*3/uL (ref 0.1–1.0)
Monocytes Relative: 8 %
Neutro Abs: 4.1 10*3/uL (ref 1.7–7.7)
Neutrophils Relative %: 77 %
Platelets: 151 10*3/uL (ref 150–400)
RBC: 3.06 MIL/uL — ABNORMAL LOW (ref 3.87–5.11)
RDW: 14.4 % (ref 11.5–15.5)
WBC: 5.2 10*3/uL (ref 4.0–10.5)
nRBC: 0 % (ref 0.0–0.2)

## 2021-11-17 LAB — COMPREHENSIVE METABOLIC PANEL
ALT: 17 U/L (ref 0–44)
AST: 22 U/L (ref 15–41)
Albumin: 3.5 g/dL (ref 3.5–5.0)
Alkaline Phosphatase: 53 U/L (ref 38–126)
Anion gap: 8 (ref 5–15)
BUN: 14 mg/dL (ref 8–23)
CO2: 22 mmol/L (ref 22–32)
Calcium: 8.8 mg/dL — ABNORMAL LOW (ref 8.9–10.3)
Chloride: 97 mmol/L — ABNORMAL LOW (ref 98–111)
Creatinine, Ser: 0.98 mg/dL (ref 0.44–1.00)
GFR, Estimated: >60 mL/min (ref >60)
Glucose, Bld: 151 mg/dL — ABNORMAL HIGH (ref 70–99)
Potassium: 3.4 mmol/L — ABNORMAL LOW (ref 3.5–5.1)
Sodium: 127 mmol/L — ABNORMAL LOW (ref 135–145)
Total Bilirubin: 0.6 mg/dL (ref 0.3–1.2)
Total Protein: 6.5 g/dL (ref 6.5–8.1)

## 2021-11-17 MED ORDER — HEPARIN SOD (PORK) LOCK FLUSH 100 UNIT/ML IV SOLN
500.0000 [IU] | Freq: Once | INTRAVENOUS | Status: AC | PRN
  Filled 2021-11-17: qty 5

## 2021-11-17 MED ORDER — SODIUM CHLORIDE 0.9 % IV SOLN
3.2000 mg/m2 | Freq: Once | INTRAVENOUS | Status: AC
  Administered 2021-11-17: 5.75 mg via INTRAVENOUS
  Filled 2021-11-17: qty 11.5

## 2021-11-17 MED ORDER — ZOLEDRONIC ACID 4 MG/5ML IV CONC
3.5000 mg | INTRAVENOUS | Status: DC
  Administered 2021-11-17: 3.5 mg via INTRAVENOUS
  Filled 2021-11-17: qty 4.38

## 2021-11-17 MED ORDER — SODIUM CHLORIDE 0.9 % IV SOLN
10.0000 mg | Freq: Once | INTRAVENOUS | Status: AC
  Administered 2021-11-17: 10 mg via INTRAVENOUS
  Filled 2021-11-17: qty 10

## 2021-11-17 MED ORDER — SODIUM CHLORIDE 0.9 % IV SOLN
Freq: Once | INTRAVENOUS | Status: AC
  Filled 2021-11-17: qty 250

## 2021-11-17 MED ORDER — PALONOSETRON HCL INJECTION 0.25 MG/5ML
0.2500 mg | Freq: Once | INTRAVENOUS | Status: AC
  Administered 2021-11-17: 0.25 mg via INTRAVENOUS
  Filled 2021-11-17: qty 5

## 2021-11-17 MED ORDER — HEPARIN SOD (PORK) LOCK FLUSH 100 UNIT/ML IV SOLN
INTRAVENOUS | Status: AC
  Administered 2021-11-17: 500 [IU]
  Filled 2021-11-17: qty 5

## 2021-11-17 NOTE — Progress Notes (Signed)
Nutrition Follow-up: ? ?Patient with small cell lung cancer with mets to bone.  Noted per MD note patient with recent progression on CT scan.  ?Treatment lurbinectedin q 21 days. ? ?Met with patient during infusion.  Reports that appetite is good.  Continues to drink boost soothe about 2 a day and gatorade.  Typically eats peanut butter nabs for breakfast and good lunch and supper (meat and side dishes).   ? ?Denies any nutrition impact symptoms ? ? ? ?Medications: reviewed ? ?Labs: reviewed ? ?Anthropometrics:  ? ?Weight 152 lb 12.8 oz ? ?149 lb on 1/24 ?150 lb on 1/3 ?151 lb on 11/22 ?149 lb on 10/11 ? ? ?NUTRITION DIAGNOSIS: Inadequate oral intake improved ? ? ? ?INTERVENTION:  ?Continue boost soothe shakes for added calories and protein ?Continue high calorie, high protein foods for weight maintenance ?  ? ?MONITORING, EVALUATION, GOAL: weight trends, intake ? ? ?NEXT VISIT: ~ 6 weeks with treatment ? ? B. , RD, LDN ?Registered Dietitian ?[phone] ? ? ?

## 2021-11-17 NOTE — Patient Instructions (Addendum)
[facility] CANCER CTR AT [place]  Discharge Instructions: ?Thank you for choosing [facility] to provide your oncology and hematology care.  ?If you have a lab appointment with the [facility], please go directly to the [facility] and check in at the registration area. ? ?Wear comfortable clothing and clothing appropriate for easy access to any Portacath or PICC line.  ? ?We strive to give you quality time with your provider. You may need to reschedule your appointment if you arrive late (15 or more minutes).  Arriving late affects you and other patients whose appointments are after yours.  Also, if you miss three or more appointments without notifying the office, you may be dismissed from the clinic at the provider?s discretion.    ?  ?For prescription refill requests, have your pharmacy contact our office and allow 72 hours for refills to be completed.   ? ?Today you received the following chemotherapy and/or immunotherapy agents Lurbinectedin    ?  ?To help prevent nausea and vomiting after your treatment, we encourage you to take your nausea medication as directed. ? ?BELOW ARE SYMPTOMS THAT SHOULD BE REPORTED IMMEDIATELY: ?*FEVER GREATER THAN 100.4 F (38 ?C) OR HIGHER ?*CHILLS OR SWEATING ?*NAUSEA AND VOMITING THAT IS NOT CONTROLLED WITH YOUR NAUSEA MEDICATION ?*UNUSUAL SHORTNESS OF BREATH ?*UNUSUAL BRUISING OR BLEEDING ?*URINARY PROBLEMS (pain or burning when urinating, or frequent urination) ?*BOWEL PROBLEMS (unusual diarrhea, constipation, pain near the anus) ?TENDERNESS IN MOUTH AND THROAT WITH OR WITHOUT PRESENCE OF ULCERS (sore throat, sores in mouth, or a toothache) ?UNUSUAL RASH, SWELLING OR PAIN  ?UNUSUAL VAGINAL DISCHARGE OR ITCHING  ? ?Items with * indicate a potential emergency and should be followed up as soon as possible or go to the Emergency Department if any problems should occur. ? ?Please show the CHEMOTHERAPY ALERT CARD or IMMUNOTHERAPY ALERT CARD at check-in  to the Emergency Department and triage nurse. ? ?Should you have questions after your visit or need to cancel or reschedule your appointment, please contact [facility] CANCER [place] AT [place]  [phone] and follow the prompts.  Office hours are 8:00 a.m. to 4:30 p.m. Monday - Friday. Please note that voicemails left after 4:00 p.m. may not be returned until the following business day.  We are closed weekends and major holidays. You have access to a nurse at all times for urgent questions. Please call the main number to the clinic [phone] and follow the prompts. ? ?For any non-urgent questions, you may also contact your provider using MyChart. We now offer e-Visits for anyone 74 and older to request care online for non-urgent symptoms. For details visit [email or web address]. ?  ?Also download the MyChart app! Go to the app store, search "MyChart", open the app, select [facility], and log in with your MyChart username and password. ? ?Due to Covid, a mask is required upon entering the hospital/clinic. If you do not have a mask, one will be given to you upon arrival. For doctor visits, patients may have 1 support person aged 74 or older with them. For treatment visits, patients cannot have anyone with them due to current Covid guidelines and our immunocompromised population.  ? ? ?Lurbinectedin Injection ?What is this medication? ?LURBINECTEDIN (LOOR bin EK te din) treats lung cancer. It works by slowing down the growth of cancer cells. ?This medicine may be used for other purposes; ask your health care provider or pharmacist if you have questions. ?COMMON BRAND NAME(S): ZEPZELCA ?What should I tell my care  team before I take this medication? ?They need to know if you have any of these conditions: ?Liver disease ?Low blood counts--white cells, platelets, red blood cells ?An unusual or allergic reaction to lurbinectedin, other medicines, foods, dyes or preservatives ?Pregnant or trying to get  pregnant ?Beast-feeding ?How should I use this medication? ?This medication is injected into a vein. It is given in a hospital or clinic setting. ?Talk to your care team about the use of this medicine in children. Special care may be needed. ?Overdosage: If you think you have taken too much of this medicine contact a poison control center or emergency room at once. ?NOTE: This medicine is only for you. Do not share this medicine with others. ?What if I miss a dose? ?Keep appointments for follow-up doses. It is important not to miss your dose. Call your care team if you are unable to keep an appointment. ?What may interact with this medication? ?Certain antibiotics like erythromycin or clarithromycin ?Certain antivirals for HIV or hepatitis ?Certain medicines for fungal infections like ketoconazole, itraconazole, posaconazole ?Certain medicines for seizures like carbamazepine, phenobarbital, phenytoin ?Grapefruit or grapefruit juice ?[place] ?This list may not describe all possible interactions. Give your health care provider a list of all the medicines, herbs, non-prescription drugs, or dietary supplements you use. Also tell them if you smoke, drink alcohol, or use illegal drugs. Some items may interact with your medicine. ?What should I watch for while using this medication? ?This medication may make you feel generally unwell. This is not uncommon as chemotherapy can affect healthy cells as well as cancer cells. Report any side effects. Continue your course of treatment even though you feel ill unless your care team tells you to stop. ?This medication may increase your risk of getting an infection. Call your care team for advice if you get a fever, chills, sore throat, or other symptoms of a cold or flu. Do not treat yourself. Try to avoid being around people who are sick. ?Avoid taking medications that contain aspirin, acetaminophen, ibuprofen, naproxen, or ketoprofen unless instructed by your care team.  These medications may hide a fever. ?Be careful brushing or flossing your teeth or using a toothpick because you may get an infection or bleed more easily. If you have any dental work done, tell your dentist you are receiving this medication. ?Do not become pregnant while taking this medication or for 6 months after stopping it. Women should inform their care team if they wish to become pregnant or think they might be pregnant. Men should not father a child while taking this medication and for 4 months after stopping it. There is potential for serious side effects to an unborn child. Talk to your care team for more information. Do not breast-feed a child while taking this medication or for 2 weeks after stopping it. ?What side effects may I notice from receiving this medication? ?Side effects that you should report to your care team as soon as possible: ?Allergic reactions--skin rash, itching, hives, swelling of the face, lips, tongue, or throat ?Infection--fever, chills, cough, or sore throat ?Kidney injury--decrease in the amount of urine, swelling of the ankles, hands, or feet ?Leakage of medicine out of your vein during the infusion ?Liver injury--right upper belly pain, loss of appetite, nausea, light-colored stool, dark yellow or brown urine, yellowing skin or eyes, unusual weakness or fatigue ?Low red blood cell count--unusual weakness or fatigue, dizziness, headache, trouble breathing ?Muscle injury--unusual weakness or fatigue, muscle pain, dark yellow or  brown urine, decrease in amount of urine ?Painful swelling, warmth, or redness of the skin, blisters, or sores ?Unusual bruising or bleeding ?Vomiting ?Side effects that usually do not require medical attention (report these to your care team if they continue or are bothersome): ?Constipation ?Cough ?Diarrhea ?High blood sugar (hyperglycemia)--increased thirst or amount of urine, unusual weakness or fatigue, blurry vision ?Loss of appetite ?Low magnesium  level--muscle pain or cramps, unusual weakness or fatigue, fast or irregular heartbeat, tremors ?Low sodium level--muscle weakness, fatigue, dizziness, headache, confusion ?Nausea ?Trouble breathing ?This li

## 2021-11-17 NOTE — Progress Notes (Signed)
? ? ? ?Hematology/Oncology Consult note ?[facility]  ?[phone] [phone] ? ?Patient Care Team: ?Idelle Crouch, MD as PCP - General (Internal Medicine) ?Telford Nab, RN as Sales executive ?Sindy Guadeloupe, MD as Attending Physician (Hematology)  ? ?Name of the patient: Beth Mcmillan  ?[id number]  ?[birth year]  ? ?Date of visit: 11/17/21 ? 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 ?Chief complaint/ Reason for visit-on treatment assessment prior to cycle 1 of lurbinectedin ? ?Heme/Onc history: Patient is a 76 year old female with extensive smoking history and currently smokes half a pack per day.  She underwent CT chest with contrast in March 2022 following an abnormal x-ray which showed a 2.6 cm mass in the left lower lobe.  This was followed by a PET CT scan which showed a 3.0 x 2.0 cm mass in the left lower lobe of the lung with an SUV of 9.7.  No evidence of locoregional adenopathy.  No evidence of intra-abdominal disease with patient was noted to have a hypermetabolic focus in the right iliac bone with an SUV of 11.1 which was suspicious for metastatic disease.  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  Cells were positive for TTF-1, CD56 and chromogranin.  Right iliac bone biopsy was done and results were consistent with small cell lung cancer ?  ?Patient received 1 dose of carbo etoposide Tecentriq chemotherapy and following that she had an MRI brain which showedAt least 3 distinct 0.5 to 1 cm lesions in the right parieto-occipital region as well as cerebellum with mild associated edema.  Patient completed whole brain radiation treatment and completed carbo etoposide Tecentriq 4 cycles on 03/10/2021.  Disease progression in March 2022 with new bone metastases and pancreatic metastases ? ?Interval history-patient is here with  her daughter today.  She is feeling overall at her baseline.  No recent falls.  Right leg wound is healing slowly. ? ?ECOG PS- 1 ?Pain scale- 0 ? ? ?Review of systems- Review of Systems  ?Constitutional:  Positive for malaise/fatigue. Negative for chills, fever and weight loss.  ?HENT:  Negative for congestion, ear discharge and nosebleeds.   ?Eyes:  Negative for blurred vision.  ?Respiratory:  Negative for cough, hemoptysis, sputum production, shortness of breath and wheezing.   ?Cardiovascular:  Negative for chest pain, palpitations, orthopnea and claudication.  ?Gastrointestinal:  Negative for abdominal pain, blood in stool, constipation, diarrhea, heartburn, melena, nausea and vomiting.  ?Genitourinary:  Negative for dysuria, flank pain, frequency, hematuria and urgency.  ?Musculoskeletal:  Negative for back pain, joint pain and myalgias.  ?Skin:  Negative for rash.  ?Neurological:  Negative for dizziness, tingling, focal weakness, seizures, weakness and headaches.  ?Endo/Heme/Allergies:  Does not bruise/bleed easily.  ?Psychiatric/Behavioral:  Negative for depression and suicidal ideas. The patient does not have insomnia.    ? ? ?Allergies  ?Allergen Reactions  ? Augmentin [Amoxicillin-Pot Clavulanate]   ?  "messes up my blood cells"  ? Codeine Nausea Only  ? Levaquin [Levofloxacin]   ?  Delusions   ? Sulfa Antibiotics Nausea And Vomiting  ? Vancomycin Other (See Comments)  ?  [place]man syndome- happened in [facility] hospital at [facility]  ? Tape Rash  ?  Some bandaids cause rash sometimes  ? ? ? ?Past Medical History:  ?Diagnosis Date  ? Anxiety   ? Breathing problem   ? low breathing function 53%  ? COPD (chronic  obstructive pulmonary disease) ([place])   ? EMPHYSEMA  ? Dysrhythmia   ? tachycardia  ? Emphysema of lung ([place])   ? Hemorrhoid   ? History of hiatal hernia   ? SMALL- 2022  ? Hypercholesteremia   ? Hypertension   ? Hypothyroidism   ? Larynx polyp   ? Lung cancer ([place])   ? Palpitations   ? with anxiety  ? Platelets  decreased ([place]) 2020  ? Seasonal allergies   ? Tinnitus   ? Vertigo   ? no episodes in several years  ? Wears dentures   ? partial lower  ? White coat syndrome with hypertension   ? ? ? ?Past Surgical History:  ?Procedure Laterality Date  ? ABDOMINAL HYSTERECTOMY    ? partial  ? COLONOSCOPY WITH PROPOFOL N/A 01/22/2016  ? Procedure: COLONOSCOPY WITH PROPOFOL;  Surgeon: Lucilla Lame, MD;  Location: [facility];  Service: Endoscopy;  Laterality: N/A;  ? COLONOSCOPY WITH PROPOFOL N/A 04/12/2019  ? Procedure: COLONOSCOPY WITH BIOPSY;  Surgeon: Lucilla Lame, MD;  Location: [facility];  Service: Endoscopy;  Laterality: N/A;  pt would like an early appt  ? ct guided lung bx    ? IR IMAGING GUIDED PORT INSERTION  12/18/2020  ? POLYPECTOMY  01/22/2016  ? Procedure: POLYPECTOMY;  Surgeon: Lucilla Lame, MD;  Location: [facility];  Service: Endoscopy;;  ? POLYPECTOMY N/A 04/12/2019  ? Procedure: POLYPECTOMY;  Surgeon: Lucilla Lame, MD;  Location: [facility];  Service: Endoscopy;  Laterality: N/A;  ? VIDEO BRONCHOSCOPY WITH ENDOBRONCHIAL NAVIGATION N/A 11/21/2020  ? Procedure: VIDEO BRONCHOSCOPY WITH ENDOBRONCHIAL NAVIGATION;  Surgeon: Ottie Glazier, MD;  Location: [facility];  Service: Thoracic;  Laterality: N/A;  ? VIDEO BRONCHOSCOPY WITH ENDOBRONCHIAL ULTRASOUND N/A 11/21/2020  ? Procedure: VIDEO BRONCHOSCOPY WITH ENDOBRONCHIAL ULTRASOUND;  Surgeon: Ottie Glazier, MD;  Location: [facility];  Service: Thoracic;  Laterality: N/A;  ? ? ?Social History  ? ?Socioeconomic History  ? Marital status: Married  ?  Spouse name: Not on file  ? Number of children: Not on file  ? Years of education: Not on file  ? Highest education level: Not on file  ?Occupational History  ? Not on file  ?Tobacco Use  ? Smoking status: Former  ?  Packs/day: 0.50  ?  Years: 50.00  ?  Pack years: 25.00  ?  Types: Cigarettes  ?  Quit date: 11/03/2020  ?  Years since quitting: 1.0  ? Smokeless tobacco: Never  ?Vaping Use  ? Vaping Use:  Some days  ?Substance and Sexual Activity  ? Alcohol use: No  ? Drug use: Never  ? Sexual activity: Not on file  ?Other Topics Concern  ? Not on file  ?Social History Narrative  ? Not on file  ? ?Social Determinants of Health  ? ?Financial Resource Strain: Not on file  ?Food Insecurity: Not on file  ?Transportation Needs: Not on file  ?Physical Activity: Not on file  ?Stress: Not on file  ?Social Connections: Not on file  ?Intimate Partner Violence: Not on file  ? ? ?Family History  ?Problem Relation Age of Onset  ? Alzheimer's disease Mother   ? Heart attack Mother   ? High Cholesterol Mother   ? Hypertension Mother   ? Heart block Mother   ? Lung cancer Father   ? Uterine cancer Sister   ? Hypertension Sister   ? Heart block Sister   ? Anxiety disorder Sister   ? Breast cancer Neg  Hx   ? ? ? ?Current Outpatient Medications:  ?  acetaminophen (TYLENOL) 500 MG tablet, Take 500 mg by mouth every 6 (six) hours as needed for mild pain or moderate pain., Disp: , Rfl:  ?  atorvastatin (LIPITOR) 10 MG tablet, Take 1 tablet by mouth daily., Disp: , Rfl:  ?  dexamethasone (DECADRON) 4 MG tablet, Take 2 tablets (8 mg total) by mouth daily. Start the day after chemotherapy for 2 days. Take with food., Disp: 30 tablet, Rfl: 1 ?  ketotifen (ZADITOR) 0.025 % ophthalmic solution, Place 1 drop into both eyes 2 (two) times daily as needed., Disp: , Rfl:  ?  levothyroxine (SYNTHROID, LEVOTHROID) 50 MCG tablet, Take 50 mcg by mouth daily before breakfast., Disp: , Rfl:  ?  loperamide (IMODIUM) 2 MG capsule, Take 2 mg by mouth as needed for diarrhea or loose stools., Disp: , Rfl:  ?  loratadine (CLARITIN) 10 MG tablet, Take 10 mg by mouth daily. Reported on 01/22/2016, Disp: , Rfl:  ?  LORazepam (ATIVAN) 0.5 MG tablet, TAKE 1 TABLET(0.5 MG) BY MOUTH EVERY 6 HOURS AS NEEDED FOR ANXIETY, Disp: 120 tablet, Rfl: 0 ?  losartan-hydrochlorothiazide (HYZAAR) 100-12.5 MG tablet, , Disp: , Rfl:  ?  magnesium chloride (SLOW-MAG) 64 MG TBEC SR  tablet, Take 1 tablet (64 mg total) by mouth daily., Disp: 30 tablet, Rfl: 0 ?  metoprolol succinate (TOPROL-XL) 50 MG 24 hr tablet, Take 50 mg by mouth daily., Disp: , Rfl:  ?  nitrofurantoin, macrocrystal-m

## 2021-11-21 DIAGNOSIS — K219 Gastro-esophageal reflux disease without esophagitis: Secondary | ICD-10-CM | POA: Insufficient documentation

## 2021-11-22 ENCOUNTER — Encounter: Payer: Self-pay | Admitting: Oncology

## 2021-11-23 ENCOUNTER — Other Ambulatory Visit: Payer: Self-pay | Admitting: Oncology

## 2021-11-24 ENCOUNTER — Telehealth: Payer: Self-pay | Admitting: *Deleted

## 2021-11-24 ENCOUNTER — Encounter: Payer: Self-pay | Admitting: *Deleted

## 2021-11-24 ENCOUNTER — Encounter: Payer: Self-pay | Admitting: Oncology

## 2021-11-24 NOTE — Telephone Encounter (Signed)
Component Ref Range & Units 7 d ago ?(11/17/21) 4 wk ago ?(10/27/21) 1 mo ago ?(10/06/21) 2 mo ago ?(09/15/21) 3 mo ago ?(08/25/21) 3 mo ago ?(08/04/21) 4 mo ago ?(07/14/21)  ?Potassium 3.5 - 5.1 mmol/L 3.4 Low   3.4 Low   3.5  3.2 Low   3.5  3.2 Low   3.5   ? ?

## 2021-11-24 NOTE — Telephone Encounter (Signed)
Daughter Beth Mcmillan called reporting that patient had last chemotherapy last Tuesday and patient became constipated Wednesday she started taking Miralax Saturday and she still has only passed 2 small piece of stool. She is asking if something can be called in for her constipation. Please advise ?

## 2021-11-25 ENCOUNTER — Encounter: Payer: Self-pay | Admitting: Oncology

## 2021-11-25 ENCOUNTER — Other Ambulatory Visit: Payer: Self-pay | Admitting: Oncology

## 2021-11-25 NOTE — Telephone Encounter (Signed)
Call returned to [facility], left message on her voice mail regarding adding Senna 1-2 tabs once or twice a day and if htat does not work to call us back and we would decide on further measures at that time ?

## 2021-11-26 ENCOUNTER — Other Ambulatory Visit: Payer: Self-pay | Admitting: *Deleted

## 2021-11-29 ENCOUNTER — Other Ambulatory Visit: Payer: Self-pay | Admitting: Oncology

## 2021-11-30 ENCOUNTER — Encounter: Payer: Self-pay | Admitting: Oncology

## 2021-12-04 ENCOUNTER — Encounter: Payer: Self-pay | Admitting: Oncology

## 2021-12-06 ENCOUNTER — Other Ambulatory Visit: Payer: Self-pay | Admitting: Nurse Practitioner

## 2021-12-07 ENCOUNTER — Encounter: Payer: Self-pay | Admitting: Oncology

## 2021-12-07 ENCOUNTER — Encounter: Payer: Self-pay | Admitting: *Deleted

## 2021-12-08 ENCOUNTER — Telehealth: Payer: Self-pay | Admitting: *Deleted

## 2021-12-08 ENCOUNTER — Inpatient Hospital Stay: Payer: Medicare Other | Attending: Oncology

## 2021-12-08 ENCOUNTER — Inpatient Hospital Stay: Payer: Medicare Other

## 2021-12-08 ENCOUNTER — Telehealth: Payer: Self-pay

## 2021-12-08 ENCOUNTER — Encounter: Payer: Self-pay | Admitting: Oncology

## 2021-12-08 ENCOUNTER — Inpatient Hospital Stay (HOSPITAL_BASED_OUTPATIENT_CLINIC_OR_DEPARTMENT_OTHER): Payer: Medicare Other | Admitting: Oncology

## 2021-12-08 VITALS — BP 132/63 | HR 77 | Temp 97.9°F | Resp 16 | Ht 67.0 in | Wt 151.8 lb

## 2021-12-08 DIAGNOSIS — Z8049 Family history of malignant neoplasm of other genital organs: Secondary | ICD-10-CM | POA: Insufficient documentation

## 2021-12-08 DIAGNOSIS — C3490 Malignant neoplasm of unspecified part of unspecified bronchus or lung: Secondary | ICD-10-CM

## 2021-12-08 DIAGNOSIS — Z79899 Other long term (current) drug therapy: Secondary | ICD-10-CM | POA: Insufficient documentation

## 2021-12-08 DIAGNOSIS — Z5111 Encounter for antineoplastic chemotherapy: Secondary | ICD-10-CM | POA: Insufficient documentation

## 2021-12-08 DIAGNOSIS — T451X5A Adverse effect of antineoplastic and immunosuppressive drugs, initial encounter: Secondary | ICD-10-CM

## 2021-12-08 DIAGNOSIS — Z5189 Encounter for other specified aftercare: Secondary | ICD-10-CM | POA: Insufficient documentation

## 2021-12-08 DIAGNOSIS — Z801 Family history of malignant neoplasm of trachea, bronchus and lung: Secondary | ICD-10-CM | POA: Diagnosis not present

## 2021-12-08 DIAGNOSIS — R5383 Other fatigue: Secondary | ICD-10-CM | POA: Insufficient documentation

## 2021-12-08 DIAGNOSIS — C3432 Malignant neoplasm of lower lobe, left bronchus or lung: Secondary | ICD-10-CM | POA: Diagnosis present

## 2021-12-08 DIAGNOSIS — Z818 Family history of other mental and behavioral disorders: Secondary | ICD-10-CM | POA: Insufficient documentation

## 2021-12-08 DIAGNOSIS — E039 Hypothyroidism, unspecified: Secondary | ICD-10-CM | POA: Insufficient documentation

## 2021-12-08 DIAGNOSIS — C7889 Secondary malignant neoplasm of other digestive organs: Secondary | ICD-10-CM | POA: Insufficient documentation

## 2021-12-08 DIAGNOSIS — E871 Hypo-osmolality and hyponatremia: Secondary | ICD-10-CM | POA: Insufficient documentation

## 2021-12-08 DIAGNOSIS — R12 Heartburn: Secondary | ICD-10-CM | POA: Diagnosis not present

## 2021-12-08 DIAGNOSIS — Z87891 Personal history of nicotine dependence: Secondary | ICD-10-CM | POA: Diagnosis not present

## 2021-12-08 DIAGNOSIS — Z8349 Family history of other endocrine, nutritional and metabolic diseases: Secondary | ICD-10-CM | POA: Diagnosis not present

## 2021-12-08 DIAGNOSIS — C7951 Secondary malignant neoplasm of bone: Secondary | ICD-10-CM | POA: Diagnosis not present

## 2021-12-08 DIAGNOSIS — Z8249 Family history of ischemic heart disease and other diseases of the circulatory system: Secondary | ICD-10-CM | POA: Diagnosis not present

## 2021-12-08 DIAGNOSIS — I10 Essential (primary) hypertension: Secondary | ICD-10-CM | POA: Diagnosis not present

## 2021-12-08 DIAGNOSIS — D6481 Anemia due to antineoplastic chemotherapy: Secondary | ICD-10-CM | POA: Insufficient documentation

## 2021-12-08 LAB — COMPREHENSIVE METABOLIC PANEL
ALT: 18 U/L (ref 0–44)
AST: 21 U/L (ref 15–41)
Albumin: 3.3 g/dL — ABNORMAL LOW (ref 3.5–5.0)
Alkaline Phosphatase: 58 U/L (ref 38–126)
Anion gap: 10 (ref 5–15)
BUN: 13 mg/dL (ref 8–23)
CO2: 23 mmol/L (ref 22–32)
Calcium: 9.1 mg/dL (ref 8.9–10.3)
Chloride: 95 mmol/L — ABNORMAL LOW (ref 98–111)
Creatinine, Ser: 1.16 mg/dL — ABNORMAL HIGH (ref 0.44–1.00)
GFR, Estimated: 49 mL/min — ABNORMAL LOW (ref >60)
Glucose, Bld: 116 mg/dL — ABNORMAL HIGH (ref 70–99)
Potassium: 3 mmol/L — ABNORMAL LOW (ref 3.5–5.1)
Sodium: 128 mmol/L — ABNORMAL LOW (ref 135–145)
Total Bilirubin: 0.4 mg/dL (ref 0.3–1.2)
Total Protein: 6.4 g/dL — ABNORMAL LOW (ref 6.5–8.1)

## 2021-12-08 LAB — CBC WITH DIFFERENTIAL/PLATELET
Abs Immature Granulocytes: 0.17 10*3/uL — ABNORMAL HIGH (ref 0.00–0.07)
Basophils Absolute: 0 10*3/uL (ref 0.0–0.1)
Basophils Relative: 1 %
Eosinophils Absolute: 0 10*3/uL (ref 0.0–0.5)
Eosinophils Relative: 0 %
HCT: 24.3 % — ABNORMAL LOW (ref 36.0–46.0)
Hemoglobin: 8.3 g/dL — ABNORMAL LOW (ref 12.0–15.0)
Immature Granulocytes: 5 %
Lymphocytes Relative: 11 %
Lymphs Abs: 0.4 10*3/uL — ABNORMAL LOW (ref 0.7–4.0)
MCH: 32.3 pg (ref 26.0–34.0)
MCHC: 34.2 g/dL (ref 30.0–36.0)
MCV: 94.6 fL (ref 80.0–100.0)
Monocytes Absolute: 0.5 10*3/uL (ref 0.1–1.0)
Monocytes Relative: 14 %
Neutro Abs: 2.4 10*3/uL (ref 1.7–7.7)
Neutrophils Relative %: 69 %
Platelets: 135 10*3/uL — ABNORMAL LOW (ref 150–400)
RBC: 2.57 MIL/uL — ABNORMAL LOW (ref 3.87–5.11)
RDW: 13.9 % (ref 11.5–15.5)
WBC: 3.4 10*3/uL — ABNORMAL LOW (ref 4.0–10.5)
nRBC: 0 % (ref 0.0–0.2)

## 2021-12-08 MED ORDER — HEPARIN SOD (PORK) LOCK FLUSH 100 UNIT/ML IV SOLN
INTRAVENOUS | Status: AC
  Administered 2021-12-08: 500 [IU]
  Filled 2021-12-08: qty 5

## 2021-12-08 MED ORDER — POTASSIUM CHLORIDE IN NACL 20-0.9 MEQ/L-% IV SOLN
Freq: Once | INTRAVENOUS | Status: AC
  Filled 2021-12-08: qty 1000

## 2021-12-08 MED ORDER — PALONOSETRON HCL INJECTION 0.25 MG/5ML
0.2500 mg | Freq: Once | INTRAVENOUS | Status: AC
  Administered 2021-12-08: 0.25 mg via INTRAVENOUS
  Filled 2021-12-08: qty 5

## 2021-12-08 MED ORDER — SODIUM CHLORIDE 0.9 % IV SOLN
10.0000 mg | Freq: Once | INTRAVENOUS | Status: AC
  Administered 2021-12-08: 10 mg via INTRAVENOUS
  Filled 2021-12-08: qty 10

## 2021-12-08 MED ORDER — SODIUM CHLORIDE 0.9 % IV SOLN
Freq: Once | INTRAVENOUS | Status: AC
  Filled 2021-12-08: qty 250

## 2021-12-08 MED ORDER — SODIUM CHLORIDE 0.9 % IV SOLN
2.6000 mg/m2 | Freq: Once | INTRAVENOUS | Status: AC
  Administered 2021-12-08: 4.7 mg via INTRAVENOUS
  Filled 2021-12-08: qty 9.4

## 2021-12-08 NOTE — Telephone Encounter (Signed)
Dr. Janese Banks was in the room speaking to pt and got her daughter on the phone so she can hear everything about her visit today. I came in and asked Beth Mcmillan to call me or I can call her and she was at work and said that I can call her after 4 to get the meds she is on. I called at 5:06 and she went over all her meds and then I entered them in. ?

## 2021-12-08 NOTE — Progress Notes (Signed)
? ? ? ?Hematology/Oncology Consult note ?[place]  ?[phone] [phone] ? ?Patient Care Team: ?Idelle Crouch, MD as PCP - General (Internal Medicine) ?Telford Nab, RN as Sales executive ?Sindy Guadeloupe, MD as Attending Physician (Hematology)  ? ?Name of the patient: Beth Mcmillan  ?[id number]  ?04-Feb-1946  ? ?Date of visit: 12/08/21 ? 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 ?Chief complaint/ Reason for visit-on treatment assessment prior to cycle 2 of lurbinectedin ? ?Heme/Onc history: Patient is a 76 year old female with extensive smoking history and currently smokes half a pack per day.  She underwent CT chest with contrast in March 2022 following an abnormal x-ray which showed a 2.6 cm mass in the left lower lobe.  This was followed by a PET CT scan which showed a 3.0 x 2.0 cm mass in the left lower lobe of the lung with an SUV of 9.7.  No evidence of locoregional adenopathy.  No evidence of intra-abdominal disease with patient was noted to have a hypermetabolic focus in the right iliac bone with an SUV of 11.1 which was suspicious for metastatic disease.  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  Cells were positive for TTF-1, CD56 and chromogranin.  Right iliac bone biopsy was done and results were consistent with small cell lung cancer ?  ?Patient received 1 dose of carbo etoposide Tecentriq chemotherapy and following that she had an MRI brain which showedAt least 3 distinct 0.5 to 1 cm lesions in the right parieto-occipital region as well as cerebellum with mild associated edema.  Patient completed whole brain radiation treatment and completed carbo etoposide Tecentriq 4 cycles on 03/10/2021.  Disease progression in March 2022 with new bone metastases and pancreatic metastases ? ?Interval history-patient and daughter  report that she feels weaker after the start of chemotherapy.  There are times when she is confused.  She has had a fall at home.  There are episodes when she is confused and does not remember all the medications that she is taking.  She is taking as needed Tums for heartburn which is not helping. ? ?ECOG PS- 2 ?Pain scale- 2 ?Opioid associated constipation- no ? ?Review of systems- Review of Systems  ?Constitutional:  Positive for malaise/fatigue. Negative for chills, fever and weight loss.  ?HENT:  Negative for congestion, ear discharge and nosebleeds.   ?Eyes:  Negative for blurred vision.  ?Respiratory:  Negative for cough, hemoptysis, sputum production, shortness of breath and wheezing.   ?Cardiovascular:  Negative for chest pain, palpitations, orthopnea and claudication.  ?Gastrointestinal:  Positive for heartburn. Negative for abdominal pain, blood in stool, constipation, diarrhea, melena, nausea and vomiting.  ?Genitourinary:  Negative for dysuria, flank pain, frequency, hematuria and urgency.  ?Musculoskeletal:  Negative for back pain, joint pain and myalgias.  ?Skin:  Negative for rash.  ?Neurological:  Negative for dizziness, tingling, focal weakness, seizures, weakness and headaches.  ?Endo/Heme/Allergies:  Does not bruise/bleed easily.  ?Psychiatric/Behavioral:  Negative for depression and suicidal ideas. The patient does not have insomnia.    ? ? ? ?Allergies  ?Allergen Reactions  ? Augmentin [Amoxicillin-Pot Clavulanate]   ?  "messes up my blood cells"  ? Codeine Nausea Only  ? Levaquin [Levofloxacin]   ?  Delusions   ? Sulfa Antibiotics Nausea And Vomiting  ? Vancomycin Other (See Comments)  ?  Redman syndome- happened in the hospital at [facility]  ?  Tape Rash  ?  Some bandaids cause rash sometimes  ? ? ? ?Past Medical History:  ?Diagnosis Date  ? Anxiety   ? Breathing problem   ? low breathing function 53%  ? COPD (chronic obstructive pulmonary disease) ([place])   ? EMPHYSEMA  ? Dysrhythmia   ? tachycardia   ? Emphysema of lung ([place])   ? Hemorrhoid   ? History of hiatal hernia   ? SMALL- 2022  ? Hypercholesteremia   ? Hypertension   ? Hypothyroidism   ? Larynx polyp   ? Lung cancer ([place])   ? Palpitations   ? with anxiety  ? Platelets decreased ([place]) 2020  ? Seasonal allergies   ? Tinnitus   ? Vertigo   ? no episodes in several years  ? Wears dentures   ? partial lower  ? White coat syndrome with hypertension   ? ? ? ?Past Surgical History:  ?Procedure Laterality Date  ? ABDOMINAL HYSTERECTOMY    ? partial  ? COLONOSCOPY WITH PROPOFOL N/A 01/22/2016  ? Procedure: COLONOSCOPY WITH PROPOFOL;  Surgeon: Lucilla Lame, MD;  Location: [place];  Service: Endoscopy;  Laterality: N/A;  ? COLONOSCOPY WITH PROPOFOL N/A 04/12/2019  ? Procedure: COLONOSCOPY WITH BIOPSY;  Surgeon: Lucilla Lame, MD;  Location: [place];  Service: Endoscopy;  Laterality: N/A;  pt would like an early appt  ? ct guided lung bx    ? IR IMAGING GUIDED PORT INSERTION  12/18/2020  ? POLYPECTOMY  01/22/2016  ? Procedure: POLYPECTOMY;  Surgeon: Lucilla Lame, MD;  Location: [place];  Service: Endoscopy;;  ? POLYPECTOMY N/A 04/12/2019  ? Procedure: POLYPECTOMY;  Surgeon: Lucilla Lame, MD;  Location: [place];  Service: Endoscopy;  Laterality: N/A;  ? VIDEO BRONCHOSCOPY WITH ENDOBRONCHIAL NAVIGATION N/A 11/21/2020  ? Procedure: VIDEO BRONCHOSCOPY WITH ENDOBRONCHIAL NAVIGATION;  Surgeon: Ottie Glazier, MD;  Location: [facility];  Service: Thoracic;  Laterality: N/A;  ? VIDEO BRONCHOSCOPY WITH ENDOBRONCHIAL ULTRASOUND N/A 11/21/2020  ? Procedure: VIDEO BRONCHOSCOPY WITH ENDOBRONCHIAL ULTRASOUND;  Surgeon: Ottie Glazier, MD;  Location: [facility];  Service: Thoracic;  Laterality: N/A;  ? ? ?Social History  ? ?Socioeconomic History  ? Marital status: Married  ?  Spouse name: Not on file  ? Number of children: Not on file  ? Years of education: Not on file  ? Highest education level: Not on file  ?Occupational History  ? Not on file   ?Tobacco Use  ? Smoking status: Former  ?  Packs/day: 0.50  ?  Years: 50.00  ?  Pack years: 25.00  ?  Types: Cigarettes  ?  Quit date: 11/03/2020  ?  Years since quitting: 1.0  ? Smokeless tobacco: Never  ?Vaping Use  ? Vaping Use: Some days  ?Substance and Sexual Activity  ? Alcohol use: No  ? Drug use: Never  ? Sexual activity: Not Currently  ?Other Topics Concern  ? Not on file  ?Social History Narrative  ? Not on file  ? ?Social Determinants of Health  ? ?Financial Resource Strain: Not on file  ?Food Insecurity: Not on file  ?Transportation Needs: Not on file  ?Physical Activity: Not on file  ?Stress: Not on file  ?Social Connections: Not on file  ?Intimate Partner Violence: Not on file  ? ? ?Family History  ?Problem Relation Age of Onset  ? Alzheimer's disease Mother   ? Heart attack Mother   ? High Cholesterol Mother   ? Hypertension Mother   ?  Heart block Mother   ? Lung cancer Father   ? Uterine cancer Sister   ? Hypertension Sister   ? Heart block Sister   ? Anxiety disorder Sister   ? Breast cancer Neg Hx   ? ? ? ?Current Outpatient Medications:  ?  acetaminophen (TYLENOL) 500 MG tablet, Take 500 mg by mouth every 6 (six) hours as needed for mild pain or moderate pain., Disp: , Rfl:  ?  atorvastatin (LIPITOR) 10 MG tablet, Take 1 tablet by mouth daily., Disp: , Rfl:  ?  dexamethasone (DECADRON) 4 MG tablet, Take 2 tablets (8 mg total) by mouth daily. Start the day after chemotherapy for 2 days. Take with food., Disp: 30 tablet, Rfl: 1 ?  ketotifen (ZADITOR) 0.025 % ophthalmic solution, Place 1 drop into both eyes 2 (two) times daily as needed., Disp: , Rfl:  ?  levothyroxine (SYNTHROID, LEVOTHROID) 50 MCG tablet, Take 50 mcg by mouth daily before breakfast., Disp: , Rfl:  ?  loperamide (IMODIUM) 2 MG capsule, Take 2 mg by mouth as needed for diarrhea or loose stools., Disp: , Rfl:  ?  loratadine (CLARITIN) 10 MG tablet, Take 10 mg by mouth daily. Reported on 01/22/2016, Disp: , Rfl:  ?  LORazepam (ATIVAN)  0.5 MG tablet, TAKE 1 TABLET(0.5 MG) BY MOUTH EVERY 6 HOURS AS NEEDED FOR ANXIETY, Disp: 120 tablet, Rfl: 0 ?  losartan-hydrochlorothiazide (HYZAAR) 100-12.5 MG tablet, , Disp: , Rfl:  ?  magnesium chlorid

## 2021-12-08 NOTE — Progress Notes (Signed)
Pt states er only problem is that her legs are so weak that she can't walk but few steps . She has slid down the wall 1 day . She does not drive any longer. Daughter Lexine Baton will call me at 4 pm to go over the medications. She was unable to come with pt. ?

## 2021-12-08 NOTE — Addendum Note (Signed)
Addended by: Luella Cook on: 12/08/2021 07:16 PM ? ? Modules accepted: Orders ? ?

## 2021-12-08 NOTE — Telephone Encounter (Signed)
Spoke with patient's daughter Lexine Baton and scheduled a Mychart Palliative Consult for 12/17/21 @ 2:30 PM.  ? ?Consent obtained; updated Netsmart, Team List and Epic.  ? ?

## 2021-12-08 NOTE — Patient Instructions (Signed)
[facility] AT [place]  Discharge Instructions: ?Thank you for choosing [facility] to provide your oncology and hematology care.  ?If you have a lab appointment with the [facility], please go directly to the [facility] and check in at the registration area. ? ?Wear comfortable clothing and clothing appropriate for easy access to any Portacath or PICC line.  ? ?We strive to give you quality time with your provider. You may need to reschedule your appointment if you arrive late (15 or more minutes).  Arriving late affects you and other patients whose appointments are after yours.  Also, if you miss three or more appointments without notifying the office, you may be dismissed from the clinic at the provider?s discretion.    ?  ?For prescription refill requests, have your pharmacy contact our office and allow 72 hours for refills to be completed.   ? ?Today you received the following chemotherapy and/or immunotherapy agents Lurbinectedin    ?  ?To help prevent nausea and vomiting after your treatment, we encourage you to take your nausea medication as directed. ? ?BELOW ARE SYMPTOMS THAT SHOULD BE REPORTED IMMEDIATELY: ?*FEVER GREATER THAN 100.4 F (38 ?C) OR HIGHER ?*CHILLS OR SWEATING ?*NAUSEA AND VOMITING THAT IS NOT CONTROLLED WITH YOUR NAUSEA MEDICATION ?*UNUSUAL SHORTNESS OF BREATH ?*UNUSUAL BRUISING OR BLEEDING ?*URINARY PROBLEMS (pain or burning when urinating, or frequent urination) ?*BOWEL PROBLEMS (unusual diarrhea, constipation, pain near the anus) ?TENDERNESS IN MOUTH AND THROAT WITH OR WITHOUT PRESENCE OF ULCERS (sore throat, sores in mouth, or a toothache) ?UNUSUAL RASH, SWELLING OR PAIN  ?UNUSUAL VAGINAL DISCHARGE OR ITCHING  ? ?Items with * indicate a potential emergency and should be followed up as soon as possible or go to the Emergency Department if any problems should occur. ? ?Please show the CHEMOTHERAPY ALERT CARD or IMMUNOTHERAPY ALERT CARD at check-in  to the Emergency Department and triage nurse. ? ?Should you have questions after your visit or need to cancel or reschedule your appointment, please contact [facility] [place] AT [place]  [phone] and follow the prompts.  Office hours are 8:00 a.m. to 4:30 p.m. Monday - Friday. Please note that voicemails left after 4:00 p.m. may not be returned until the following business day.  We are closed weekends and major holidays. You have access to a nurse at all times for urgent questions. Please call the main number to the clinic [phone] and follow the prompts. ? ?For any non-urgent questions, you may also contact your provider using MyChart. We now offer e-Visits for anyone 53 and older to request care online for non-urgent symptoms. For details visit [email or web address]. ?  ?Also download the MyChart app! Go to the app store, search "MyChart", open the app, select [facility], and log in with your MyChart username and password. ? ?Due to Covid, a mask is required upon entering the hospital/clinic. If you do not have a mask, one will be given to you upon arrival. For doctor visits, patients may have 1 support person aged 23 or older with them. For treatment visits, patients cannot have anyone with them due to current Covid guidelines and our immunocompromised population.  ? ? ?Hypokalemia ?Hypokalemia means that the amount of potassium in the blood is lower than normal. Potassium is a mineral (electrolyte) that helps regulate the amount of fluid in the body. It also stimulates muscle tightening (contraction) and helps nerves work properly. ?Normally, most of the body's potassium is inside cells, and only a very  small amount is in the blood. Because the amount in the blood is so small, minor changes to potassium levels in the blood can be life-threatening. ?What are the causes? ?This condition may be caused by: ?Antibiotic medicine. ?Diarrhea or vomiting. Taking too much of a medicine that  helps you have a bowel movement (laxative) can cause diarrhea and lead to hypokalemia. ?Chronic kidney disease (CKD). ?Medicines that help the body get rid of excess fluid (diuretics). ?Eating disorders, such as anorexia or bulimia. ?Low magnesium levels in the body. ?Sweating a lot. ?What are the signs or symptoms? ?Symptoms of this condition include: ?Weakness. ?Constipation. ?Fatigue. ?Muscle cramps. ?Mental confusion. ?Skipped heartbeats or irregular heartbeat (palpitations). ?Tingling or numbness. ?How is this diagnosed? ?This condition is diagnosed with a blood test. ?How is this treated? ?This condition may be treated by: ?Taking potassium supplements. ?Adjusting the medicines that you take. ?Eating more foods that contain a lot of potassium. ?If your potassium level is very low, you may need to get potassium through an IV and be monitored in the hospital. ?Follow these instructions at home: ?Eating and drinking ? ?Eat a healthy diet. A healthy diet includes fresh fruits and vegetables, whole grains, healthy fats, and lean proteins. ?If told, eat more foods that contain a lot of potassium. These include: ?Nuts, such as peanuts and pistachios. ?Seeds, such as sunflower seeds and pumpkin seeds. ?Peas, lentils, and lima beans. ?Whole grain and bran cereals and breads. ?Fresh fruits and vegetables, such as apricots, avocado, bananas, cantaloupe, kiwi, oranges, tomatoes, asparagus, and potatoes. ?Juices, such as orange, tomato, and prune. ?Lean meats, including fish. ?Milk and milk products, such as yogurt. ?General instructions ?Take over-the-counter and prescription medicines only as told by your health care provider. This includes vitamins, natural food products, and supplements. ?Keep all follow-up visits. This is important. ?Contact a health care provider if: ?You have weakness that gets worse. ?You feel your heart pounding or racing. ?You vomit. ?You have diarrhea. ?You have diabetes and you have trouble  keeping your blood sugar in your target range. ?Get help right away if: ?You have chest pain. ?You have shortness of breath. ?You have vomiting or diarrhea that lasts for more than 2 days. ?You faint. ?These symptoms may be an emergency. Get help right away. Call 911. ?Do not wait to see if the symptoms will go away. ?Do not drive yourself to the hospital. ?Summary ?Hypokalemia means that the amount of potassium in the blood is lower than normal. ?This condition is diagnosed with a blood test. ?Hypokalemia may be treated by taking potassium supplements, adjusting the medicines that you take, or eating more foods that are high in potassium. ?If your potassium level is very low, you may need to get potassium through an IV and be monitored in the hospital. ?This information is not intended to replace advice given to you by your health care provider. Make sure you discuss any questions you have with your health care provider. ?Document Revised: 04/23/2021 Document Reviewed: 04/23/2021 ?Elsevier Patient Education ? [place]. ? ?

## 2021-12-10 ENCOUNTER — Inpatient Hospital Stay: Payer: Medicare Other

## 2021-12-10 DIAGNOSIS — C3490 Malignant neoplasm of unspecified part of unspecified bronchus or lung: Secondary | ICD-10-CM

## 2021-12-10 DIAGNOSIS — Z5111 Encounter for antineoplastic chemotherapy: Secondary | ICD-10-CM | POA: Diagnosis not present

## 2021-12-10 MED ORDER — PEGFILGRASTIM-CBQV 6 MG/0.6ML ~~LOC~~ SOSY
6.0000 mg | PREFILLED_SYRINGE | Freq: Once | SUBCUTANEOUS | Status: AC
  Administered 2021-12-10: 6 mg via SUBCUTANEOUS
  Filled 2021-12-10: qty 0.6

## 2021-12-14 ENCOUNTER — Encounter: Payer: Self-pay | Admitting: Oncology

## 2021-12-15 ENCOUNTER — Other Ambulatory Visit: Payer: Self-pay

## 2021-12-15 MED ORDER — NYSTATIN 100000 UNIT/ML MT SUSP: 5.0000 mL | Freq: Four times a day (QID) | OROMUCOSAL | 1 refills | Status: AC

## 2021-12-15 NOTE — Telephone Encounter (Signed)
Pt states she is running out of nystatin. ?

## 2021-12-17 ENCOUNTER — Inpatient Hospital Stay: Payer: Medicare Other

## 2021-12-17 ENCOUNTER — Inpatient Hospital Stay (HOSPITAL_BASED_OUTPATIENT_CLINIC_OR_DEPARTMENT_OTHER): Payer: Medicare Other | Admitting: Hospice and Palliative Medicine

## 2021-12-17 ENCOUNTER — Telehealth: Payer: Medicare Other | Admitting: Nurse Practitioner

## 2021-12-17 ENCOUNTER — Encounter: Payer: Self-pay | Admitting: Hospice and Palliative Medicine

## 2021-12-17 ENCOUNTER — Other Ambulatory Visit: Payer: Self-pay | Admitting: *Deleted

## 2021-12-17 VITALS — BP 137/66 | HR 85 | Temp 97.7°F | Resp 18 | Wt 155.8 lb

## 2021-12-17 DIAGNOSIS — C3490 Malignant neoplasm of unspecified part of unspecified bronchus or lung: Secondary | ICD-10-CM

## 2021-12-17 DIAGNOSIS — D649 Anemia, unspecified: Secondary | ICD-10-CM | POA: Diagnosis not present

## 2021-12-17 DIAGNOSIS — E871 Hypo-osmolality and hyponatremia: Secondary | ICD-10-CM | POA: Diagnosis not present

## 2021-12-17 DIAGNOSIS — R531 Weakness: Secondary | ICD-10-CM

## 2021-12-17 DIAGNOSIS — C7951 Secondary malignant neoplasm of bone: Secondary | ICD-10-CM

## 2021-12-17 DIAGNOSIS — Z5111 Encounter for antineoplastic chemotherapy: Secondary | ICD-10-CM | POA: Diagnosis not present

## 2021-12-17 LAB — COMPREHENSIVE METABOLIC PANEL
ALT: 19 U/L (ref 0–44)
AST: 17 U/L (ref 15–41)
Albumin: 3 g/dL — ABNORMAL LOW (ref 3.5–5.0)
Alkaline Phosphatase: 60 U/L (ref 38–126)
Anion gap: 7 (ref 5–15)
BUN: 14 mg/dL (ref 8–23)
CO2: 21 mmol/L — ABNORMAL LOW (ref 22–32)
Calcium: 8.7 mg/dL — ABNORMAL LOW (ref 8.9–10.3)
Chloride: 102 mmol/L (ref 98–111)
Creatinine, Ser: 1 mg/dL (ref 0.44–1.00)
GFR, Estimated: 59 mL/min — ABNORMAL LOW (ref >60)
Glucose, Bld: 124 mg/dL — ABNORMAL HIGH (ref 70–99)
Potassium: 3.9 mmol/L (ref 3.5–5.1)
Sodium: 130 mmol/L — ABNORMAL LOW (ref 135–145)
Total Bilirubin: 0.4 mg/dL (ref 0.3–1.2)
Total Protein: 5.8 g/dL — ABNORMAL LOW (ref 6.5–8.1)

## 2021-12-17 LAB — CBC WITH DIFFERENTIAL/PLATELET
Abs Immature Granulocytes: 0.1 10*3/uL — ABNORMAL HIGH (ref 0.00–0.07)
Band Neutrophils: 3 %
Basophils Absolute: 0 10*3/uL (ref 0.0–0.1)
Basophils Relative: 0 %
Eosinophils Absolute: 0 10*3/uL (ref 0.0–0.5)
Eosinophils Relative: 0 %
HCT: 22.3 % — ABNORMAL LOW (ref 36.0–46.0)
Hemoglobin: 7.4 g/dL — ABNORMAL LOW (ref 12.0–15.0)
Lymphocytes Relative: 10 %
Lymphs Abs: 0.6 10*3/uL — ABNORMAL LOW (ref 0.7–4.0)
MCH: 32.7 pg (ref 26.0–34.0)
MCHC: 33.2 g/dL (ref 30.0–36.0)
MCV: 98.7 fL (ref 80.0–100.0)
Metamyelocytes Relative: 2 %
Monocytes Absolute: 0.4 10*3/uL (ref 0.1–1.0)
Monocytes Relative: 7 %
Neutro Abs: 4.5 10*3/uL (ref 1.7–7.7)
Neutrophils Relative %: 78 %
Platelets: 41 10*3/uL — ABNORMAL LOW (ref 150–400)
RBC: 2.26 MIL/uL — ABNORMAL LOW (ref 3.87–5.11)
RDW: 14.5 % (ref 11.5–15.5)
Smear Review: NORMAL
WBC: 5.5 10*3/uL (ref 4.0–10.5)
nRBC: 0.5 % — ABNORMAL HIGH (ref 0.0–0.2)

## 2021-12-17 LAB — PREPARE RBC (CROSSMATCH)

## 2021-12-17 MED ORDER — HEPARIN SOD (PORK) LOCK FLUSH 100 UNIT/ML IV SOLN
500.0000 [IU] | Freq: Once | INTRAVENOUS | Status: AC
  Administered 2021-12-17: 500 [IU] via INTRAVENOUS
  Filled 2021-12-17: qty 5

## 2021-12-17 MED ORDER — SODIUM CHLORIDE 0.9 % IV SOLN
Freq: Once | INTRAVENOUS | Status: AC
  Filled 2021-12-17: qty 250

## 2021-12-17 MED ORDER — SODIUM CHLORIDE 0.9% FLUSH
10.0000 mL | Freq: Once | INTRAVENOUS | Status: AC
  Administered 2021-12-17: 10 mL via INTRAVENOUS
  Filled 2021-12-17: qty 10

## 2021-12-17 NOTE — Addendum Note (Signed)
Addended by: Altha Harm R on: 12/17/2021 11:50 AM ? ? Modules accepted: Orders ? ?

## 2021-12-17 NOTE — Progress Notes (Signed)
Patient here in [facility] c/o feet swelling, numbness and SOB on exertion  ?

## 2021-12-17 NOTE — Patient Instructions (Signed)

## 2021-12-17 NOTE — Progress Notes (Signed)
? ?[facility] ?[place]  ?[phone] [phone] ? ?Patient Care Team: ?Idelle Crouch, MD as PCP - General (Internal Medicine) ?Telford Nab, RN as Sales executive ?Sindy Guadeloupe, MD as Attending Physician (Hematology)  ? ?Name of the patient: Beth Mcmillan  ?[id number]  ?05-18-1946  ? ?Date of visit: 12/17/21 ? ?Reason for Consult: ? ?Ms. 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  Patient had disease progression in March 2022 with new bone and pancreatic metastases. ? ?Patient is now on lurbinectedin chemotherapy.  Patient saw Dr. Janese Banks on 12/08/2021 at which time she was found to be more anemic after cycle 1 lurbinectedin.  Decision was made to dose reduce for cycle 2, which patient received on 4/18.  Patient was noted to have some confusion and is pending MRI of the brain. ? ?Patient returns to clinic today for supportive care and possible IV fluids or blood transfusion.  Patient denies significant changes.  She continues to endorse weakness but states that she is functionally independent.  She denies recent falls.  She reports exertional dyspnea but states that it is unchanged in characteristic or severity.  No fever or chills.  No GI or GU symptoms.  No bleeding. Patient denies other symptomatic complaints. ? ?PAST MEDICAL HISTORY: ?Past Medical History:  ?Diagnosis Date  ? Anxiety   ? Breathing problem   ? low breathing function 53%  ? COPD (chronic obstructive pulmonary disease) ([place])   ? EMPHYSEMA  ? Dysrhythmia   ? tachycardia  ? Emphysema of lung ([place])   ? Hemorrhoid   ? History of hiatal hernia   ? SMALL- 2022  ? Hypercholesteremia   ? Hypertension   ? Hypothyroidism   ? Larynx polyp   ? Lung cancer ([place])   ? Palpitations   ? with anxiety  ? Platelets  decreased ([place]) 2020  ? Seasonal allergies   ? Tinnitus   ? Vertigo   ? no episodes in several years  ? Wears dentures   ? partial lower  ? White coat syndrome with hypertension   ? ? ?PAST SURGICAL HISTORY:  ?Past Surgical History:  ?Procedure Laterality Date  ? ABDOMINAL HYSTERECTOMY    ? partial  ? COLONOSCOPY WITH PROPOFOL N/A 01/22/2016  ? Procedure: COLONOSCOPY WITH PROPOFOL;  Surgeon: Lucilla Lame, MD;  Location: [place];  Service: Endoscopy;  Laterality: N/A;  ? COLONOSCOPY WITH PROPOFOL N/A 04/12/2019  ? Procedure: COLONOSCOPY WITH BIOPSY;  Surgeon: Lucilla Lame, MD;  Location: [place];  Service: Endoscopy;  Laterality: N/A;  pt would like an early appt  ? ct guided lung bx    ? IR IMAGING GUIDED PORT INSERTION  12/18/2020  ? POLYPECTOMY  01/22/2016  ? Procedure: POLYPECTOMY;  Surgeon: Lucilla Lame, MD;  Location: [place];  Service: Endoscopy;;  ? POLYPECTOMY N/A 04/12/2019  ? Procedure: POLYPECTOMY;  Surgeon: Lucilla Lame, MD;  Location: [place];  Service: Endoscopy;  Laterality: N/A;  ? VIDEO BRONCHOSCOPY WITH ENDOBRONCHIAL NAVIGATION N/A 11/21/2020  ? Procedure: VIDEO BRONCHOSCOPY WITH ENDOBRONCHIAL NAVIGATION;  Surgeon: Ottie Glazier, MD;  Location: [facility];  Service: Thoracic;  Laterality: N/A;  ? VIDEO BRONCHOSCOPY WITH ENDOBRONCHIAL ULTRASOUND N/A 11/21/2020  ? Procedure: VIDEO BRONCHOSCOPY WITH ENDOBRONCHIAL ULTRASOUND;  Surgeon: Ottie Glazier, MD;  Location: [facility];  Service: Thoracic;  Laterality: N/A;  ? ? ?HEMATOLOGY/ONCOLOGY HISTORY:  ?Oncology History  ?Small cell lung cancer in adult [facility])  ?12/13/2020 Initial Diagnosis  ? Small cell lung cancer in adult [facility]) ?  ?12/22/2020 - 10/27/2021 Chemotherapy  ? Patient is on Treatment Plan : LUNG SCLC Carboplatin + Etoposide + Atezolizumab Induction q21d / Atezolizumab Maintenance q21d  ?   ?12/22/2020 Cancer Staging  ? Staging form: Lung, AJCC 8th Edition ?- Clinical stage from 12/22/2020: Stage IV (cT2, cN0, cM1) -  Signed by Sindy Guadeloupe, MD on 12/22/2020 ?  ?11/17/2021 -  Chemotherapy  ? Patient is on Treatment Plan : LUNG SMALL CELL Lurbinectedin q21d  ?   ? ? ?ALLERGIES:  is allergic to augmentin [amoxicillin-pot clavulanate], codeine, levaquin [levofloxacin], sulfa antibiotics, vancomycin, and tape. ? ?MEDICATIONS:  ?Current Outpatient Medications  ?Medication Sig Dispense Refill  ? acetaminophen (TYLENOL) 500 MG tablet Take 500 mg by mouth every 6 (six) hours as needed for mild pain or moderate pain.    ? atorvastatin (LIPITOR) 10 MG tablet Take 1 tablet by mouth daily.    ? dexamethasone (DECADRON) 4 MG tablet Take 2 tablets (8 mg total) by mouth daily. Start the day after chemotherapy for 2 days. Take with food. 30 tablet 1  ? ketotifen (ZADITOR) 0.025 % ophthalmic solution Place 1 drop into both eyes 2 (two) times daily as needed.    ? levothyroxine (SYNTHROID, LEVOTHROID) 50 MCG tablet Take 50 mcg by mouth daily before breakfast.    ? loperamide (IMODIUM) 2 MG capsule Take 2 mg by mouth as needed for diarrhea or loose stools.    ? loratadine (CLARITIN) 10 MG tablet Take 10 mg by mouth daily. Reported on 01/22/2016    ? LORazepam (ATIVAN) 0.5 MG tablet TAKE 1 TABLET(0.5 MG) BY MOUTH EVERY 6 HOURS AS NEEDED FOR ANXIETY 120 tablet 0  ? magnesium chloride (SLOW-MAG) 64 MG TBEC SR tablet Take 1 tablet (64 mg total) by mouth daily. 30 tablet 0  ? metoprolol succinate (TOPROL-XL) 50 MG 24 hr tablet Take 50 mg by mouth daily.    ? nystatin (MYCOSTATIN) 100000 UNIT/ML suspension Take 5 mLs (500,000 Units total) by mouth 4 (four) times daily. 60 mL 1  ? ondansetron (ZOFRAN) 8 MG tablet Take 1 tablet (8 mg total) by mouth 2 (two) times daily as needed for refractory nausea / vomiting. Start on day 3 after chemotherapy. 30 tablet 1  ? polyethylene glycol (MIRALAX / GLYCOLAX) 17 g packet Take 17 g by mouth daily as needed for moderate constipation (pt takes it daily for 7 days after each chemo treatment).    ? potassium chloride SA  (KLOR-CON M) 20 MEQ tablet TAKE 1 TABLET BY MOUTH DAILY 28 tablet 0  ? prochlorperazine (COMPAZINE) 10 MG tablet Take 1 tablet (10 mg total) by mouth every 6 (six) hours as needed (Nausea or vomiting). 30 tablet 1  ? QUEtiapine (SEROQUEL) 25 MG tablet Take 1 tablet (25 mg total) by mouth at bedtime. 30 tablet 2  ? senna-docusate (SENOKOT-S) 8.6-50 MG tablet Take 1 tablet by mouth daily as needed for mild constipation (pt takes the tablet 2 a dayfor 5 days after chemotherapy treatment).    ? sodium chloride 1 g tablet Take 1 g by mouth in the morning, at noon, in the evening, and at bedtime.    ? TRELEGY ELLIPTA 100-62.5-25 MCG/ACT AEPB INHALE 1 PUFF INTO THE LUNGS DAILY 60 each 3  ? VITAMIN MIXTURE PO Take 1 tablet by mouth  in the morning, at noon, and at bedtime. Balance of nature - fruit    ? VITAMIN MIXTURE PO Take 1 tablet by mouth 3 (three) times daily. 1 tablet by mouth morning, noon, and night ?Balance of Nature: veggie    ? losartan-hydrochlorothiazide (HYZAAR) 100-25 MG tablet Take 1 tablet by mouth daily. (Patient not taking: Reported on 12/17/2021)    ? nitrofurantoin (MACRODANTIN) 100 MG capsule Take 100 mg by mouth 2 (two) times daily.    ? ?No current facility-administered medications for this visit.  ? ?Facility-Administered Medications Ordered in Other Visits  ?Medication Dose Route Frequency Provider Last Rate Last Admin  ? 0.9 %  sodium chloride infusion   Intravenous Once Earlie Server, MD      ? heparin lock flush 100 unit/mL  500 Units Intravenous Once Lena Gores, Kirt Boys, NP      ? sodium chloride flush (NS) 0.9 % injection 10 mL  10 mL Intravenous Once [place], Kirt Boys, NP      ? ? ?VITAL SIGNS: ?BP 137/66 (BP Location: Left Arm, Patient Position: Sitting)   Pulse 85   Temp 97.7 ?F (36.5 ?C) (Oral)   Resp 18   Wt 155 lb 12.8 oz (70.7 kg)   SpO2 100%   BMI 24.40 kg/m?  ?Filed Weights  ? 12/17/21 0904  ?Weight: 155 lb 12.8 oz (70.7 kg)  ? ?  ?Estimated body mass index is 24.4 kg/m? as calculated  from the following: ?  Height as of 12/08/21: 5\' 7"  (1.702 m). ?  Weight as of this encounter: 155 lb 12.8 oz (70.7 kg). ? ?LABS: ?CBC: ?   ?Component Value Date/Time  ? WBC 3.4 (L) 12/08/2021 0837  ? HGB 8.3

## 2021-12-17 NOTE — Progress Notes (Signed)
Patient tolerated 1L IVF infusion well, no questions/concerns voiced. Patient stable at discharge. AVS given. Patient aware of blood transfusion tomorrow. ?

## 2021-12-18 ENCOUNTER — Inpatient Hospital Stay: Payer: Medicare Other

## 2021-12-18 DIAGNOSIS — D649 Anemia, unspecified: Secondary | ICD-10-CM

## 2021-12-18 DIAGNOSIS — C3490 Malignant neoplasm of unspecified part of unspecified bronchus or lung: Secondary | ICD-10-CM

## 2021-12-18 DIAGNOSIS — Z5111 Encounter for antineoplastic chemotherapy: Secondary | ICD-10-CM | POA: Diagnosis not present

## 2021-12-18 MED ORDER — HEPARIN SOD (PORK) LOCK FLUSH 100 UNIT/ML IV SOLN
500.0000 [IU] | Freq: Every day | INTRAVENOUS | Status: AC | PRN
  Administered 2021-12-18: 500 [IU]
  Filled 2021-12-18: qty 5

## 2021-12-18 MED ORDER — ACETAMINOPHEN 325 MG PO TABS
650.0000 mg | ORAL_TABLET | Freq: Once | ORAL | Status: AC
  Administered 2021-12-18: 650 mg via ORAL
  Filled 2021-12-18: qty 2

## 2021-12-18 MED ORDER — SODIUM CHLORIDE 0.9% FLUSH
10.0000 mL | INTRAVENOUS | Status: AC | PRN
  Administered 2021-12-18: 10 mL
  Filled 2021-12-18: qty 10

## 2021-12-18 MED ORDER — DIPHENHYDRAMINE HCL 25 MG PO CAPS
25.0000 mg | ORAL_CAPSULE | Freq: Once | ORAL | Status: AC
  Administered 2021-12-18: 25 mg via ORAL
  Filled 2021-12-18: qty 1

## 2021-12-18 MED ORDER — SODIUM CHLORIDE 0.9% IV SOLUTION
250.0000 mL | Freq: Once | INTRAVENOUS | Status: AC
  Administered 2021-12-18: 250 mL via INTRAVENOUS
  Filled 2021-12-18: qty 250

## 2021-12-19 LAB — TYPE AND SCREEN
ABO/RH(D): O POS
Antibody Screen: NEGATIVE
Unit division: 0

## 2021-12-19 LAB — BPAM RBC
Blood Product Expiration Date: 202305152359
ISSUE DATE / TIME: 202304280915
Unit Type and Rh: 5100

## 2021-12-20 ENCOUNTER — Other Ambulatory Visit: Payer: Self-pay | Admitting: Oncology

## 2021-12-21 ENCOUNTER — Encounter: Payer: Self-pay | Admitting: Oncology

## 2021-12-22 ENCOUNTER — Encounter: Payer: Self-pay | Admitting: Oncology

## 2021-12-22 ENCOUNTER — Encounter: Payer: Self-pay | Admitting: Nurse Practitioner

## 2021-12-22 ENCOUNTER — Telehealth: Payer: Medicare Other | Admitting: Nurse Practitioner

## 2021-12-22 ENCOUNTER — Other Ambulatory Visit: Payer: Self-pay

## 2021-12-22 DIAGNOSIS — R5381 Other malaise: Secondary | ICD-10-CM

## 2021-12-22 DIAGNOSIS — C3490 Malignant neoplasm of unspecified part of unspecified bronchus or lung: Secondary | ICD-10-CM

## 2021-12-22 DIAGNOSIS — Z515 Encounter for palliative care: Secondary | ICD-10-CM

## 2021-12-22 DIAGNOSIS — R531 Weakness: Secondary | ICD-10-CM

## 2021-12-22 MED ORDER — POTASSIUM CHLORIDE CRYS ER 20 MEQ PO TBCR: 20.0000 meq | EXTENDED_RELEASE_TABLET | Freq: Every day | ORAL | 1 refills | Status: DC

## 2021-12-22 NOTE — Progress Notes (Addendum)
[place] Consult Note Telephone: [phone]  Fax: [phone]    Date of encounter: 12/22/21 4:27 PM PATIENT NAME: Beth Mcmillan [address] [place] [address]   [phone] (home) [phone] (work) DOB: 05/16/1946 MRN: [id number] PRIMARY CARE PROVIDER:    Idelle Crouch, MD,  [address] [facility] [place] [address] [phone]  REFERRING PROVIDER:   Idelle Crouch, MD [facility] [place],  [place] [address] [phone]  RESPONSIBLE PARTY:    Contact Information     Name Relation Home Work Mobile   cole,nikki Daughter [phone]   YOKO, MCGAHEE Spouse [phone]  [phone]   [place], [employer] Son  [phone]       Due to the COVID-19 crisis, this visit was done via telemedicine from my office and it was initiated and consent by this patient and or family.  I connected with Minda Ditto, Ms. Burson daughter with Mr and  Deven Audi OR PROXY on 12/22/21 by a video enabled telemedicine application and verified that I am speaking with the correct person using two identifiers.   I discussed the limitations of evaluation and management by telemedicine. The patient expressed understanding and agreed to proceed. Palliative Care was asked to follow this patient by consultation request of  [place], Leonie Douglas, MD to address advance care planning and complex medical decision making. This is a follow up visit.                                  ASSESSMENT AND PLAN / RECOMMENDATIONS:  Symptom Management/Plan: 1. Advance Care Planning;  Full code discussed scenarios and continues to wish to have CPR  2. Generalized weakness secondary to small cell lung cancer, discussed energy conservation; rest. We talked about a w/c as would be very helpful for transport, tranfers as with increase in weakness, impaired gait,  falls at home  Order: Wheelchair with both foot peddles; please exchange current wheel chair out with light weight wheelchair Dx:  (M62.81) generalized weakness (R26) abnormality of gait, mobility (C34.90) Small Cell lung cancer (K81.0XXA) falls Weight 155 lbs; height 5'7"  3. Goals of Care: Goals include to maximize quality of life and symptom management. Our advance care planning conversation included a discussion about:    The value and importance of advance care planning  Exploration of personal, cultural or spiritual beliefs that might influence medical decisions  Exploration of goals of care in the event of a sudden injury or illness  Identification and preparation of a healthcare agent  Review and updating or creation of an advance directive document.  4. Palliative care encounter; Palliative care encounter; Palliative medicine team will continue to support patient, patient's family, and medical team. Visit consisted of counseling and education dealing with the complex and emotionally intense issues of symptom management and palliative care in the setting of serious and potentially life-threatening illness  Follow up Palliative Care Visit: Palliative care will continue to follow for complex medical decision making, advance care planning, and clarification of goals. Return 4 weeks or prn.  I spent 61 minutes providing this consultation. More than 50% of the time in this consultation was spent in counseling and care coordination.  PPS: 40%  Chief Complaint: Initial palliative consult for complex medical decision making  HISTORY OF PRESENT ILLNESS:  Beth Mcmillan is a 76 y.o.  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 I connected with Beth Mcmillan, Mr Bacot and daughter Lexine Baton. We talked past medical history, family history, life review. We talked about ros, symptoms, appetite, nutrition, mobility, transfers, adl's. We talked about chemotherapy, dx cancer. We talked about plan, medical goals including CPR. Beth Mcmillan endorses she would like to have CPR, full scope treatment. We talked about role pc in poc. We talked about need for w/c as it is more difficult for Beth Mcmillan to ambulate more than short distances, falling, generalized weakness, debility. Will order w/c. Therapeutic listening, emotional support provided. Contact information provided. Questions answered. We talked about f/u PC visit with PC RN in 2 weeks then 4 weeks for [facility] NP. Mrs. Doeden in agreement, scheduled.   History obtained from review of EMR, discussion with daughter, Lexine Baton with Mr and Ms. [place].  I reviewed available labs, medications, imaging, studies and related documents from the EMR.  Records reviewed and summarized above.   ROS 10 point system reviewed all negative except HPI  Physical Exam: deferred  Thank you for the opportunity to participate in the care of [name].  The palliative care team will continue to follow. Please call our office at [phone] if we can be of additional assistance.   Keshun Berrett Ihor Gully, NP

## 2021-12-24 ENCOUNTER — Other Ambulatory Visit: Payer: Self-pay

## 2021-12-24 ENCOUNTER — Other Ambulatory Visit: Payer: Self-pay | Admitting: *Deleted

## 2021-12-24 ENCOUNTER — Other Ambulatory Visit: Payer: Self-pay | Admitting: Oncology

## 2021-12-24 ENCOUNTER — Ambulatory Visit
Admission: RE | Admit: 2021-12-24 | Discharge: 2021-12-24 | Disposition: A | Discharge status: Home / Self Care | Payer: Medicare Other | Source: Ambulatory Visit | Attending: Oncology | Admitting: Oncology

## 2021-12-24 ENCOUNTER — Telehealth: Payer: Self-pay | Admitting: *Deleted

## 2021-12-24 DIAGNOSIS — C3490 Malignant neoplasm of unspecified part of unspecified bronchus or lung: Secondary | ICD-10-CM | POA: Insufficient documentation

## 2021-12-24 DIAGNOSIS — C7931 Secondary malignant neoplasm of brain: Secondary | ICD-10-CM

## 2021-12-24 MED ORDER — GADOBUTROL 1 MMOL/ML IV SOLN
7.0000 mL | Freq: Once | INTRAVENOUS | Status: AC | PRN
  Administered 2021-12-24: 7 mL via INTRAVENOUS

## 2021-12-24 MED ORDER — DEXAMETHASONE 4 MG PO TABS
8.0000 mg | ORAL_TABLET | Freq: Two times a day (BID) | ORAL | 0 refills | Status: DC
Start: 2021-12-24 — End: 2022-01-19

## 2021-12-24 NOTE — Progress Notes (Signed)
Decadron prescription ?

## 2021-12-24 NOTE — Telephone Encounter (Signed)
Dr. Janese Banks called Dr. Baruch Gouty to see with the new information from the scan patient will need radiation.  Dr. Baruch Gouty says that he could do the [facility].  Dr. Janese Banks called and spoke to the daughter of the patient letting her know what they have found out and that she will need radiation again.  Also Dr. Janese Banks put her on steroids and told the the daughter and she will go and get the medicine at the pharmacy and have patient start taking it.  Vicente Males and radiation called the daughter and gave her the appointment date and time. ?

## 2021-12-24 NOTE — Telephone Encounter (Signed)
Called report ? ?IMPRESSION: ?1. Two new subcentimeter metastatic lesions in the left lentiform ?nucleus and inferior frontal gyrus with significant surrounding ?edema but no midline shift. ?2. Unchanged punctate focus of enhancement in the right cerebellar ?hemisphere at the site of a previously treated metastasis. ?  ?  ?Electronically Signed ?  By: Valetta Mole M.D. ?  On: 12/24/2021 13:08 ?

## 2021-12-25 ENCOUNTER — Encounter: Payer: Self-pay | Admitting: Radiation Oncology

## 2021-12-25 ENCOUNTER — Encounter: Payer: Self-pay | Admitting: Oncology

## 2021-12-25 ENCOUNTER — Ambulatory Visit
Admission: RE | Admit: 2021-12-25 | Discharge: 2021-12-25 | Disposition: A | Discharge status: Home / Self Care | Payer: Medicare Other | Source: Ambulatory Visit | Attending: Radiation Oncology | Admitting: Radiation Oncology

## 2021-12-25 VITALS — BP 151/82 | HR 84 | Resp 18

## 2021-12-25 DIAGNOSIS — C7931 Secondary malignant neoplasm of brain: Secondary | ICD-10-CM

## 2021-12-25 DIAGNOSIS — R609 Edema, unspecified: Secondary | ICD-10-CM | POA: Insufficient documentation

## 2021-12-25 DIAGNOSIS — C7951 Secondary malignant neoplasm of bone: Secondary | ICD-10-CM | POA: Diagnosis not present

## 2021-12-25 DIAGNOSIS — C3432 Malignant neoplasm of lower lobe, left bronchus or lung: Secondary | ICD-10-CM | POA: Insufficient documentation

## 2021-12-25 DIAGNOSIS — R410 Disorientation, unspecified: Secondary | ICD-10-CM | POA: Diagnosis not present

## 2021-12-25 DIAGNOSIS — Z923 Personal history of irradiation: Secondary | ICD-10-CM | POA: Diagnosis not present

## 2021-12-25 NOTE — Progress Notes (Signed)
Radiation Oncology ?Follow up Note old patient new area recurrent brain mets ? ?Name: Beth Mcmillan   ?Date:   12/25/2021 ?MRN:  [id number] ?DOB: [birth year]  ? ? ?This 76 y.o. female presents to the clinic today for reevaluation of 2 solitary brain metastasis in a patient.  Previously treated to her brain as well as SBRT to her left lower lobe for stage IV metastatic small cell lung cancer ? ?REFERRING PROVIDER: Idelle Crouch, MD ? ?HPI: Patient is a 76 year old female now out approximately 1 year having completed whole brain radiation therapy.  For metastatic small cell lung cancer with bone and brain metastasis.  She also received SBRT back earlier this year.  Recent PET CT scan which I reviewed back in March shows multiple small review tracer avid skeletal metastasis with no metabolic activity in the left lower lobe treated pulmonary lesion.  She has been having some confusion and some decreased visual acuity MRI scan yesterday showed 2 new subcentimeter metastatic lesions in left lentiform nucleus and inferior frontal gyrus with significant surrounding edema but no midline shift.  She had unchanged punctate focus of enhancement in the right cerebellar hemisphere at the site of previously treated metastatic disease.  She is now referred for consideration of salvage radiation therapy.  She specifically denies any focal neurologic deficit. ? ?COMPLICATIONS OF TREATMENT: none ? ?FOLLOW UP COMPLIANCE: keeps appointments  ? ?PHYSICAL EXAM:  ?BP (!) 151/82   Pulse 84   Resp 18  ?Crude visual fields were within normal limits.  Motor or sensory in detail levels are equal and symmetric upper lower extremities proprioception is intact.  Well-developed well-nourished patient in NAD. HEENT reveals PERLA, EOMI, discs not visualized.  Oral cavity is clear. No oral mucosal lesions are identified. Neck is clear without evidence of cervical or supraclavicular adenopathy. Lungs are clear to A&P. Cardiac examination is  essentially unremarkable with regular rate and rhythm without murmur rub or thrill. Abdomen is benign with no organomegaly or masses noted. Motor sensory and DTR levels are equal and symmetric in the upper and lower extremities. Cranial nerves II through XII are grossly intact. Proprioception is intact. No peripheral adenopathy or edema is identified. No motor or sensory levels are noted. Crude visual fields are within normal range. ? ?RADIOLOGY RESULTS: MRI scan PET scan reviewed compatible with above-stated findings ? ?PLAN: At this time elect go ahead with salvage radiation therapy we will use SRS and treat both lesions to 18 Gray.  I have ordered a 6 thin slice MRI scan for treatment planning purposes.  We will also ordered CT simulation.  Risks and benefits of treatment including fatigue, hair loss, alteration of blood counts fatigue all were discussed with the patient her husband and her daughter.  After a thin slice MRI scan has been ordered we will schedule CT simulation and single fraction treatment. ? ?I would like to take this opportunity to thank you for allowing me to participate in the care of your patient.. ?  ? Noreene Filbert, MD ? ?

## 2021-12-29 ENCOUNTER — Inpatient Hospital Stay: Payer: Medicare Other

## 2021-12-29 ENCOUNTER — Encounter: Payer: Self-pay | Admitting: Oncology

## 2021-12-29 ENCOUNTER — Inpatient Hospital Stay (HOSPITAL_BASED_OUTPATIENT_CLINIC_OR_DEPARTMENT_OTHER): Payer: Medicare Other | Admitting: Oncology

## 2021-12-29 ENCOUNTER — Inpatient Hospital Stay: Payer: Medicare Other | Attending: Oncology

## 2021-12-29 ENCOUNTER — Ambulatory Visit: Payer: Medicare Other | Admitting: Oncology

## 2021-12-29 ENCOUNTER — Encounter: Payer: Self-pay | Admitting: Occupational Therapy

## 2021-12-29 VITALS — BP 175/74 | HR 72 | Temp 97.6°F

## 2021-12-29 VITALS — BP 170/85 | HR 76 | Resp 16 | Wt 152.4 lb

## 2021-12-29 DIAGNOSIS — Z881 Allergy status to other antibiotic agents status: Secondary | ICD-10-CM | POA: Diagnosis not present

## 2021-12-29 DIAGNOSIS — R410 Disorientation, unspecified: Secondary | ICD-10-CM | POA: Insufficient documentation

## 2021-12-29 DIAGNOSIS — C3432 Malignant neoplasm of lower lobe, left bronchus or lung: Secondary | ICD-10-CM | POA: Insufficient documentation

## 2021-12-29 DIAGNOSIS — Z8349 Family history of other endocrine, nutritional and metabolic diseases: Secondary | ICD-10-CM | POA: Insufficient documentation

## 2021-12-29 DIAGNOSIS — Z818 Family history of other mental and behavioral disorders: Secondary | ICD-10-CM | POA: Insufficient documentation

## 2021-12-29 DIAGNOSIS — C3490 Malignant neoplasm of unspecified part of unspecified bronchus or lung: Secondary | ICD-10-CM | POA: Diagnosis not present

## 2021-12-29 DIAGNOSIS — Z79899 Other long term (current) drug therapy: Secondary | ICD-10-CM | POA: Insufficient documentation

## 2021-12-29 DIAGNOSIS — Z880 Allergy status to penicillin: Secondary | ICD-10-CM | POA: Diagnosis not present

## 2021-12-29 DIAGNOSIS — Z8719 Personal history of other diseases of the digestive system: Secondary | ICD-10-CM | POA: Diagnosis not present

## 2021-12-29 DIAGNOSIS — E876 Hypokalemia: Secondary | ICD-10-CM

## 2021-12-29 DIAGNOSIS — Z87891 Personal history of nicotine dependence: Secondary | ICD-10-CM | POA: Diagnosis not present

## 2021-12-29 DIAGNOSIS — C7951 Secondary malignant neoplasm of bone: Secondary | ICD-10-CM | POA: Diagnosis not present

## 2021-12-29 DIAGNOSIS — Z923 Personal history of irradiation: Secondary | ICD-10-CM | POA: Insufficient documentation

## 2021-12-29 DIAGNOSIS — C7889 Secondary malignant neoplasm of other digestive organs: Secondary | ICD-10-CM | POA: Insufficient documentation

## 2021-12-29 DIAGNOSIS — Z882 Allergy status to sulfonamides status: Secondary | ICD-10-CM | POA: Diagnosis not present

## 2021-12-29 DIAGNOSIS — Z885 Allergy status to narcotic agent status: Secondary | ICD-10-CM | POA: Diagnosis not present

## 2021-12-29 DIAGNOSIS — Z8589 Personal history of malignant neoplasm of other organs and systems: Secondary | ICD-10-CM | POA: Diagnosis not present

## 2021-12-29 DIAGNOSIS — C7931 Secondary malignant neoplasm of brain: Secondary | ICD-10-CM | POA: Insufficient documentation

## 2021-12-29 DIAGNOSIS — Z5111 Encounter for antineoplastic chemotherapy: Secondary | ICD-10-CM

## 2021-12-29 DIAGNOSIS — Z8249 Family history of ischemic heart disease and other diseases of the circulatory system: Secondary | ICD-10-CM | POA: Insufficient documentation

## 2021-12-29 DIAGNOSIS — R5383 Other fatigue: Secondary | ICD-10-CM | POA: Diagnosis not present

## 2021-12-29 DIAGNOSIS — Z8049 Family history of malignant neoplasm of other genital organs: Secondary | ICD-10-CM | POA: Diagnosis not present

## 2021-12-29 DIAGNOSIS — Z7952 Long term (current) use of systemic steroids: Secondary | ICD-10-CM | POA: Insufficient documentation

## 2021-12-29 DIAGNOSIS — Z888 Allergy status to other drugs, medicaments and biological substances status: Secondary | ICD-10-CM | POA: Insufficient documentation

## 2021-12-29 DIAGNOSIS — Z801 Family history of malignant neoplasm of trachea, bronchus and lung: Secondary | ICD-10-CM | POA: Diagnosis not present

## 2021-12-29 LAB — CBC WITH DIFFERENTIAL/PLATELET
Abs Immature Granulocytes: 2.05 10*3/uL — ABNORMAL HIGH (ref 0.00–0.07)
Basophils Absolute: 0 10*3/uL (ref 0.0–0.1)
Basophils Relative: 0 %
Eosinophils Absolute: 0 10*3/uL (ref 0.0–0.5)
Eosinophils Relative: 0 %
HCT: 30.1 % — ABNORMAL LOW (ref 36.0–46.0)
Hemoglobin: 10.2 g/dL — ABNORMAL LOW (ref 12.0–15.0)
Immature Granulocytes: 10 %
Lymphocytes Relative: 2 %
Lymphs Abs: 0.4 10*3/uL — ABNORMAL LOW (ref 0.7–4.0)
MCH: 32.1 pg (ref 26.0–34.0)
MCHC: 33.9 g/dL (ref 30.0–36.0)
MCV: 94.7 fL (ref 80.0–100.0)
Monocytes Absolute: 1.1 10*3/uL — ABNORMAL HIGH (ref 0.1–1.0)
Monocytes Relative: 6 %
Neutro Abs: 16.2 10*3/uL — ABNORMAL HIGH (ref 1.7–7.7)
Neutrophils Relative %: 82 %
Platelets: 243 10*3/uL (ref 150–400)
RBC: 3.18 MIL/uL — ABNORMAL LOW (ref 3.87–5.11)
RDW: 18.4 % — ABNORMAL HIGH (ref 11.5–15.5)
WBC: 19.8 10*3/uL — ABNORMAL HIGH (ref 4.0–10.5)
nRBC: 0.2 % (ref 0.0–0.2)

## 2021-12-29 LAB — COMPREHENSIVE METABOLIC PANEL
ALT: 39 U/L (ref 0–44)
AST: 24 U/L (ref 15–41)
Albumin: 3.5 g/dL (ref 3.5–5.0)
Alkaline Phosphatase: 78 U/L (ref 38–126)
Anion gap: 8 (ref 5–15)
BUN: 31 mg/dL — ABNORMAL HIGH (ref 8–23)
CO2: 22 mmol/L (ref 22–32)
Calcium: 8.8 mg/dL — ABNORMAL LOW (ref 8.9–10.3)
Chloride: 97 mmol/L — ABNORMAL LOW (ref 98–111)
Creatinine, Ser: 1.1 mg/dL — ABNORMAL HIGH (ref 0.44–1.00)
GFR, Estimated: 53 mL/min — ABNORMAL LOW (ref >60)
Glucose, Bld: 141 mg/dL — ABNORMAL HIGH (ref 70–99)
Potassium: 2.9 mmol/L — ABNORMAL LOW (ref 3.5–5.1)
Sodium: 127 mmol/L — ABNORMAL LOW (ref 135–145)
Total Bilirubin: 0.5 mg/dL (ref 0.3–1.2)
Total Protein: 6.5 g/dL (ref 6.5–8.1)

## 2021-12-29 MED ORDER — SODIUM CHLORIDE 0.9 % IV SOLN
10.0000 mg | Freq: Once | INTRAVENOUS | Status: AC
  Administered 2021-12-29: 10 mg via INTRAVENOUS
  Filled 2021-12-29: qty 10

## 2021-12-29 MED ORDER — PALONOSETRON HCL INJECTION 0.25 MG/5ML
0.2500 mg | Freq: Once | INTRAVENOUS | Status: AC
  Administered 2021-12-29: 0.25 mg via INTRAVENOUS
  Filled 2021-12-29: qty 5

## 2021-12-29 MED ORDER — SODIUM CHLORIDE 0.9 % IV SOLN
2.6000 mg/m2 | Freq: Once | INTRAVENOUS | Status: AC
  Administered 2021-12-29: 4.7 mg via INTRAVENOUS
  Filled 2021-12-29: qty 9.4

## 2021-12-29 MED ORDER — HEPARIN SOD (PORK) LOCK FLUSH 100 UNIT/ML IV SOLN
500.0000 [IU] | Freq: Once | INTRAVENOUS | Status: AC | PRN
  Filled 2021-12-29: qty 5

## 2021-12-29 MED ORDER — HEPARIN SOD (PORK) LOCK FLUSH 100 UNIT/ML IV SOLN
INTRAVENOUS | Status: AC
  Administered 2021-12-29: 500 [IU]
  Filled 2021-12-29: qty 5

## 2021-12-29 MED ORDER — POTASSIUM CHLORIDE IN NACL 20-0.9 MEQ/L-% IV SOLN
Freq: Once | INTRAVENOUS | Status: AC
  Filled 2021-12-29: qty 1000

## 2021-12-29 MED ORDER — ZOLEDRONIC ACID 4 MG/5ML IV CONC
3.5000 mg | INTRAVENOUS | Status: DC
  Administered 2021-12-29: 3.5 mg via INTRAVENOUS
  Filled 2021-12-29: qty 4.38

## 2021-12-29 MED ORDER — SODIUM CHLORIDE 0.9 % IV SOLN
Freq: Once | INTRAVENOUS | Status: AC
  Filled 2021-12-29: qty 250

## 2021-12-29 NOTE — Progress Notes (Signed)
Dr. Donella Stade advised one 30 minute round of radiation; pt is super anxious about the headpiece daughter will like to know if she can have a medication for that day ; feet have been swelling believes it is due to the BP medication.Also, pt has not been able to sleep well since starting the new dexamethasone regimen.  ?

## 2021-12-29 NOTE — Patient Instructions (Addendum)
[facility] AT [place]  Discharge Instructions: ?Thank you for choosing [facility] to provide your oncology and hematology care.  ?If you have a lab appointment with the [facility], please go directly to the [facility] and check in at the registration area. ? ?Wear comfortable clothing and clothing appropriate for easy access to any Portacath or PICC line.  ? ?We strive to give you quality time with your provider. You may need to reschedule your appointment if you arrive late (15 or more minutes).  Arriving late affects you and other patients whose appointments are after yours.  Also, if you miss three or more appointments without notifying the office, you may be dismissed from the clinic at the provider?s discretion.    ?  ?For prescription refill requests, have your pharmacy contact our office and allow 72 hours for refills to be completed.   ? ?Today you received the following chemotherapy and/or immunotherapy agents Lurbinectedin  ?  ?To help prevent nausea and vomiting after your treatment, we encourage you to take your nausea medication as directed. ? ?BELOW ARE SYMPTOMS THAT SHOULD BE REPORTED IMMEDIATELY: ?*FEVER GREATER THAN 100.4 F (38 ?C) OR HIGHER ?*CHILLS OR SWEATING ?*NAUSEA AND VOMITING THAT IS NOT CONTROLLED WITH YOUR NAUSEA MEDICATION ?*UNUSUAL SHORTNESS OF BREATH ?*UNUSUAL BRUISING OR BLEEDING ?*URINARY PROBLEMS (pain or burning when urinating, or frequent urination) ?*BOWEL PROBLEMS (unusual diarrhea, constipation, pain near the anus) ?TENDERNESS IN MOUTH AND THROAT WITH OR WITHOUT PRESENCE OF ULCERS (sore throat, sores in mouth, or a toothache) ?UNUSUAL RASH, SWELLING OR PAIN  ?UNUSUAL VAGINAL DISCHARGE OR ITCHING  ? ?Items with * indicate a potential emergency and should be followed up as soon as possible or go to the Emergency Department if any problems should occur. ? ?Please show the CHEMOTHERAPY ALERT CARD or IMMUNOTHERAPY ALERT CARD at check-in to  the Emergency Department and triage nurse. ? ?Should you have questions after your visit or need to cancel or reschedule your appointment, please contact [facility] [place] [facility] [place]  [phone] and follow the prompts.  Office hours are 8:00 a.m. to 4:30 p.m. Monday - Friday. Please note that voicemails left after 4:00 p.m. may not be returned until the following business day.  We are closed weekends and major holidays. You have access to a nurse at all times for urgent questions. Please call the main number to the clinic [phone] and follow the prompts. ? ?For any non-urgent questions, you may also contact your provider using MyChart. We now offer e-Visits for anyone 42 and older to request care online for non-urgent symptoms. For details visit [email or web address]. ?  ?Also download the MyChart app! Go to the app store, search "MyChart", open the app, select [facility], and log in with your MyChart username and password. ? ?Due to Covid, a mask is required upon entering the hospital/clinic. If you do not have a mask, one will be given to you upon arrival. For doctor visits, patients may have 1 support person aged 65 or older with them. For treatment visits, patients cannot have anyone with them due to current Covid guidelines and our immunocompromised population.  ? ?Durvalumab injection ?What is this medication? ?DURVALUMAB (dur VAL ue mab) is a monoclonal antibody. It is used to treat lung cancer. ?This medicine may be used for other purposes; ask your health care provider or pharmacist if you have questions. ?COMMON BRAND NAME(S): [email or web address] ?What should I tell my care team before I take this medication? ?  They need to know if you have any of these conditions: ?autoimmune diseases like Crohn's disease, ulcerative colitis, or lupus ?have had or planning to have an allogeneic stem cell transplant (uses someone else's stem cells) ?history of organ transplant ?history of radiation to the  chest ?nervous system problems like myasthenia gravis or Guillain-Barre syndrome ?an unusual or allergic reaction to durvalumab, other medicines, foods, dyes, or preservatives ?pregnant or trying to get pregnant ?breast-feeding ?How should I use this medication? ?This medicine is for infusion into a vein. It is given by a health care professional in a hospital or clinic setting. ?A special MedGuide will be given to you before each treatment. Be sure to read this information carefully each time. ?Talk to your pediatrician regarding the use of this medicine in children. Special care may be needed. ?Overdosage: If you think you have taken too much of this medicine contact a poison control center or emergency room at once. ?NOTE: This medicine is only for you. Do not share this medicine with others. ?What if I miss a dose? ?It is important not to miss your dose. Call your doctor or health care professional if you are unable to keep an appointment. ?What may interact with this medication? ?Interactions have not been studied. ?This list may not describe all possible interactions. Give your health care provider a list of all the medicines, herbs, non-prescription drugs, or dietary supplements you use. Also tell them if you smoke, drink alcohol, or use illegal drugs. Some items may interact with your medicine. ?What should I watch for while using this medication? ?This medication may make you feel generally unwell. Continue your course of treatment even though you feel ill unless your care team tells you to stop. ?You may need blood work done while you are taking this medication. ?Do not become pregnant while taking this medication or for 3 months after stopping it. Women should inform their care team if they wish to become pregnant or think they might be pregnant. There is a potential for serious side effects to an unborn child. Talk to your care team or pharmacist for more information. Do not breast-feed an infant while  taking this medication or for 3 months after stopping it. ?What side effects may I notice from receiving this medication? ?Side effects that you should report to your care team as soon as possible: ?Allergic reactions--skin rash, itching, hives, swelling of the face, lips, tongue, or throat ?Bloody or watery diarrhea ?Dizziness, loss of balance or coordination, confusion or trouble speaking ?Dry cough, shortness of breath or trouble breathing ?Flushing, mostly over the face, neck, and chest, during injection ?High blood sugar (hyperglycemia)--increased thirst or amount of urine, unusual weakness or fatigue, blurry vision ?High thyroid levels (hyperthyroidism)--fast or irregular heartbeat, weight loss, excessive sweating or sensitivity to heat, tremors or shaking, anxiety, nervousness, irregular menstrual cycle or spotting ?Infection--fever, chills, cough, or sore throat ?Liver injury--right upper belly pain, loss of appetite, nausea, light-colored stool, dark yellow or brown urine, yellowing skin or eyes, unusual weakness or fatigue ?Low adrenal gland function--nausea, vomiting, loss of appetite, unusual weakness or fatigue, dizziness, low blood pressure ?Low thyroid levels (hypothyroidism)--unusual weakness or fatigue, increased sensitivity to cold, constipation, hair loss, dry skin, weight gain, feelings of depression ?Pancreatitis--severe stomach pain that spreads to your back or gets worse after eating or when touched, fever, nausea, vomiting ?Rash, fever, and swollen lymph nodes ?Redness, blistering, peeling or loosening of the skin, including inside the mouth ?Wheezing--trouble breathing with loud or  whistling sounds ?Side effects that usually do not require medical attention (report these to your care team if they continue or are bothersome): ?Fatigue ?Hair loss ?This list may not describe all possible side effects. Call your doctor for medical advice about side effects. You may report side effects to FDA at  1-800-FDA-1088. ?Where should I keep my medication? ?This medication is given in a hospital or clinic. It will not be stored at home. ?NOTE: This sheet is a summary. It may not cover all possible informati

## 2021-12-29 NOTE — Progress Notes (Signed)
Nutrition Follow-up: ? ?Patient with small cell lung cancer with metastases to bone and brain s/p whole brain radiation and 4 cycles of carbo, etoposide, tecentriq, completed 03/10/21.  Noted disease progression on March 2022 with new bone and pancreatic metastases.Patient receiving lurbinectedin. ? ?Met with patient during infusion.  Patient reports that her appetite is good.  Usually eats crackers for breakfast and rice krispie bar.  Lunch yesterday was 2 eggs with ham, toast and jelly.  Last night for dinner ate banana sandwich.  Drinking 3 boost soothe (strawberry kiwi) per day and gatorade.   ? ? ? ?Medications: reviewed ? ?Labs: reviewed ? ?Anthropometrics:  ? ?Weight 152 lb 6.4 oz  ?155 lb 12.8 oz on 4/27 ?152 lb 12.8 oz on 3/28 ?149 lb on 1/24 ? ? ?NUTRITION DIAGNOSIS: Inadequate oral intake improved ? ? ?INTERVENTION:  ?Encouraged patient to continue eating good sources of protein ?Continue boost soothe shakes for added nutrition ?  ? ?MONITORING, EVALUATION, GOAL: weight trends, intake ? ? ?NEXT VISIT: as needed ? ?Chidi Shirer B. Zenia Resides, RD, LDN ?Registered Dietitian ?[phone] ? ? ?

## 2021-12-29 NOTE — Progress Notes (Signed)
Labs and vitals reviewed with MD and treatment team. Per MD to proceed with treatment and give 1L of NS with 20 mEq K and zometa. Treatment team and pt updated. Per MD pt.'s PCP will readjust BP medications. Pt updated and all questions answered at this time. Pt has no complaints at this time and states she feels fine. Pt stable at discharge.  ? ?Beth Mcmillan  ?

## 2021-12-29 NOTE — Progress Notes (Signed)
? ? ? ?Hematology/Oncology Consult note ?[facility]  ?[phone] [phone] ? ?Patient Care Team: ?Idelle Crouch, MD as PCP - General (Internal Medicine) ?Telford Nab, RN as Sales executive ?Sindy Guadeloupe, MD as Attending Physician (Hematology)  ? ?Name of the patient: Beth Mcmillan  ?[id number]  ?[birth year]  ? ?Date of visit: 12/29/21 ? 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 ?Chief complaint/ Reason for visit-on treatment assessment prior to cycle 3 of lurbinectedin ? ?Heme/Onc history: Patient is a 76 year old female with extensive smoking history and currently smokes half a pack per day.  She underwent CT chest with contrast in March 2022 following an abnormal x-ray which showed a 2.6 cm mass in the left lower lobe.  This was followed by a PET CT scan which showed a 3.0 x 2.0 cm mass in the left lower lobe of the lung with an SUV of 9.7.  No evidence of locoregional adenopathy.  No evidence of intra-abdominal disease with patient was noted to have a hypermetabolic focus in the right iliac bone with an SUV of 11.1 which was suspicious for metastatic disease.  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  Cells were positive for TTF-1, CD56 and chromogranin.  Right iliac bone biopsy was done and results were consistent with small cell lung cancer ?  ?Patient received 1 dose of carbo etoposide Tecentriq chemotherapy and following that she had an MRI brain which showedAt least 3 distinct 0.5 to 1 cm lesions in the right parieto-occipital region as well as cerebellum with mild associated edema.  Patient completed whole brain radiation treatment and completed carbo etoposide Tecentriq 4 cycles on 03/10/2021.  Disease progression in March 2022 with new bone metastases and pancreatic metastases ?  ? ?Interval history-reports having  blurry vision in both her eyes.  Symptoms have not improved despite starting Decadron.  She has not had any recent falls.  Appetite is fair ? ?ECOG PS- 2 ?Pain scale- 0 ?Opioid associated constipation- no ? ?Review of systems- Review of Systems  ?Constitutional:  Positive for malaise/fatigue. Negative for chills, fever and weight loss.  ?HENT:  Negative for congestion, ear discharge and nosebleeds.   ?Eyes:  Negative for blurred vision.  ?Respiratory:  Negative for cough, hemoptysis, sputum production, shortness of breath and wheezing.   ?Cardiovascular:  Negative for chest pain, palpitations, orthopnea and claudication.  ?Gastrointestinal:  Negative for abdominal pain, blood in stool, constipation, diarrhea, heartburn, melena, nausea and vomiting.  ?Genitourinary:  Negative for dysuria, flank pain, frequency, hematuria and urgency.  ?Musculoskeletal:  Negative for back pain, joint pain and myalgias.  ?Skin:  Negative for rash.  ?Neurological:  Negative for dizziness, tingling, focal weakness, seizures, weakness and headaches.  ?     Blurry vision  ?Endo/Heme/Allergies:  Does not bruise/bleed easily.  ?Psychiatric/Behavioral:  Negative for depression and suicidal ideas. The patient does not have insomnia.    ? ? ? ?Allergies  ?Allergen Reactions  ? Augmentin [Amoxicillin-Pot Clavulanate]   ?  "messes up my blood cells"  ? Codeine Nausea Only  ? Levaquin [Levofloxacin]   ?  Delusions   ? Sulfa Antibiotics Nausea And Vomiting  ? Vancomycin Other (See Comments)  ?  Redman syndome- happened in the hospital at [facility]  ? Tape Rash  ?  Some bandaids cause rash sometimes  ? ? ? ?Past Medical History:  ?Diagnosis Date  ? Anxiety   ?  Breathing problem   ? low breathing function 53%  ? COPD (chronic obstructive pulmonary disease) ([place])   ? EMPHYSEMA  ? Dysrhythmia   ? tachycardia  ? Emphysema of lung ([place])   ? Hemorrhoid   ? History of hiatal hernia   ? SMALL- 2022  ? Hypercholesteremia   ? Hypertension   ? Hypothyroidism   ?  Larynx polyp   ? Lung cancer ([place])   ? Palpitations   ? with anxiety  ? Platelets decreased ([place]) 2020  ? Seasonal allergies   ? Tinnitus   ? Vertigo   ? no episodes in several years  ? Wears dentures   ? partial lower  ? White coat syndrome with hypertension   ? ? ? ?Past Surgical History:  ?Procedure Laterality Date  ? ABDOMINAL HYSTERECTOMY    ? partial  ? COLONOSCOPY WITH PROPOFOL N/A 01/22/2016  ? Procedure: COLONOSCOPY WITH PROPOFOL;  Surgeon: Lucilla Lame, MD;  Location: [facility];  Service: Endoscopy;  Laterality: N/A;  ? COLONOSCOPY WITH PROPOFOL N/A 04/12/2019  ? Procedure: COLONOSCOPY WITH BIOPSY;  Surgeon: Lucilla Lame, MD;  Location: [facility];  Service: Endoscopy;  Laterality: N/A;  pt would like an early appt  ? ct guided lung bx    ? IR IMAGING GUIDED PORT INSERTION  12/18/2020  ? POLYPECTOMY  01/22/2016  ? Procedure: POLYPECTOMY;  Surgeon: Lucilla Lame, MD;  Location: [facility];  Service: Endoscopy;;  ? POLYPECTOMY N/A 04/12/2019  ? Procedure: POLYPECTOMY;  Surgeon: Lucilla Lame, MD;  Location: [facility];  Service: Endoscopy;  Laterality: N/A;  ? VIDEO BRONCHOSCOPY WITH ENDOBRONCHIAL NAVIGATION N/A 11/21/2020  ? Procedure: VIDEO BRONCHOSCOPY WITH ENDOBRONCHIAL NAVIGATION;  Surgeon: Ottie Glazier, MD;  Location: [facility];  Service: Thoracic;  Laterality: N/A;  ? VIDEO BRONCHOSCOPY WITH ENDOBRONCHIAL ULTRASOUND N/A 11/21/2020  ? Procedure: VIDEO BRONCHOSCOPY WITH ENDOBRONCHIAL ULTRASOUND;  Surgeon: Ottie Glazier, MD;  Location: [facility];  Service: Thoracic;  Laterality: N/A;  ? ? ?Social History  ? ?Socioeconomic History  ? Marital status: Married  ?  Spouse name: Not on file  ? Number of children: Not on file  ? Years of education: Not on file  ? Highest education level: Not on file  ?Occupational History  ? Not on file  ?Tobacco Use  ? Smoking status: Former  ?  Packs/day: 0.50  ?  Years: 50.00  ?  Pack years: 25.00  ?  Types: Cigarettes  ?  Quit date: 11/03/2020  ?   Years since quitting: 1.1  ? Smokeless tobacco: Never  ?Vaping Use  ? Vaping Use: Some days  ?Substance and Sexual Activity  ? Alcohol use: No  ? Drug use: Never  ? Sexual activity: Not Currently  ?Other Topics Concern  ? Not on file  ?Social History Narrative  ? Not on file  ? ?Social Determinants of Health  ? ?Financial Resource Strain: Not on file  ?Food Insecurity: Not on file  ?Transportation Needs: Not on file  ?Physical Activity: Not on file  ?Stress: Not on file  ?Social Connections: Not on file  ?Intimate Partner Violence: Not on file  ? ? ?Family History  ?Problem Relation Age of Onset  ? Alzheimer's disease Mother   ? Heart attack Mother   ? High Cholesterol Mother   ? Hypertension Mother   ? Heart block Mother   ? Lung cancer Father   ? Uterine cancer Sister   ? Hypertension Sister   ? Heart block Sister   ?  Anxiety disorder Sister   ? Breast cancer Neg Hx   ? ? ? ?Current Outpatient Medications:  ?  acetaminophen (TYLENOL) 500 MG tablet, Take 500 mg by mouth every 6 (six) hours as needed for mild pain or moderate pain., Disp: , Rfl:  ?  atorvastatin (LIPITOR) 10 MG tablet, Take 1 tablet by mouth daily., Disp: , Rfl:  ?  dexamethasone (DECADRON) 4 MG tablet, Take 2 tablets (8 mg total) by mouth 2 (two) times daily., Disp: 40 tablet, Rfl: 0 ?  ketotifen (ZADITOR) 0.025 % ophthalmic solution, Place 1 drop into both eyes 2 (two) times daily as needed., Disp: , Rfl:  ?  levothyroxine (SYNTHROID, LEVOTHROID) 50 MCG tablet, Take 50 mcg by mouth daily before breakfast., Disp: , Rfl:  ?  loperamide (IMODIUM) 2 MG capsule, Take 2 mg by mouth as needed for diarrhea or loose stools., Disp: , Rfl:  ?  loratadine (CLARITIN) 10 MG tablet, Take 10 mg by mouth daily. Reported on 01/22/2016, Disp: , Rfl:  ?  LORazepam (ATIVAN) 0.5 MG tablet, TAKE 1 TABLET(0.5 MG) BY MOUTH EVERY 6 HOURS AS NEEDED FOR ANXIETY, Disp: 120 tablet, Rfl: 0 ?  losartan-hydrochlorothiazide (HYZAAR) 100-25 MG tablet, Take 1 tablet by mouth daily.,  Disp: , Rfl:  ?  magnesium chloride (SLOW-MAG) 64 MG TBEC SR tablet, Take 1 tablet (64 mg total) by mouth daily., Disp: 30 tablet, Rfl: 0 ?  metoprolol succinate (TOPROL-XL) 50 MG 24 hr tablet, Take 50 mg by m

## 2021-12-30 ENCOUNTER — Encounter: Payer: Self-pay | Admitting: Oncology

## 2021-12-30 ENCOUNTER — Inpatient Hospital Stay: Payer: Medicare Other | Admitting: Occupational Therapy

## 2021-12-31 ENCOUNTER — Inpatient Hospital Stay: Payer: Medicare Other

## 2022-01-05 ENCOUNTER — Telehealth: Payer: Self-pay

## 2022-01-05 NOTE — Telephone Encounter (Signed)
1230 pm. Follow up phone call made to patient as requested by Natalia Leatherwood, NP.  Spoke with spouse Fritz Pickerel who requested that I contact [employer].  Phone call made to daughter Lexine Baton.  No answer and unable to leave a VM.  ?

## 2022-01-07 ENCOUNTER — Inpatient Hospital Stay (HOSPITAL_BASED_OUTPATIENT_CLINIC_OR_DEPARTMENT_OTHER): Payer: Medicare Other | Admitting: Nurse Practitioner

## 2022-01-07 ENCOUNTER — Inpatient Hospital Stay: Payer: Medicare Other

## 2022-01-07 ENCOUNTER — Other Ambulatory Visit: Payer: Self-pay

## 2022-01-07 ENCOUNTER — Ambulatory Visit: Payer: Medicare Other | Admitting: Radiation Oncology

## 2022-01-07 ENCOUNTER — Encounter: Payer: Self-pay | Admitting: *Deleted

## 2022-01-07 VITALS — BP 136/52 | HR 75 | Temp 98.4°F | Resp 16 | Wt 153.2 lb

## 2022-01-07 DIAGNOSIS — C3490 Malignant neoplasm of unspecified part of unspecified bronchus or lung: Secondary | ICD-10-CM

## 2022-01-07 DIAGNOSIS — Z95828 Presence of other vascular implants and grafts: Secondary | ICD-10-CM

## 2022-01-07 DIAGNOSIS — E860 Dehydration: Secondary | ICD-10-CM

## 2022-01-07 DIAGNOSIS — E876 Hypokalemia: Secondary | ICD-10-CM | POA: Diagnosis not present

## 2022-01-07 DIAGNOSIS — Z5111 Encounter for antineoplastic chemotherapy: Secondary | ICD-10-CM | POA: Diagnosis not present

## 2022-01-07 LAB — CBC WITH DIFFERENTIAL/PLATELET
Abs Immature Granulocytes: 0.05 10*3/uL (ref 0.00–0.07)
Basophils Absolute: 0 10*3/uL (ref 0.0–0.1)
Basophils Relative: 0 %
Eosinophils Absolute: 0 10*3/uL (ref 0.0–0.5)
Eosinophils Relative: 0 %
HCT: 25.6 % — ABNORMAL LOW (ref 36.0–46.0)
Hemoglobin: 8.9 g/dL — ABNORMAL LOW (ref 12.0–15.0)
Immature Granulocytes: 1 %
Lymphocytes Relative: 2 %
Lymphs Abs: 0.1 10*3/uL — ABNORMAL LOW (ref 0.7–4.0)
MCH: 32.5 pg (ref 26.0–34.0)
MCHC: 34.8 g/dL (ref 30.0–36.0)
MCV: 93.4 fL (ref 80.0–100.0)
Monocytes Absolute: 0.3 10*3/uL (ref 0.1–1.0)
Monocytes Relative: 6 %
Neutro Abs: 4.5 10*3/uL (ref 1.7–7.7)
Neutrophils Relative %: 91 %
Platelets: 73 10*3/uL — ABNORMAL LOW (ref 150–400)
RBC: 2.74 MIL/uL — ABNORMAL LOW (ref 3.87–5.11)
RDW: 18 % — ABNORMAL HIGH (ref 11.5–15.5)
WBC: 5 10*3/uL (ref 4.0–10.5)
nRBC: 0 % (ref 0.0–0.2)

## 2022-01-07 LAB — BASIC METABOLIC PANEL
Anion gap: 8 (ref 5–15)
BUN: 36 mg/dL — ABNORMAL HIGH (ref 8–23)
CO2: 22 mmol/L (ref 22–32)
Calcium: 8.6 mg/dL — ABNORMAL LOW (ref 8.9–10.3)
Chloride: 95 mmol/L — ABNORMAL LOW (ref 98–111)
Creatinine, Ser: 1.28 mg/dL — ABNORMAL HIGH (ref 0.44–1.00)
GFR, Estimated: 44 mL/min — ABNORMAL LOW (ref >60)
Glucose, Bld: 126 mg/dL — ABNORMAL HIGH (ref 70–99)
Potassium: 3.2 mmol/L — ABNORMAL LOW (ref 3.5–5.1)
Sodium: 125 mmol/L — ABNORMAL LOW (ref 135–145)

## 2022-01-07 LAB — SAMPLE TO BLOOD BANK

## 2022-01-07 MED ORDER — SODIUM CHLORIDE 0.9% FLUSH
10.0000 mL | Freq: Once | INTRAVENOUS | Status: AC
  Administered 2022-01-07: 10 mL via INTRAVENOUS
  Filled 2022-01-07: qty 10

## 2022-01-07 MED ORDER — HEPARIN SOD (PORK) LOCK FLUSH 100 UNIT/ML IV SOLN
500.0000 [IU] | Freq: Once | INTRAVENOUS | Status: AC
  Administered 2022-01-07: 500 [IU] via INTRAVENOUS
  Filled 2022-01-07: qty 5

## 2022-01-07 MED ORDER — SODIUM CHLORIDE 0.9 % IV SOLN
INTRAVENOUS | Status: AC
  Filled 2022-01-07 (×2): qty 250

## 2022-01-07 MED ORDER — POTASSIUM CHLORIDE 20 MEQ/100ML IV SOLN
20.0000 meq | Freq: Once | INTRAVENOUS | Status: AC
  Administered 2022-01-07: 20 meq via INTRAVENOUS

## 2022-01-07 NOTE — Progress Notes (Signed)
Hematology/Oncology Consult Note 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  She underwent CT chest with contrast in March 2022 following an abnormal x-ray which showed a 2.6 cm mass in the left lower lobe.  This was followed by a PET CT scan which showed a 3.0 x 2.0 cm mass in the left lower lobe of the lung with an SUV of 9.7.  No evidence of locoregional adenopathy.  No evidence of intra-abdominal disease with patient was noted to have a hypermetabolic focus in the right iliac bone with an SUV of 11.1 which was suspicious for metastatic disease.  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  Cells were positive for TTF-1, CD56 and chromogranin.  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  Patient completed whole brain radiation treatment and completed carbo etoposide Tecentriq 4 cycles on 03/10/2021.  Disease progression in March 2022 with new bone metastases and pancreatic metastases    Interval history- Beth Mcmillan is a 76 y.o.  female who returns to clinic for evaluation after cycle 3 of lurbinectedin. She had a fall at home after getting out of bed, says her legs 'gave out'. She scraped her temple but otherwise denies pain or injury. Her husband says she's had chronic memory changes since WBRT in July 2022 but memory and forgetfulness is somewhat worse. She has not started brain radiation for recent brain metastases. Husband assists her with her ADLs. She uses a wheelchair for generalized weakness. She is unsure when she starts radiation. Also complains of constipation. Appetite and fluid intake is reduced. Is unsure of medications that she is taking for constipation.   ECOG PS- 3 Pain scale- 0 Opioid associated constipation- no  Review of systems- Review of Systems  Constitutional:  Positive for malaise/fatigue. Negative for chills, fever and weight loss.  HENT:  Negative for congestion, ear discharge and nosebleeds.   Eyes:  Negative for blurred vision.  Respiratory:  Negative for cough, hemoptysis, sputum production, shortness of breath and wheezing.   Cardiovascular:  Negative for chest pain, palpitations, orthopnea and claudication.  Gastrointestinal:  Negative for abdominal pain, blood in stool, constipation, diarrhea, heartburn, melena, nausea and vomiting.  Genitourinary:  Negative for dysuria, flank pain, frequency, hematuria and urgency.  Musculoskeletal:  Positive for falls. Negative for back pain, joint pain and myalgias.  Skin:  Negative for rash.  Neurological:  Positive for weakness. Negative for dizziness, tingling, focal weakness, seizures and headaches.       Blurry vision  Endo/Heme/Allergies:  Does not bruise/bleed easily.  Psychiatric/Behavioral:  Negative for depression and suicidal ideas. The patient does not have  insomnia.       Allergies  Allergen Reactions   Augmentin [Amoxicillin-Pot Clavulanate]     "messes up my blood cells"   Codeine Nausea Only   Levaquin [Levofloxacin]      Delusions    Sulfa Antibiotics Nausea And Vomiting   Vancomycin Other (See Comments)    Redman syndome- happened in the hospital at [facility]   Tape Rash    Some bandaids cause rash sometimes     Past Medical History:  Diagnosis Date   Anxiety    Breathing problem    low breathing function 53%   COPD (chronic obstructive pulmonary disease) (HCC)    EMPHYSEMA   Dysrhythmia    tachycardia   Emphysema of lung (HCC)    Hemorrhoid    History of hiatal hernia    SMALL- 2022   Hypercholesteremia    Hypertension    Hypothyroidism    Larynx polyp    Lung cancer (HCC)    Palpitations    with anxiety   Platelets decreased ([place]) 2020   Seasonal allergies    Tinnitus    Vertigo    no episodes in several years   Wears dentures    partial lower   White coat syndrome with hypertension      Past Surgical History:  Procedure Laterality Date   ABDOMINAL HYSTERECTOMY     partial   COLONOSCOPY WITH PROPOFOL N/A 01/22/2016   Procedure: COLONOSCOPY WITH PROPOFOL;  Surgeon: Lucilla Lame, MD;  Location: [place];  Service: Endoscopy;  Laterality: N/A;   COLONOSCOPY WITH PROPOFOL N/A 04/12/2019   Procedure: COLONOSCOPY WITH BIOPSY;  Surgeon: Lucilla Lame, MD;  Location: [place];  Service: Endoscopy;  Laterality: N/A;  pt would like an early appt   ct guided lung bx     IR IMAGING GUIDED PORT INSERTION  12/18/2020   POLYPECTOMY  01/22/2016   Procedure: POLYPECTOMY;  Surgeon: Lucilla Lame, MD;  Location: [place];  Service: Endoscopy;;   POLYPECTOMY N/A 04/12/2019   Procedure: POLYPECTOMY;  Surgeon: Lucilla Lame, MD;  Location: [place];  Service: Endoscopy;  Laterality: N/A;   VIDEO BRONCHOSCOPY WITH ENDOBRONCHIAL NAVIGATION N/A 11/21/2020   Procedure: VIDEO BRONCHOSCOPY WITH ENDOBRONCHIAL NAVIGATION;  Surgeon: Ottie Glazier, MD;  Location: [facility];  Service: Thoracic;  Laterality: N/A;   VIDEO BRONCHOSCOPY WITH ENDOBRONCHIAL ULTRASOUND N/A 11/21/2020    Procedure: VIDEO BRONCHOSCOPY WITH ENDOBRONCHIAL ULTRASOUND;  Surgeon: Ottie Glazier, MD;  Location: [facility];  Service: Thoracic;  Laterality: N/A;    Social History   Socioeconomic History   Marital status: Married    Spouse name: Not on file   Number of children: Not on file   Years of education: Not on file   Highest education level: Not on file  Occupational History   Not on file  Tobacco Use   Smoking status: Former    Packs/day: 0.50    Years: 50.00    Pack years: 25.00    Types: Cigarettes    Quit date: 11/03/2020    Years since quitting: 1.1   Smokeless tobacco: Never  Vaping Use   Vaping Use: Some days  Substance and Sexual Activity   Alcohol use: No   Drug use: Never   Sexual activity: Not Currently  Other Topics Concern   Not on file  Social History Narrative   Not on file   Social Determinants of Health   Financial Resource Strain: Not on file  Food Insecurity: Not on file  Transportation Needs: Not on file  Physical Activity: Not on file  Stress: Not on file  Social Connections: Not on file  Intimate Partner Violence: Not on file    Family History  Problem Relation Age of Onset   Alzheimer's disease Mother    Heart attack Mother    High Cholesterol Mother    Hypertension Mother    Heart block Mother    Lung cancer Father    Uterine cancer Sister    Hypertension Sister    Heart block Sister    Anxiety disorder Sister    Breast cancer Neg Hx      Current Outpatient Medications:    acetaminophen (TYLENOL) 500 MG tablet, Take 500 mg by mouth every 6 (six) hours as needed for mild pain or moderate pain., Disp: , Rfl:    atorvastatin (LIPITOR) 10 MG tablet, Take 1 tablet by mouth daily., Disp: , Rfl:    dexamethasone (DECADRON) 4 MG tablet, Take 2 tablets (8 mg total) by mouth 2 (two) times daily., Disp: 40 tablet, Rfl: 0   hydrALAZINE (APRESOLINE) 50 MG tablet, Take 50 mg by mouth 2 (two) times daily., Disp: , Rfl:    ketotifen (ZADITOR)  0.025 % ophthalmic solution, Place 1 drop into both eyes 2 (two) times daily as needed., Disp: , Rfl:    levothyroxine (SYNTHROID, LEVOTHROID) 50 MCG tablet, Take 50 mcg by mouth daily before breakfast., Disp: , Rfl:    loperamide (IMODIUM) 2 MG capsule, Take 2 mg by mouth as needed for diarrhea or loose stools., Disp: , Rfl:    loratadine (CLARITIN) 10 MG tablet, Take 10 mg by mouth daily. Reported on 01/22/2016, Disp: , Rfl:    LORazepam (ATIVAN) 0.5 MG tablet, TAKE 1 TABLET(0.5 MG) BY MOUTH EVERY 6 HOURS AS NEEDED FOR ANXIETY, Disp: 120 tablet, Rfl: 0   losartan-hydrochlorothiazide (HYZAAR) 100-25 MG tablet, Take 1 tablet by mouth daily., Disp: , Rfl:    magnesium chloride (SLOW-MAG) 64 MG TBEC SR tablet, Take 1 tablet (64 mg total) by mouth daily., Disp: 30 tablet, Rfl: 0   metoprolol succinate (TOPROL-XL) 50 MG 24 hr tablet, Take 50 mg by mouth daily., Disp: , Rfl:    nystatin (MYCOSTATIN) 100000 UNIT/ML suspension, Take 5 mLs (500,000 Units total) by mouth 4 (four) times daily., Disp: 60 mL, Rfl: 1   ondansetron (ZOFRAN) 8 MG tablet, Take 1 tablet (8 mg total) by mouth 2 (two) times daily as needed for refractory nausea / vomiting. Start on day 3 after chemotherapy. (Patient not taking: Reported on 12/29/2021), Disp: 30 tablet, Rfl: 1   polyethylene glycol (MIRALAX / GLYCOLAX) 17 g packet, Take 17 g by mouth daily as needed for moderate constipation (pt takes it daily for 7 days after each chemo treatment)., Disp: , Rfl:    potassium chloride SA (KLOR-CON M) 20 MEQ tablet, Take 1 tablet (20 mEq total) by mouth daily., Disp: 30 tablet, Rfl: 1   prochlorperazine (COMPAZINE) 10 MG tablet, Take 1 tablet (10 mg total) by mouth every 6 (six) hours as needed (Nausea or vomiting). (Patient not taking: Reported on 12/29/2021), Disp: 30 tablet, Rfl: 1   QUEtiapine (SEROQUEL) 25 MG tablet, Take 1 tablet (25 mg total) by mouth at bedtime., Disp: 30 tablet, Rfl: 2   senna-docusate (SENOKOT-S) 8.6-50 MG tablet, Take  1 tablet by mouth daily as needed for mild constipation (pt takes the tablet 2 a dayfor 5 days after chemotherapy treatment)., Disp: , Rfl:    sodium chloride 1  g tablet, Take 1 g by mouth in the morning, at noon, in the evening, and at bedtime., Disp: , Rfl:    TRELEGY ELLIPTA 100-62.5-25 MCG/ACT AEPB, INHALE 1 PUFF INTO THE LUNGS DAILY, Disp: 60 each, Rfl: 3   VITAMIN MIXTURE PO, Take 1 tablet by mouth in the morning, at noon, and at bedtime. Balance of nature - fruit, Disp: , Rfl:    VITAMIN MIXTURE PO, Take 1 tablet by mouth 3 (three) times daily. 1 tablet by mouth morning, noon, and night Balance of Nature: veggie, Disp: , Rfl:  No current facility-administered medications for this visit.  Facility-Administered Medications Ordered in Other Visits:    0.9 %  sodium chloride infusion, , Intravenous, Once, Earlie Server, MD   heparin lock flush 100 unit/mL, 500 Units, Intravenous, Once, Verlon Au, NP   sodium chloride flush (NS) 0.9 % injection 10 mL, 10 mL, Intravenous, Once, Verlon Au, NP  Physical exam:  Vitals:   01/07/22 0925  BP: (!) 136/52  Pulse: 75  Resp: 16  Temp: 98.4 F (36.9 C)  TempSrc: Tympanic  SpO2: 100%  Weight: 153 lb 3 oz (69.5 kg)   Physical Exam Constitutional:      General: She is not in acute distress.    Comments: Frail appearing. In recliner. Accompanied by husband.   HENT:     Head: Normocephalic.  Cardiovascular:     Rate and Rhythm: Normal rate and regular rhythm.  Pulmonary:     Effort: Pulmonary effort is normal.     Breath sounds: Normal breath sounds.  Abdominal:     General: Bowel sounds are normal. There is no distension.     Palpations: Abdomen is soft.  Skin:    General: Skin is warm and dry.     Findings: Lesion (superficial scrape on right forehead to temple. No bleeding or bruising.) present.  Neurological:     Mental Status: She is alert and oriented to person, place, and time.  Psychiatric:        Mood and Affect: Mood  normal.        Behavior: Behavior is cooperative.        Thought Content: Thought content normal.        Cognition and Memory: She exhibits impaired recent memory.        Latest Ref Rng & Units 01/07/2022    9:04 AM  CMP  Glucose 70 - 99 mg/dL 126    BUN 8 - 23 mg/dL 36    Creatinine 0.44 - 1.00 mg/dL 1.28    Sodium 135 - 145 mmol/L 125    Potassium 3.5 - 5.1 mmol/L 3.2    Chloride 98 - 111 mmol/L 95    CO2 22 - 32 mmol/L 22    Calcium 8.9 - 10.3 mg/dL 8.6        Latest Ref Rng & Units 01/07/2022    9:54 AM  CBC  WBC 4.0 - 10.5 K/uL 5.0    Hemoglobin 12.0 - 15.0 g/dL 8.9    Hematocrit 36.0 - 46.0 % 25.6    Platelets 150 - 400 K/uL 73      No images are attached to the encounter.  MR Brain W Wo Contrast  Result Date: 12/24/2021 CLINICAL DATA:  Small-cell lung cancer EXAM: MRI HEAD WITHOUT AND WITH CONTRAST TECHNIQUE: Multiplanar, multiecho pulse sequences of the brain and surrounding structures were obtained without and with intravenous contrast. CONTRAST:  1mL GADAVIST GADOBUTROL 1 MMOL/ML IV SOLN  COMPARISON:  Brain MRI most recently 08/19/2021 FINDINGS: Brain: There is a new ring-enhancing lesion in the left lentiform nucleus measuring 7 mm x 6 mm. There is a small focus of SWI signal dropout consistent with intralesional blood products. There is a new r 5 mm enhancing lesion in the left inferior frontal gyrus (18-79, 20-17). There is significant vasogenic edema surrounding these lesions in the basal ganglia, internal capsule, and extending into the left frontal operculum A punctate focus of enhancement in the right cerebellar hemisphere with associated punctate SWI signal dropout is unchanged, allowing for differences in technique (black blood space imaging was not performed on the current study). No other enhancing lesions are identified. There is no acute intracranial hemorrhage, extra-axial fluid collection, or acute infarct. Parenchymal volume is within normal limits. The  ventricles are stable in size. Confluent FLAIR signal abnormality throughout the remainder of the subcortical and periventricular white matter is not significantly changed. A few small foci of chronic blood products are again seen bilaterally. There is no midline shift. Vascular: Normal flow voids. Skull and upper cervical spine: Normal marrow signal. Sinuses/Orbits: The paranasal sinuses are clear. The globes and orbits are unremarkable. Other: There are bilateral mastoid effusions, new on the left but unchanged on the right. The imaged nasopharynx is unremarkable. IMPRESSION: 1. Two new subcentimeter metastatic lesions in the left lentiform nucleus and inferior frontal gyrus with significant surrounding edema but no midline shift. 2. Unchanged punctate focus of enhancement in the right cerebellar hemisphere at the site of a previously treated metastasis. Electronically Signed   By: Valetta Mole M.D.   On: 12/24/2021 13:08     Assessment and plan- Patient is a 76 y.o. 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 S/p cycle 3 of lurbindectedin. Tolerating moderately to poorly- malignancy vs chemo? Follow up with Dr. Janese Banks as scheduled.  Symptomatic anemia. Hemoglobin remains > 8 therefore she does not require blood transfusion at this time.  Thrombocytopenia- secondary to chemo. Plt 73. Monitor.  Weakness- secondary to malignancy, treatment, electrolyte abnormalities, and deconditioning. No apparent injury with fall. High risk for additional falls. Recommended assistance with transfers, wheelchair, assistance with ADLs, changing positions slowly.  Hypokalemia- K 3.1. She will receive 40 meq of potassium along with IV fluids.  Confusion- s/p WBRT. Worsening confusion, weakness, vision changes scheduled for 5/24 per Dr. Baruch Gouty.  Goals of care- treatment given with palliative intent. Reviewed that symptoms concerning for generalized decline secondary to malignancy.    Disposition: No blood RTC tomorrow for port/lab (bmp) +/- fluids and potassium- la   Visit Diagnosis 1. Hypokalemia   2. Dehydration   3. Small cell lung cancer in adult ([place])    Beckey Rutter, [place], AGNP-C [place] [facility] [phone] (clinic) 01/07/2022

## 2022-01-07 NOTE — Progress Notes (Signed)
Pt returns for follow-up. She endorses constipation and weakness in her legs. She reports a recent fall while getting out of bed to go to the bathroom. Sustained an abrasion to right cheek and temple.

## 2022-01-08 ENCOUNTER — Inpatient Hospital Stay: Payer: Medicare Other

## 2022-01-08 ENCOUNTER — Other Ambulatory Visit: Payer: Medicare Other

## 2022-01-08 ENCOUNTER — Ambulatory Visit: Payer: Medicare Other

## 2022-01-08 ENCOUNTER — Ambulatory Visit: Payer: Medicare Other | Admitting: Nurse Practitioner

## 2022-01-08 VITALS — BP 157/52 | HR 65 | Temp 98.7°F | Resp 16

## 2022-01-08 DIAGNOSIS — Z5111 Encounter for antineoplastic chemotherapy: Secondary | ICD-10-CM | POA: Diagnosis not present

## 2022-01-08 DIAGNOSIS — E876 Hypokalemia: Secondary | ICD-10-CM

## 2022-01-08 DIAGNOSIS — Z95828 Presence of other vascular implants and grafts: Secondary | ICD-10-CM

## 2022-01-08 LAB — BASIC METABOLIC PANEL
Anion gap: 8 (ref 5–15)
BUN: 35 mg/dL — ABNORMAL HIGH (ref 8–23)
CO2: 21 mmol/L — ABNORMAL LOW (ref 22–32)
Calcium: 8.3 mg/dL — ABNORMAL LOW (ref 8.9–10.3)
Chloride: 96 mmol/L — ABNORMAL LOW (ref 98–111)
Creatinine, Ser: 1.17 mg/dL — ABNORMAL HIGH (ref 0.44–1.00)
GFR, Estimated: 49 mL/min — ABNORMAL LOW (ref >60)
Glucose, Bld: 143 mg/dL — ABNORMAL HIGH (ref 70–99)
Potassium: 3.1 mmol/L — ABNORMAL LOW (ref 3.5–5.1)
Sodium: 125 mmol/L — ABNORMAL LOW (ref 135–145)

## 2022-01-08 MED ORDER — SODIUM CHLORIDE 0.9 % IV SOLN
Freq: Once | INTRAVENOUS | Status: AC
  Filled 2022-01-08: qty 250

## 2022-01-08 MED ORDER — POTASSIUM CHLORIDE 20 MEQ/100ML IV SOLN
20.0000 meq | Freq: Once | INTRAVENOUS | Status: AC
  Administered 2022-01-08: 20 meq via INTRAVENOUS

## 2022-01-08 MED ORDER — HEPARIN SOD (PORK) LOCK FLUSH 100 UNIT/ML IV SOLN
500.0000 [IU] | Freq: Once | INTRAVENOUS | Status: AC
  Administered 2022-01-08: 500 [IU] via INTRAVENOUS
  Filled 2022-01-08: qty 5

## 2022-01-08 MED ORDER — POTASSIUM CHLORIDE IN NACL 40-0.9 MEQ/L-% IV SOLN: Freq: Once | INTRAVENOUS | Status: DC

## 2022-01-08 MED ORDER — POTASSIUM CHLORIDE IN NACL 40-0.9 MEQ/L-% IV SOLN: INTRAVENOUS | Status: DC

## 2022-01-08 NOTE — Progress Notes (Signed)
K 3.1. Per Moishe Spice, RN per Dr. Janese Banks, patient to get 40 mEq of Potassium and 1L IVF over 2 hours.

## 2022-01-09 ENCOUNTER — Encounter: Payer: Self-pay | Admitting: Nurse Practitioner

## 2022-01-12 ENCOUNTER — Telehealth: Payer: Self-pay

## 2022-01-12 ENCOUNTER — Ambulatory Visit: Payer: Medicare Other | Admitting: Oncology

## 2022-01-12 NOTE — Telephone Encounter (Signed)
230 pm.  Phone call made to daughter Beth Mcmillan to obtain clarification on the type of wheelchair that is needed.  No answer.  Message left requesting a call back.

## 2022-01-13 ENCOUNTER — Ambulatory Visit
Admission: RE | Admit: 2022-01-13 | Discharge: 2022-01-13 | Disposition: A | Discharge status: Home / Self Care | Payer: Medicare Other | Source: Ambulatory Visit | Attending: Radiation Oncology | Admitting: Radiation Oncology

## 2022-01-13 DIAGNOSIS — C3490 Malignant neoplasm of unspecified part of unspecified bronchus or lung: Secondary | ICD-10-CM | POA: Diagnosis not present

## 2022-01-13 DIAGNOSIS — C7931 Secondary malignant neoplasm of brain: Secondary | ICD-10-CM | POA: Insufficient documentation

## 2022-01-13 MED ORDER — GADOBUTROL 1 MMOL/ML IV SOLN
7.0000 mL | Freq: Once | INTRAVENOUS | Status: AC | PRN
  Administered 2022-01-13: 7 mL via INTRAVENOUS

## 2022-01-14 ENCOUNTER — Encounter: Payer: Self-pay | Admitting: Nurse Practitioner

## 2022-01-14 ENCOUNTER — Ambulatory Visit
Admission: RE | Admit: 2022-01-14 | Discharge: 2022-01-14 | Disposition: A | Discharge status: Home / Self Care | Payer: Medicare Other | Source: Ambulatory Visit | Attending: Radiation Oncology | Admitting: Radiation Oncology

## 2022-01-14 ENCOUNTER — Ambulatory Visit: Payer: Medicare Other | Admitting: Radiation Oncology

## 2022-01-14 DIAGNOSIS — C3432 Malignant neoplasm of lower lobe, left bronchus or lung: Secondary | ICD-10-CM | POA: Diagnosis present

## 2022-01-14 DIAGNOSIS — Z510 Encounter for antineoplastic radiation therapy: Secondary | ICD-10-CM | POA: Insufficient documentation

## 2022-01-15 MED FILL — Dexamethasone Sodium Phosphate Inj 100 MG/10ML: INTRAMUSCULAR | Qty: 1 | Status: AC

## 2022-01-16 ENCOUNTER — Encounter: Payer: Self-pay | Admitting: Oncology

## 2022-01-16 ENCOUNTER — Other Ambulatory Visit: Payer: Self-pay | Admitting: Oncology

## 2022-01-19 ENCOUNTER — Encounter: Payer: Self-pay | Admitting: Oncology

## 2022-01-19 ENCOUNTER — Inpatient Hospital Stay: Payer: Medicare Other

## 2022-01-19 ENCOUNTER — Other Ambulatory Visit: Payer: Self-pay | Admitting: *Deleted

## 2022-01-19 ENCOUNTER — Other Ambulatory Visit: Payer: Self-pay

## 2022-01-19 ENCOUNTER — Inpatient Hospital Stay (HOSPITAL_BASED_OUTPATIENT_CLINIC_OR_DEPARTMENT_OTHER): Payer: Medicare Other | Admitting: Oncology

## 2022-01-19 ENCOUNTER — Inpatient Hospital Stay (HOSPITAL_BASED_OUTPATIENT_CLINIC_OR_DEPARTMENT_OTHER): Payer: Medicare Other | Admitting: Hospice and Palliative Medicine

## 2022-01-19 VITALS — BP 120/66 | HR 90 | Temp 98.1°F | Resp 16 | Wt 151.0 lb

## 2022-01-19 DIAGNOSIS — Z515 Encounter for palliative care: Secondary | ICD-10-CM

## 2022-01-19 DIAGNOSIS — T451X5A Adverse effect of antineoplastic and immunosuppressive drugs, initial encounter: Secondary | ICD-10-CM

## 2022-01-19 DIAGNOSIS — C3490 Malignant neoplasm of unspecified part of unspecified bronchus or lung: Secondary | ICD-10-CM

## 2022-01-19 DIAGNOSIS — C3432 Malignant neoplasm of lower lobe, left bronchus or lung: Secondary | ICD-10-CM | POA: Diagnosis not present

## 2022-01-19 DIAGNOSIS — C7931 Secondary malignant neoplasm of brain: Secondary | ICD-10-CM

## 2022-01-19 DIAGNOSIS — D6959 Other secondary thrombocytopenia: Secondary | ICD-10-CM | POA: Diagnosis not present

## 2022-01-19 DIAGNOSIS — Z510 Encounter for antineoplastic radiation therapy: Secondary | ICD-10-CM | POA: Diagnosis not present

## 2022-01-19 DIAGNOSIS — Z7189 Other specified counseling: Secondary | ICD-10-CM

## 2022-01-19 DIAGNOSIS — Z5111 Encounter for antineoplastic chemotherapy: Secondary | ICD-10-CM | POA: Diagnosis not present

## 2022-01-19 LAB — CBC WITH DIFFERENTIAL/PLATELET
Abs Immature Granulocytes: 0.92 10*3/uL — ABNORMAL HIGH (ref 0.00–0.07)
Basophils Absolute: 0.1 10*3/uL (ref 0.0–0.1)
Basophils Relative: 1 %
Eosinophils Absolute: 0 10*3/uL (ref 0.0–0.5)
Eosinophils Relative: 0 %
HCT: 27.7 % — ABNORMAL LOW (ref 36.0–46.0)
Hemoglobin: 9.6 g/dL — ABNORMAL LOW (ref 12.0–15.0)
Immature Granulocytes: 8 %
Lymphocytes Relative: 7 %
Lymphs Abs: 0.8 10*3/uL (ref 0.7–4.0)
MCH: 32.8 pg (ref 26.0–34.0)
MCHC: 34.7 g/dL (ref 30.0–36.0)
MCV: 94.5 fL (ref 80.0–100.0)
Monocytes Absolute: 0.8 10*3/uL (ref 0.1–1.0)
Monocytes Relative: 7 %
Neutro Abs: 8.5 10*3/uL — ABNORMAL HIGH (ref 1.7–7.7)
Neutrophils Relative %: 77 %
Platelets: 68 10*3/uL — ABNORMAL LOW (ref 150–400)
RBC: 2.93 MIL/uL — ABNORMAL LOW (ref 3.87–5.11)
RDW: 18.5 % — ABNORMAL HIGH (ref 11.5–15.5)
Smear Review: NORMAL
WBC: 11 10*3/uL — ABNORMAL HIGH (ref 4.0–10.5)
nRBC: 0 % (ref 0.0–0.2)

## 2022-01-19 LAB — COMPREHENSIVE METABOLIC PANEL
ALT: 49 U/L — ABNORMAL HIGH (ref 0–44)
AST: 23 U/L (ref 15–41)
Albumin: 3.1 g/dL — ABNORMAL LOW (ref 3.5–5.0)
Alkaline Phosphatase: 61 U/L (ref 38–126)
Anion gap: 5 (ref 5–15)
BUN: 30 mg/dL — ABNORMAL HIGH (ref 8–23)
CO2: 22 mmol/L (ref 22–32)
Calcium: 8.6 mg/dL — ABNORMAL LOW (ref 8.9–10.3)
Chloride: 101 mmol/L (ref 98–111)
Creatinine, Ser: 1.15 mg/dL — ABNORMAL HIGH (ref 0.44–1.00)
GFR, Estimated: 50 mL/min — ABNORMAL LOW (ref >60)
Glucose, Bld: 115 mg/dL — ABNORMAL HIGH (ref 70–99)
Potassium: 3.1 mmol/L — ABNORMAL LOW (ref 3.5–5.1)
Sodium: 128 mmol/L — ABNORMAL LOW (ref 135–145)
Total Bilirubin: 0.6 mg/dL (ref 0.3–1.2)
Total Protein: 5.8 g/dL — ABNORMAL LOW (ref 6.5–8.1)

## 2022-01-19 MED ORDER — DEXAMETHASONE 4 MG PO TABS: 4.0000 mg | ORAL_TABLET | Freq: Two times a day (BID) | ORAL | 0 refills | Status: DC

## 2022-01-19 MED ORDER — SODIUM CHLORIDE 0.9% FLUSH
10.0000 mL | Freq: Once | INTRAVENOUS | Status: AC
  Administered 2022-01-19: 10 mL via INTRAVENOUS
  Filled 2022-01-19: qty 10

## 2022-01-19 NOTE — Progress Notes (Signed)
Daughter states pt seems weaker; downhill for the past two weeks.

## 2022-01-19 NOTE — Progress Notes (Signed)
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  She underwent CT chest with contrast in March 2022 following an abnormal x-ray which showed a 2.6 cm mass in the left lower lobe.  This was followed by a PET CT scan which showed a 3.0 x 2.0 cm mass in the left lower lobe of the lung with an SUV of 9.7.  No evidence of locoregional adenopathy.  No evidence of intra-abdominal disease with patient was noted to have a hypermetabolic focus in the right iliac bone with an SUV of 11.1 which was suspicious for metastatic disease.  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  Cells were positive for TTF-1, CD56 and chromogranin.  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  Patient completed whole brain radiation treatment and completed carbo etoposide Tecentriq 4 cycles on 03/10/2021.  Disease progression in March 2022 with new bone metastases and pancreatic metastases    Interval history-patient is here with her  daughter today.  Daughter reports that patient is getting confused increasingly.  She had another MRI brain by Dr. Baruch Gouty which showed new areas of brain metastases.  She is scheduled to start whole brain radiation treatment tomorrow.  No recent falls.  Appetite is fair.  Generally patient feels weak and thinks that her legs give way often.  ECOG PS- 2-3 Pain scale- 0 Opioid associated constipation-no  Review of systems- Review of Systems  Constitutional:  Positive for malaise/fatigue.     Allergies  Allergen Reactions   Augmentin [Amoxicillin-Pot Clavulanate]     "messes up my blood cells"   Codeine Nausea Only   Levaquin [Levofloxacin]     Delusions    Sulfa Antibiotics Nausea And Vomiting   Vancomycin Other (See Comments)    Redman syndome- happened in the hospital at [facility]   Tape Rash    Some bandaids cause rash sometimes     Past Medical History:  Diagnosis Date   Anxiety    Breathing problem    low breathing function 53%   COPD (chronic obstructive pulmonary disease) (HCC)    EMPHYSEMA   Dysrhythmia    tachycardia   Emphysema of lung (HCC)    Hemorrhoid    History of hiatal hernia    SMALL- 2022   Hypercholesteremia    Hypertension    Hypothyroidism    Larynx polyp    Lung cancer (HCC)    Palpitations    with anxiety   Platelets decreased (HCC) 2020   Seasonal allergies    Tinnitus    Vertigo    no episodes in  several years   Wears dentures    partial lower   White coat syndrome with hypertension      Past Surgical History:  Procedure Laterality Date   ABDOMINAL HYSTERECTOMY     partial   COLONOSCOPY WITH PROPOFOL N/A 01/22/2016   Procedure: COLONOSCOPY WITH PROPOFOL;  Surgeon: Lucilla Lame, MD;  Location: [place];  Service: Endoscopy;  Laterality: N/A;   COLONOSCOPY WITH PROPOFOL N/A 04/12/2019   Procedure: COLONOSCOPY WITH BIOPSY;  Surgeon: Lucilla Lame, MD;  Location: [place];  Service: Endoscopy;  Laterality: N/A;  pt would  like an early appt   ct guided lung bx     IR IMAGING GUIDED PORT INSERTION  12/18/2020   POLYPECTOMY  01/22/2016   Procedure: POLYPECTOMY;  Surgeon: Lucilla Lame, MD;  Location: [place];  Service: Endoscopy;;   POLYPECTOMY N/A 04/12/2019   Procedure: POLYPECTOMY;  Surgeon: Lucilla Lame, MD;  Location: [place];  Service: Endoscopy;  Laterality: N/A;   VIDEO BRONCHOSCOPY WITH ENDOBRONCHIAL NAVIGATION N/A 11/21/2020   Procedure: VIDEO BRONCHOSCOPY WITH ENDOBRONCHIAL NAVIGATION;  Surgeon: Ottie Glazier, MD;  Location: [facility];  Service: Thoracic;  Laterality: N/A;   VIDEO BRONCHOSCOPY WITH ENDOBRONCHIAL ULTRASOUND N/A 11/21/2020   Procedure: VIDEO BRONCHOSCOPY WITH ENDOBRONCHIAL ULTRASOUND;  Surgeon: Ottie Glazier, MD;  Location: [facility];  Service: Thoracic;  Laterality: N/A;    Social History   Socioeconomic History   Marital status: Married    Spouse name: Not on file   Number of children: Not on file   Years of education: Not on file   Highest education level: Not on file  Occupational History   Not on file  Tobacco Use   Smoking status: Former    Packs/day: 0.50    Years: 50.00    Pack years: 25.00    Types: Cigarettes    Quit date: 11/03/2020    Years since quitting: 1.2   Smokeless tobacco: Never  Vaping Use   Vaping Use: Some days  Substance and Sexual Activity   Alcohol use: No   Drug use: Never   Sexual activity: Not Currently  Other Topics Concern   Not on file  Social History Narrative   Not on file   Social Determinants of Health   Financial Resource Strain: Not on file  Food Insecurity: Not on file  Transportation Needs: Not on file  Physical Activity: Not on file  Stress: Not on file  Social Connections: Not on file  Intimate Partner Violence: Not on file    Family History  Problem Relation Age of Onset   Alzheimer's disease Mother    Heart attack Mother    High Cholesterol Mother    Hypertension Mother    Heart block Mother     Lung cancer Father    Uterine cancer Sister    Hypertension Sister    Heart block Sister    Anxiety disorder Sister    Breast cancer Neg Hx      Current Outpatient Medications:    acetaminophen (TYLENOL) 500 MG tablet, Take 500 mg by mouth every 6 (six) hours as needed for mild pain or moderate pain., Disp: , Rfl:    atorvastatin (LIPITOR) 10 MG tablet, Take 1 tablet by mouth daily., Disp: , Rfl:    hydrALAZINE (APRESOLINE) 50 MG tablet, Take 50 mg by mouth 2 (two) times daily., Disp: , Rfl:    ketotifen (ZADITOR) 0.025 % ophthalmic solution, Place 1 drop into both eyes 2 (two) times daily  as needed., Disp: , Rfl:    levothyroxine (SYNTHROID, LEVOTHROID) 50 MCG tablet, Take 50 mcg by mouth daily before breakfast., Disp: , Rfl:    loperamide (IMODIUM) 2 MG capsule, Take 2 mg by mouth as needed for diarrhea or loose stools., Disp: , Rfl:    loratadine (CLARITIN) 10 MG tablet, Take 10 mg by mouth daily. Reported on 01/22/2016, Disp: , Rfl:    LORazepam (ATIVAN) 0.5 MG tablet, TAKE 1 TABLET(0.5 MG) BY MOUTH EVERY 6 HOURS AS NEEDED FOR ANXIETY, Disp: 120 tablet, Rfl: 0   losartan-hydrochlorothiazide (HYZAAR) 100-25 MG tablet, Take 1 tablet by mouth daily., Disp: , Rfl:    magnesium chloride (SLOW-MAG) 64 MG TBEC SR tablet, Take 1 tablet (64 mg total) by mouth daily., Disp: 30 tablet, Rfl: 0   metoprolol succinate (TOPROL-XL) 50 MG 24 hr tablet, Take 50 mg by mouth daily., Disp: , Rfl:    nystatin (MYCOSTATIN) 100000 UNIT/ML suspension, Take 5 mLs (500,000 Units total) by mouth 4 (four) times daily., Disp: 60 mL, Rfl: 1   polyethylene glycol (MIRALAX / GLYCOLAX) 17 g packet, Take 17 g by mouth daily as needed for moderate constipation (pt takes it daily for 7 days after each chemo treatment)., Disp: , Rfl:    potassium chloride SA (KLOR-CON M) 20 MEQ tablet, Take 1 tablet (20 mEq total) by mouth daily., Disp: 30 tablet, Rfl: 1   QUEtiapine (SEROQUEL) 25 MG tablet, Take 1 tablet (25 mg total) by  mouth at bedtime., Disp: 30 tablet, Rfl: 2   senna-docusate (SENOKOT-S) 8.6-50 MG tablet, Take 1 tablet by mouth daily as needed for mild constipation (pt takes the tablet 2 a dayfor 5 days after chemotherapy treatment)., Disp: , Rfl:    sodium chloride 1 g tablet, Take 1 g by mouth in the morning, at noon, in the evening, and at bedtime., Disp: , Rfl:    TRELEGY ELLIPTA 100-62.5-25 MCG/ACT AEPB, INHALE 1 PUFF INTO THE LUNGS DAILY, Disp: 60 each, Rfl: 3   VITAMIN MIXTURE PO, Take 1 tablet by mouth in the morning, at noon, and at bedtime. Balance of nature - fruit, Disp: , Rfl:    VITAMIN MIXTURE PO, Take 1 tablet by mouth 3 (three) times daily. 1 tablet by mouth morning, noon, and night Balance of Nature: veggie, Disp: , Rfl:    dexamethasone (DECADRON) 4 MG tablet, Take 1 tablet (4 mg total) by mouth 2 (two) times daily with a meal., Disp: 40 tablet, Rfl: 0   ondansetron (ZOFRAN) 8 MG tablet, Take 1 tablet (8 mg total) by mouth 2 (two) times daily as needed for refractory nausea / vomiting. Start on day 3 after chemotherapy. (Patient not taking: Reported on 12/29/2021), Disp: 30 tablet, Rfl: 1   prochlorperazine (COMPAZINE) 10 MG tablet, Take 1 tablet (10 mg total) by mouth every 6 (six) hours as needed (Nausea or vomiting). (Patient not taking: Reported on 12/29/2021), Disp: 30 tablet, Rfl: 1 No current facility-administered medications for this visit.  Facility-Administered Medications Ordered in Other Visits:    0.9 %  sodium chloride infusion, , Intravenous, Once, Earlie Server, MD  Physical exam:  Vitals:   01/19/22 1010  BP: 120/66  Pulse: 90  Resp: 16  Temp: 98.1 F (36.7 C)  SpO2: 98%  Weight: 151 lb (68.5 kg)   Physical Exam Constitutional:      Comments: Appears frail.  She is sitting in a wheelchair  Eyes:     Pupils: Pupils are equal, round, and reactive to  light.  Cardiovascular:     Rate and Rhythm: Normal rate and regular rhythm.     Heart sounds: Normal heart sounds.   Pulmonary:     Effort: Pulmonary effort is normal.     Breath sounds: Normal breath sounds.  Abdominal:     General: Bowel sounds are normal.     Palpations: Abdomen is soft.  Skin:    General: Skin is warm and dry.  Neurological:     Mental Status: She is alert and oriented to person, place, and time.        Latest Ref Rng & Units 01/19/2022    9:19 AM  CMP  Glucose 70 - 99 mg/dL 115    BUN 8 - 23 mg/dL 30    Creatinine 0.44 - 1.00 mg/dL 1.15    Sodium 135 - 145 mmol/L 128    Potassium 3.5 - 5.1 mmol/L 3.1    Chloride 98 - 111 mmol/L 101    CO2 22 - 32 mmol/L 22    Calcium 8.9 - 10.3 mg/dL 8.6    Total Protein 6.5 - 8.1 g/dL 5.8    Total Bilirubin 0.3 - 1.2 mg/dL 0.6    Alkaline Phos 38 - 126 U/L 61    AST 15 - 41 U/L 23    ALT 0 - 44 U/L 49        Latest Ref Rng & Units 01/19/2022    9:19 AM  CBC  WBC 4.0 - 10.5 K/uL 11.0    Hemoglobin 12.0 - 15.0 g/dL 9.6    Hematocrit 36.0 - 46.0 % 27.7    Platelets 150 - 400 K/uL 68      No images are attached to the encounter.  MR Brain W Wo Contrast  Result Date: 01/13/2022 CLINICAL DATA:  Small-cell lung cancer with brain metastases SRS planning EXAM: MRI HEAD WITHOUT AND WITH CONTRAST TECHNIQUE: Multiplanar, multiecho pulse sequences of the brain and surrounding structures were obtained without and with intravenous contrast. CONTRAST:  69mL GADAVIST GADOBUTROL 1 MMOL/ML IV SOLN COMPARISON:  12/24/2021 FINDINGS: Brain: Redemonstrated peripherally enhancing lesion in the left basal ganglia, which measures up to 9 x 7 x 7 mm (series 1033, image 149 and series 15, image 24), previously 6 x 6 x 6 mm when remeasured similarly. This lesion is associated with hemosiderin deposition, likely central hemorrhage. Left inferior frontal gyrus lesion measures 5 x 5 x 4 mm (series 1033, image 141 and series 15, image 32), unchanged. These lesions are associated with mildly increased T2 hyperintense signal. Previously noted focus of enhancement in  the right cerebellum is no longer seen, likely treatment effect. New small foci of enhancement are noted in the inferior left frontal lobe (series 1033, image 149), left thalamus (series 1033, image 144), and left hippocampus (series 1033, image 140). Possible faint enhancing focus in the posterior left frontal lobe (series 1033, image 195) and in the right inferior pons (series 1033, image 109 and series 1034, image 104). No restricted diffusion to suggest acute or subacute infarct. No acute hemorrhage, mass effect, or midline shift. No hydrocephalus or extra-axial collection. Vascular: Patent arterial flow voids. Skull and upper cervical spine: Normal marrow signal. Small T1 and T2 hyperintense lesions in the calvarium, consistent with benign hemangiomas. Sinuses/Orbits: No acute finding. Other: Fluid in the bilateral mastoid air cells. IMPRESSION: 1. New small foci of enhancement in the inferior left frontal lobe, left thalamus and left hippocampus, with additional possible lesions in the posterior left frontal  lobe and right inferior pons, concerning for additional sites of metastatic disease. 2. Redemonstrated previously noted lesions in the left basal ganglia and left inferior frontal gyrus, with slight increase in the size of the left basal ganglia lesion. 3. Previously noted enhancing focus in the right cerebellum is no longer seen, likely treatment effect. Electronically Signed   By: Merilyn Baba M.D.   On: 01/13/2022 21:03   MR Brain W Wo Contrast  Result Date: 12/24/2021 CLINICAL DATA:  Small-cell lung cancer EXAM: MRI HEAD WITHOUT AND WITH CONTRAST TECHNIQUE: Multiplanar, multiecho pulse sequences of the brain and surrounding structures were obtained without and with intravenous contrast. CONTRAST:  33mL GADAVIST GADOBUTROL 1 MMOL/ML IV SOLN COMPARISON:  Brain MRI most recently 08/19/2021 FINDINGS: Brain: There is a new ring-enhancing lesion in the left lentiform nucleus measuring 7 mm x 6 mm. There  is a small focus of SWI signal dropout consistent with intralesional blood products. There is a new r 5 mm enhancing lesion in the left inferior frontal gyrus (18-79, 20-17). There is significant vasogenic edema surrounding these lesions in the basal ganglia, internal capsule, and extending into the left frontal operculum A punctate focus of enhancement in the right cerebellar hemisphere with associated punctate SWI signal dropout is unchanged, allowing for differences in technique (black blood space imaging was not performed on the current study). No other enhancing lesions are identified. There is no acute intracranial hemorrhage, extra-axial fluid collection, or acute infarct. Parenchymal volume is within normal limits. The ventricles are stable in size. Confluent FLAIR signal abnormality throughout the remainder of the subcortical and periventricular white matter is not significantly changed. A few small foci of chronic blood products are again seen bilaterally. There is no midline shift. Vascular: Normal flow voids. Skull and upper cervical spine: Normal marrow signal. Sinuses/Orbits: The paranasal sinuses are clear. The globes and orbits are unremarkable. Other: There are bilateral mastoid effusions, new on the left but unchanged on the right. The imaged nasopharynx is unremarkable. IMPRESSION: 1. Two new subcentimeter metastatic lesions in the left lentiform nucleus and inferior frontal gyrus with significant surrounding edema but no midline shift. 2. Unchanged punctate focus of enhancement in the right cerebellar hemisphere at the site of a previously treated metastasis. Electronically Signed   By: Valetta Mole M.D.   On: 12/24/2021 13:08     Assessment and plan- Patient is a 76 y.o. female with extensive stage small cell lung cancer with bone, brain and possible pancreatic metastases.  She is here for on treatment assessment prior to cycle 4 of lurbinectedin  Patient last received chemotherapy about 3  weeks ago.  However her platelet counts are 68 today and therefore she cannot receive her fourth cycle today.  I suspect this is chemo induced thrombocytopenia which is taking longer to recover as she has been heavily pretreated.  She is also starting another round of whole brain radiation treatment tomorrow.  In the past she has tolerated whole brain radiation poorly.  My plan is to keep her chemotherapy on hold until she completes whole brain radiation.  She is scheduled for repeat surveillance scans next week.  I discussed with her that overall her performance status is decreasing.  She is having more cytopenia as well as harder time tolerating chemotherapy.  If she has disease progression on scans I am doubtful that she would be able to tolerate topotecan.  We discussed based on what CT scan shows we should consider best supportive care/hospice.  I also encouraged  her to think about her CODE STATUS.  She is presently a full code.  She is meeting with palliative care today as well.  She will follow-up with NP Altha Harm next week as well Follow-up with me to be decided based on CT scans and further goals of care.  I am renewing her steroids 4 mg twice daily today.   Visit Diagnosis 1. Goals of care, counseling/discussion   2. Chemotherapy-induced thrombocytopenia   3. Metastatic cancer to brain [facility])      Dr. Randa Evens, MD, MPH [facility] [phone] 01/19/2022 5:16 PM

## 2022-01-19 NOTE — Progress Notes (Signed)
dexa

## 2022-01-19 NOTE — Progress Notes (Signed)
Per MD no chemo treatment today

## 2022-01-19 NOTE — Progress Notes (Signed)
[place]  [phone] [phone]   Name: Anicka Stuckert Date: 01/19/2022 MRN: [id number]  DOB: 23-Oct-1945  Patient Care Team: Idelle Crouch, MD as PCP - General (Internal Medicine) Telford Nab, RN as Oncology Nurse Navigator Sindy Guadeloupe, MD as Attending Physician (Hematology)    REASON FOR CONSULTATION: Beth Mcmillan is a 76 y.o. 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  Patient was most recently on lurbinectedin.  She has had declining performance status.  MRI of the brain revealed new metastatic lesions with plan to repeat whole brain radiation.  Patient was referred to palliative care to help address goals and manage ongoing symptoms.  SOCIAL HISTORY:     reports that she quit smoking about 14 months ago. Her smoking use included cigarettes. She has a 25.00 pack-year smoking history. She has never used smokeless tobacco. She reports that she does not drink alcohol and does not use drugs.  Patient lives at home with her husband.  She has a son and daughter who are involved in her care.  Patient previously worked as a Network engineer at [employer] and then later [employer].  ADVANCE DIRECTIVES:  Not on file  CODE STATUS:   PAST MEDICAL HISTORY: Past Medical History:  Diagnosis Date   Anxiety    Breathing problem    low breathing function 53%   COPD (chronic obstructive pulmonary disease) (HCC)    EMPHYSEMA   Dysrhythmia    tachycardia   Emphysema of lung (HCC)    Hemorrhoid    History of hiatal hernia    SMALL- 2022   Hypercholesteremia    Hypertension    Hypothyroidism    Larynx polyp    Lung cancer (HCC)    Palpitations    with anxiety   Platelets decreased ([place]) 2020   Seasonal allergies     Tinnitus    Vertigo    no episodes in several years   Wears dentures    partial lower   White coat syndrome with hypertension     PAST SURGICAL HISTORY:  Past Surgical History:  Procedure Laterality Date   ABDOMINAL HYSTERECTOMY     partial   COLONOSCOPY WITH PROPOFOL N/A 01/22/2016   Procedure: COLONOSCOPY WITH PROPOFOL;  Surgeon: Lucilla Lame, MD;  Location: [place];  Service: Endoscopy;  Laterality: N/A;   COLONOSCOPY WITH PROPOFOL N/A 04/12/2019   Procedure: COLONOSCOPY WITH BIOPSY;  Surgeon: Lucilla Lame, MD;  Location: [place];  Service: Endoscopy;  Laterality: N/A;  pt would like an early appt   ct guided lung bx     IR IMAGING GUIDED PORT INSERTION  12/18/2020   POLYPECTOMY  01/22/2016   Procedure: POLYPECTOMY;  Surgeon: Lucilla Lame, MD;  Location: [place];  Service: Endoscopy;;   POLYPECTOMY N/A 04/12/2019   Procedure: POLYPECTOMY;  Surgeon: Lucilla Lame, MD;  Location: [place];  Service: Endoscopy;  Laterality: N/A;   VIDEO BRONCHOSCOPY WITH ENDOBRONCHIAL NAVIGATION N/A 11/21/2020   Procedure: VIDEO BRONCHOSCOPY WITH ENDOBRONCHIAL NAVIGATION;  Surgeon: Ottie Glazier, MD;  Location: [facility];  Service: Thoracic;  Laterality: N/A;   VIDEO BRONCHOSCOPY WITH ENDOBRONCHIAL ULTRASOUND N/A 11/21/2020   Procedure: VIDEO BRONCHOSCOPY WITH ENDOBRONCHIAL ULTRASOUND;  Surgeon: Ottie Glazier, MD;  Location: [facility];  Service: Thoracic;  Laterality: N/A;    HEMATOLOGY/ONCOLOGY HISTORY:  Oncology History  Small cell lung cancer in adult [facility])  12/13/2020 Initial Diagnosis   Small cell lung cancer in adult [facility])   12/22/2020 - 10/27/2021 Chemotherapy   Patient is on Treatment Plan : LUNG SCLC Carboplatin + Etoposide + Atezolizumab Induction q21d / Atezolizumab Maintenance q21d     12/22/2020 Cancer Staging   Staging form: Lung, AJCC 8th Edition - Clinical stage from 12/22/2020: Stage IV (cT2, cN0, cM1) - Signed by Sindy Guadeloupe, MD on 12/22/2020    11/17/2021 -  Chemotherapy   Patient is on Treatment Plan : LUNG SMALL CELL Lurbinectedin q21d       ALLERGIES:  is allergic to augmentin [amoxicillin-pot clavulanate], codeine, levaquin [levofloxacin], sulfa antibiotics, vancomycin, and tape.  MEDICATIONS:  Current Outpatient Medications  Medication Sig Dispense Refill   acetaminophen (TYLENOL) 500 MG tablet Take 500 mg by mouth every 6 (six) hours as needed for mild pain or moderate pain.     atorvastatin (LIPITOR) 10 MG tablet Take 1 tablet by mouth daily.     dexamethasone (DECADRON) 4 MG tablet TAKE 2 TABLETS(8 MG) BY MOUTH DAILY. START THE DAY AFTER CHEMOTHERAPY FOR 2 DAYS. TAKE WITH FOOD 30 tablet 2   hydrALAZINE (APRESOLINE) 50 MG tablet Take 50 mg by mouth 2 (two) times daily.     ketotifen (ZADITOR) 0.025 % ophthalmic solution Place 1 drop into both eyes 2 (two) times daily as needed.     levothyroxine (SYNTHROID, LEVOTHROID) 50 MCG tablet Take 50 mcg by mouth daily before breakfast.     loperamide (IMODIUM) 2 MG capsule Take 2 mg by mouth as needed for diarrhea or loose stools.     loratadine (CLARITIN) 10 MG tablet Take 10 mg by mouth daily. Reported on 01/22/2016     LORazepam (ATIVAN) 0.5 MG tablet TAKE 1 TABLET(0.5 MG) BY MOUTH EVERY 6 HOURS AS NEEDED FOR ANXIETY 120 tablet 0   losartan-hydrochlorothiazide (HYZAAR) 100-25 MG tablet Take 1 tablet by mouth daily.     magnesium chloride (SLOW-MAG) 64 MG TBEC SR tablet Take 1 tablet (64 mg total) by mouth daily. 30 tablet 0   metoprolol succinate (TOPROL-XL) 50 MG 24 hr tablet Take 50 mg by mouth daily.     nystatin (MYCOSTATIN) 100000 UNIT/ML suspension Take 5 mLs (500,000 Units total) by mouth 4 (four) times daily. 60 mL 1   ondansetron (ZOFRAN) 8 MG tablet Take 1 tablet (8 mg total) by mouth 2 (two) times daily as needed for refractory nausea / vomiting. Start on day 3 after chemotherapy. (Patient not taking: Reported on 12/29/2021) 30 tablet 1   polyethylene glycol (MIRALAX /  GLYCOLAX) 17 g packet Take 17 g by mouth daily as needed for moderate constipation (pt takes it daily for 7 days after each chemo treatment).     potassium chloride SA (KLOR-CON M) 20 MEQ tablet Take 1 tablet (20 mEq total) by mouth daily. 30 tablet 1   prochlorperazine (COMPAZINE) 10 MG tablet Take 1 tablet (10 mg total) by mouth every 6 (six) hours as needed (Nausea or vomiting). (Patient not taking: Reported on 12/29/2021) 30 tablet 1   QUEtiapine (SEROQUEL) 25 MG tablet Take 1 tablet (25 mg total) by mouth at bedtime. 30 tablet 2   senna-docusate (SENOKOT-S) 8.6-50 MG tablet Take 1 tablet by mouth daily as needed for mild constipation (pt takes the tablet 2 a dayfor 5 days after chemotherapy treatment).     sodium chloride 1 g tablet Take 1 g by mouth in the  morning, at noon, in the evening, and at bedtime.     TRELEGY ELLIPTA 100-62.5-25 MCG/ACT AEPB INHALE 1 PUFF INTO THE LUNGS DAILY 60 each 3   VITAMIN MIXTURE PO Take 1 tablet by mouth in the morning, at noon, and at bedtime. Balance of nature - fruit     VITAMIN MIXTURE PO Take 1 tablet by mouth 3 (three) times daily. 1 tablet by mouth morning, noon, and night Balance of Nature: veggie     No current facility-administered medications for this visit.   Facility-Administered Medications Ordered in Other Visits  Medication Dose Route Frequency Provider Last Rate Last Admin   0.9 %  sodium chloride infusion   Intravenous Once Earlie Server, MD        VITAL SIGNS: There were no vitals taken for this visit. There were no vitals filed for this visit.  Estimated body mass index is 23.65 kg/m as calculated from the following:   Height as of 12/08/21: 5\' 7"  (1.702 m).   Weight as of an earlier encounter on 01/19/22: 151 lb (68.5 kg).  LABS: CBC:    Component Value Date/Time   WBC 11.0 (H) 01/19/2022 0919   HGB 9.6 (L) 01/19/2022 0919   HGB 13.8 06/05/2013 1127   HCT 27.7 (L) 01/19/2022 0919   HCT 40.0 06/05/2013 1127   PLT 68 (L) 01/19/2022  0919   PLT 303 06/05/2013 1127   MCV 94.5 01/19/2022 0919   MCV 92 06/05/2013 1127   NEUTROABS 8.5 (H) 01/19/2022 0919   LYMPHSABS 0.8 01/19/2022 0919   MONOABS 0.8 01/19/2022 0919   EOSABS 0.0 01/19/2022 0919   BASOSABS 0.1 01/19/2022 0919   Comprehensive Metabolic Panel:    Component Value Date/Time   NA 128 (L) 01/19/2022 0919   NA 131 (L) 06/05/2013 1127   K 3.1 (L) 01/19/2022 0919   K 3.0 (L) 06/05/2013 1127   CL 101 01/19/2022 0919   CL 96 (L) 06/05/2013 1127   CO2 22 01/19/2022 0919   CO2 26 06/05/2013 1127   BUN 30 (H) 01/19/2022 0919   BUN 10 06/05/2013 1127   CREATININE 1.15 (H) 01/19/2022 0919   CREATININE 0.94 06/05/2013 1127   GLUCOSE 115 (H) 01/19/2022 0919   GLUCOSE 127 (H) 06/05/2013 1127   CALCIUM 8.6 (L) 01/19/2022 0919   CALCIUM 9.1 06/05/2013 1127   AST 23 01/19/2022 0919   ALT 49 (H) 01/19/2022 0919   ALKPHOS 61 01/19/2022 0919   BILITOT 0.6 01/19/2022 0919   PROT 5.8 (L) 01/19/2022 0919   ALBUMIN 3.1 (L) 01/19/2022 0919    RADIOGRAPHIC STUDIES: MR Brain W Wo Contrast  Result Date: 01/13/2022 CLINICAL DATA:  Small-cell lung cancer with brain metastases SRS planning EXAM: MRI HEAD WITHOUT AND WITH CONTRAST TECHNIQUE: Multiplanar, multiecho pulse sequences of the brain and surrounding structures were obtained without and with intravenous contrast. CONTRAST:  80mL GADAVIST GADOBUTROL 1 MMOL/ML IV SOLN COMPARISON:  12/24/2021 FINDINGS: Brain: Redemonstrated peripherally enhancing lesion in the left basal ganglia, which measures up to 9 x 7 x 7 mm (series 1033, image 149 and series 15, image 24), previously 6 x 6 x 6 mm when remeasured similarly. This lesion is associated with hemosiderin deposition, likely central hemorrhage. Left inferior frontal gyrus lesion measures 5 x 5 x 4 mm (series 1033, image 141 and series 15, image 32), unchanged. These lesions are associated with mildly increased T2 hyperintense signal. Previously noted focus of enhancement in the  right cerebellum is no longer seen, likely treatment  effect. New small foci of enhancement are noted in the inferior left frontal lobe (series 1033, image 149), left thalamus (series 1033, image 144), and left hippocampus (series 1033, image 140). Possible faint enhancing focus in the posterior left frontal lobe (series 1033, image 195) and in the right inferior pons (series 1033, image 109 and series 1034, image 104). No restricted diffusion to suggest acute or subacute infarct. No acute hemorrhage, mass effect, or midline shift. No hydrocephalus or extra-axial collection. Vascular: Patent arterial flow voids. Skull and upper cervical spine: Normal marrow signal. Small T1 and T2 hyperintense lesions in the calvarium, consistent with benign hemangiomas. Sinuses/Orbits: No acute finding. Other: Fluid in the bilateral mastoid air cells. IMPRESSION: 1. New small foci of enhancement in the inferior left frontal lobe, left thalamus and left hippocampus, with additional possible lesions in the posterior left frontal lobe and right inferior pons, concerning for additional sites of metastatic disease. 2. Redemonstrated previously noted lesions in the left basal ganglia and left inferior frontal gyrus, with slight increase in the size of the left basal ganglia lesion. 3. Previously noted enhancing focus in the right cerebellum is no longer seen, likely treatment effect. Electronically Signed   By: Merilyn Baba M.D.   On: 01/13/2022 21:03   MR Brain W Wo Contrast  Result Date: 12/24/2021 CLINICAL DATA:  Small-cell lung cancer EXAM: MRI HEAD WITHOUT AND WITH CONTRAST TECHNIQUE: Multiplanar, multiecho pulse sequences of the brain and surrounding structures were obtained without and with intravenous contrast. CONTRAST:  6mL GADAVIST GADOBUTROL 1 MMOL/ML IV SOLN COMPARISON:  Brain MRI most recently 08/19/2021 FINDINGS: Brain: There is a new ring-enhancing lesion in the left lentiform nucleus measuring 7 mm x 6 mm. There is a  small focus of SWI signal dropout consistent with intralesional blood products. There is a new r 5 mm enhancing lesion in the left inferior frontal gyrus (18-79, 20-17). There is significant vasogenic edema surrounding these lesions in the basal ganglia, internal capsule, and extending into the left frontal operculum A punctate focus of enhancement in the right cerebellar hemisphere with associated punctate SWI signal dropout is unchanged, allowing for differences in technique (black blood space imaging was not performed on the current study). No other enhancing lesions are identified. There is no acute intracranial hemorrhage, extra-axial fluid collection, or acute infarct. Parenchymal volume is within normal limits. The ventricles are stable in size. Confluent FLAIR signal abnormality throughout the remainder of the subcortical and periventricular white matter is not significantly changed. A few small foci of chronic blood products are again seen bilaterally. There is no midline shift. Vascular: Normal flow voids. Skull and upper cervical spine: Normal marrow signal. Sinuses/Orbits: The paranasal sinuses are clear. The globes and orbits are unremarkable. Other: There are bilateral mastoid effusions, new on the left but unchanged on the right. The imaged nasopharynx is unremarkable. IMPRESSION: 1. Two new subcentimeter metastatic lesions in the left lentiform nucleus and inferior frontal gyrus with significant surrounding edema but no midline shift. 2. Unchanged punctate focus of enhancement in the right cerebellar hemisphere at the site of a previously treated metastasis. Electronically Signed   By: Valetta Mole M.D.   On: 12/24/2021 13:08    PERFORMANCE STATUS (ECOG) : 1 - Symptomatic but completely ambulatory  Review of Systems Unless otherwise noted, a complete review of systems is negative.  Physical Exam General: NAD Pulmonary: Unlabored Extremities: no edema, no joint deformities Skin: no  rashes Neurological: Weakness but otherwise nonfocal  IMPRESSION: Patient was an  add-on to my clinic schedule today at Dr. Elroy Channel request.  Unfortunately, patient has had declining performance status with evidence of new/recurrent brain metastasis.  Patient is starting whole brain radiation next week.  Patient is pending repeat CTs and bone scan next week for further discussion regarding treatment options.  However, hospice is also a reasonable option given her overall decline.  Patient sent home with ACP documents and a MOST form today.  I stressed the importance of clarifying her wishes regarding CODE STATUS.  She promises that she will discuss with her family and then we can talk more in depth about her wishes when I speak to her virtually next week.  PLAN: -Continue current scope of treatment -ACP/MOST form reviewed -DME: Bedside commode -Follow-up telephone visit next week     [place]  (From admission, onward)           Start     Ordered   01/19/22 0000  DME Bedside commode       Question:  Patient needs a bedside commode to treat with the following condition  Answer:  Weakness   01/19/22 1353             Case and plan discussed with Dr. Janese Banks  Patient expressed understanding and was in agreement with this plan. She also understands that She can call the clinic at any time with any questions, concerns, or complaints.     Time Total: 15 minutes  Visit consisted of counseling and education dealing with the complex and emotionally intense issues of symptom management and palliative care in the setting of serious and potentially life-threatening illness.Greater than 50%  of this time was spent counseling and coordinating care related to the above assessment and plan.  Signed by: Altha Harm, PhD, NP-C

## 2022-01-20 ENCOUNTER — Other Ambulatory Visit: Payer: Self-pay

## 2022-01-20 ENCOUNTER — Ambulatory Visit
Admission: RE | Admit: 2022-01-20 | Discharge: 2022-01-20 | Disposition: A | Discharge status: Home / Self Care | Payer: Medicare Other | Source: Ambulatory Visit | Attending: Radiation Oncology | Admitting: Radiation Oncology

## 2022-01-20 DIAGNOSIS — Z510 Encounter for antineoplastic radiation therapy: Secondary | ICD-10-CM | POA: Diagnosis not present

## 2022-01-20 LAB — RAD ONC ARIA SESSION SUMMARY
Course Elapsed Days: 0
Plan Fractions Treated to Date: 1
Plan Prescribed Dose Per Fraction: 2 Gy
Plan Total Fractions Prescribed: 15
Plan Total Prescribed Dose: 30 Gy
Reference Point Dosage Given to Date: 2 Gy
Reference Point Session Dosage Given: 2 Gy
Session Number: 1

## 2022-01-21 ENCOUNTER — Ambulatory Visit: Payer: Medicare Other | Admitting: Radiation Oncology

## 2022-01-21 ENCOUNTER — Inpatient Hospital Stay (HOSPITAL_BASED_OUTPATIENT_CLINIC_OR_DEPARTMENT_OTHER): Payer: Medicare Other | Admitting: Hospice and Palliative Medicine

## 2022-01-21 ENCOUNTER — Ambulatory Visit
Admission: RE | Admit: 2022-01-21 | Discharge: 2022-01-21 | Disposition: A | Discharge status: Home / Self Care | Payer: Medicare Other | Source: Ambulatory Visit | Attending: Radiation Oncology | Admitting: Radiation Oncology

## 2022-01-21 ENCOUNTER — Other Ambulatory Visit: Payer: Self-pay

## 2022-01-21 DIAGNOSIS — C3432 Malignant neoplasm of lower lobe, left bronchus or lung: Secondary | ICD-10-CM | POA: Insufficient documentation

## 2022-01-21 DIAGNOSIS — Z79899 Other long term (current) drug therapy: Secondary | ICD-10-CM | POA: Insufficient documentation

## 2022-01-21 DIAGNOSIS — F17290 Nicotine dependence, other tobacco product, uncomplicated: Secondary | ICD-10-CM | POA: Insufficient documentation

## 2022-01-21 DIAGNOSIS — C3490 Malignant neoplasm of unspecified part of unspecified bronchus or lung: Secondary | ICD-10-CM

## 2022-01-21 DIAGNOSIS — Z882 Allergy status to sulfonamides status: Secondary | ICD-10-CM | POA: Insufficient documentation

## 2022-01-21 DIAGNOSIS — I700 Atherosclerosis of aorta: Secondary | ICD-10-CM | POA: Insufficient documentation

## 2022-01-21 DIAGNOSIS — Z79890 Hormone replacement therapy (postmenopausal): Secondary | ICD-10-CM | POA: Insufficient documentation

## 2022-01-21 DIAGNOSIS — Z885 Allergy status to narcotic agent status: Secondary | ICD-10-CM | POA: Insufficient documentation

## 2022-01-21 DIAGNOSIS — Z888 Allergy status to other drugs, medicaments and biological substances status: Secondary | ICD-10-CM | POA: Insufficient documentation

## 2022-01-21 DIAGNOSIS — C7931 Secondary malignant neoplasm of brain: Secondary | ICD-10-CM | POA: Insufficient documentation

## 2022-01-21 DIAGNOSIS — Z87891 Personal history of nicotine dependence: Secondary | ICD-10-CM | POA: Insufficient documentation

## 2022-01-21 DIAGNOSIS — Z880 Allergy status to penicillin: Secondary | ICD-10-CM | POA: Insufficient documentation

## 2022-01-21 DIAGNOSIS — E039 Hypothyroidism, unspecified: Secondary | ICD-10-CM | POA: Insufficient documentation

## 2022-01-21 DIAGNOSIS — J439 Emphysema, unspecified: Secondary | ICD-10-CM | POA: Insufficient documentation

## 2022-01-21 DIAGNOSIS — Z923 Personal history of irradiation: Secondary | ICD-10-CM | POA: Insufficient documentation

## 2022-01-21 DIAGNOSIS — C7951 Secondary malignant neoplasm of bone: Secondary | ICD-10-CM | POA: Insufficient documentation

## 2022-01-21 DIAGNOSIS — K869 Disease of pancreas, unspecified: Secondary | ICD-10-CM | POA: Insufficient documentation

## 2022-01-21 DIAGNOSIS — C7889 Secondary malignant neoplasm of other digestive organs: Secondary | ICD-10-CM | POA: Insufficient documentation

## 2022-01-21 DIAGNOSIS — Z881 Allergy status to other antibiotic agents status: Secondary | ICD-10-CM | POA: Insufficient documentation

## 2022-01-21 DIAGNOSIS — G939 Disorder of brain, unspecified: Secondary | ICD-10-CM | POA: Insufficient documentation

## 2022-01-21 DIAGNOSIS — Z510 Encounter for antineoplastic radiation therapy: Secondary | ICD-10-CM | POA: Insufficient documentation

## 2022-01-21 DIAGNOSIS — Z8719 Personal history of other diseases of the digestive system: Secondary | ICD-10-CM | POA: Insufficient documentation

## 2022-01-21 LAB — RAD ONC ARIA SESSION SUMMARY
Course Elapsed Days: 1
Plan Fractions Treated to Date: 2
Plan Prescribed Dose Per Fraction: 2 Gy
Plan Total Fractions Prescribed: 15
Plan Total Prescribed Dose: 30 Gy
Reference Point Dosage Given to Date: 4 Gy
Reference Point Session Dosage Given: 2 Gy
Session Number: 2

## 2022-01-21 NOTE — Progress Notes (Signed)
Virtual Visit via Telephone Note  I connected with Beth Mcmillan on 01/21/22 at 10:00 AM EDT by telephone and verified that I am speaking with the correct person using two identifiers.  Location: Patient: Home Provider: Clinic   I discussed the limitations, risks, security and privacy concerns of performing an evaluation and management service by telephone and the availability of in person appointments. I also discussed with the patient that there may be a patient responsible charge related to this service. The patient expressed understanding and agreed to proceed.   History of Present Illness: Beth Mcmillan is a 76 y.o. 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  Patient was most recently on lurbinectedin.  She has had declining performance status.  MRI of the brain revealed new metastatic lesions with plan to repeat whole brain radiation.  Patient was referred to palliative care to help address goals and manage ongoing symptoms.   Observations/Objective: I spoke with patient by phone.  No significant changes reported since she was seen earlier this week.  She remains weak.  She is starting XRT today.  Patient is pending imaging next week and will have follow-up with her virtually to discuss goals.  Assessment and Plan: Extensive stage small cell lung cancer -starting XRT for brain mets.  She is pending imaging on 6/6.  Will have follow-up with her virtually afterwards to discuss goals.  Follow Up Instructions: Follow-up telephone visit next week   I discussed the assessment and treatment plan with the patient. The patient was provided an opportunity to ask questions and all were answered. The patient agreed with the plan and demonstrated an understanding of the instructions.   The patient was advised to  call back or seek an in-person evaluation if the symptoms worsen or if the condition fails to improve as anticipated.  I provided 5 minutes of non-face-to-face time during this encounter.   Irean Hong, NP

## 2022-01-22 ENCOUNTER — Other Ambulatory Visit: Payer: Self-pay

## 2022-01-22 ENCOUNTER — Ambulatory Visit
Admission: RE | Admit: 2022-01-22 | Discharge: 2022-01-22 | Disposition: A | Discharge status: Home / Self Care | Payer: Medicare Other | Source: Ambulatory Visit | Attending: Radiation Oncology | Admitting: Radiation Oncology

## 2022-01-22 ENCOUNTER — Telehealth: Payer: Self-pay

## 2022-01-22 DIAGNOSIS — Z510 Encounter for antineoplastic radiation therapy: Secondary | ICD-10-CM | POA: Diagnosis not present

## 2022-01-22 LAB — RAD ONC ARIA SESSION SUMMARY
Course Elapsed Days: 2
Plan Fractions Treated to Date: 3
Plan Prescribed Dose Per Fraction: 2 Gy
Plan Total Fractions Prescribed: 15
Plan Total Prescribed Dose: 30 Gy
Reference Point Dosage Given to Date: 6 Gy
Reference Point Session Dosage Given: 2 Gy
Session Number: 3

## 2022-01-22 NOTE — Telephone Encounter (Signed)
229 pm.  Phone call made to [employer] and spoke with Shelly.  Light weight wheelchair order has been canceled.  Unable to reach patient or family to address.

## 2022-01-23 ENCOUNTER — Other Ambulatory Visit: Payer: Self-pay | Admitting: Oncology

## 2022-01-24 ENCOUNTER — Encounter: Payer: Self-pay | Admitting: Oncology

## 2022-01-25 ENCOUNTER — Other Ambulatory Visit: Payer: Self-pay

## 2022-01-25 ENCOUNTER — Encounter: Payer: Self-pay | Admitting: Oncology

## 2022-01-25 ENCOUNTER — Telehealth: Payer: Self-pay | Admitting: *Deleted

## 2022-01-25 ENCOUNTER — Ambulatory Visit
Admission: RE | Admit: 2022-01-25 | Discharge: 2022-01-25 | Disposition: A | Discharge status: Home / Self Care | Payer: Medicare Other | Source: Ambulatory Visit | Attending: Radiation Oncology | Admitting: Radiation Oncology

## 2022-01-25 DIAGNOSIS — Z510 Encounter for antineoplastic radiation therapy: Secondary | ICD-10-CM | POA: Diagnosis not present

## 2022-01-25 LAB — RAD ONC ARIA SESSION SUMMARY
Course Elapsed Days: 5
Plan Fractions Treated to Date: 4
Plan Prescribed Dose Per Fraction: 2 Gy
Plan Total Fractions Prescribed: 15
Plan Total Prescribed Dose: 30 Gy
Reference Point Dosage Given to Date: 8 Gy
Reference Point Session Dosage Given: 2 Gy
Session Number: 4

## 2022-01-25 NOTE — Telephone Encounter (Signed)
Opened in error

## 2022-01-25 NOTE — Telephone Encounter (Signed)
Pts daughter is aware that pt can have bone scan inj first at 10am then proceed with contrast for CT at 11:30. She understands and agrees.

## 2022-01-26 ENCOUNTER — Ambulatory Visit
Admission: RE | Admit: 2022-01-26 | Discharge: 2022-01-26 | Disposition: A | Discharge status: Home / Self Care | Payer: Medicare Other | Source: Ambulatory Visit | Attending: Oncology | Admitting: Oncology

## 2022-01-26 ENCOUNTER — Encounter
Admission: RE | Admit: 2022-01-26 | Discharge: 2022-01-26 | Disposition: A | Discharge status: Home / Self Care | Payer: Medicare Other | Source: Ambulatory Visit | Attending: Oncology | Admitting: Oncology

## 2022-01-26 ENCOUNTER — Other Ambulatory Visit: Payer: Self-pay

## 2022-01-26 ENCOUNTER — Ambulatory Visit
Admission: RE | Admit: 2022-01-26 | Discharge: 2022-01-26 | Disposition: A | Discharge status: Home / Self Care | Payer: Medicare Other | Source: Ambulatory Visit | Attending: Radiation Oncology | Admitting: Radiation Oncology

## 2022-01-26 DIAGNOSIS — C3490 Malignant neoplasm of unspecified part of unspecified bronchus or lung: Secondary | ICD-10-CM | POA: Diagnosis not present

## 2022-01-26 DIAGNOSIS — Z510 Encounter for antineoplastic radiation therapy: Secondary | ICD-10-CM | POA: Diagnosis not present

## 2022-01-26 LAB — RAD ONC ARIA SESSION SUMMARY
Course Elapsed Days: 6
Plan Fractions Treated to Date: 5
Plan Prescribed Dose Per Fraction: 2 Gy
Plan Total Fractions Prescribed: 15
Plan Total Prescribed Dose: 30 Gy
Reference Point Dosage Given to Date: 10 Gy
Reference Point Session Dosage Given: 2 Gy
Session Number: 5

## 2022-01-26 MED ORDER — TECHNETIUM TC 99M MEDRONATE IV KIT
20.0000 | PACK | Freq: Once | INTRAVENOUS | Status: AC | PRN
  Administered 2022-01-26: 21.76 via INTRAVENOUS

## 2022-01-26 MED ORDER — IOHEXOL 300 MG/ML  SOLN
100.0000 mL | Freq: Once | INTRAMUSCULAR | Status: AC | PRN
Start: 2022-01-26 — End: 2022-01-26
  Administered 2022-01-26: 100 mL via INTRAVENOUS

## 2022-01-27 ENCOUNTER — Other Ambulatory Visit: Payer: Self-pay

## 2022-01-27 ENCOUNTER — Ambulatory Visit
Admission: RE | Admit: 2022-01-27 | Discharge: 2022-01-27 | Disposition: A | Discharge status: Home / Self Care | Payer: Medicare Other | Source: Ambulatory Visit | Attending: Radiation Oncology | Admitting: Radiation Oncology

## 2022-01-27 DIAGNOSIS — Z510 Encounter for antineoplastic radiation therapy: Secondary | ICD-10-CM | POA: Diagnosis not present

## 2022-01-27 LAB — RAD ONC ARIA SESSION SUMMARY
Course Elapsed Days: 7
Plan Fractions Treated to Date: 6
Plan Prescribed Dose Per Fraction: 2 Gy
Plan Total Fractions Prescribed: 15
Plan Total Prescribed Dose: 30 Gy
Reference Point Dosage Given to Date: 12 Gy
Reference Point Session Dosage Given: 2 Gy
Session Number: 6

## 2022-01-28 ENCOUNTER — Ambulatory Visit
Admission: RE | Admit: 2022-01-28 | Discharge: 2022-01-28 | Disposition: A | Discharge status: Home / Self Care | Payer: Medicare Other | Source: Ambulatory Visit | Attending: Radiation Oncology | Admitting: Radiation Oncology

## 2022-01-28 ENCOUNTER — Other Ambulatory Visit: Payer: Self-pay

## 2022-01-28 ENCOUNTER — Other Ambulatory Visit: Payer: Self-pay | Admitting: Oncology

## 2022-01-28 DIAGNOSIS — Z510 Encounter for antineoplastic radiation therapy: Secondary | ICD-10-CM | POA: Diagnosis not present

## 2022-01-28 LAB — RAD ONC ARIA SESSION SUMMARY
Course Elapsed Days: 8
Plan Fractions Treated to Date: 7
Plan Prescribed Dose Per Fraction: 2 Gy
Plan Total Fractions Prescribed: 15
Plan Total Prescribed Dose: 30 Gy
Reference Point Dosage Given to Date: 14 Gy
Reference Point Session Dosage Given: 2 Gy
Session Number: 7

## 2022-01-28 NOTE — Telephone Encounter (Signed)
Component Ref Range & Units 9 d ago (01/19/22) 2 wk ago (01/08/22) 3 wk ago (01/07/22) 1 mo ago (12/29/21) 1 mo ago (12/17/21) 1 mo ago (12/08/21) 2 mo ago (11/17/21)  Potassium 3.5 - 5.1 mmol/L 3.1 Low   3.1 Low   3.2 Low   2.9 Low   3.9  3.0 Low   3.4 Low

## 2022-01-29 ENCOUNTER — Inpatient Hospital Stay (HOSPITAL_BASED_OUTPATIENT_CLINIC_OR_DEPARTMENT_OTHER): Payer: Medicare Other | Admitting: Hospice and Palliative Medicine

## 2022-01-29 ENCOUNTER — Ambulatory Visit
Admission: RE | Admit: 2022-01-29 | Discharge: 2022-01-29 | Disposition: A | Discharge status: Home / Self Care | Payer: Medicare Other | Source: Ambulatory Visit | Attending: Radiation Oncology | Admitting: Radiation Oncology

## 2022-01-29 ENCOUNTER — Other Ambulatory Visit: Payer: Self-pay

## 2022-01-29 DIAGNOSIS — Z510 Encounter for antineoplastic radiation therapy: Secondary | ICD-10-CM | POA: Diagnosis not present

## 2022-01-29 DIAGNOSIS — C3490 Malignant neoplasm of unspecified part of unspecified bronchus or lung: Secondary | ICD-10-CM

## 2022-01-29 LAB — RAD ONC ARIA SESSION SUMMARY
Course Elapsed Days: 9
Plan Fractions Treated to Date: 8
Plan Prescribed Dose Per Fraction: 2 Gy
Plan Total Fractions Prescribed: 15
Plan Total Prescribed Dose: 30 Gy
Reference Point Dosage Given to Date: 16 Gy
Reference Point Session Dosage Given: 2 Gy
Session Number: 8

## 2022-01-29 NOTE — Progress Notes (Signed)
I tried reaching patient by phone but she was sleeping.  I spoke with her husband he reports that patient is still significantly weak and has not demonstrated much improvement.  Plan was to discuss imaging results with her today.  CT of the chest, abdomen, and pelvis on 6/6 revealed interval decrease in size of left lower lobe nodule and pancreatic mass but new T10 and L4 lesions.  Bone scan confirms new skeletal metastasis.  We will plan follow-up telephone visit next week to address goals

## 2022-02-01 ENCOUNTER — Ambulatory Visit
Admission: RE | Admit: 2022-02-01 | Discharge: 2022-02-01 | Disposition: A | Discharge status: Home / Self Care | Payer: Medicare Other | Source: Ambulatory Visit | Attending: Radiation Oncology | Admitting: Radiation Oncology

## 2022-02-01 ENCOUNTER — Other Ambulatory Visit: Payer: Self-pay

## 2022-02-01 DIAGNOSIS — Z510 Encounter for antineoplastic radiation therapy: Secondary | ICD-10-CM | POA: Diagnosis not present

## 2022-02-01 LAB — RAD ONC ARIA SESSION SUMMARY
Course Elapsed Days: 12
Plan Fractions Treated to Date: 9
Plan Prescribed Dose Per Fraction: 2 Gy
Plan Total Fractions Prescribed: 15
Plan Total Prescribed Dose: 30 Gy
Reference Point Dosage Given to Date: 18 Gy
Reference Point Session Dosage Given: 2 Gy
Session Number: 9

## 2022-02-02 ENCOUNTER — Ambulatory Visit
Admission: RE | Admit: 2022-02-02 | Discharge: 2022-02-02 | Disposition: A | Discharge status: Home / Self Care | Payer: Medicare Other | Source: Ambulatory Visit | Attending: Radiation Oncology | Admitting: Radiation Oncology

## 2022-02-02 ENCOUNTER — Other Ambulatory Visit: Payer: Self-pay

## 2022-02-02 DIAGNOSIS — Z510 Encounter for antineoplastic radiation therapy: Secondary | ICD-10-CM | POA: Diagnosis not present

## 2022-02-02 LAB — RAD ONC ARIA SESSION SUMMARY
Course Elapsed Days: 13
Plan Fractions Treated to Date: 10
Plan Prescribed Dose Per Fraction: 2 Gy
Plan Total Fractions Prescribed: 15
Plan Total Prescribed Dose: 30 Gy
Reference Point Dosage Given to Date: 20 Gy
Reference Point Session Dosage Given: 2 Gy
Session Number: 10

## 2022-02-03 ENCOUNTER — Telehealth: Payer: Self-pay | Admitting: *Deleted

## 2022-02-03 ENCOUNTER — Encounter: Payer: Self-pay | Admitting: Oncology

## 2022-02-03 ENCOUNTER — Other Ambulatory Visit: Payer: Self-pay

## 2022-02-03 ENCOUNTER — Ambulatory Visit
Admission: RE | Admit: 2022-02-03 | Discharge: 2022-02-03 | Disposition: A | Discharge status: Home / Self Care | Payer: Medicare Other | Source: Ambulatory Visit | Attending: Radiation Oncology | Admitting: Radiation Oncology

## 2022-02-03 ENCOUNTER — Inpatient Hospital Stay (HOSPITAL_BASED_OUTPATIENT_CLINIC_OR_DEPARTMENT_OTHER): Payer: Medicare Other | Admitting: Hospice and Palliative Medicine

## 2022-02-03 ENCOUNTER — Inpatient Hospital Stay (HOSPITAL_BASED_OUTPATIENT_CLINIC_OR_DEPARTMENT_OTHER): Payer: Medicare Other | Admitting: Oncology

## 2022-02-03 VITALS — BP 119/59 | HR 88 | Temp 98.0°F | Resp 20 | Wt 147.8 lb

## 2022-02-03 DIAGNOSIS — R531 Weakness: Secondary | ICD-10-CM

## 2022-02-03 DIAGNOSIS — C3490 Malignant neoplasm of unspecified part of unspecified bronchus or lung: Secondary | ICD-10-CM | POA: Diagnosis not present

## 2022-02-03 DIAGNOSIS — Z7189 Other specified counseling: Secondary | ICD-10-CM | POA: Diagnosis not present

## 2022-02-03 DIAGNOSIS — Z510 Encounter for antineoplastic radiation therapy: Secondary | ICD-10-CM | POA: Diagnosis not present

## 2022-02-03 LAB — RAD ONC ARIA SESSION SUMMARY
Course Elapsed Days: 14
Plan Fractions Treated to Date: 11
Plan Prescribed Dose Per Fraction: 2 Gy
Plan Total Fractions Prescribed: 15
Plan Total Prescribed Dose: 30 Gy
Reference Point Dosage Given to Date: 22 Gy
Reference Point Session Dosage Given: 2 Gy
Session Number: 11

## 2022-02-03 NOTE — Progress Notes (Signed)
Per daughter since starting radiation again pt has declined; very fatigues no energy, weak, not wanting to eat/drink. Pt states her legs feel sore and weak not sore to the touch.

## 2022-02-03 NOTE — Telephone Encounter (Signed)
Daughter called with concerns regarding this patient. She reports that patient is unable to stand or ambulate without physical assistance, her legs are very weak, She is not eating or drinking and she is calling out speaking to people who are deceased. She is still getting radiation therapy until 6/20 and she has no follow up with Dr Janese Banks at this time. She is wanting to discuss all this with Dr Janese Banks. I explained to her that patient may need to be seen before she completes radiation therapy and she states patient as appointment with radiation therapy at 330 today if we want to see her, please call Nicki. Please advise

## 2022-02-03 NOTE — Progress Notes (Signed)
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  She underwent CT chest with contrast in March 2022 following an abnormal x-ray which showed a 2.6 cm mass in the left lower lobe.  This was followed by a PET CT scan which showed a 3.0 x 2.0 cm mass in the left lower lobe of the lung with an SUV of 9.7.  No evidence of locoregional adenopathy.  No evidence of intra-abdominal disease with patient was noted to have a hypermetabolic focus in the right iliac bone with an SUV of 11.1 which was suspicious for metastatic disease.  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  Cells were positive for TTF-1, CD56 and chromogranin.  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  Patient completed whole brain radiation treatment and completed carbo etoposide Tecentriq 4 cycles on 03/10/2021.  Disease progression in March 2022 with new bone metastases and pancreatic metastases    Interval history-patient has been doing  poorly overall.  She now requires assistance with basic ADLs and needs to be lifted up by another person to be placed on a wheelchair.  Appetite is poor.  There have been times when she has been confused and calling out to deceased people.  She spends most of her time resting in bed.  ECOG PS- 3 Pain scale- 0   Review of systems- Review of Systems  Constitutional:  Positive for malaise/fatigue and weight loss. Negative for chills and fever.  HENT:  Negative for congestion, ear discharge and nosebleeds.   Eyes:  Negative for blurred vision.  Respiratory:  Negative for cough, hemoptysis, sputum production, shortness of breath and wheezing.   Cardiovascular:  Negative for chest pain, palpitations, orthopnea and claudication.  Gastrointestinal:  Negative for abdominal pain, blood in stool, constipation, diarrhea, heartburn, melena, nausea and vomiting.  Genitourinary:  Negative for dysuria, flank pain, frequency, hematuria and urgency.  Musculoskeletal:  Negative for back pain, joint pain and myalgias.  Skin:  Negative for rash.  Neurological:  Positive for weakness. Negative for dizziness, tingling, focal weakness, seizures and headaches.       Confusion  Endo/Heme/Allergies:  Does not bruise/bleed easily.  Psychiatric/Behavioral:  Negative for depression and suicidal ideas. The patient does not have insomnia.       Allergies  Allergen Reactions   Augmentin [Amoxicillin-Pot Clavulanate]     "messes up my blood cells"   Codeine Nausea Only   Levaquin [Levofloxacin]     Delusions    Sulfa Antibiotics Nausea And Vomiting   Vancomycin Other (See Comments)    Redman syndome- happened in the  hospital at [facility]   Tape Rash    Some bandaids cause rash sometimes     Past Medical History:  Diagnosis Date   Anxiety    Breathing problem    low breathing function 53%   COPD (chronic obstructive pulmonary disease) (HCC)    EMPHYSEMA   Dysrhythmia    tachycardia   Emphysema of lung (HCC)     Hemorrhoid    History of hiatal hernia    SMALL- 2022   Hypercholesteremia    Hypertension    Hypothyroidism    Larynx polyp    Lung cancer (HCC)    Palpitations    with anxiety   Platelets decreased ([place]) 2020   Seasonal allergies    Tinnitus    Vertigo    no episodes in several years   Wears dentures    partial lower   White coat syndrome with hypertension      Past Surgical History:  Procedure Laterality Date   ABDOMINAL HYSTERECTOMY     partial   COLONOSCOPY WITH PROPOFOL N/A 01/22/2016   Procedure: COLONOSCOPY WITH PROPOFOL;  Surgeon: Lucilla Lame, MD;  Location: [place];  Service: Endoscopy;  Laterality: N/A;   COLONOSCOPY WITH PROPOFOL N/A 04/12/2019   Procedure: COLONOSCOPY WITH BIOPSY;  Surgeon: Lucilla Lame, MD;  Location: [place];  Service: Endoscopy;  Laterality: N/A;  pt would like an early appt   ct guided lung bx     IR IMAGING GUIDED PORT INSERTION  12/18/2020   POLYPECTOMY  01/22/2016   Procedure: POLYPECTOMY;  Surgeon: Lucilla Lame, MD;  Location: [place];  Service: Endoscopy;;   POLYPECTOMY N/A 04/12/2019   Procedure: POLYPECTOMY;  Surgeon: Lucilla Lame, MD;  Location: [place];  Service: Endoscopy;  Laterality: N/A;   VIDEO BRONCHOSCOPY WITH ENDOBRONCHIAL NAVIGATION N/A 11/21/2020   Procedure: VIDEO BRONCHOSCOPY WITH ENDOBRONCHIAL NAVIGATION;  Surgeon: Ottie Glazier, MD;  Location: [facility];  Service: Thoracic;  Laterality: N/A;   VIDEO BRONCHOSCOPY WITH ENDOBRONCHIAL ULTRASOUND N/A 11/21/2020   Procedure: VIDEO BRONCHOSCOPY WITH ENDOBRONCHIAL ULTRASOUND;  Surgeon: Ottie Glazier, MD;  Location: [facility];  Service: Thoracic;  Laterality: N/A;    Social History   Socioeconomic History   Marital status: Married    Spouse name: Not on file   Number of children: Not on file   Years of education: Not on file   Highest education level: Not on file  Occupational History   Not on file  Tobacco Use   Smoking  status: Former    Packs/day: 0.50    Years: 50.00    Total pack years: 25.00    Types: Cigarettes    Quit date: 11/03/2020    Years since quitting: 1.2   Smokeless tobacco: Never  Vaping Use   Vaping Use: Some days  Substance and Sexual Activity   Alcohol use: No   Drug use: Never   Sexual activity: Not Currently  Other Topics Concern   Not on file  Social History Narrative   Not on file   Social Determinants of Health   Financial Resource Strain: Not on file  Food Insecurity: Not on file  Transportation Needs: Not on file  Physical Activity: Not on file  Stress: Not on file  Social Connections: Not on file  Intimate Partner Violence: Not on file    Family History  Problem Relation Age of Onset   Alzheimer's disease Mother    Heart attack Mother    High Cholesterol Mother  Hypertension Mother    Heart block Mother    Lung cancer Father    Uterine cancer Sister    Hypertension Sister    Heart block Sister    Anxiety disorder Sister    Breast cancer Neg Hx      Current Outpatient Medications:    acetaminophen (TYLENOL) 500 MG tablet, Take 500 mg by mouth every 6 (six) hours as needed for mild pain or moderate pain., Disp: , Rfl:    atorvastatin (LIPITOR) 10 MG tablet, Take 1 tablet by mouth daily., Disp: , Rfl:    dexamethasone (DECADRON) 4 MG tablet, Take 1 tablet (4 mg total) by mouth 2 (two) times daily with a meal., Disp: 40 tablet, Rfl: 0   hydrALAZINE (APRESOLINE) 50 MG tablet, Take 50 mg by mouth 2 (two) times daily., Disp: , Rfl:    ketotifen (ZADITOR) 0.025 % ophthalmic solution, Place 1 drop into both eyes 2 (two) times daily as needed., Disp: , Rfl:    levothyroxine (SYNTHROID, LEVOTHROID) 50 MCG tablet, Take 50 mcg by mouth daily before breakfast., Disp: , Rfl:    loperamide (IMODIUM) 2 MG capsule, Take 2 mg by mouth as needed for diarrhea or loose stools., Disp: , Rfl:    loratadine (CLARITIN) 10 MG tablet, Take 10 mg by mouth daily. Reported on  01/22/2016, Disp: , Rfl:    LORazepam (ATIVAN) 0.5 MG tablet, TAKE 1 TABLET(0.5 MG) BY MOUTH EVERY 6 HOURS AS NEEDED FOR ANXIETY, Disp: 120 tablet, Rfl: 0   losartan-hydrochlorothiazide (HYZAAR) 100-25 MG tablet, Take 1 tablet by mouth daily., Disp: , Rfl:    magnesium chloride (SLOW-MAG) 64 MG TBEC SR tablet, Take 1 tablet (64 mg total) by mouth daily., Disp: 30 tablet, Rfl: 0   metoprolol succinate (TOPROL-XL) 50 MG 24 hr tablet, Take 50 mg by mouth daily., Disp: , Rfl:    nystatin (MYCOSTATIN) 100000 UNIT/ML suspension, Take 5 mLs (500,000 Units total) by mouth 4 (four) times daily., Disp: 60 mL, Rfl: 1   polyethylene glycol (MIRALAX / GLYCOLAX) 17 g packet, Take 17 g by mouth daily as needed for moderate constipation (pt takes it daily for 7 days after each chemo treatment)., Disp: , Rfl:    potassium chloride SA (KLOR-CON M) 20 MEQ tablet, TAKE 1 TABLET(20 MEQ) BY MOUTH DAILY, Disp: 30 tablet, Rfl: 1   QUEtiapine (SEROQUEL) 25 MG tablet, Take 1 tablet (25 mg total) by mouth at bedtime., Disp: 30 tablet, Rfl: 2   senna-docusate (SENOKOT-S) 8.6-50 MG tablet, Take 1 tablet by mouth daily as needed for mild constipation (pt takes the tablet 2 a dayfor 5 days after chemotherapy treatment)., Disp: , Rfl:    sodium chloride 1 g tablet, Take 1 g by mouth in the morning, at noon, in the evening, and at bedtime., Disp: , Rfl:    TRELEGY ELLIPTA 100-62.5-25 MCG/ACT AEPB, INHALE 1 PUFF INTO THE LUNGS DAILY, Disp: 60 each, Rfl: 3   VITAMIN MIXTURE PO, Take 1 tablet by mouth in the morning, at noon, and at bedtime. Balance of nature - fruit, Disp: , Rfl:    VITAMIN MIXTURE PO, Take 1 tablet by mouth 3 (three) times daily. 1 tablet by mouth morning, noon, and night Balance of Nature: veggie, Disp: , Rfl:    ondansetron (ZOFRAN) 8 MG tablet, Take 1 tablet (8 mg total) by mouth 2 (two) times daily as needed for refractory nausea / vomiting. Start on day 3 after chemotherapy. (Patient not taking: Reported on  12/29/2021), Disp:  30 tablet, Rfl: 1   prochlorperazine (COMPAZINE) 10 MG tablet, Take 1 tablet (10 mg total) by mouth every 6 (six) hours as needed (Nausea or vomiting). (Patient not taking: Reported on 12/29/2021), Disp: 30 tablet, Rfl: 1 No current facility-administered medications for this visit.  Facility-Administered Medications Ordered in Other Visits:    0.9 %  sodium chloride infusion, , Intravenous, Once, Earlie Server, MD  Physical exam:  Vitals:   02/03/22 1136  BP: (!) 119/59  Pulse: 88  Resp: 20  Temp: 98 F (36.7 C)  SpO2: 100%  Weight: 147 lb 12.8 oz (67 kg)   Physical Exam Constitutional:      Comments: Sitting in a wheelchair.  Appears frail and fatigued  Cardiovascular:     Rate and Rhythm: Normal rate and regular rhythm.     Heart sounds: Normal heart sounds.  Pulmonary:     Effort: Pulmonary effort is normal.     Breath sounds: Normal breath sounds.  Musculoskeletal:     Cervical back: Normal range of motion.     Comments: Trace bilateral edema  Skin:    General: Skin is warm and dry.  Neurological:     Mental Status: She is alert and oriented to person, place, and time.         Latest Ref Rng & Units 01/19/2022    9:19 AM  CMP  Glucose 70 - 99 mg/dL 115   BUN 8 - 23 mg/dL 30   Creatinine 0.44 - 1.00 mg/dL 1.15   Sodium 135 - 145 mmol/L 128   Potassium 3.5 - 5.1 mmol/L 3.1   Chloride 98 - 111 mmol/L 101   CO2 22 - 32 mmol/L 22   Calcium 8.9 - 10.3 mg/dL 8.6   Total Protein 6.5 - 8.1 g/dL 5.8   Total Bilirubin 0.3 - 1.2 mg/dL 0.6   Alkaline Phos 38 - 126 U/L 61   AST 15 - 41 U/L 23   ALT 0 - 44 U/L 49       Latest Ref Rng & Units 01/19/2022    9:19 AM  CBC  WBC 4.0 - 10.5 K/uL 11.0   Hemoglobin 12.0 - 15.0 g/dL 9.6   Hematocrit 36.0 - 46.0 % 27.7   Platelets 150 - 400 K/uL 68     No images are attached to the encounter.  NM Bone Scan Whole Body  Result Date: 01/27/2022 CLINICAL DATA:  Lung cancer with bone Mets EXAM: NUCLEAR MEDICINE WHOLE  BODY BONE SCAN TECHNIQUE: Whole body anterior and posterior images were obtained approximately 3 hours after intravenous injection of radiopharmaceutical. RADIOPHARMACEUTICALS:  21.76 mCi Technetium-31m MDP IV COMPARISON:  CT 01/26/2022, PET CT 10/28/2021, bone scan 10/12/2021 FINDINGS: Focal soft tissue activity at the right antecubital fossa consistent with injection site. Multifocal skeletal uptake consistent with osseous metastatic disease. Small focus of activity in the left femoral shaft, new as compared with the bone scan from February, demonstrated on PET CT from March. Faint right iliac activity at the site of known skeletal metastatic focus, no significant change. Patchy left anterior acetabular activity, less intense as compared with previous bone scan. Bilateral scapular activity near the glenoid, new as compared to the previous bone scan, demonstrated on the PET CT from March. Mild activity at the left third rib, new compared to the prior bone scan and PET CT. Faint activity at T10, corresponding to the known metastatic lesion here. IMPRESSION: 1. Multifocal foci of activity, consistent with skeletal metastatic disease with new  lesions present when compared to the prior bone scan from February of 2023 but demonstrated on the interval PET CT from March. Possible new left third rib lesion since the PET CT from March. Electronically Signed   By: Donavan Foil M.D.   On: 01/27/2022 18:26   CT CHEST ABDOMEN PELVIS W CONTRAST  Result Date: 01/27/2022 CLINICAL DATA:  Small-cell lung cancer with bone metastases. Restaging. * Tracking Code: BO * EXAM: CT CHEST, ABDOMEN, AND PELVIS WITH CONTRAST TECHNIQUE: Multidetector CT imaging of the chest, abdomen and pelvis was performed following the standard protocol during bolus administration of intravenous contrast. RADIATION DOSE REDUCTION: This exam was performed according to the departmental dose-optimization program which includes automated exposure control,  adjustment of the mA and/or kV according to patient size and/or use of iterative reconstruction technique. CONTRAST:  172mL OMNIPAQUE IOHEXOL 300 MG/ML  SOLN COMPARISON:  Head CT 10/28/2021 FINDINGS: CT CHEST FINDINGS Cardiovascular: The heart size is normal. No substantial pericardial effusion. Coronary artery calcification is evident. Mild atherosclerotic calcification is noted in the wall of the thoracic aorta. Right Port-A-Cath tip is positioned at the SVC/RA junction. Mediastinum/Nodes: No mediastinal lymphadenopathy. There is no hilar lymphadenopathy. Tiny hiatal hernia. The esophagus has normal imaging features. There is no axillary lymphadenopathy. Lungs/Pleura: Centrilobular emphsyema noted. 1.1 x 0.9 cm left lower lobe pulmonary nodule has decreased from 1.4 x 1.0 cm previously. Architectural distortion and volume loss in the left lower lobe in the region of the nodule likely reflects post treatment scarring. No new suspicious pulmonary nodule or mass. No pleural effusion. Musculoskeletal: 10 mm lesion in the T10 vertebral body is new in the interval, potentially related to healing metastatic lesion. CT ABDOMEN PELVIS FINDINGS Hepatobiliary: No suspicious focal abnormality within the liver parenchyma. There is no evidence for gallstones, gallbladder wall thickening, or pericholecystic fluid. No intrahepatic or extrahepatic biliary dilation. Pancreas: Subtle 8 mm nodule in the body of pancreas, corresponding to the hypermetabolic lesion on previous PET-CT. This has decreased from 19 mm on that prior study. 10 mm nodule in the tip of the pancreatic tail today was seen to be hypermetabolic on the previous PET-CT where it measured 2.2 cm. No dilatation of the main duct. No intraparenchymal cyst. No peripancreatic edema. Spleen: No splenomegaly. No focal mass lesion. Adrenals/Urinary Tract: No adrenal nodule or mass. Left kidney mildly atrophic. Right kidney unremarkable. No evidence for hydroureter. Gas in the  bladder lumen is presumably secondary to recent instrumentation. Stomach/Bowel: Tiny hiatal hernia. Stomach otherwise unremarkable. Duodenum is normally positioned as is the ligament of Treitz. No small bowel wall thickening. No small bowel dilatation. The terminal ileum is normal. The appendix is normal. No gross colonic mass. No colonic wall thickening. Vascular/Lymphatic: There is moderate atherosclerotic calcification of the abdominal aorta without aneurysm. There is no gastrohepatic or hepatoduodenal ligament lymphadenopathy. No retroperitoneal or mesenteric lymphadenopathy. No pelvic sidewall lymphadenopathy. Reproductive: Hysterectomy.  There is no adnexal mass. Other: No intraperitoneal free fluid. Musculoskeletal: Mixed lytic and sclerotic changes in the right iliac bone along the SI joint are similar to prior. 12 mm sclerotic lesion in the L4 vertebral body appears new in the interval, also potentially related to healing metastatic disease. IMPRESSION: 1. Interval decrease in size of the left lower lobe pulmonary nodule with post treatment scarring in the left lower lobe. No findings to suggest new soft tissue metastases in the chest abdomen or pelvis today. 2. Hypermetabolic pancreatic lesion seen on previous PET-CT have decreased in size in the interval. 3.  10 mm lesion in the T10 vertebral body and 12 mm lesion in the L4 vertebral body are new in the interval, potentially related to healing metastatic lesions. 4. Stable appearance of mixed lytic and sclerotic changes in the right iliac bone along the SI joint. 5. Tiny hiatal hernia. 6. Gas in the bladder lumen is presumably secondary to recent instrumentation. 7. Aortic Atherosclerosis (ICD10-I70.0) and Emphysema (ICD10-J43.9). Electronically Signed   By: Misty Stanley M.D.   On: 01/27/2022 12:57   MR Brain W Wo Contrast  Result Date: 01/13/2022 CLINICAL DATA:  Small-cell lung cancer with brain metastases SRS planning EXAM: MRI HEAD WITHOUT AND WITH  CONTRAST TECHNIQUE: Multiplanar, multiecho pulse sequences of the brain and surrounding structures were obtained without and with intravenous contrast. CONTRAST:  17mL GADAVIST GADOBUTROL 1 MMOL/ML IV SOLN COMPARISON:  12/24/2021 FINDINGS: Brain: Redemonstrated peripherally enhancing lesion in the left basal ganglia, which measures up to 9 x 7 x 7 mm (series 1033, image 149 and series 15, image 24), previously 6 x 6 x 6 mm when remeasured similarly. This lesion is associated with hemosiderin deposition, likely central hemorrhage. Left inferior frontal gyrus lesion measures 5 x 5 x 4 mm (series 1033, image 141 and series 15, image 32), unchanged. These lesions are associated with mildly increased T2 hyperintense signal. Previously noted focus of enhancement in the right cerebellum is no longer seen, likely treatment effect. New small foci of enhancement are noted in the inferior left frontal lobe (series 1033, image 149), left thalamus (series 1033, image 144), and left hippocampus (series 1033, image 140). Possible faint enhancing focus in the posterior left frontal lobe (series 1033, image 195) and in the right inferior pons (series 1033, image 109 and series 1034, image 104). No restricted diffusion to suggest acute or subacute infarct. No acute hemorrhage, mass effect, or midline shift. No hydrocephalus or extra-axial collection. Vascular: Patent arterial flow voids. Skull and upper cervical spine: Normal marrow signal. Small T1 and T2 hyperintense lesions in the calvarium, consistent with benign hemangiomas. Sinuses/Orbits: No acute finding. Other: Fluid in the bilateral mastoid air cells. IMPRESSION: 1. New small foci of enhancement in the inferior left frontal lobe, left thalamus and left hippocampus, with additional possible lesions in the posterior left frontal lobe and right inferior pons, concerning for additional sites of metastatic disease. 2. Redemonstrated previously noted lesions in the left basal  ganglia and left inferior frontal gyrus, with slight increase in the size of the left basal ganglia lesion. 3. Previously noted enhancing focus in the right cerebellum is no longer seen, likely treatment effect. Electronically Signed   By: Merilyn Baba M.D.   On: 01/13/2022 21:03     Assessment and plan- Patient is a 76 y.o. female with extensive stage small cell lung cancer with bone, pancreatic and brain metastases.  She is here to discuss further goals of care  I have reviewed CT chest abdomen and pelvis as well as bone scan images independentlyWhich shows somewhat mixed response to treatment.  There has been improvement in the size of her primary lung lesion as well as pancreatic lesion.  Although there are reportedly new bone lesions they were present on prior PET scan in March 2023 when her treatment was changed to lurbinectedin.  I therefore do not think that she has had true progression on lurbinectedin.  However patient has a poor performance status which is at best 3.  She is tolerating whole brain radiation poorly and this is her second time getting  her whole brain radiation.  I discussed with patient and family again that given her overall declining performance status I would recommend to continue full brain radiation.  She is not clearly a candidate for systemic chemotherapy.  I would recommend home hospice.  However patient is adamant about continuing radiation at this time and I will defer to Dr. Baruch Gouty to see if whole brain radiation can be continued.  Patient also declines hospice at this time.  She wishes to remain full code.  I will see her next week with labs and possible fluids.  Her overall prognosis is poor likely in weeks.  Patient's family understand her overall prognosis but understandably wish to follow patient's wishes although they know that it is likely to be futile at this time to pursue any aggressive treatment   Visit Diagnosis 1. Goals of care, counseling/discussion   2.  Small cell lung cancer in adult [facility])      Dr. Randa Evens, MD, MPH [facility] [phone] 02/03/2022 4:22 PM

## 2022-02-03 NOTE — Telephone Encounter (Signed)
Appointment accepted per [facility], Please call radiation therapy when done to see if she can be worked in I have spoken to them about this and it depends on time she is finished with Dr Janese Banks

## 2022-02-03 NOTE — Telephone Encounter (Signed)
I can see her at 11.30 today

## 2022-02-03 NOTE — Progress Notes (Signed)
[place]  [phone] [phone]   Name: Beth Mcmillan Date: 02/03/2022 MRN: [id number]  DOB: 1945-09-29  Patient Care Team: Idelle Crouch, MD as PCP - General (Internal Medicine) Telford Nab, RN as Oncology Nurse Navigator Sindy Guadeloupe, MD as Attending Physician (Hematology)    REASON FOR CONSULTATION: Beth Mcmillan is a 76 y.o. 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  Patient was most recently on lurbinectedin.  She has had declining performance status.  MRI of the brain revealed new metastatic lesions with plan to repeat whole brain radiation.  Patient was referred to palliative care to help address goals and manage ongoing symptoms.  SOCIAL HISTORY:     reports that she quit smoking about 15 months ago. Her smoking use included cigarettes. She has a 25.00 pack-year smoking history. She has never used smokeless tobacco. She reports that she does not drink alcohol and does not use drugs.  Patient lives at home with her husband.  She has a son and daughter who are involved in her care.  Patient previously worked as a Network engineer at [employer] and then later [employer].  ADVANCE DIRECTIVES:  Not on file  CODE STATUS:   PAST MEDICAL HISTORY: Past Medical History:  Diagnosis Date   Anxiety    Breathing problem    low breathing function 53%   COPD (chronic obstructive pulmonary disease) (HCC)    EMPHYSEMA   Dysrhythmia    tachycardia   Emphysema of lung (HCC)    Hemorrhoid    History of hiatal hernia    SMALL- 2022   Hypercholesteremia    Hypertension    Hypothyroidism    Larynx polyp    Lung cancer (HCC)    Palpitations    with anxiety   Platelets decreased ([place]) 2020   Seasonal allergies     Tinnitus    Vertigo    no episodes in several years   Wears dentures    partial lower   White coat syndrome with hypertension     PAST SURGICAL HISTORY:  Past Surgical History:  Procedure Laterality Date   ABDOMINAL HYSTERECTOMY     partial   COLONOSCOPY WITH PROPOFOL N/A 01/22/2016   Procedure: COLONOSCOPY WITH PROPOFOL;  Surgeon: Lucilla Lame, MD;  Location: [place];  Service: Endoscopy;  Laterality: N/A;   COLONOSCOPY WITH PROPOFOL N/A 04/12/2019   Procedure: COLONOSCOPY WITH BIOPSY;  Surgeon: Lucilla Lame, MD;  Location: [place];  Service: Endoscopy;  Laterality: N/A;  pt would like an early appt   ct guided lung bx     IR IMAGING GUIDED PORT INSERTION  12/18/2020   POLYPECTOMY  01/22/2016   Procedure: POLYPECTOMY;  Surgeon: Lucilla Lame, MD;  Location: [place];  Service: Endoscopy;;   POLYPECTOMY N/A 04/12/2019   Procedure: POLYPECTOMY;  Surgeon: Lucilla Lame, MD;  Location: [place];  Service: Endoscopy;  Laterality: N/A;   VIDEO BRONCHOSCOPY WITH ENDOBRONCHIAL NAVIGATION N/A 11/21/2020   Procedure: VIDEO BRONCHOSCOPY WITH ENDOBRONCHIAL NAVIGATION;  Surgeon: Ottie Glazier, MD;  Location: [facility];  Service: Thoracic;  Laterality: N/A;   VIDEO BRONCHOSCOPY WITH ENDOBRONCHIAL ULTRASOUND N/A 11/21/2020   Procedure: VIDEO BRONCHOSCOPY WITH ENDOBRONCHIAL ULTRASOUND;  Surgeon: Ottie Glazier, MD;  Location: [facility];  Service: Thoracic;  Laterality: N/A;    HEMATOLOGY/ONCOLOGY HISTORY:  Oncology History  Small cell lung cancer in adult [facility])  12/13/2020 Initial Diagnosis   Small cell lung cancer in adult [facility])   12/22/2020 - 10/27/2021 Chemotherapy   Patient is on Treatment Plan : LUNG SCLC Carboplatin + Etoposide + Atezolizumab Induction q21d / Atezolizumab Maintenance q21d     12/22/2020 Cancer Staging   Staging form: Lung, AJCC 8th Edition - Clinical stage from 12/22/2020: Stage IV (cT2, cN0, cM1) - Signed by Sindy Guadeloupe, MD on 12/22/2020    11/17/2021 -  Chemotherapy   Patient is on Treatment Plan : LUNG SMALL CELL Lurbinectedin q21d       ALLERGIES:  is allergic to augmentin [amoxicillin-pot clavulanate], codeine, levaquin [levofloxacin], sulfa antibiotics, vancomycin, and tape.  MEDICATIONS:  Current Outpatient Medications  Medication Sig Dispense Refill   acetaminophen (TYLENOL) 500 MG tablet Take 500 mg by mouth every 6 (six) hours as needed for mild pain or moderate pain.     atorvastatin (LIPITOR) 10 MG tablet Take 1 tablet by mouth daily.     dexamethasone (DECADRON) 4 MG tablet Take 1 tablet (4 mg total) by mouth 2 (two) times daily with a meal. 40 tablet 0   hydrALAZINE (APRESOLINE) 50 MG tablet Take 50 mg by mouth 2 (two) times daily.     ketotifen (ZADITOR) 0.025 % ophthalmic solution Place 1 drop into both eyes 2 (two) times daily as needed.     levothyroxine (SYNTHROID, LEVOTHROID) 50 MCG tablet Take 50 mcg by mouth daily before breakfast.     loperamide (IMODIUM) 2 MG capsule Take 2 mg by mouth as needed for diarrhea or loose stools.     loratadine (CLARITIN) 10 MG tablet Take 10 mg by mouth daily. Reported on 01/22/2016     LORazepam (ATIVAN) 0.5 MG tablet TAKE 1 TABLET(0.5 MG) BY MOUTH EVERY 6 HOURS AS NEEDED FOR ANXIETY 120 tablet 0   losartan-hydrochlorothiazide (HYZAAR) 100-25 MG tablet Take 1 tablet by mouth daily.     magnesium chloride (SLOW-MAG) 64 MG TBEC SR tablet Take 1 tablet (64 mg total) by mouth daily. 30 tablet 0   metoprolol succinate (TOPROL-XL) 50 MG 24 hr tablet Take 50 mg by mouth daily.     nystatin (MYCOSTATIN) 100000 UNIT/ML suspension Take 5 mLs (500,000 Units total) by mouth 4 (four) times daily. 60 mL 1   ondansetron (ZOFRAN) 8 MG tablet Take 1 tablet (8 mg total) by mouth 2 (two) times daily as needed for refractory nausea / vomiting. Start on day 3 after chemotherapy. (Patient not taking: Reported on 12/29/2021) 30 tablet 1   polyethylene glycol (MIRALAX / GLYCOLAX) 17 g packet Take 17 g  by mouth daily as needed for moderate constipation (pt takes it daily for 7 days after each chemo treatment).     potassium chloride SA (KLOR-CON M) 20 MEQ tablet TAKE 1 TABLET(20 MEQ) BY MOUTH DAILY 30 tablet 1   prochlorperazine (COMPAZINE) 10 MG tablet Take 1 tablet (10 mg total) by mouth every 6 (six) hours as needed (Nausea or vomiting). (Patient not taking: Reported on 12/29/2021) 30 tablet 1   QUEtiapine (SEROQUEL) 25 MG tablet Take 1 tablet (25 mg total) by mouth at bedtime. 30 tablet 2   senna-docusate (SENOKOT-S) 8.6-50 MG tablet Take 1 tablet by mouth daily as needed for mild constipation (pt takes the tablet 2 a dayfor 5 days after chemotherapy treatment).     sodium chloride 1 g tablet Take 1 g by mouth in the morning, at noon, in the  evening, and at bedtime.     TRELEGY ELLIPTA 100-62.5-25 MCG/ACT AEPB INHALE 1 PUFF INTO THE LUNGS DAILY 60 each 3   VITAMIN MIXTURE PO Take 1 tablet by mouth in the morning, at noon, and at bedtime. Balance of nature - fruit     VITAMIN MIXTURE PO Take 1 tablet by mouth 3 (three) times daily. 1 tablet by mouth morning, noon, and night Balance of Nature: veggie     No current facility-administered medications for this visit.   Facility-Administered Medications Ordered in Other Visits  Medication Dose Route Frequency Provider Last Rate Last Admin   0.9 %  sodium chloride infusion   Intravenous Once Earlie Server, MD        VITAL SIGNS: There were no vitals taken for this visit. There were no vitals filed for this visit.  Estimated body mass index is 23.15 kg/m as calculated from the following:   Height as of 12/08/21: 5\' 7"  (1.702 m).   Weight as of an earlier encounter on 02/03/22: 147 lb 12.8 oz (67 kg).  LABS: CBC:    Component Value Date/Time   WBC 11.0 (H) 01/19/2022 0919   HGB 9.6 (L) 01/19/2022 0919   HGB 13.8 06/05/2013 1127   HCT 27.7 (L) 01/19/2022 0919   HCT 40.0 06/05/2013 1127   PLT 68 (L) 01/19/2022 0919   PLT 303 06/05/2013 1127    MCV 94.5 01/19/2022 0919   MCV 92 06/05/2013 1127   NEUTROABS 8.5 (H) 01/19/2022 0919   LYMPHSABS 0.8 01/19/2022 0919   MONOABS 0.8 01/19/2022 0919   EOSABS 0.0 01/19/2022 0919   BASOSABS 0.1 01/19/2022 0919   Comprehensive Metabolic Panel:    Component Value Date/Time   NA 128 (L) 01/19/2022 0919   NA 131 (L) 06/05/2013 1127   K 3.1 (L) 01/19/2022 0919   K 3.0 (L) 06/05/2013 1127   CL 101 01/19/2022 0919   CL 96 (L) 06/05/2013 1127   CO2 22 01/19/2022 0919   CO2 26 06/05/2013 1127   BUN 30 (H) 01/19/2022 0919   BUN 10 06/05/2013 1127   CREATININE 1.15 (H) 01/19/2022 0919   CREATININE 0.94 06/05/2013 1127   GLUCOSE 115 (H) 01/19/2022 0919   GLUCOSE 127 (H) 06/05/2013 1127   CALCIUM 8.6 (L) 01/19/2022 0919   CALCIUM 9.1 06/05/2013 1127   AST 23 01/19/2022 0919   ALT 49 (H) 01/19/2022 0919   ALKPHOS 61 01/19/2022 0919   BILITOT 0.6 01/19/2022 0919   PROT 5.8 (L) 01/19/2022 0919   ALBUMIN 3.1 (L) 01/19/2022 0919    RADIOGRAPHIC STUDIES: NM Bone Scan Whole Body  Result Date: 01/27/2022 CLINICAL DATA:  Lung cancer with bone Mets EXAM: NUCLEAR MEDICINE WHOLE BODY BONE SCAN TECHNIQUE: Whole body anterior and posterior images were obtained approximately 3 hours after intravenous injection of radiopharmaceutical. RADIOPHARMACEUTICALS:  21.76 mCi Technetium-8m MDP IV COMPARISON:  CT 01/26/2022, PET CT 10/28/2021, bone scan 10/12/2021 FINDINGS: Focal soft tissue activity at the right antecubital fossa consistent with injection site. Multifocal skeletal uptake consistent with osseous metastatic disease. Small focus of activity in the left femoral shaft, new as compared with the bone scan from February, demonstrated on PET CT from March. Faint right iliac activity at the site of known skeletal metastatic focus, no significant change. Patchy left anterior acetabular activity, less intense as compared with previous bone scan. Bilateral scapular activity near the glenoid, new as compared to the  previous bone scan, demonstrated on the PET CT from March. Mild activity at the  left third rib, new compared to the prior bone scan and PET CT. Faint activity at T10, corresponding to the known metastatic lesion here. IMPRESSION: 1. Multifocal foci of activity, consistent with skeletal metastatic disease with new lesions present when compared to the prior bone scan from February of 2023 but demonstrated on the interval PET CT from March. Possible new left third rib lesion since the PET CT from March. Electronically Signed   By: Donavan Foil M.D.   On: 01/27/2022 18:26   CT CHEST ABDOMEN PELVIS W CONTRAST  Result Date: 01/27/2022 CLINICAL DATA:  Small-cell lung cancer with bone metastases. Restaging. * Tracking Code: BO * EXAM: CT CHEST, ABDOMEN, AND PELVIS WITH CONTRAST TECHNIQUE: Multidetector CT imaging of the chest, abdomen and pelvis was performed following the standard protocol during bolus administration of intravenous contrast. RADIATION DOSE REDUCTION: This exam was performed according to the departmental dose-optimization program which includes automated exposure control, adjustment of the mA and/or kV according to patient size and/or use of iterative reconstruction technique. CONTRAST:  164mL OMNIPAQUE IOHEXOL 300 MG/ML  SOLN COMPARISON:  Head CT 10/28/2021 FINDINGS: CT CHEST FINDINGS Cardiovascular: The heart size is normal. No substantial pericardial effusion. Coronary artery calcification is evident. Mild atherosclerotic calcification is noted in the wall of the thoracic aorta. Right Port-A-Cath tip is positioned at the SVC/RA junction. Mediastinum/Nodes: No mediastinal lymphadenopathy. There is no hilar lymphadenopathy. Tiny hiatal hernia. The esophagus has normal imaging features. There is no axillary lymphadenopathy. Lungs/Pleura: Centrilobular emphsyema noted. 1.1 x 0.9 cm left lower lobe pulmonary nodule has decreased from 1.4 x 1.0 cm previously. Architectural distortion and volume loss in the  left lower lobe in the region of the nodule likely reflects post treatment scarring. No new suspicious pulmonary nodule or mass. No pleural effusion. Musculoskeletal: 10 mm lesion in the T10 vertebral body is new in the interval, potentially related to healing metastatic lesion. CT ABDOMEN PELVIS FINDINGS Hepatobiliary: No suspicious focal abnormality within the liver parenchyma. There is no evidence for gallstones, gallbladder wall thickening, or pericholecystic fluid. No intrahepatic or extrahepatic biliary dilation. Pancreas: Subtle 8 mm nodule in the body of pancreas, corresponding to the hypermetabolic lesion on previous PET-CT. This has decreased from 19 mm on that prior study. 10 mm nodule in the tip of the pancreatic tail today was seen to be hypermetabolic on the previous PET-CT where it measured 2.2 cm. No dilatation of the main duct. No intraparenchymal cyst. No peripancreatic edema. Spleen: No splenomegaly. No focal mass lesion. Adrenals/Urinary Tract: No adrenal nodule or mass. Left kidney mildly atrophic. Right kidney unremarkable. No evidence for hydroureter. Gas in the bladder lumen is presumably secondary to recent instrumentation. Stomach/Bowel: Tiny hiatal hernia. Stomach otherwise unremarkable. Duodenum is normally positioned as is the ligament of Treitz. No small bowel wall thickening. No small bowel dilatation. The terminal ileum is normal. The appendix is normal. No gross colonic mass. No colonic wall thickening. Vascular/Lymphatic: There is moderate atherosclerotic calcification of the abdominal aorta without aneurysm. There is no gastrohepatic or hepatoduodenal ligament lymphadenopathy. No retroperitoneal or mesenteric lymphadenopathy. No pelvic sidewall lymphadenopathy. Reproductive: Hysterectomy.  There is no adnexal mass. Other: No intraperitoneal free fluid. Musculoskeletal: Mixed lytic and sclerotic changes in the right iliac bone along the SI joint are similar to prior. 12 mm sclerotic  lesion in the L4 vertebral body appears new in the interval, also potentially related to healing metastatic disease. IMPRESSION: 1. Interval decrease in size of the left lower lobe pulmonary nodule with post treatment  scarring in the left lower lobe. No findings to suggest new soft tissue metastases in the chest abdomen or pelvis today. 2. Hypermetabolic pancreatic lesion seen on previous PET-CT have decreased in size in the interval. 3. 10 mm lesion in the T10 vertebral body and 12 mm lesion in the L4 vertebral body are new in the interval, potentially related to healing metastatic lesions. 4. Stable appearance of mixed lytic and sclerotic changes in the right iliac bone along the SI joint. 5. Tiny hiatal hernia. 6. Gas in the bladder lumen is presumably secondary to recent instrumentation. 7. Aortic Atherosclerosis (ICD10-I70.0) and Emphysema (ICD10-J43.9). Electronically Signed   By: Misty Stanley M.D.   On: 01/27/2022 12:57   MR Brain W Wo Contrast  Result Date: 01/13/2022 CLINICAL DATA:  Small-cell lung cancer with brain metastases SRS planning EXAM: MRI HEAD WITHOUT AND WITH CONTRAST TECHNIQUE: Multiplanar, multiecho pulse sequences of the brain and surrounding structures were obtained without and with intravenous contrast. CONTRAST:  43mL GADAVIST GADOBUTROL 1 MMOL/ML IV SOLN COMPARISON:  12/24/2021 FINDINGS: Brain: Redemonstrated peripherally enhancing lesion in the left basal ganglia, which measures up to 9 x 7 x 7 mm (series 1033, image 149 and series 15, image 24), previously 6 x 6 x 6 mm when remeasured similarly. This lesion is associated with hemosiderin deposition, likely central hemorrhage. Left inferior frontal gyrus lesion measures 5 x 5 x 4 mm (series 1033, image 141 and series 15, image 32), unchanged. These lesions are associated with mildly increased T2 hyperintense signal. Previously noted focus of enhancement in the right cerebellum is no longer seen, likely treatment effect. New small  foci of enhancement are noted in the inferior left frontal lobe (series 1033, image 149), left thalamus (series 1033, image 144), and left hippocampus (series 1033, image 140). Possible faint enhancing focus in the posterior left frontal lobe (series 1033, image 195) and in the right inferior pons (series 1033, image 109 and series 1034, image 104). No restricted diffusion to suggest acute or subacute infarct. No acute hemorrhage, mass effect, or midline shift. No hydrocephalus or extra-axial collection. Vascular: Patent arterial flow voids. Skull and upper cervical spine: Normal marrow signal. Small T1 and T2 hyperintense lesions in the calvarium, consistent with benign hemangiomas. Sinuses/Orbits: No acute finding. Other: Fluid in the bilateral mastoid air cells. IMPRESSION: 1. New small foci of enhancement in the inferior left frontal lobe, left thalamus and left hippocampus, with additional possible lesions in the posterior left frontal lobe and right inferior pons, concerning for additional sites of metastatic disease. 2. Redemonstrated previously noted lesions in the left basal ganglia and left inferior frontal gyrus, with slight increase in the size of the left basal ganglia lesion. 3. Previously noted enhancing focus in the right cerebellum is no longer seen, likely treatment effect. Electronically Signed   By: Merilyn Baba M.D.   On: 01/13/2022 21:03    PERFORMANCE STATUS (ECOG) : 1 - Symptomatic but completely ambulatory  Review of Systems Unless otherwise noted, a complete review of systems is negative.  Physical Exam General: NAD Pulmonary: Unlabored Extremities: no edema, no joint deformities Skin: no rashes Neurological: Weakness but otherwise nonfocal  IMPRESSION: Patient was an add-on to my clinic schedule today at Dr. Elroy Channel request.  Unfortunately, patient is doing rather poorly.  She has progressively declining performance status.  Patient is now mostly better chair bound and is  requiring maximum assistance from family with ADLs.  She has been receiving XRT, which patient feels is helping and  patient is strongly committed to continuing treatments.  I presented the option of hospice care, which patient did not seem at all receptive to discussing.  We will consult home health and social work.  Patient is already followed by community palliative care.  PLAN: -Continue current scope of treatment -ACP/MOST form previously reviewed -Referrals to SW and home health -Follow-up telephone later this week  Case and plan discussed with Dr. Janese Banks  Patient expressed understanding and was in agreement with this plan. She also understands that She can call the clinic at any time with any questions, concerns, or complaints.     Time Total: 15 minutes  Visit consisted of counseling and education dealing with the complex and emotionally intense issues of symptom management and palliative care in the setting of serious and potentially life-threatening illness.Greater than 50%  of this time was spent counseling and coordinating care related to the above assessment and plan.  Signed by: Altha Harm, PhD, NP-C

## 2022-02-04 ENCOUNTER — Encounter: Payer: Self-pay | Admitting: Oncology

## 2022-02-04 ENCOUNTER — Other Ambulatory Visit: Payer: Self-pay

## 2022-02-04 ENCOUNTER — Ambulatory Visit
Admission: RE | Admit: 2022-02-04 | Discharge: 2022-02-04 | Disposition: A | Discharge status: Home / Self Care | Payer: Medicare Other | Source: Ambulatory Visit | Attending: Radiation Oncology | Admitting: Radiation Oncology

## 2022-02-04 DIAGNOSIS — Z510 Encounter for antineoplastic radiation therapy: Secondary | ICD-10-CM | POA: Diagnosis not present

## 2022-02-04 LAB — RAD ONC ARIA SESSION SUMMARY
Course Elapsed Days: 15
Plan Fractions Treated to Date: 12
Plan Prescribed Dose Per Fraction: 2 Gy
Plan Total Fractions Prescribed: 15
Plan Total Prescribed Dose: 30 Gy
Reference Point Dosage Given to Date: 24 Gy
Reference Point Session Dosage Given: 2 Gy
Session Number: 12

## 2022-02-05 ENCOUNTER — Ambulatory Visit: Payer: Medicare Other

## 2022-02-05 ENCOUNTER — Encounter: Payer: Self-pay | Admitting: *Deleted

## 2022-02-05 ENCOUNTER — Telehealth: Payer: Self-pay | Admitting: *Deleted

## 2022-02-05 ENCOUNTER — Inpatient Hospital Stay (HOSPITAL_BASED_OUTPATIENT_CLINIC_OR_DEPARTMENT_OTHER): Payer: Medicare Other | Admitting: Hospice and Palliative Medicine

## 2022-02-05 DIAGNOSIS — C3490 Malignant neoplasm of unspecified part of unspecified bronchus or lung: Secondary | ICD-10-CM

## 2022-02-05 NOTE — Telephone Encounter (Signed)
Merrily Pew will be talking to them today

## 2022-02-05 NOTE — Telephone Encounter (Signed)
Attempted to contact patient's daughter by phone. Left voicemail and will follow up with daughter/family this afternoon.  Maryjean Morn, MSW, LCSW, OSW-C Clinical Social Worker [facility] [phone]

## 2022-02-05 NOTE — Progress Notes (Signed)
[place] Work  Clinical Social Work was referred by medical provider for assessment of psychosocial needs.  Clinical Social Worker patient's daughter, Lexine Baton, by phone to offer support and assess for needs.  Mrs. Rozzell has a strong family, community support system. She is supported by her spouse Fritz Pickerel, son, daughter, and additional supports.  Mrs. Lehnen has expressed desire to receive treatment as long as possible. Lexine Baton shared they are currently deciding on "next steps" and that chemotherapy or radiation may not be an option at this time. CSW and patient's daughter discussed Hospice support in the home and it's practical/emotional benefits.  Patient and family plan to talk further with Medical Oncologist this evening and [facility] palliative care NP on Monday.  CSW will follow up with patient/family.    Gwinda Maine, LCSW  Clinical Social Worker [facility]

## 2022-02-05 NOTE — Progress Notes (Signed)
I called and spoke with patient's daughter.  Family seem to recognize that patient is declining and may be nearing end-of-life.  Patient has been reluctant to discuss these issues and decision-making even with family.  Family are in need of more resources at home.  We again discussed the option of hospice.  Daughter plans to again talk with patient and family about goals.

## 2022-02-07 ENCOUNTER — Other Ambulatory Visit: Payer: Self-pay | Admitting: Oncology

## 2022-02-08 ENCOUNTER — Encounter: Payer: Self-pay | Admitting: Nurse Practitioner

## 2022-02-08 ENCOUNTER — Other Ambulatory Visit: Payer: Medicare Other | Admitting: Nurse Practitioner

## 2022-02-08 ENCOUNTER — Encounter: Payer: Self-pay | Admitting: Oncology

## 2022-02-08 ENCOUNTER — Ambulatory Visit: Payer: Medicare Other

## 2022-02-08 DIAGNOSIS — C3490 Malignant neoplasm of unspecified part of unspecified bronchus or lung: Secondary | ICD-10-CM

## 2022-02-08 DIAGNOSIS — R0602 Shortness of breath: Secondary | ICD-10-CM

## 2022-02-08 DIAGNOSIS — R531 Weakness: Secondary | ICD-10-CM

## 2022-02-08 DIAGNOSIS — Z515 Encounter for palliative care: Secondary | ICD-10-CM

## 2022-02-08 MED FILL — Dexamethasone Sodium Phosphate Inj 100 MG/10ML: INTRAMUSCULAR | Qty: 1 | Status: AC

## 2022-02-08 NOTE — Progress Notes (Signed)
[facility] Palliative Care Consult Note Telephone: [phone]  Fax: [phone]    Date of encounter: 02/08/22 6:58 PM PATIENT NAME: Yannis Gumbs [address] [place] [address]   [phone] (home) [phone] (work) DOB: February 21, 1946 MRN: [id number] PRIMARY CARE PROVIDER:    Idelle Crouch, MD,  [address] [place] [address] [phone]  RESPONSIBLE PARTY:    Contact Information     Name Relation Home Work Mobile   cole,nikki Daughter [phone]   BRALEY, LUCKENBAUGH Spouse [phone]  [phone]   Terecia, [employer] Son  [phone]      I met face to face with patient and family in home. Palliative Care was asked to follow this patient by consultation request of  Sparks, Leonie Douglas, MD to address advance care planning and complex medical decision making. This is a follow up visit.                                  ASSESSMENT AND PLAN / RECOMMENDATIONS:  Symptom Management/Plan: 1. Advance Care Planning;  Reviewed; Lengthy discussion of goc, medical wishes. We talked about recent conversation with Dr Janese Banks, recommendations. We talked about aggressive vs comfort care. Ms. Francesconi wishes are comfort care at home. We talked about option of Hospice benefit under Medicare. We talked about what services are provided. Ms. Planck and family in agreement to proceed with hospice. Notified Dr Janese Banks and order received.   2. Generalized weakness secondary to small cell lung cancer, discussed energy conservation; rest. requires family to now transfer to w/c though she is not able to sit up long before wishing to lie down.   3. Shortness of breath secondary to [place]; discussed inhaler therapy, with hospice will likely place nebulizer and O2 ordered. We talked about monitoring respiratory.   4. Palliative care encounter; Palliative care encounter; Palliative  medicine team will continue to support patient, patient's family, and medical team. Visit consisted of counseling and education dealing with the complex and emotionally intense issues of symptom management and palliative care in the setting of serious and potentially life-threatening illness  I spent 62 minutes providing this consultation. More than 50% of the time in this consultation was spent in counseling and care coordination. PPS: 40% Chief Complaint: Follow up palliative consult for complex medical decision making, address goals, manage ongoing symptoms  HISTORY OF PRESENT ILLNESS:  Kiasia Chou is a 76 y.o. 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 I connected with Ms. Jimmye Norman, Mr Wynder and daughter Lexine Baton. We talked past medical history, family history, life review. We talked about ros, symptoms, appetite, nutrition, mobility, transfers, adl's. We talked about chemotherapy, dx cancer. We talked about plan, medical goals including CPR. Ms. Boerner endorses she would like to have CPR, full scope treatment. We talked about role pc in poc. We talked about ros, symptoms, functional ability has declined. I met with Mr Kitch and Lexine Baton first, reviewed plan of care and discussion with Dr Janese Banks, Oncology. We talked about functional changes, some confusion, how to talk about hospice with Ms. Bia.  Ms. Balcerzak in person and son Myrle Sheng by facetime joined discussion. We reviewed ros, symptoms, how she has been feeling, functional and cognitive impairment. We talked about Oncology discussion and Ms. Cambre interpretation of  goc. Ms. [place] expressed no other treatment options. We talked about option of hospice benefit in the home with what services are provided under Medicare. Ms Graciano was in agreement. We talked about the work she did when she retired from an [employer]. We talked about role pc. We talked about process of hospice. Notified Dr Janese Banks, order received, notified hospice. Therapeutic listening, emotional support provided. Contact information provided. Questions answered.   History obtained from review of EMR, discussion with primary team, and interview with family, facility staff/caregiver and/or Ms. Arzola.  I reviewed available labs, medications, imaging, studies and related documents from the EMR.  Records reviewed and summarized above.   ROS 10 point system reviewed all negative except HPI  Physical Exam: Constitutional: NAD General: frail appearing, debilitated, sick, pleasant female EYES: lids intact ENMT: oral mucous membranes moist CV: S1S2, RRR Pulmonary:  clear, decreased throughout, + increased work of breathing, room air Abdomen: soft and non tender MSK: w/c dependent/bed-bound Skin: warm and dry Neuro:  + generalized weakness,  +mild cognitive impairment Psych: non-anxious affect, A and O x 2 Thank you for the opportunity to participate in the care of Ms. Hevener.  The palliative care team will continue to follow. Please call our office at [phone] if we can be of additional assistance.   Allure Greaser Ihor Gully, NP

## 2022-02-09 ENCOUNTER — Inpatient Hospital Stay: Payer: Medicare Other

## 2022-02-09 ENCOUNTER — Inpatient Hospital Stay: Payer: Medicare Other | Admitting: Oncology

## 2022-02-09 ENCOUNTER — Ambulatory Visit: Payer: Medicare Other

## 2022-02-12 ENCOUNTER — Telehealth: Payer: Self-pay | Admitting: *Deleted

## 2022-02-12 NOTE — Telephone Encounter (Signed)
Hospice called asking to change patient Beth Mcmillan to Duo Nebs. Please advise

## 2022-02-16 ENCOUNTER — Telehealth: Payer: Self-pay

## 2022-02-18 ENCOUNTER — Telehealth: Payer: Self-pay

## 2022-02-20 NOTE — Telephone Encounter (Signed)
[employer] has denied pts IPRATROPIUM-ALBUTEROL Solution 0.5/2.5 reached out to hospice to see if they were able to complete. Per Beth Mcmillan at [facility]: pt passed away this morning 03/08/2022 at 3:34am.

## 2022-02-20 DEATH — deceased

## 2022-02-26 ENCOUNTER — Telehealth: Payer: Medicare Other | Admitting: Hospice and Palliative Medicine

## 2022-07-26 IMAGING — XA IR IMAGING GUIDED PORT INSERTION
3 series · 3 of 3 positions shown · non-contrast
Comparison: PET-CT-11/27/2020

INDICATION: Poor venous access. In need of durable intravenous access for
chemotherapy administration.

EXAM:
IMPLANTED PORT A CATH PLACEMENT WITH ULTRASOUND AND FLUOROSCOPIC
GUIDANCE

[Series 1: ir fluoro/shunt/fist · 1 of 1 slices shown]
[im 1/1]
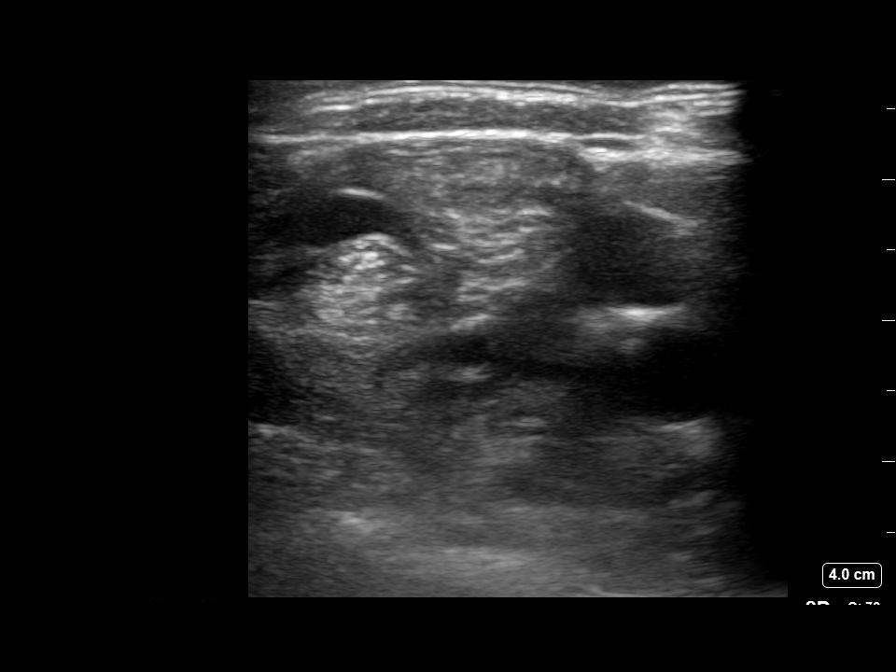

[Series 1: subclavian 3 fps · 1 of 1 slices shown (1 of 2)]
[im 1/1]
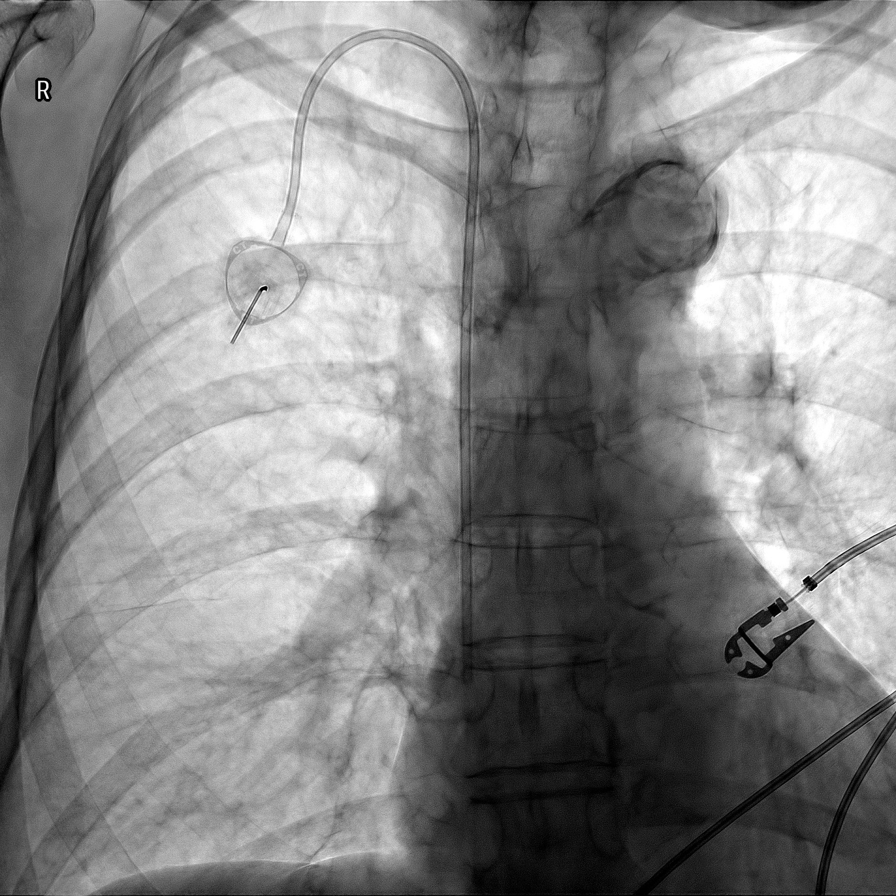

[Series 1: subclavian 3 fps · 1 of 1 slices shown (2 of 2)]
[im 1/1]
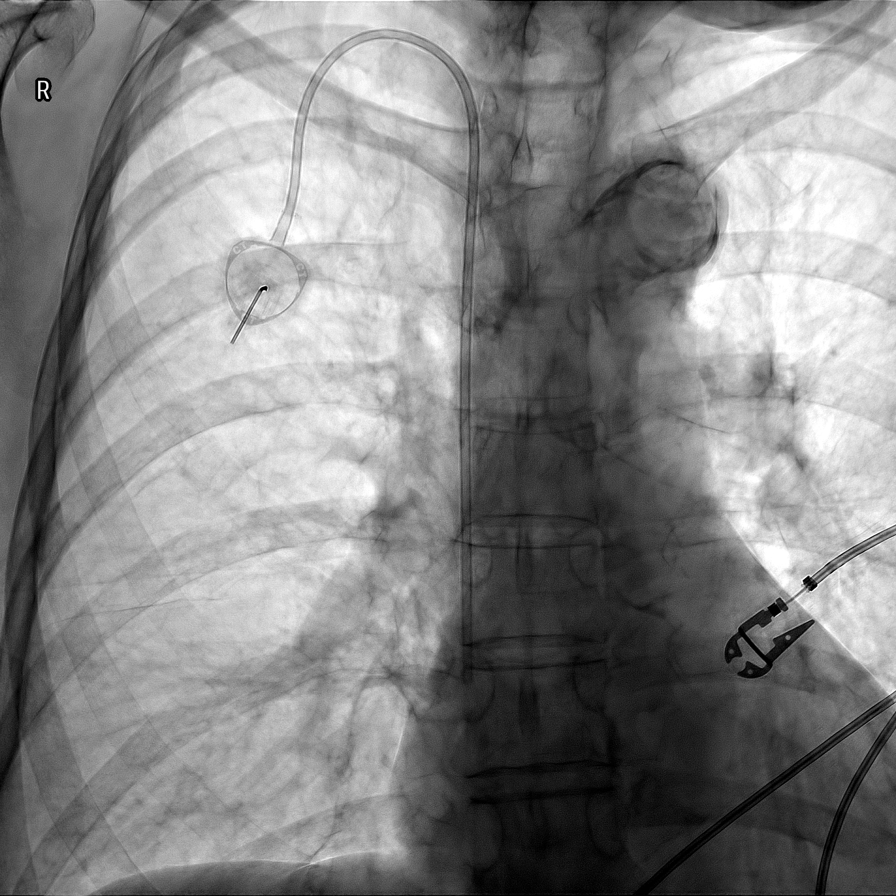

[3 of 3 positions shown; findings below may reference images not displayed]

MEDICATIONS:
None

ANESTHESIA/SEDATION:
Moderate (conscious) sedation was employed during this procedure. A
total of Versed 1 mg and Fentanyl 50 mcg was administered
intravenously.

Moderate Sedation Time: 22 minutes. The patient's level of
consciousness and vital signs were monitored continuously by
radiology nursing throughout the procedure under my direct
supervision.

CONTRAST:  None

FLUOROSCOPY TIME:  1 minute (3.6 mGy)

COMPLICATIONS:
None immediate.

PROCEDURE:
The procedure, risks, benefits, and alternatives were explained to
the patient. Questions regarding the procedure were encouraged and
answered. The patient understands and consents to the procedure.

The right neck and chest were prepped with chlorhexidine in a
sterile fashion, and a sterile drape was applied covering the
operative field. Maximum barrier sterile technique with sterile
gowns and gloves were used for the procedure. A timeout was
performed prior to the initiation of the procedure. Local anesthesia
was provided with 1% lidocaine with epinephrine.

After creating a small venotomy incision, a micropuncture kit was
utilized to access the internal jugular vein. Real-time ultrasound
guidance was utilized for vascular access including the acquisition
of a permanent ultrasound image documenting patency of the accessed
vessel. The microwire was utilized to measure appropriate catheter
length.

A subcutaneous port pocket was then created along the upper chest
wall utilizing a combination of sharp and blunt dissection. The
pocket was irrigated with sterile saline. A single lumen "standard
sized" power injectable port was chosen for placement. The 8 Fr
catheter was tunneled from the port pocket site to the venotomy
incision. The port was placed in the pocket. The external catheter
was trimmed to appropriate length. At the venotomy, an 8 Fr
peel-away sheath was placed over a guidewire under fluoroscopic
guidance. The catheter was then placed through the sheath and the
sheath was removed. Final catheter positioning was confirmed and
documented with a fluoroscopic spot radiograph. The port was
accessed with Gagandeep Sekhon Popandron needle, aspirated and flushed with heparinized
saline.

The venotomy site was closed with an interrupted 4-0 Vicryl suture.
The port pocket incision was closed with interrupted 2-0 Vicryl
suture. Dermabond and Rucus were applied to both incisions.
Dressings were applied. The patient tolerated the procedure well
without immediate post procedural complication.
FINDINGS: After catheter placement, the tip lies within the superior
cavoatrial junction. The catheter aspirates and flushes normally and
is ready for immediate use.
IMPRESSION: Successful placement of a right internal jugular approach power
injectable Port-A-Cath. The catheter is ready for immediate use.

## 2022-09-10 IMAGING — DX DG CHEST 1V PORT
1 series · 1 of 1 positions shown · non-contrast
Comparison: 01/28/2021

CLINICAL DATA: Questionable sepsis

EXAM:
PORTABLE CHEST 1 VIEW

[chest ap]
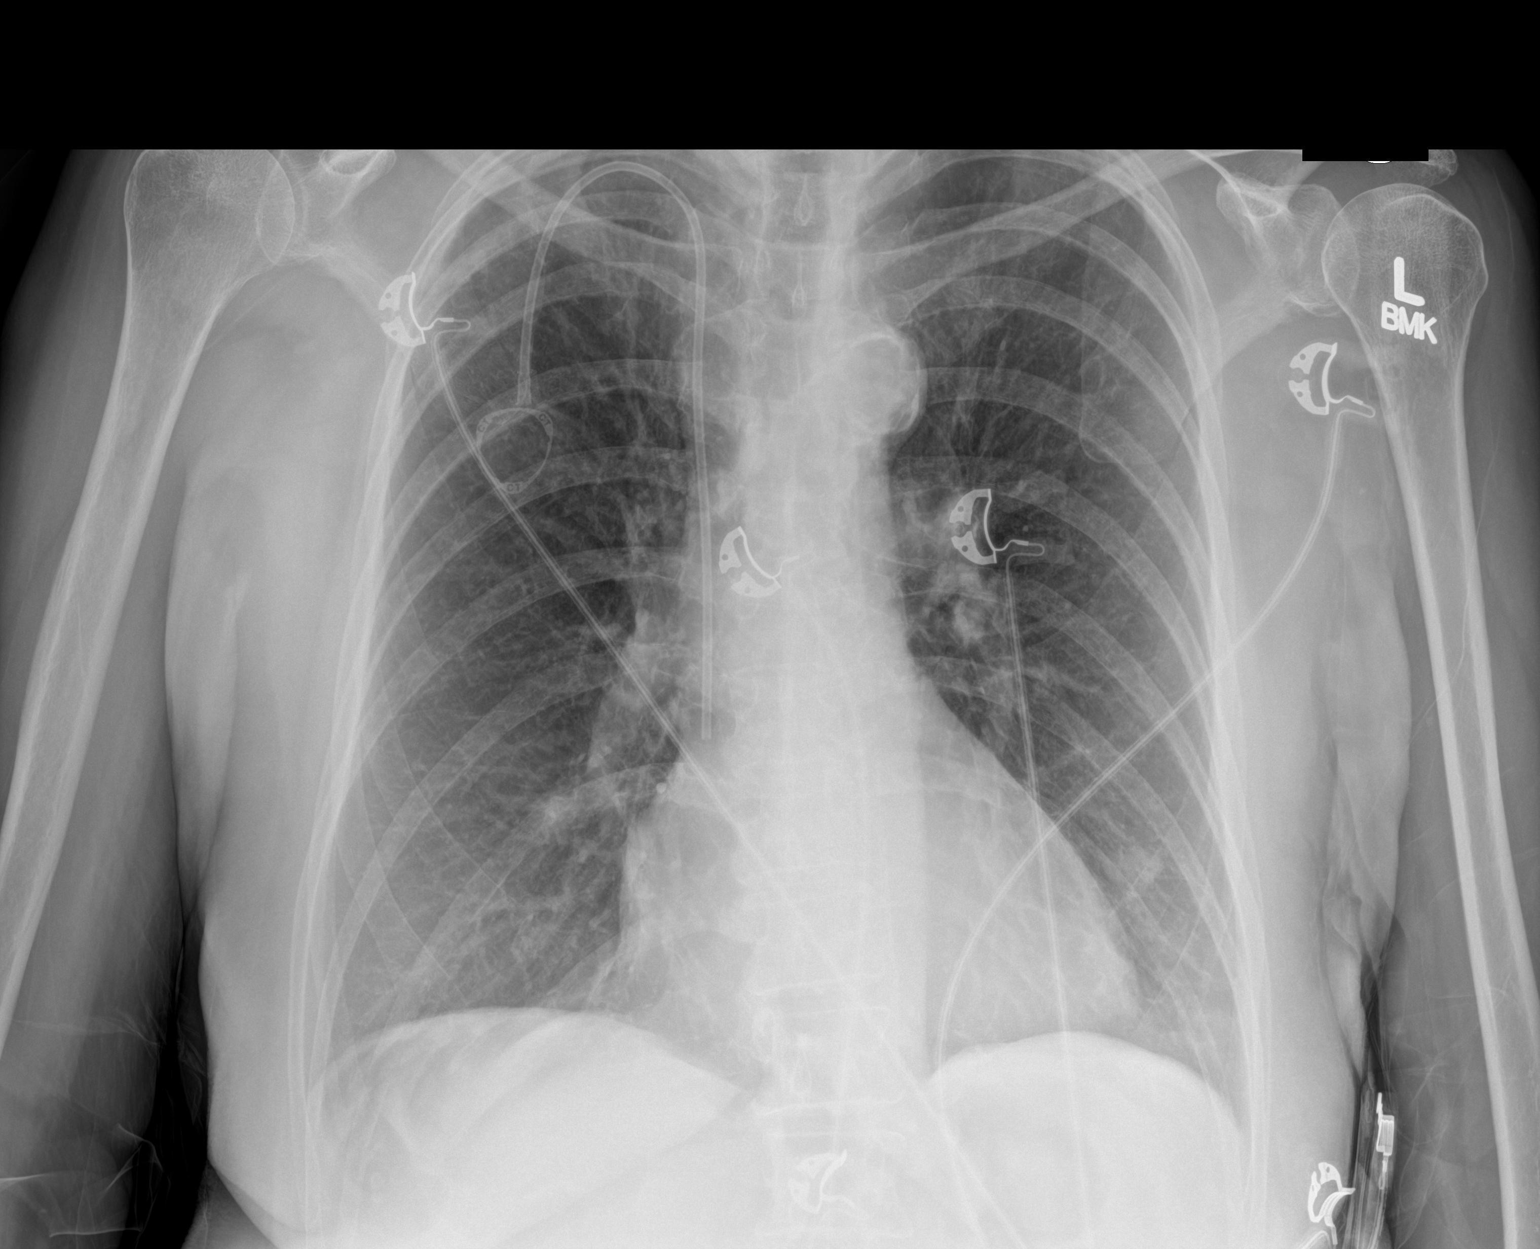

[1 of 1 positions shown; findings below may reference images not displayed]

FINDINGS: Right Port-A-Cath remains in place, unchanged. There is
hyperinflation of the lungs compatible with COPD. Heart is normal
size. Aortic calcifications. No confluent opacities or effusions. No
acute bony abnormality.
IMPRESSION: COPD.  No active disease.

## 2022-09-11 IMAGING — US US EXTREM LOW VENOUS
1 series · 13 of 24 positions shown · non-contrast
Comparison: None.

CLINICAL DATA: 75-year-old female with a history bilateral lower
extremity pain and swelling



[Series 1: us extrem low venous · 0.08mm/px · 13 of 81 slices shown]
[im 1/81]
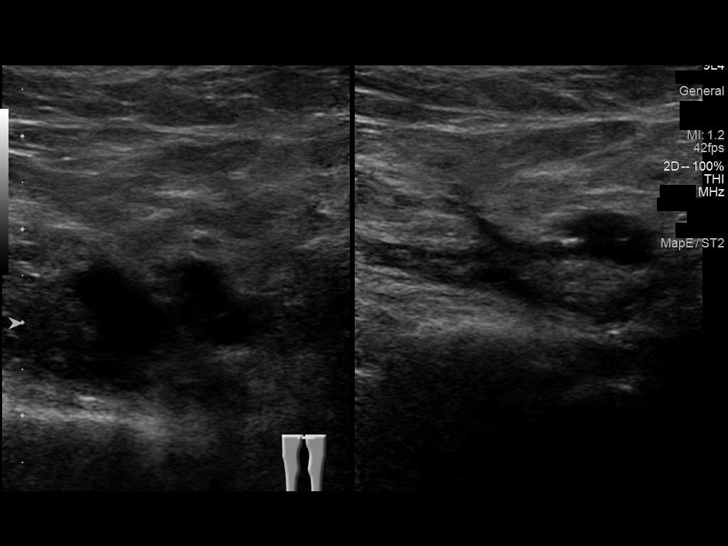
[im 7/81]
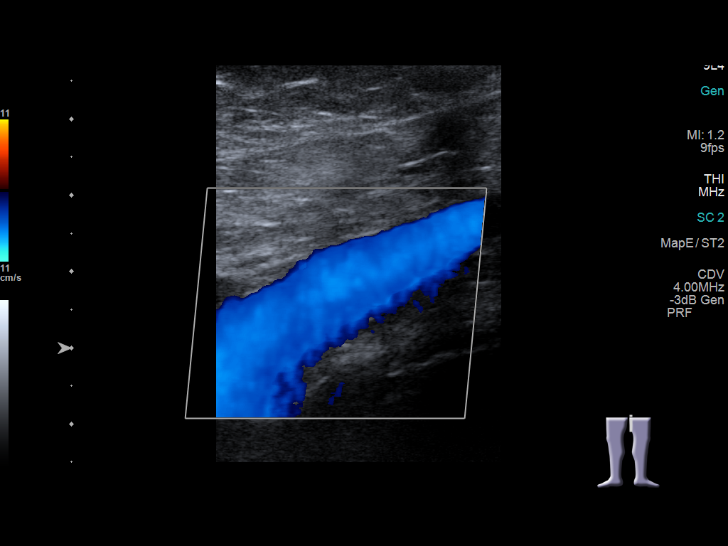
[im 14/81]
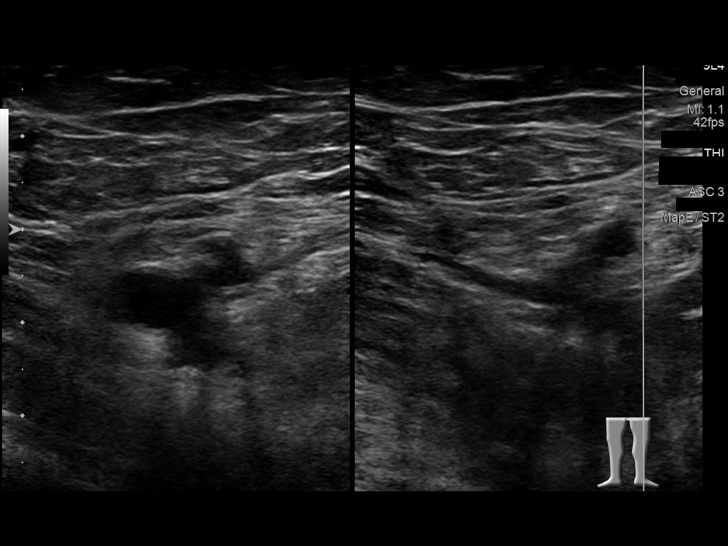
[im 21/81]
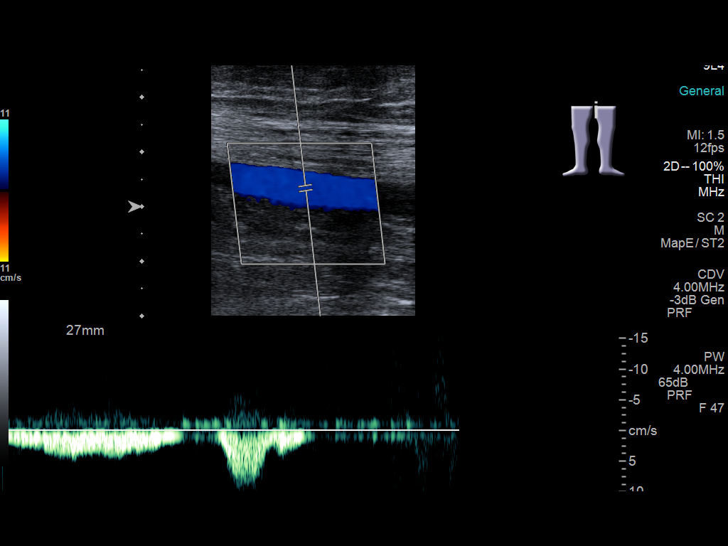
[im 28/81]
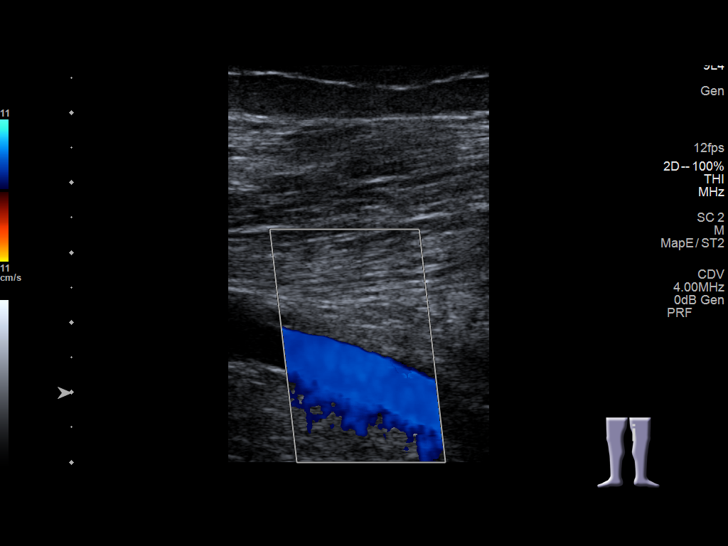
[im 35/81]
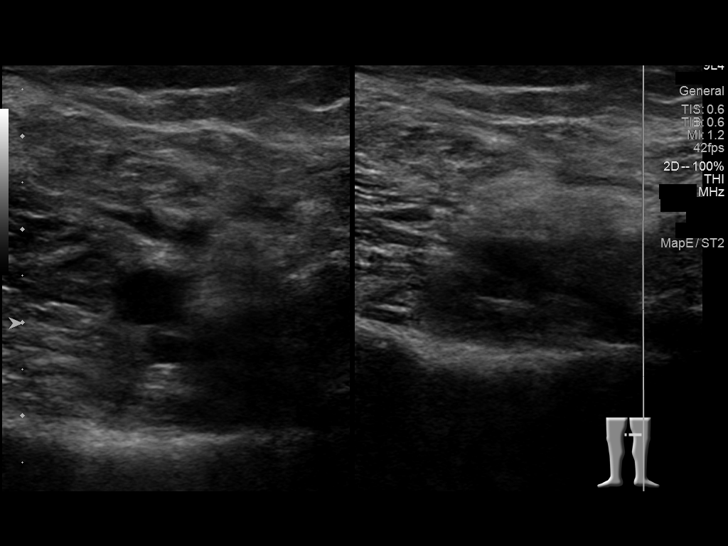
[im 42/81]
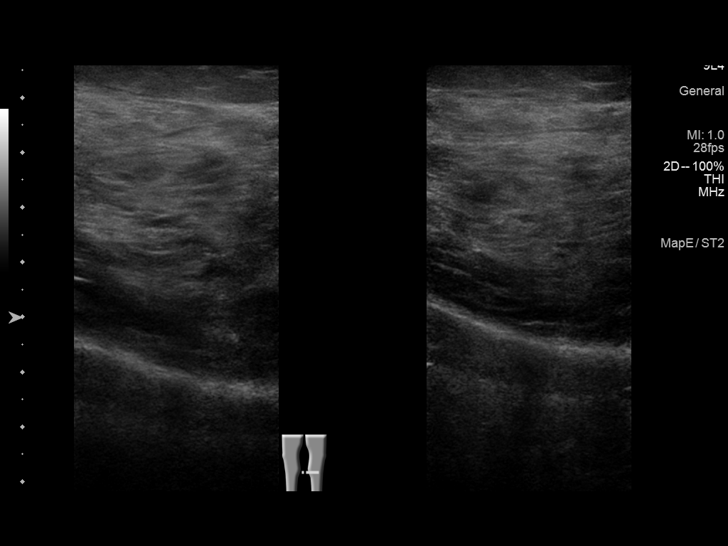
[im 46/81]
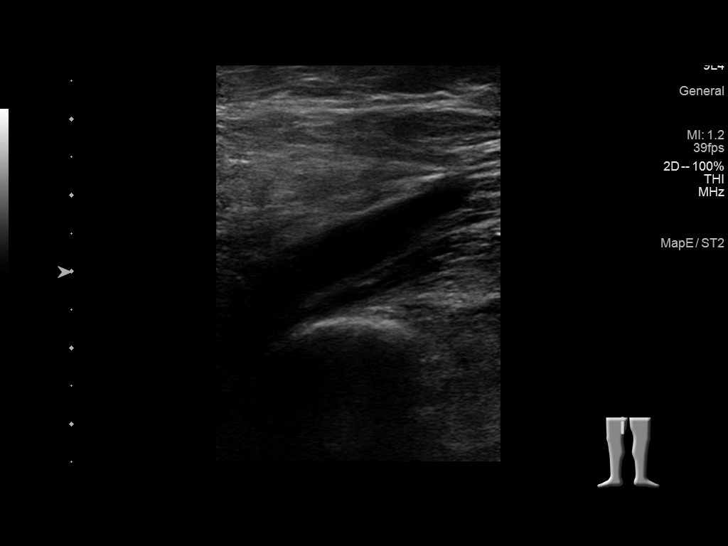
[im 53/81]
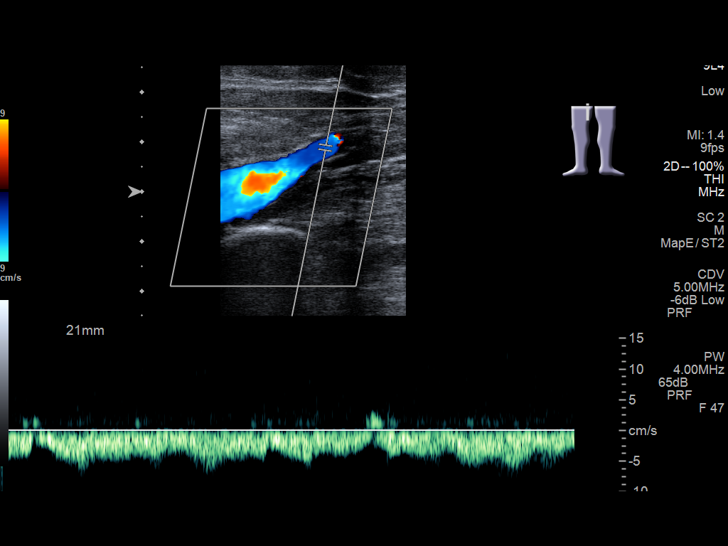
[im 60/81]
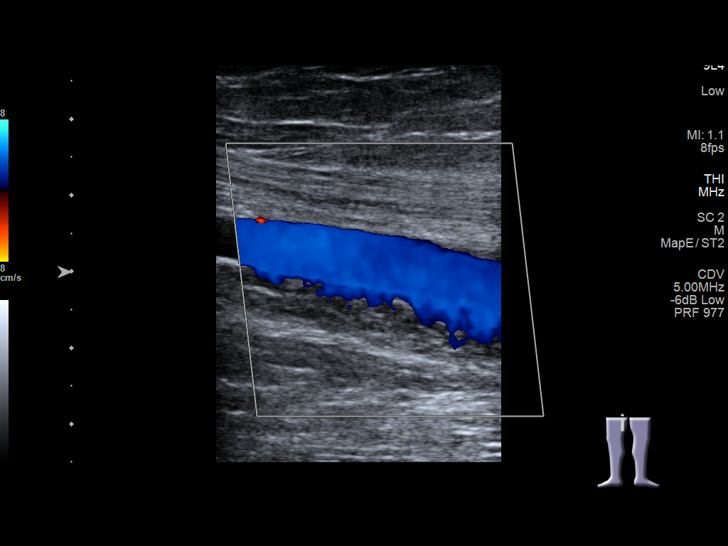
[im 67/81]
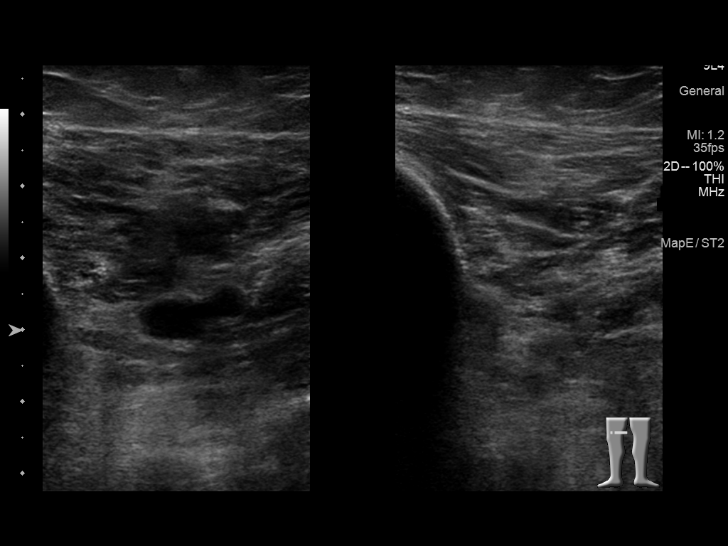
[im 74/81]
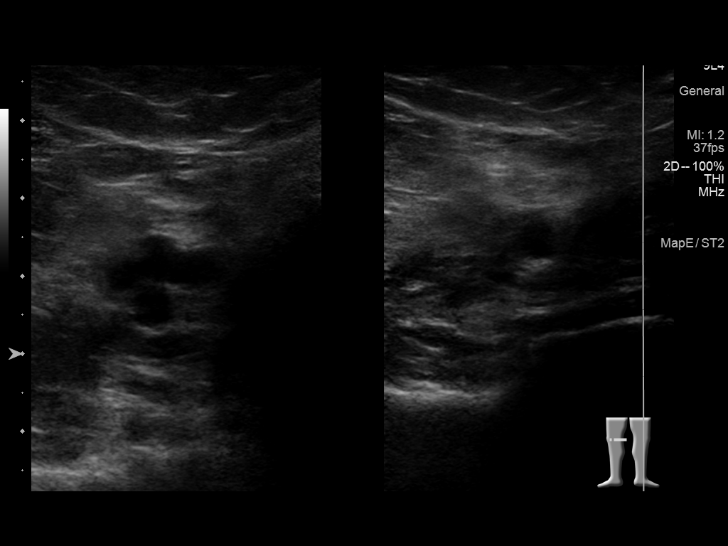
[im 81/81]
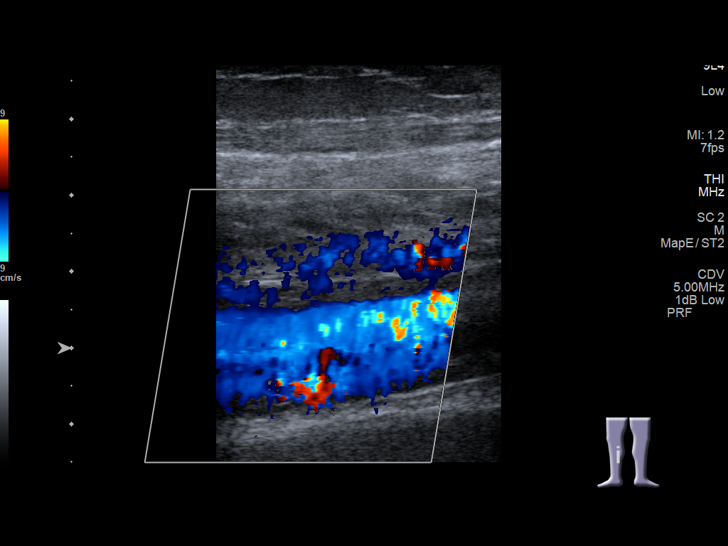

[13 of 24 positions shown; findings below may reference images not displayed]

FINDINGS: RIGHT LOWER EXTREMITY

Common Femoral Vein: No evidence of thrombus. Normal
compressibility, respiratory phasicity and response to augmentation.

Saphenofemoral Junction: No evidence of thrombus. Normal
compressibility and flow on color Doppler imaging.

Profunda Femoral Vein: No evidence of thrombus. Normal
compressibility and flow on color Doppler imaging.

Femoral Vein: No evidence of thrombus. Normal compressibility,
respiratory phasicity and response to augmentation.

Popliteal Vein: No evidence of thrombus. Normal compressibility,
respiratory phasicity and response to augmentation.

Calf Veins: No evidence of thrombus. Normal compressibility and flow
on color Doppler imaging.

Superficial Great Saphenous Vein: No evidence of thrombus. Normal
compressibility and flow on color Doppler imaging.

Other Findings:  None.

LEFT LOWER EXTREMITY

Common Femoral Vein: No evidence of thrombus. Normal
compressibility, respiratory phasicity and response to augmentation.

Saphenofemoral Junction: No evidence of thrombus. Normal
compressibility and flow on color Doppler imaging.

Profunda Femoral Vein: No evidence of thrombus. Normal
compressibility and flow on color Doppler imaging.

Femoral Vein: No evidence of thrombus. Normal compressibility,
respiratory phasicity and response to augmentation.

Popliteal Vein: No evidence of thrombus. Normal compressibility,
respiratory phasicity and response to augmentation.

Calf Veins: No evidence of thrombus. Normal compressibility and flow
on color Doppler imaging.

Superficial Great Saphenous Vein: No evidence of thrombus. Normal
compressibility and flow on color Doppler imaging.

Other Findings:  None.
IMPRESSION: Sonographic survey of the bilateral lower extremities negative for
DVT

## 2023-08-25 NOTE — Telephone Encounter (Signed)
 Error
